# Patient Record
Sex: Male | Born: 1950
Health system: Southern US, Community
[De-identification: ages and names within clinical notes are randomized; demographics above are authoritative.]

## PROBLEM LIST (undated history)

## (undated) DIAGNOSIS — Z860100 Personal history of colon polyps, unspecified: Secondary | ICD-10-CM

## (undated) DIAGNOSIS — D649 Anemia, unspecified: Secondary | ICD-10-CM

## (undated) DIAGNOSIS — C61 Malignant neoplasm of prostate: Secondary | ICD-10-CM

## (undated) DIAGNOSIS — M87180 Osteonecrosis due to drugs, jaw: Secondary | ICD-10-CM

## (undated) DIAGNOSIS — F32A Depression, unspecified: Secondary | ICD-10-CM

## (undated) DIAGNOSIS — I4891 Unspecified atrial fibrillation: Secondary | ICD-10-CM

## (undated) DIAGNOSIS — K7011 Alcoholic hepatitis with ascites: Principal | ICD-10-CM

## (undated) DIAGNOSIS — F1020 Alcohol dependence, uncomplicated: Secondary | ICD-10-CM

## (undated) DIAGNOSIS — K7010 Alcoholic hepatitis without ascites: Secondary | ICD-10-CM

## (undated) DIAGNOSIS — J189 Pneumonia, unspecified organism: Secondary | ICD-10-CM

## (undated) DIAGNOSIS — F329 Major depressive disorder, single episode, unspecified: Secondary | ICD-10-CM

## (undated) DIAGNOSIS — B9620 Unspecified Escherichia coli [E. coli] as the cause of diseases classified elsewhere: Secondary | ICD-10-CM

## (undated) DIAGNOSIS — I499 Cardiac arrhythmia, unspecified: Secondary | ICD-10-CM

## (undated) DIAGNOSIS — K7030 Alcoholic cirrhosis of liver without ascites: Secondary | ICD-10-CM

## (undated) DIAGNOSIS — G250 Essential tremor: Secondary | ICD-10-CM

## (undated) DIAGNOSIS — F411 Generalized anxiety disorder: Secondary | ICD-10-CM

## (undated) DIAGNOSIS — I2510 Atherosclerotic heart disease of native coronary artery without angina pectoris: Secondary | ICD-10-CM

## (undated) DIAGNOSIS — G252 Other specified forms of tremor: Secondary | ICD-10-CM

## (undated) DIAGNOSIS — I10 Essential (primary) hypertension: Secondary | ICD-10-CM

## (undated) DIAGNOSIS — K767 Hepatorenal syndrome: Secondary | ICD-10-CM

## (undated) DIAGNOSIS — E785 Hyperlipidemia, unspecified: Secondary | ICD-10-CM

## (undated) DIAGNOSIS — R7881 Bacteremia: Secondary | ICD-10-CM

## (undated) DIAGNOSIS — R972 Elevated prostate specific antigen [PSA]: Secondary | ICD-10-CM

## (undated) DIAGNOSIS — T458X5A Adverse effect of other primarily systemic and hematological agents, initial encounter: Secondary | ICD-10-CM

## (undated) DIAGNOSIS — Z800 Family history of malignant neoplasm of digestive organs: Secondary | ICD-10-CM

## (undated) HISTORY — DX: Essential (primary) hypertension: I10

## (undated) HISTORY — DX: Hypocalcemia: E83.51

## (undated) HISTORY — DX: Unspecified Escherichia coli (E. coli) as the cause of diseases classified elsewhere: B96.20

## (undated) HISTORY — DX: Alcohol dependence, uncomplicated: F10.20

## (undated) HISTORY — PX: COLONOSCOPY W/ BIOPSIES: SHX1374

## (undated) HISTORY — DX: Bacteremia: R78.81

## (undated) HISTORY — DX: Alcoholic hepatitis with ascites: K70.11

## (undated) HISTORY — PX: VASECTOMY: SHX75

## (undated) HISTORY — DX: Alcoholic hepatitis without ascites: K70.10

## (undated) HISTORY — DX: Personal history of colonic polyps: Z86.010

## (undated) HISTORY — DX: Family history of malignant neoplasm of digestive organs: Z80.0

## (undated) HISTORY — DX: Essential tremor: G25.0

## (undated) HISTORY — DX: Generalized anxiety disorder: F41.1

## (undated) HISTORY — DX: Unspecified atrial fibrillation: I48.91

## (undated) HISTORY — DX: Alcoholic cirrhosis of liver without ascites: K70.30

## (undated) HISTORY — PX: CARDIAC CATHETERIZATION: SHX172

## (undated) HISTORY — DX: Hyperlipidemia, unspecified: E78.5

## (undated) HISTORY — PX: CHOLECYSTECTOMY: SHX55

## (undated) HISTORY — DX: Elevated prostate specific antigen (PSA): R97.20

## (undated) HISTORY — DX: Adverse effect of other primarily systemic and hematological agents, initial encounter: T45.8X5A

## (undated) HISTORY — DX: Personal history of colon polyps, unspecified: Z86.0100

## (undated) HISTORY — DX: Osteonecrosis due to drugs, jaw: M87.180

## (undated) HISTORY — DX: Other specified forms of tremor: G25.2

## (undated) HISTORY — DX: Malignant neoplasm of prostate: C61

## (undated) SURGERY — Surgical Case
Anesthesia: *Unknown

---

## 2001-05-29 ENCOUNTER — Encounter (INDEPENDENT_AMBULATORY_CARE_PROVIDER_SITE_OTHER): Payer: Self-pay | Admitting: Specialist

## 2001-05-29 ENCOUNTER — Inpatient Hospital Stay (HOSPITAL_COMMUNITY): Admission: EM | Admit: 2001-05-29 | Discharge: 2001-06-03 | Payer: Self-pay | Admitting: Emergency Medicine

## 2001-05-29 ENCOUNTER — Encounter: Payer: Self-pay | Admitting: Emergency Medicine

## 2001-05-29 ENCOUNTER — Encounter: Payer: Self-pay | Admitting: *Deleted

## 2001-05-31 ENCOUNTER — Encounter: Payer: Self-pay | Admitting: Surgery

## 2007-10-16 ENCOUNTER — Ambulatory Visit: Payer: Self-pay | Admitting: Gastroenterology

## 2007-10-30 ENCOUNTER — Encounter: Payer: Self-pay | Admitting: Gastroenterology

## 2007-10-30 ENCOUNTER — Ambulatory Visit: Payer: Self-pay | Admitting: Gastroenterology

## 2007-10-30 LAB — CONVERTED CEMR LAB
ALT: 32 units/L (ref 0–53)
AST: 62 units/L — ABNORMAL HIGH (ref 0–37)
Albumin: 3.9 g/dL (ref 3.5–5.2)
Alkaline Phosphatase: 48 units/L (ref 39–117)
Amylase: 50 units/L (ref 27–131)
Basophils Absolute: 0.1 10*3/uL (ref 0.0–0.1)
Basophils Relative: 1 % (ref 0.0–1.0)
CO2: 27 meq/L (ref 19–32)
Calcium: 8.3 mg/dL — ABNORMAL LOW (ref 8.4–10.5)
Chloride: 103 meq/L (ref 96–112)
Eosinophils Absolute: 0.2 10*3/uL (ref 0.0–0.6)
GFR calc Af Amer: 111 mL/min
GFR calc non Af Amer: 92 mL/min
Glucose, Bld: 79 mg/dL (ref 70–99)
HCT: 43 % (ref 39.0–52.0)
Iron: 112 ug/dL (ref 42–165)
Lipase: 21 units/L (ref 11.0–59.0)
Lymphocytes Relative: 18.3 % (ref 12.0–46.0)
Monocytes Absolute: 0.3 10*3/uL (ref 0.2–0.7)
Monocytes Relative: 6.5 % (ref 3.0–11.0)
Neutrophils Relative %: 69.7 % (ref 43.0–77.0)
Platelets: 196 10*3/uL (ref 150–400)
Potassium: 3.7 meq/L (ref 3.5–5.1)
Saturation Ratios: 31.8 % (ref 20.0–50.0)
Sed Rate: 17 mm/hr (ref 0–20)
Sodium: 138 meq/L (ref 135–145)
Total Bilirubin: 1.3 mg/dL — ABNORMAL HIGH (ref 0.3–1.2)
Total Protein: 7 g/dL (ref 6.0–8.3)
Transferrin: 251.5 mg/dL (ref >=212.0)

## 2007-11-06 DIAGNOSIS — C61 Malignant neoplasm of prostate: Secondary | ICD-10-CM

## 2007-11-06 HISTORY — DX: Malignant neoplasm of prostate: C61

## 2007-11-07 ENCOUNTER — Ambulatory Visit: Payer: Self-pay | Admitting: Gastroenterology

## 2007-11-07 LAB — CONVERTED CEMR LAB: HCV Ab: NEGATIVE

## 2007-11-13 ENCOUNTER — Emergency Department (HOSPITAL_COMMUNITY): Admission: EM | Admit: 2007-11-13 | Discharge: 2007-11-13 | Payer: Self-pay | Admitting: Emergency Medicine

## 2007-11-30 ENCOUNTER — Emergency Department (HOSPITAL_COMMUNITY): Admission: EM | Admit: 2007-11-30 | Discharge: 2007-11-30 | Payer: Self-pay | Admitting: Emergency Medicine

## 2007-12-02 ENCOUNTER — Ambulatory Visit: Payer: Self-pay | Admitting: Gastroenterology

## 2007-12-02 ENCOUNTER — Inpatient Hospital Stay (HOSPITAL_COMMUNITY): Admission: EM | Admit: 2007-12-02 | Discharge: 2007-12-05 | Payer: Self-pay | Admitting: Emergency Medicine

## 2007-12-03 ENCOUNTER — Ambulatory Visit: Payer: Self-pay | Admitting: Psychiatry

## 2008-02-10 DIAGNOSIS — I10 Essential (primary) hypertension: Secondary | ICD-10-CM

## 2008-02-11 ENCOUNTER — Ambulatory Visit: Payer: Self-pay | Admitting: Gastroenterology

## 2008-02-11 LAB — CONVERTED CEMR LAB
ALT: 18 units/L (ref 0–53)
AST: 23 units/L (ref 0–37)
Albumin: 3.6 g/dL (ref 3.5–5.2)
Alkaline Phosphatase: 63 units/L (ref 39–117)
Ammonia: 28 umol/L (ref 11–35)
BUN: 15 mg/dL (ref 6–23)
Basophils Relative: 0.7 % (ref 0.0–1.0)
Bilirubin, Direct: 0.1 mg/dL (ref 0.0–0.3)
Creatinine, Ser: 0.9 mg/dL (ref 0.4–1.5)
Eosinophils Relative: 3 % (ref 0.0–5.0)
GFR calc non Af Amer: 92 mL/min
Glucose, Bld: 99 mg/dL (ref 70–99)
Lymphocytes Relative: 19.3 % (ref 12.0–46.0)
Monocytes Relative: 5 % (ref 3.0–12.0)
Neutro Abs: 4.7 10*3/uL (ref 1.4–7.7)
Platelets: 200 10*3/uL (ref 150–400)
Total Bilirubin: 1 mg/dL (ref 0.3–1.2)

## 2008-03-24 ENCOUNTER — Ambulatory Visit: Payer: Self-pay | Admitting: Gastroenterology

## 2008-03-24 ENCOUNTER — Telehealth: Payer: Self-pay | Admitting: Gastroenterology

## 2008-03-24 LAB — CONVERTED CEMR LAB
AST: 26 units/L (ref 0–37)
Alkaline Phosphatase: 63 units/L (ref 39–117)
Total Bilirubin: 0.9 mg/dL (ref 0.3–1.2)
Total Protein: 7.3 g/dL (ref 6.0–8.3)

## 2008-05-05 ENCOUNTER — Ambulatory Visit: Payer: Self-pay | Admitting: Gastroenterology

## 2008-05-05 LAB — CONVERTED CEMR LAB: PSA: 52.04 ng/mL — ABNORMAL HIGH (ref 0.10–4.00)

## 2008-05-06 ENCOUNTER — Encounter: Payer: Self-pay | Admitting: Gastroenterology

## 2008-05-12 ENCOUNTER — Ambulatory Visit (HOSPITAL_COMMUNITY): Admission: RE | Admit: 2008-05-12 | Discharge: 2008-05-12 | Payer: Self-pay | Admitting: Gastroenterology

## 2008-05-29 ENCOUNTER — Ambulatory Visit: Payer: Self-pay | Admitting: Gastroenterology

## 2008-05-29 ENCOUNTER — Telehealth: Payer: Self-pay | Admitting: Gastroenterology

## 2008-06-12 ENCOUNTER — Encounter: Payer: Self-pay | Admitting: Gastroenterology

## 2008-06-16 ENCOUNTER — Ambulatory Visit: Payer: Self-pay | Admitting: Gastroenterology

## 2008-06-23 ENCOUNTER — Encounter: Payer: Self-pay | Admitting: Gastroenterology

## 2008-06-26 ENCOUNTER — Encounter: Payer: Self-pay | Admitting: Gastroenterology

## 2008-06-26 ENCOUNTER — Ambulatory Visit (HOSPITAL_COMMUNITY): Admission: RE | Admit: 2008-06-26 | Discharge: 2008-06-26 | Payer: Self-pay | Admitting: Urology

## 2008-06-29 ENCOUNTER — Encounter: Payer: Self-pay | Admitting: Gastroenterology

## 2008-07-01 ENCOUNTER — Ambulatory Visit (HOSPITAL_COMMUNITY): Admission: RE | Admit: 2008-07-01 | Discharge: 2008-07-01 | Payer: Self-pay | Admitting: Urology

## 2008-07-06 ENCOUNTER — Encounter: Payer: Self-pay | Admitting: Gastroenterology

## 2008-07-07 ENCOUNTER — Encounter: Payer: Self-pay | Admitting: Gastroenterology

## 2008-07-08 ENCOUNTER — Telehealth: Payer: Self-pay | Admitting: Internal Medicine

## 2008-07-14 ENCOUNTER — Ambulatory Visit: Payer: Self-pay | Admitting: Oncology

## 2008-07-14 ENCOUNTER — Telehealth (INDEPENDENT_AMBULATORY_CARE_PROVIDER_SITE_OTHER): Payer: Self-pay | Admitting: *Deleted

## 2008-07-20 ENCOUNTER — Encounter: Payer: Self-pay | Admitting: Gastroenterology

## 2008-07-20 LAB — CBC WITH DIFFERENTIAL/PLATELET
BASO%: 0.7 % (ref 0.0–2.0)
EOS%: 7 % (ref 0.0–7.0)
HCT: 38.7 % (ref 38.7–49.9)
MCH: 34.2 pg — ABNORMAL HIGH (ref 28.0–33.4)
MCHC: 35.4 g/dL (ref 32.0–35.9)
MONO#: 0.6 10*3/uL (ref 0.1–0.9)
NEUT%: 57.6 % (ref 40.0–75.0)
RDW: 13.1 % (ref 11.2–14.6)
WBC: 7.6 10*3/uL (ref 4.0–10.0)
lymph#: 2.1 10*3/uL (ref 0.9–3.3)

## 2008-07-20 LAB — COMPREHENSIVE METABOLIC PANEL
ALT: 22 U/L (ref 0–53)
AST: 26 U/L (ref 0–37)
Alkaline Phosphatase: 66 U/L (ref 39–117)
CO2: 22 mEq/L (ref 19–32)
Creatinine, Ser: 0.91 mg/dL (ref 0.40–1.50)
Sodium: 142 mEq/L (ref 135–145)
Total Bilirubin: 0.5 mg/dL (ref 0.3–1.2)

## 2008-07-20 LAB — LACTATE DEHYDROGENASE: LDH: 179 U/L (ref 94–250)

## 2008-08-10 ENCOUNTER — Encounter: Payer: Self-pay | Admitting: Gastroenterology

## 2008-08-12 ENCOUNTER — Encounter (INDEPENDENT_AMBULATORY_CARE_PROVIDER_SITE_OTHER): Payer: Self-pay | Admitting: *Deleted

## 2008-08-14 ENCOUNTER — Ambulatory Visit: Payer: Self-pay | Admitting: Gastroenterology

## 2008-09-11 ENCOUNTER — Ambulatory Visit: Payer: Self-pay | Admitting: Oncology

## 2008-10-02 ENCOUNTER — Encounter: Payer: Self-pay | Admitting: Gastroenterology

## 2008-10-02 LAB — COMPREHENSIVE METABOLIC PANEL
Albumin: 4.2 g/dL (ref 3.5–5.2)
Alkaline Phosphatase: 57 U/L (ref 39–117)
BUN: 17 mg/dL (ref 6–23)
CO2: 24 mEq/L (ref 19–32)
Glucose, Bld: 98 mg/dL (ref 70–99)
Potassium: 3.7 mEq/L (ref 3.5–5.3)
Total Bilirubin: 0.8 mg/dL (ref 0.3–1.2)
Total Protein: 7.5 g/dL (ref 6.0–8.3)

## 2008-10-02 LAB — CBC WITH DIFFERENTIAL/PLATELET
Basophils Absolute: 0 10*3/uL (ref 0.0–0.1)
Eosinophils Absolute: 0.3 10*3/uL (ref 0.0–0.5)
HGB: 13.9 g/dL (ref 13.0–17.1)
LYMPH%: 31.7 % (ref 14.0–49.0)
MCV: 97.1 fL (ref 79.3–98.0)
MONO#: 0.5 10*3/uL (ref 0.1–0.9)
MONO%: 7.9 % (ref 0.0–14.0)
NEUT#: 3.3 10*3/uL (ref 1.5–6.5)
Platelets: 223 10*3/uL (ref 140–400)
WBC: 6 10*3/uL (ref 4.0–10.3)

## 2008-10-02 LAB — LACTATE DEHYDROGENASE: LDH: 162 U/L (ref 94–250)

## 2008-10-03 LAB — PSA: PSA: 0.62 ng/mL (ref 0.10–4.00)

## 2008-10-05 ENCOUNTER — Encounter: Payer: Self-pay | Admitting: Gastroenterology

## 2008-10-15 ENCOUNTER — Ambulatory Visit: Payer: Self-pay | Admitting: Gastroenterology

## 2008-10-16 ENCOUNTER — Telehealth (INDEPENDENT_AMBULATORY_CARE_PROVIDER_SITE_OTHER): Payer: Self-pay | Admitting: *Deleted

## 2008-10-20 ENCOUNTER — Encounter (INDEPENDENT_AMBULATORY_CARE_PROVIDER_SITE_OTHER): Payer: Self-pay | Admitting: *Deleted

## 2008-12-08 ENCOUNTER — Encounter: Payer: Self-pay | Admitting: Gastroenterology

## 2008-12-24 ENCOUNTER — Encounter: Payer: Self-pay | Admitting: Gastroenterology

## 2008-12-29 ENCOUNTER — Ambulatory Visit: Payer: Self-pay | Admitting: Cardiovascular Disease

## 2008-12-29 ENCOUNTER — Ambulatory Visit: Payer: Self-pay | Admitting: Gastroenterology

## 2008-12-29 ENCOUNTER — Emergency Department (HOSPITAL_COMMUNITY): Admission: EM | Admit: 2008-12-29 | Discharge: 2008-12-29 | Payer: Self-pay | Admitting: Emergency Medicine

## 2008-12-29 DIAGNOSIS — I4891 Unspecified atrial fibrillation: Secondary | ICD-10-CM | POA: Insufficient documentation

## 2009-01-11 ENCOUNTER — Ambulatory Visit: Payer: Self-pay | Admitting: Cardiovascular Disease

## 2009-01-11 ENCOUNTER — Ambulatory Visit: Payer: Self-pay | Admitting: Cardiology

## 2009-01-11 ENCOUNTER — Inpatient Hospital Stay (HOSPITAL_COMMUNITY): Admission: EM | Admit: 2009-01-11 | Discharge: 2009-01-15 | Payer: Self-pay | Admitting: Emergency Medicine

## 2009-01-11 ENCOUNTER — Ambulatory Visit: Payer: Self-pay

## 2009-01-12 ENCOUNTER — Encounter: Payer: Self-pay | Admitting: Cardiology

## 2009-01-13 ENCOUNTER — Encounter: Payer: Self-pay | Admitting: Cardiology

## 2009-01-18 ENCOUNTER — Ambulatory Visit: Payer: Self-pay | Admitting: Cardiology

## 2009-01-21 ENCOUNTER — Ambulatory Visit: Payer: Self-pay | Admitting: Internal Medicine

## 2009-01-21 LAB — CONVERTED CEMR LAB
POC INR: 3
Protime: 20.8

## 2009-01-22 DIAGNOSIS — E785 Hyperlipidemia, unspecified: Secondary | ICD-10-CM | POA: Insufficient documentation

## 2009-01-27 ENCOUNTER — Encounter: Payer: Self-pay | Admitting: Gastroenterology

## 2009-02-02 ENCOUNTER — Ambulatory Visit: Payer: Self-pay | Admitting: Oncology

## 2009-02-02 LAB — MORPHOLOGY

## 2009-02-03 ENCOUNTER — Ambulatory Visit: Payer: Self-pay | Admitting: Internal Medicine

## 2009-02-03 ENCOUNTER — Encounter: Payer: Self-pay | Admitting: Nurse Practitioner

## 2009-02-11 ENCOUNTER — Ambulatory Visit: Payer: Self-pay | Admitting: Internal Medicine

## 2009-02-11 LAB — CONVERTED CEMR LAB
POC INR: 7.9
Prothrombin Time: 33.8 s

## 2009-02-12 ENCOUNTER — Encounter: Payer: Self-pay | Admitting: Gastroenterology

## 2009-02-16 ENCOUNTER — Encounter (INDEPENDENT_AMBULATORY_CARE_PROVIDER_SITE_OTHER): Payer: Self-pay | Admitting: Cardiology

## 2009-02-16 ENCOUNTER — Ambulatory Visit: Payer: Self-pay | Admitting: Cardiology

## 2009-02-16 LAB — CONVERTED CEMR LAB

## 2009-03-05 ENCOUNTER — Telehealth: Payer: Self-pay | Admitting: Gastroenterology

## 2009-03-08 ENCOUNTER — Ambulatory Visit: Payer: Self-pay | Admitting: Internal Medicine

## 2009-03-08 LAB — CONVERTED CEMR LAB: Prothrombin Time: 33.9 s

## 2009-03-15 ENCOUNTER — Ambulatory Visit: Payer: Self-pay | Admitting: Cardiology

## 2009-03-15 ENCOUNTER — Encounter (INDEPENDENT_AMBULATORY_CARE_PROVIDER_SITE_OTHER): Payer: Self-pay | Admitting: Cardiology

## 2009-03-23 ENCOUNTER — Encounter: Payer: Self-pay | Admitting: Gastroenterology

## 2009-03-23 ENCOUNTER — Encounter: Payer: Self-pay | Admitting: Cardiology

## 2009-03-24 ENCOUNTER — Ambulatory Visit: Payer: Self-pay | Admitting: Cardiology

## 2009-03-25 ENCOUNTER — Ambulatory Visit: Payer: Self-pay | Admitting: Gastroenterology

## 2009-03-26 LAB — CONVERTED CEMR LAB
Ferritin: 504.8 ng/mL — ABNORMAL HIGH (ref 22.0–322.0)
Folate: >20 ng/mL
Iron: 79 ug/dL (ref 42–165)
Saturation Ratios: 22.3 % (ref 20.0–50.0)

## 2009-04-05 ENCOUNTER — Ambulatory Visit: Payer: Self-pay | Admitting: Gastroenterology

## 2009-04-05 LAB — CONVERTED CEMR LAB: Fecal Occult Bld: NEGATIVE

## 2009-06-03 ENCOUNTER — Ambulatory Visit (HOSPITAL_COMMUNITY): Admission: RE | Admit: 2009-06-03 | Discharge: 2009-06-03 | Payer: Self-pay | Admitting: Urology

## 2009-06-08 ENCOUNTER — Encounter: Payer: Self-pay | Admitting: Cardiology

## 2009-06-08 ENCOUNTER — Encounter: Payer: Self-pay | Admitting: Gastroenterology

## 2009-06-09 ENCOUNTER — Ambulatory Visit: Payer: Self-pay | Admitting: Cardiology

## 2009-06-23 ENCOUNTER — Ambulatory Visit: Payer: Self-pay | Admitting: Oncology

## 2009-06-25 ENCOUNTER — Encounter: Payer: Self-pay | Admitting: Gastroenterology

## 2009-07-23 ENCOUNTER — Telehealth (INDEPENDENT_AMBULATORY_CARE_PROVIDER_SITE_OTHER): Payer: Self-pay | Admitting: *Deleted

## 2009-08-07 HISTORY — PX: CORONARY STENT PLACEMENT: SHX1402

## 2009-09-15 ENCOUNTER — Encounter: Payer: Self-pay | Admitting: Cardiology

## 2009-09-15 ENCOUNTER — Encounter: Payer: Self-pay | Admitting: Gastroenterology

## 2009-09-21 ENCOUNTER — Telehealth: Payer: Self-pay | Admitting: Gastroenterology

## 2009-09-24 ENCOUNTER — Ambulatory Visit: Payer: Self-pay | Admitting: Gastroenterology

## 2009-12-03 ENCOUNTER — Encounter (INDEPENDENT_AMBULATORY_CARE_PROVIDER_SITE_OTHER): Payer: Self-pay | Admitting: *Deleted

## 2009-12-23 ENCOUNTER — Ambulatory Visit: Payer: Self-pay | Admitting: Oncology

## 2010-01-17 ENCOUNTER — Encounter: Payer: Self-pay | Admitting: Gastroenterology

## 2010-01-17 LAB — CBC WITH DIFFERENTIAL/PLATELET
BASO%: 0.4 % (ref 0.0–2.0)
EOS%: 5.9 % (ref 0.0–7.0)
HGB: 13 g/dL (ref 13.0–17.1)
MCH: 35.2 pg — ABNORMAL HIGH (ref 27.2–33.4)
MCHC: 35.5 g/dL (ref 32.0–36.0)
RBC: 3.7 10*6/uL — ABNORMAL LOW (ref 4.20–5.82)
RDW: 13.4 % (ref 11.0–14.6)
lymph#: 2.1 10*3/uL (ref 0.9–3.3)

## 2010-01-17 LAB — COMPREHENSIVE METABOLIC PANEL
AST: 53 U/L — ABNORMAL HIGH (ref 0–37)
Albumin: 4.3 g/dL (ref 3.5–5.2)
Alkaline Phosphatase: 56 U/L (ref 39–117)
Potassium: 3.8 mEq/L (ref 3.5–5.3)
Sodium: 138 mEq/L (ref 135–145)
Total Bilirubin: 0.6 mg/dL (ref 0.3–1.2)
Total Protein: 7.4 g/dL (ref 6.0–8.3)

## 2010-01-21 ENCOUNTER — Encounter: Payer: Self-pay | Admitting: Gastroenterology

## 2010-01-31 ENCOUNTER — Telehealth: Payer: Self-pay | Admitting: Gastroenterology

## 2010-02-22 ENCOUNTER — Ambulatory Visit: Payer: Self-pay | Admitting: Gastroenterology

## 2010-03-02 ENCOUNTER — Emergency Department (HOSPITAL_COMMUNITY): Admission: EM | Admit: 2010-03-02 | Discharge: 2010-03-02 | Payer: Self-pay | Admitting: Emergency Medicine

## 2010-03-18 ENCOUNTER — Ambulatory Visit: Payer: Self-pay | Admitting: Cardiology

## 2010-03-18 ENCOUNTER — Inpatient Hospital Stay (HOSPITAL_COMMUNITY): Admission: AD | Admit: 2010-03-18 | Discharge: 2010-03-22 | Payer: Self-pay | Admitting: Cardiology

## 2010-03-18 ENCOUNTER — Ambulatory Visit: Admission: RE | Admit: 2010-03-18 | Discharge: 2010-04-28 | Payer: Self-pay | Admitting: Radiation Oncology

## 2010-03-18 ENCOUNTER — Ambulatory Visit: Payer: Self-pay | Admitting: Cardiovascular Disease

## 2010-03-21 ENCOUNTER — Encounter: Payer: Self-pay | Admitting: Cardiology

## 2010-03-30 ENCOUNTER — Encounter: Payer: Self-pay | Admitting: Cardiology

## 2010-03-31 ENCOUNTER — Telehealth (INDEPENDENT_AMBULATORY_CARE_PROVIDER_SITE_OTHER): Payer: Self-pay | Admitting: *Deleted

## 2010-04-12 ENCOUNTER — Encounter: Payer: Self-pay | Admitting: Cardiology

## 2010-04-14 ENCOUNTER — Encounter (HOSPITAL_COMMUNITY)
Admission: RE | Admit: 2010-04-14 | Discharge: 2010-05-06 | Payer: Self-pay | Source: Home / Self Care | Admitting: Cardiology

## 2010-04-14 ENCOUNTER — Telehealth: Payer: Self-pay | Admitting: Cardiology

## 2010-04-15 ENCOUNTER — Telehealth: Payer: Self-pay | Admitting: Cardiology

## 2010-04-15 ENCOUNTER — Encounter: Payer: Self-pay | Admitting: Cardiology

## 2010-04-18 ENCOUNTER — Encounter: Payer: Self-pay | Admitting: Cardiology

## 2010-04-19 ENCOUNTER — Telehealth: Payer: Self-pay | Admitting: Gastroenterology

## 2010-04-25 ENCOUNTER — Encounter: Payer: Self-pay | Admitting: Gastroenterology

## 2010-04-26 ENCOUNTER — Ambulatory Visit: Payer: Self-pay | Admitting: Cardiology

## 2010-04-26 DIAGNOSIS — I2510 Atherosclerotic heart disease of native coronary artery without angina pectoris: Secondary | ICD-10-CM

## 2010-04-27 ENCOUNTER — Encounter: Payer: Self-pay | Admitting: Cardiology

## 2010-04-29 ENCOUNTER — Ambulatory Visit: Payer: Self-pay | Admitting: Gastroenterology

## 2010-05-07 ENCOUNTER — Encounter (HOSPITAL_COMMUNITY)
Admission: RE | Admit: 2010-05-07 | Discharge: 2010-08-05 | Payer: Self-pay | Source: Home / Self Care | Attending: Cardiology | Admitting: Cardiology

## 2010-05-11 ENCOUNTER — Ambulatory Visit: Payer: Self-pay | Admitting: Cardiology

## 2010-05-12 ENCOUNTER — Encounter: Payer: Self-pay | Admitting: Cardiology

## 2010-05-16 LAB — CONVERTED CEMR LAB
ALT: 57 units/L — ABNORMAL HIGH (ref 0–53)
Albumin: 4.2 g/dL (ref 3.5–5.2)
Cholesterol: 200 mg/dL (ref 0–200)
Direct LDL: 95.6 mg/dL
HDL: 61.3 mg/dL (ref >39.00)
Total Bilirubin: 0.8 mg/dL (ref 0.3–1.2)
Total Protein: 7.4 g/dL (ref 6.0–8.3)
Triglycerides: 327 mg/dL — ABNORMAL HIGH (ref 0.0–149.0)

## 2010-06-16 ENCOUNTER — Ambulatory Visit: Payer: Self-pay | Admitting: Cardiology

## 2010-06-16 ENCOUNTER — Encounter: Payer: Self-pay | Admitting: Cardiology

## 2010-07-18 ENCOUNTER — Encounter: Payer: Self-pay | Admitting: Cardiology

## 2010-07-28 ENCOUNTER — Ambulatory Visit: Payer: Self-pay | Admitting: Oncology

## 2010-08-02 ENCOUNTER — Ambulatory Visit (HOSPITAL_COMMUNITY)
Admission: RE | Admit: 2010-08-02 | Discharge: 2010-08-02 | Payer: Self-pay | Source: Home / Self Care | Attending: Oncology | Admitting: Oncology

## 2010-08-02 ENCOUNTER — Encounter: Payer: Self-pay | Admitting: Gastroenterology

## 2010-08-02 ENCOUNTER — Ambulatory Visit: Payer: Self-pay | Admitting: Cardiology

## 2010-08-07 ENCOUNTER — Encounter (HOSPITAL_COMMUNITY)
Admission: RE | Admit: 2010-08-07 | Discharge: 2010-08-21 | Payer: Self-pay | Source: Home / Self Care | Attending: Cardiology | Admitting: Cardiology

## 2010-08-12 ENCOUNTER — Encounter: Payer: Self-pay | Admitting: Gastroenterology

## 2010-08-22 ENCOUNTER — Encounter (HOSPITAL_COMMUNITY)
Admission: RE | Admit: 2010-08-22 | Discharge: 2010-09-06 | Payer: Self-pay | Source: Home / Self Care | Attending: Cardiology | Admitting: Cardiology

## 2010-08-28 ENCOUNTER — Encounter: Payer: Self-pay | Admitting: Urology

## 2010-09-06 NOTE — Letter (Signed)
Summary: Colonoscopy Date Change Letter  [facility]  [address] [place], [place] [address]   Phone: [phone]  Fax: [phone]      December 03, 2009 MRN: [id number]   DAVID Eickholt [address] [place], [place]  [address]   Dear Mr. HUSAK,   Previously you were recommended to have a repeat colonoscopy around this time. Your chart was recently reviewed by Dr. Judie Petit T. Russella Dar of [facility]. Follow up colonoscopy is now recommended in May 2014. This revised recommendation is based on current, nationally recognized guidelines for colorectal cancer screening and polyp surveillance. These guidelines are endorsed by the American Cancer Society, [employer] on Colorectal Cancer as well as numerous other major medical organizations.  Please understand that our recommendation assumes that you do not have any new symptoms such as bleeding, a change in bowel habits, anemia, or significant abdominal discomfort. If you do have any concerning GI symptoms or want to discuss the guideline recommendations, please call to arrange an office visit at your earliest convenience. Otherwise we will keep you in our reminder system and contact you 1-2 months prior to the date listed above to schedule your next colonoscopy.  Thank you,  Judie Petit T. Russella Dar, M.D.  [facility] [phone]

## 2010-09-07 ENCOUNTER — Ambulatory Visit (HOSPITAL_COMMUNITY): Payer: 59 | Attending: Cardiology

## 2010-09-08 NOTE — Progress Notes (Signed)
   Walk in Patient Form Recieved " Pt Left Attending Physicians statement papers to be completed" sent to [facility]. Cala Bradford Mesiemore  March 31, 2010 3:08 PM    Appended Document:  Pt wife came in today picked up Disability packet to be completed she took back home with her, for Husband to complete, she also paid 25.00$ in cash for the forms to be completed I have forwarded the Payment to [employer].

## 2010-09-08 NOTE — Miscellaneous (Signed)
Summary: [facility]   [facility]   Imported By: Roderic Ovens 05/25/2010 15:53:38  _____________________________________________________________________  External Attachment:    Type:   Image     Comment:   External Document

## 2010-09-08 NOTE — Progress Notes (Signed)
Summary: same day cx   Phone Note Call from Patient   Caller: Patient Call For: Dr. Jarold Motto Reason for Call: Talk to Nurse Summary of Call: 1. pt cx'ed his appt for today b/c he was instructed to go to his dentist, Dr. Sharma Covert, asap... pt's jaw is hurting and doctors fear his cancer treatment may be "eating through his jawbone"... pt resch'ed for the 22nd... pt wanted Dr. Jarold Motto to know why he canceled and resch'ed  2. I need to officially know if Dr. Jarold Motto wants to charge this pt a same-day cx fee, $50 Initial call taken by: Vallarie Mare,  September 21, 2009 8:35 AM  Follow-up for Phone Call        no charge Follow-up by: Mardella Layman MD FACG,  September 21, 2009 8:48 AM  Additional Follow-up for Phone Call Additional follow up Details #1::        Patient NOT BILLED. Additional Follow-up by: Leanor Kail [facility],  October 01, 2009 3:44 PM

## 2010-09-08 NOTE — Miscellaneous (Signed)
Summary: [facility] Physician Order/Treatment Plan   [facility] Physician Order/Treatment Plan   Imported By: Roderic Ovens 04/26/2010 15:32:35  _____________________________________________________________________  External Attachment:    Type:   Image     Comment:   External Document

## 2010-09-08 NOTE — Assessment & Plan Note (Signed)
Summary: RX REFILL/AD    History of Present Illness Primary GI MD: Sheryn Bison MD FACP FAGA Primary Chezney Huether: Verner Mould Requesting Neyla Gauntt: n/a Chief Complaint: f/u medications and discuss recent stents placed. Denies any GI sx. History of Present Illness:   Allen Schroeder is completely asymptomatic in terms of any gastrointestinal issues. He has recently been hospitalized with recurrent atrial fibrillation, coronary artery disease requiring cardiac stent placement with Plavix and initiation, and he has stable metastatic bone disease recently evaluated by radiation oncology. I have prescribed Nucynta 100 mg every 6-8 hours for pain, and this seems to be working well. Calls mild iron deficiency also is on ferrous sulfate once a day, folic acid 1 mg a day, and multivitamins. He continues to use alcohol daily but denies problems with intoxication or bad social consequences. He and his wife seem to be doing fairly well.   GI Review of Systems      Denies abdominal pain, acid reflux, belching, bloating, chest pain, dysphagia with liquids, dysphagia with solids, heartburn, loss of appetite, nausea, vomiting, vomiting blood, weight loss, and  weight gain.        Denies anal fissure, black tarry stools, change in bowel habit, constipation, diarrhea, diverticulosis, fecal incontinence, heme positive stool, hemorrhoids, irritable bowel syndrome, jaundice, light color stool, liver problems, rectal bleeding, and  rectal pain.    Current Medications (verified): 1)  Centrum  Tabs (Multiple Vitamins-Minerals) .... Take One By Mouth Once Daily 2)  Folic Acid 1 Mg  Tabs (Folic Acid) .... One By Mouth Once Daily 3)  Metoprolol Succinate 25 Mg Xr24h-Tab (Metoprolol Succinate) .[name].. 1 Tab Once Daily 4)  Vitamin D3 2000 Unit Caps (Cholecalciferol) .[name].. 1 Cap Once Daily 5)  Calcium 500/d 500-200 Mg-Unit Tabs (Calcium Carbonate-Vitamin D) .[name].. 1 By Mouth Once Daily 6)  Simvastatin 40 Mg Tabs  (Simvastatin) .... Take One Tablet By Mouth Daily At Bedtime 7)  Zometa 4 Mg/97ml Conc (Zoledronic Acid) .... Monthly 8)  Aspirin 325 Mg Tabs (Aspirin) .... Take 1 Tablet Daily 9)  Nucynta 100 Mg Tabs (Tapentadol Hcl) .[name].. 1 By Mouth Q 8 Hrs As Needed 10)  Ambien Cr 12.5 Mg Cr-Tabs (Zolpidem Tartrate) .[name].. 1 Tab At Bedtime As Needed 11)  Benadryl 25 Mg Tabs (Diphenhydramine Hcl) .[name].. 1 Tab As Needed 12)  Ferrous Sulfate 325 (65 Fe) Mg Tbec (Ferrous Sulfate) .[name].. 1 Tab Once Daily 13)  Sotalol Hcl 120 Mg Tabs (Sotalol Hcl) .[name].. 1 Tab Two Times A Day 14)  Plavix 75 Mg Tabs (Clopidogrel Bisulfate) .... One Tablet By Mouth Once Daily  Allergies (verified): No Known Drug Allergies  Past History:  Past medical, surgical, family and social histories (including risk factors) reviewed for relevance to current acute and chronic problems.  Past Medical History: Reviewed history from 02/03/2009 and no changes required. Current Problems:  ATRIAL FIBRILLATION (ICD-427.31)      a.  s/p TEE/DCCV 01/13/2009      b.  Flecainide initiated 01/13/2009 HYPERLIPIDEMIA (ICD-272.4) ESSENTIAL HYPERTENSION (ICD-401.9) METASTATIC PROSTATE CANCER (ICD-185) PROSTATE SPECIFIC ANTIGEN, ELEVATED (ICD-790.93) COLONIC POLYPS, HX OF (ICD-V12.72) DIVERTICULOSIS, COLON (ICD-562.10) HYPOCALCEMIA (ICD-275.41) ALCOHOLIC HEPATITIS (ICD-571.1) FAMILIAL TREMOR (ICD-333.1) ANXIETY (ICD-300.00) ALCOHOL ABUSE (ICD-305.00)    Past Surgical History: Cholecystectomy Dr. Gerrit Friends 2002 vasectomy Drug eluting stent placed  Family History: Reviewed history from 01/22/2009 and no changes required.  Mother died at age 45 of complications of AAA.  Father   is alive at age 28, has a history of prostate cancer and has a history  of coronary disease treated with angioplasty.  The patient has 2   siblings, a twin brother and an older brother, neither whom is known to   have heart disease or prostate problems.   Social  History: Reviewed history from 09/24/2009 and no changes required. 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 Patient currently smokes.  1 cigg every 3 days Alcohol Use - Has consumed as much as 1/2 gallon vodka/day in past, currently at least 3 drinks/day Illicit Drug Use - no  Review of Systems  The patient denies allergy/sinus, anemia, anxiety-new, arthritis/joint pain, back pain, blood in urine, breast changes/lumps, change in vision, confusion, cough, coughing up blood, depression-new, fainting, fatigue, fever, headaches-new, hearing problems, heart murmur, heart rhythm changes, itching, menstrual pain, muscle pains/cramps, night sweats, nosebleeds, pregnancy symptoms, shortness of breath, skin rash, sleeping problems, sore throat, swelling of feet/legs, swollen lymph glands, thirst - excessive , urination - excessive , urination changes/pain, urine leakage, vision changes, and voice change.    Vital Signs:  Patient profile:   60 year old male Height:      74 inches Weight:      217.25 pounds BMI:     27.99 Pulse rate:   78 / minute Pulse rhythm:   regular BP sitting:   106 / 64  (left arm) Cuff size:   regular  Vitals Entered By: Christie Nottingham CMA Duncan Dull) (April 29, 2010 11:12 AM)  Physical Exam  General:  Well developed, well nourished, no acute distress.healthy appearing.  No stigma of chronic liver disease noted Head:  Normocephalic and atraumatic. Eyes:  PERRLA, no icterus.exam deferred to patient's ophthalmologist.   Lungs:  Clear throughout to auscultation. Heart:  Regular rate and rhythm; no murmurs, rubs,  or bruits.Regular rhythm at this time. Abdomen:  I cannot appreciate hepatosplenomegaly, other abdominal masses, tenderness, or evidence of ascites. Bowel sounds are normal Msk:  Symmetrical with no gross deformities. Normal posture. Extremities:  No clubbing, cyanosis, edema or  deformities noted. Neurologic:  Alert and  oriented x4;  grossly normal neurologically. Psych:  Alert and cooperative. Normal mood and affect.   Impression & Recommendations:  Problem # 1:  ALCOHOLIC HEPATITIS (ICD-571.1) Assessment Improved Continue all medications as listed and reviewed in his record. I see no need for repeat liver function tests at this time. Patient continues to use alcohol regularly but has not had to my knowledge interaction of his alcohol with his other medications. He actually has done extremely well in face of multiple complicated and incurable medical problems. I've encouraged him to be as active as possible with continued social or action and alcohol moderation. He will continue to be followed by cardiology, oncology, and urology.  Problem # 2:  DIVERTICULOSIS, COLON (ICD-562.10) Assessment: Unchanged he is up-to-date on his endoscopy and colonoscopy exams.  Patient Instructions: 1)  Copy sent to : Sigmond Tannanbaum,MD, Dr. Valera Castle, Dr. Cephas Darby 2)  Please continue current medications.  3)  The medication list was reviewed and reconciled.  All changed / newly prescribed medications were explained.  A complete medication list was provided to the patient / caregiver. 4)  Please schedule a follow-up appointment in 6 months.

## 2010-09-08 NOTE — Letter (Signed)
Summary: Cephas Darby MD/[facility]   Cephas Darby MD/[facility]   Imported By: Lester [place] 08/25/2010 08:55:27  _____________________________________________________________________  External Attachment:    Type:   Image     Comment:   External Document

## 2010-09-08 NOTE — Letter (Signed)
Summary: [facility]  [facility]   Imported By: Lester Adams 05/03/2010 13:22:51  _____________________________________________________________________  External Attachment:    Type:   Image     Comment:   External Document

## 2010-09-08 NOTE — Miscellaneous (Signed)
Summary: [facility] Progress Note   [facility]   Imported By: Roderic Ovens 05/12/2010 15:55:05  _____________________________________________________________________  External Attachment:    Type:   Image     Comment:   External Document

## 2010-09-08 NOTE — Assessment & Plan Note (Signed)
Summary: DISCUSS CDT LEVEL/YF    History of Present Illness Visit Type: Follow-up Visit Primary GI MD: Sheryn Bison MD FACP FAGA Primary Provider: Verner Mould Requesting Provider: n/a Chief Complaint: Discuss CDT level History of Present Illness:   Allen Schroeder has metastatic prostate cancer to his spine and continues with his alcoholism problems. He currently has acute pain in his low back going down his right leg unresponsive to 2 Aleve a day. He denies gastrointestinal issues or other general medical problems except for severe insomnia. He has had addiction problems in the past with benzos but not with narcotics that I am aware of. He is followed closely by Dr. Patsi Sears in urology and sees Dr. Cyndie Chime yearly and oncology. He is on hormonal therapy monthly. He also has mild hypertensive cardiovascular disease with recurrent atrial fibrillation managed with daily metaprolol andflecinide Per Dr. Valera Castle and cardiology. He denies any cardiovascular or pulmonary complaints at this time.  When asked specifically about his alcoholism, the patient did this he continues to drink rather heavily but has had no social consequences recently. He does not take PPI therapy and is on folic acid and multivitamins. He denies any neurovascular problems, melena, hematochezia, nausea vomiting, or hematemesis. Recent labs were reviewed from Dr. Patsi Sears and showed mild alcoholic hepatitis. Patient has not had jaundice, coagulopathy, encephalopathy, or ascites. He continues to work 8 hours a day and maintains a good relationship with his wife. He currently does not smoke cigarettes.   GI Review of Systems      Denies abdominal pain, acid reflux, belching, bloating, chest pain, dysphagia with liquids, dysphagia with solids, heartburn, loss of appetite, nausea, vomiting, vomiting blood, weight loss, and  weight gain.        Denies anal fissure, black tarry stools, change in bowel habit,  constipation, diarrhea, diverticulosis, fecal incontinence, heme positive stool, hemorrhoids, irritable bowel syndrome, jaundice, light color stool, liver problems, rectal bleeding, and  rectal pain.    Current Medications (verified): 1)  Centrum  Tabs (Multiple Vitamins-Minerals) .... Take One By Mouth Once Daily 2)  Folic Acid 1 Mg  Tabs (Folic Acid) .... One By Mouth Once Daily 3)  Metoprolol Succinate 50 Mg Xr24h-Tab (Metoprolol Succinate) .... Take One Tablet By Mouth Daily 4)  Vitamin D 1000 Unit Tabs (Cholecalciferol) .... Take One By Mouth Once Daily 5)  Calcium 500/d 500-200 Mg-Unit Tabs (Calcium Carbonate-Vitamin D) .[name].. 1 By Mouth Once Daily 6)  Flecainide Acetate 100 Mg Tabs (Flecainide Acetate) .... Take One Tablet By Mouth Every 12 Hours 7)  Simvastatin 40 Mg Tabs (Simvastatin) .... Take One Tablet By Mouth Daily At Bedtime 8)  Zometa 4 Mg/60ml Conc (Zoledronic Acid) .... Monthly 9)  Diovan 80 Mg Tabs (Valsartan) .... Take One By Mouth Once Daily 10)  Aspirin 325 Mg Tabs (Aspirin) .... Take 1 Tablet Daily 11)  Etodolac 400 Mg Tabs (Etodolac) .... Take 1 Tablet By Mouth Three Times A Day For Pain 12)  Hydrocodone-Acetaminophen 5-500 Mg Tabs (Hydrocodone-Acetaminophen) .... Take 1-2 Tablets Every 4-6 Hours As Needed For Pain 13)  Aciphex 20 Mg  Tbec (Rabeprazole Sodium) .... Take 1 Each Day 30 Minutes Before Meals  Allergies (verified): No Known Drug Allergies  Past History:  Past medical, surgical, family and social histories (including risk factors) reviewed for relevance to current acute and chronic problems.  Past Medical History: Reviewed history from 02/03/2009 and no changes required. Current Problems:  ATRIAL FIBRILLATION (ICD-427.31)      a.  s/p TEE/DCCV 01/13/2009  b.  Flecainide initiated 01/13/2009 HYPERLIPIDEMIA (ICD-272.4) ESSENTIAL HYPERTENSION (ICD-401.9) METASTATIC PROSTATE CANCER (ICD-185) PROSTATE SPECIFIC ANTIGEN, ELEVATED (ICD-790.93) COLONIC POLYPS,  HX OF (ICD-V12.72) DIVERTICULOSIS, COLON (ICD-562.10) HYPOCALCEMIA (ICD-275.41) ALCOHOLIC HEPATITIS (ICD-571.1) FAMILIAL TREMOR (ICD-333.1) ANXIETY (ICD-300.00) ALCOHOL ABUSE (ICD-305.00)    Past Surgical History: Reviewed history from 01/22/2009 and no changes required. Cholecystectomy Dr. Gerrit Friends 2002 vasectomy  Family History: Reviewed history from 01/22/2009 and no changes required.  Mother died at age 13 of complications of AAA.  Father   is alive at age 21, has a history of prostate cancer and has a history   of coronary disease treated with angioplasty.  The patient has 2   siblings, a twin brother and an older brother, neither whom is known to   have heart disease or prostate problems.   Social History: Reviewed history from 09/24/2009 and no changes required. 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 Patient currently smokes.  1 cigg every 3 days Alcohol Use - Has consumed as much as 1/2 gallon vodka/day in past, currently at least 3 drinks/day Illicit Drug Use - no  Review of Systems  The patient denies allergy/sinus, anemia, anxiety-new, arthritis/joint pain, back pain, blood in urine, breast changes/lumps, change in vision, confusion, cough, coughing up blood, depression-new, fainting, fatigue, fever, headaches-new, hearing problems, heart murmur, heart rhythm changes, itching, menstrual pain, muscle pains/cramps, night sweats, nosebleeds, pregnancy symptoms, shortness of breath, skin rash, sleeping problems, sore throat, swelling of feet/legs, swollen lymph glands, thirst - excessive , urination - excessive , urination changes/pain, urine leakage, vision changes, and voice change.   General:  Complains of sleep disorder; denies fever, chills, sweats, anorexia, fatigue, weakness, malaise, and weight loss; chronic refractory insomnia.. CV:  Complains of peripheral edema; denies chest pains,  angina, palpitations, syncope, dyspnea on exertion, orthopnea, PND, and claudication. Resp:  Complains of dyspnea with exercise; denies dyspnea at rest, cough, sputum, wheezing, coughing up blood, and pleurisy. GI:  Denies difficulty swallowing, pain on swallowing, nausea, indigestion/heartburn, vomiting, vomiting blood, abdominal pain, jaundice, gas/bloating, diarrhea, constipation, change in bowel habits, bloody BM's, black BMs, and fecal incontinence. GU:  Denies urinary burning, blood in urine, urinary frequency, urinary hesitancy, nocturnal urination, urinary incontinence, penile discharge, genital sores, decreased libido, and erectile dysfunction. MS:  Complains of low back pain; severe back pain with right leg radiculopathy in the sciatic distribution.. Derm:  Denies rash, itching, dry skin, hives, moles, warts, and unhealing ulcers. Neuro:  Complains of radiculopathy other:; denies weakness, paralysis, abnormal sensation, seizures, syncope, tremors, vertigo, transient blindness, frequent falls, frequent headaches, difficulty walking, headache, sciatica, restless legs, memory loss, and confusion. Psych:  Denies depression, anxiety, memory loss, suicidal ideation, hallucinations, paranoia, phobia, and confusion. Endo:  Denies cold intolerance, heat intolerance, polydipsia, polyphagia, polyuria, unusual weight change, and hirsutism. Heme:  Denies bruising, bleeding, enlarged lymph nodes, and pagophagia.  Vital Signs:  Patient profile:   60 year old male Height:      74 inches Weight:      223.25 pounds BMI:     28.77 Pulse rate:   80 / minute Pulse rhythm:   regular BP sitting:   144 / 84  (right arm) Cuff size:   regular  Vitals Entered By: Christie Nottingham CMA Duncan Dull) (February 22, 2010 3:18 PM)  Physical Exam  General:  Well developed, well nourished, no acute distress.healthy appearing.  I cannot appreciate stigmata of chronic liver disease. Head:  Normocephalic and  atraumatic. Eyes:   PERRLA, no icterus.exam deferred to patient's ophthalmologist.   Neck:  Supple; no masses or thyromegaly. Lungs:  Clear throughout to auscultation. Heart:  Regular rate and rhythm; no murmurs, rubs,  or bruits. Abdomen:  Soft, nontender and nondistended. No masses, hepatosplenomegaly or hernias noted. Normal bowel sounds. Msk:  Symmetrical with no gross deformities. Normal posture. Pulses:  Normal pulses noted. Extremities:  trace pedal edema.  bruises bilaterally over the anterior tibial areas noted without evident phlebitis or cellulitis. Neurologic:  Alert and  oriented x4;  grossly normal neurologically. Psych:  Alert and cooperative. Normal mood and affect.   Impression & Recommendations:  Problem # 1:  ATRIAL FIBRILLATION (ICD-427.31) Assessment Improved Continue Cardiac Medications per Dr. Daleen Squibb. He is a well-controlled regular rhythm at this time.  Problem # 2:  ESSENTIAL HYPERTENSION (ICD-401.9) Assessment: Improved blood pressure normal today 144/84.  Problem # 3:  METASTATIC PROSTATE CANCER (ICD-185) Assessment: Unchanged Acute low back pain and what sounds like sciatic nerve irritation. Patient has known bony metastasis. I prescribed Nuycenta 10 mg every 8 hours as needed for pain as tolerated. I am concerned about his heavy NSAID use that PPI therapy with his history of cirrhosis and probable portal hypertension. He is to call next week for progress report on his back pain situation.  Problem # 4:  ALCOHOLIC HEPATITIS (ICD-571.1) Assessment: Unchanged No evidence of hepatomegaly or hepatic deterioration at this time with continued admitted moderate to heavy alcohol use. Patient has associated severe insomnia and I have given him some Ambien CR 12.5 mg to try at bedtime and have discussed with him his addictive potentials and the need to be aware of the additive nature of narcotics, sleeping pills, and alcohol.  Problem # 5:  ALCOHOL ABUSE (ICD-305.00) Assessment:  Unchanged  Patient Instructions: 1)  Begin Nyucenta as needed for pain. 2)  Begin Ambien at bedtime. 3)  The medication list was reviewed and reconciled.  All changed / newly prescribed medications were explained.  A complete medication list was provided to the patient / caregiver. 4)  Please call our GI Office back in 2 weeks with a follow-up of symptoms. 5)  Copy sent to : Dr. Valera Castle Dr. Patsi Sears in urology And Dr. Cyndie Chime in oncology 6)  Please continue current medications. Stop Aleve. Prescriptions: AMBIEN CR 12.5 MG CR-TABS (ZOLPIDEM TARTRATE) 1 q hs  #30 x 2   Entered by:   Ashok Cordia RN   Authorized by:   Mardella Layman MD [facility]   Signed by:   Ashok Cordia RN on 02/22/2010   Method used:   Print then Give to Patient   RxID:   [id number] NUCYNTA 100 MG TABS (TAPENTADOL HCL) 1 by mouth q 8 hrs as needed  #60 x 0   Entered by:   Ashok Cordia RN   Authorized by:   Mardella Layman MD [facility]   Signed by:   Ashok Cordia RN on 02/22/2010   Method used:   Print then Give to Patient   RxID:   [id number]

## 2010-09-08 NOTE — Assessment & Plan Note (Signed)
Summary: follow up/lk   History of Present Illness Visit Type: Follow-up Visit Primary GI MD: Sheryn Bison MD FACP FAGA Primary Provider: Verner Mould Requesting Provider: n/a Chief Complaint: pt denies any problems today, on antibiotics and pain med for root canal on 2/16 History of Present Illness:   Allen Schroeder denies any gastrointestinal or hepatic problems. His activated good and his weight is stable. He recently had a root canal performed by oral surgery and has been on aspirin and NSAIDs and has some mild nausea and dyspepsia. He denies melena, hematemesis, or any hepatobiliary complaints. He continues to drink rather heavily has had no episodes of known alcoholic hepatitis or complications from his cirrhosis. He has metastatic prostate cancer is on the care of urology and oncology. We treat his mild central hypertension with daily Diovan and also multivitamins and folic acid.   GI Review of Systems      Denies abdominal pain, acid reflux, belching, bloating, chest pain, dysphagia with liquids, dysphagia with solids, heartburn, loss of appetite, nausea, vomiting, vomiting blood, weight loss, and  weight gain.        Denies anal fissure, black tarry stools, change in bowel habit, constipation, diarrhea, diverticulosis, fecal incontinence, heme positive stool, hemorrhoids, irritable bowel syndrome, jaundice, light color stool, liver problems, rectal bleeding, and  rectal pain.    Current Medications (verified): 1)  Centrum  Tabs (Multiple Vitamins-Minerals) .... Take One By Mouth Once Daily 2)  Folic Acid 1 Mg  Tabs (Folic Acid) .... One By Mouth Once Daily 3)  Metoprolol Succinate 50 Mg Xr24h-Tab (Metoprolol Succinate) .... Take One Tablet By Mouth Daily 4)  Vitamin D 1000 Unit Tabs (Cholecalciferol) .... Take One By Mouth Once Daily 5)  Calcium 500/d 500-200 Mg-Unit Tabs (Calcium Carbonate-Vitamin D) .[name].. 1 By Mouth Once Daily 6)  Flecainide Acetate 100 Mg Tabs (Flecainide  Acetate) .... Take One Tablet By Mouth Every 12 Hours 7)  Simvastatin 40 Mg Tabs (Simvastatin) .... Take One Tablet By Mouth Daily At Bedtime 8)  Zometa 4 Mg/24ml Conc (Zoledronic Acid) .... Monthly 9)  Diovan 80 Mg Tabs (Valsartan) .... Take One By Mouth Once Daily 10)  Aspirin 325 Mg Tabs (Aspirin) .... Take 1 Tablet Daily 11)  Etodolac 400 Mg Tabs (Etodolac) .... Take 1 Tablet By Mouth Three Times A Day For Pain 12)  Hydrocodone-Acetaminophen 5-500 Mg Tabs (Hydrocodone-Acetaminophen) .... Take 1-2 Tablets Every 4-6 Hours As Needed For Pain 13)  Amoxicillin 500 Mg Caps (Amoxicillin) .... Take 1 Capsule By Mouth Three Times A Day  Allergies (verified): No Known Drug Allergies  Past History:  Past medical, surgical, family and social histories (including risk factors) reviewed for relevance to current acute and chronic problems.  Past Medical History: Reviewed history from 02/03/2009 and no changes required. Current Problems:  ATRIAL FIBRILLATION (ICD-427.31)      a.  s/p TEE/DCCV 01/13/2009      b.  Flecainide initiated 01/13/2009 HYPERLIPIDEMIA (ICD-272.4) ESSENTIAL HYPERTENSION (ICD-401.9) METASTATIC PROSTATE CANCER (ICD-185) PROSTATE SPECIFIC ANTIGEN, ELEVATED (ICD-790.93) COLONIC POLYPS, HX OF (ICD-V12.72) DIVERTICULOSIS, COLON (ICD-562.10) HYPOCALCEMIA (ICD-275.41) ALCOHOLIC HEPATITIS (ICD-571.1) FAMILIAL TREMOR (ICD-333.1) ANXIETY (ICD-300.00) ALCOHOL ABUSE (ICD-305.00)    Past Surgical History: Reviewed history from 01/22/2009 and no changes required. Cholecystectomy Dr. Gerrit Friends 2002 vasectomy  Family History: Reviewed history from 01/22/2009 and no changes required.  Mother died at age 2 of complications of AAA.  Father   is alive at age 18, has a history of prostate cancer and has a history   of coronary  disease treated with angioplasty.  The patient has 2   siblings, a twin brother and an older brother, neither whom is known to   have heart disease or prostate  problems.   Social History: Reviewed history from 01/22/2009 and no changes required. 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 Patient currently smokes.  1 cigg every 3 days Alcohol Use - Has consumed as much as 1/2 gallon vodka/day in past, currently at least 3 drinks/day Illicit Drug Use - no  Review of Systems       The patient complains of back pain.  The patient denies allergy/sinus, anemia, anxiety-new, arthritis/joint pain, blood in urine, breast changes/lumps, confusion, cough, coughing up blood, depression-new, fainting, fatigue, fever, headaches-new, hearing problems, heart murmur, heart rhythm changes, itching, muscle pains/cramps, night sweats, nosebleeds, shortness of breath, skin rash, sleeping problems, sore throat, swelling of feet/legs, swollen lymph glands, thirst - excessive, urination - excessive, urination changes/pain, urine leakage, vision changes, and voice change.         Pain in his left jaw area from recent dental surgery.  Vital Signs:  Patient profile:   60 year old male Height:      74 inches Weight:      219 pounds BMI:     28.22 Pulse rate:   76 / minute Pulse rhythm:   regular BP sitting:   130 / 90  (left arm) Cuff size:   regular  Vitals Entered By: Francee Piccolo CMA Duncan Dull) (September 24, 2009 11:31 AM)  Physical Exam  General:  Well developed, well nourished, no acute distress.healthy appearing.   Head:  Normocephalic and atraumatic. Eyes:  PERRLA, no icterus.exam deferred to patient's ophthalmologist.   Lungs:  Clear throughout to auscultation. Heart:  Regular rate and rhythm; no murmurs, rubs,  or bruits. Abdomen:  Soft, nontender and nondistended. No masses, hepatosplenomegaly or hernias noted. Normal bowel sounds.no organomegaly noted. Rectal:  deferred to urology Extremities:  No clubbing, cyanosis, edema or deformities noted. Neurologic:  Alert and   oriented x4;  grossly normal neurologically. Psych:  Alert and cooperative. Normal mood and affect.   Impression & Recommendations:  Problem # 1:  ATRIAL FIBRILLATION (ICD-427.31) Assessment Improved He appears to be in a regular rhythm at this time and he is to continue metaprolol and Diovan as previously prescribed.  Problem # 2:  ESSENTIAL HYPERTENSION (ICD-401.9) Assessment: Improved blood pressure today is 130/90.  Problem # 3:  METASTATIC PROSTATE CANCER (ICD-185) Assessment: Improved Continue on monotherapy as per Dr. Marcello Fennel and Dr. Cyndie Chime  Problem # 4:  ALCOHOLIC HEPATITIS (ICD-571.1) Assessment: Improved He continues to drink heavily but is doing well from a GI and hepatic standpoint. I again counseled him about his alcoholic liver disease and the need for avoidance of alcohol as much is possible. He has been through alcoholic rehabilitation on several occasions. There is no evidence of acute hepatitis or dancing cirrhosis at this time. I have placed him on daily AcipHex 20 mg while he is on aspirin and NSAIDs for his root canal recovery period  Patient Instructions: 1)  Copy sent to : Dr. Cyndie Chime and Dr.Tannebaum in urology 2)  Please continue current medications.  3)  Please schedule a follow-up appointment in 6 months. 4)  AcipHex 20 mg a day for the next 3 weeks. 5)  Refill Diovan. 6)  The medication list was reviewed and reconciled.  All changed / newly prescribed medications  were explained.  A complete medication list was provided to the patient / caregiver. Prescriptions: ACIPHEX 20 MG  TBEC (RABEPRAZOLE SODIUM) Take 1 each day 30 minutes before meals  #30 x 11   Entered by:   Ashok Cordia RN   Authorized by:   Mardella Layman MD [facility]   Signed by:   Ashok Cordia RN on 09/24/2009   Method used:   Electronically to        [facility] [address] [phone]* (retail)       [address]       [place], [place]  [address]       Ph:  [phone] or [phone]       Fax: [phone]   RxID:   [id number] DIOVAN 80 MG TABS (VALSARTAN) take one by mouth once daily  #30 x 11   Entered by:   Ashok Cordia RN   Authorized by:   Mardella Layman MD [facility]   Signed by:   Ashok Cordia RN on 09/24/2009   Method used:   [facility]        [facility] [address] [phone]* (retail)       [address]       [place], [place]  [address]       Ph: [phone] or [phone]       Fax: [phone]   RxID:   [id number]

## 2010-09-08 NOTE — Miscellaneous (Signed)
Summary: [facility]   [facility]   Imported By: Roderic Ovens 08/09/2010 12:09:29  _____________________________________________________________________  External Attachment:    Type:   Image     Comment:   External Document

## 2010-09-08 NOTE — Progress Notes (Signed)
Summary: talk to nurse   Phone Note From Other Clinic   Caller: elizabeth Summary of Call: per [facility] has a question concerning pt medications. [phone] Initial call taken by: Edman Circle,  April 14, 2010 9:12 AM  Follow-up for Phone Call        busy x1 Scherrie Bateman, LPN  April 14, 2010 9:17 AM  Additional Follow-up for Phone Call Additional follow up Details #1::        BP 92/58 at cardiac rehab.  Pharmacist Earvin Hansen with rehab concerned that patient is on Toprol x 25 mg daily and sotalol 120mg  two times a day, valsartan 80mg  daily.  He was feeling more tired yesterday.  He was according to [facility] nurse in more discomfort from his metastatic pros. cancer.   Pharmacist wanted to know if Dr. Daleen Squibb wanted to make any medication changes.  I will send to Dr. Daleen Squibb. Mylo Red RN    Additional Follow-up for Phone Call Additional follow up Details #2::    Stop valsarten and follow BP. Follow-up by: Gaylord Shih, MD, [facility],  April 15, 2010 9:09 AM   Appended Document: talk to nurse Pt wife aware and will stop Diovan.  He will start cardiac rehab MWF beginning 04/18/10.  He will monitor his blood pressure. Mylo Red RN

## 2010-09-08 NOTE — Assessment & Plan Note (Signed)
Summary: f42m/jss  Medications Added VITAMIN D3 2000 UNIT CAPS (CHOLECALCIFEROL) 1 cap once daily FLECAINIDE ACETATE 100 MG TABS (FLECAINIDE ACETATE) Take one tablet by mouth every 12 hours BENADRYL 25 MG TABS (DIPHENHYDRAMINE HCL) 1 tab as needed FERROUS SULFATE 325 (65 FE) MG TBEC (FERROUS SULFATE) 1 tab once daily HYDROCODONE-ACETAMINOPHEN 5-500 MG TABS (HYDROCODONE-ACETAMINOPHEN) as needed      Allergies Added: NKDA  Visit Type:  6 mo f/u Referring Provider:  n/a Primary Provider:  Sigmond Tannanbaum,MD  CC:  pt states he has had some congestion and also states he has some intestinal issues that started yesterday....no cardiac complaints today.  History of Present Illness: Allen Schroeder comes in today for followup of his atrial fibrillation. He states he is not feel well and was up most of the night with nausea and some vomiting. In fact, he awoke around 3:00 with the symptoms. Has been intermittent since.  He is in sinus tachycardia today with a right bundle branch block. comparing EKGs, it looks like he has new Q waves inferiorly with the ST elevation.  Denies any chest pain, angina, presyncope or syncope. He has not had any symptoms of atrial fibrillation. Other than the nausea and vomiting he's had no ischemic equivalent symptoms.  He's had a nagging cough for several months. He is on Diovan.  He denies orthopnea, PND or edema.  Current Medications (verified): 1)  Centrum  Tabs (Multiple Vitamins-Minerals) .... Take One By Mouth Once Daily 2)  Folic Acid 1 Mg  Tabs (Folic Acid) .... One By Mouth Once Daily 3)  Metoprolol Succinate 50 Mg Xr24h-Tab (Metoprolol Succinate) .... Take One Tablet By Mouth Daily 4)  Vitamin D3 2000 Unit Caps (Cholecalciferol) .[name].. 1 Cap Once Daily 5)  Calcium 500/d 500-200 Mg-Unit Tabs (Calcium Carbonate-Vitamin D) .[name].. 1 By Mouth Once Daily 6)  Flecainide Acetate 100 Mg Tabs (Flecainide Acetate) .... Take One Tablet By Mouth Every 12 Hours 7)   Simvastatin 40 Mg Tabs (Simvastatin) .... Take One Tablet By Mouth Daily At Bedtime 8)  Zometa 4 Mg/36ml Conc (Zoledronic Acid) .... Monthly 9)  Diovan 80 Mg Tabs (Valsartan) .... Take One By Mouth Once Daily 10)  Aspirin 325 Mg Tabs (Aspirin) .... Take 1 Tablet Daily 11)  Nucynta 100 Mg Tabs (Tapentadol Hcl) .[name].. 1 By Mouth Q 8 Hrs As Needed 12)  Ambien Cr 12.5 Mg Cr-Tabs (Zolpidem Tartrate) .[name].. 1 Q Hs 13)  Benadryl 25 Mg Tabs (Diphenhydramine Hcl) .[name].. 1 Tab As Needed 14)  Ferrous Sulfate 325 (65 Fe) Mg Tbec (Ferrous Sulfate) .[name].. 1 Tab Once Daily 15)  Hydrocodone-Acetaminophen 5-500 Mg Tabs (Hydrocodone-Acetaminophen) .... As Needed  Allergies (verified): No Known Drug Allergies  Past History:  Past Medical History: Last updated: 02/03/2009 Current Problems:  ATRIAL FIBRILLATION (ICD-427.31)      a.  s/p TEE/DCCV 01/13/2009      b.  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 Gerrit Friends 2002 vasectomy  Family History: Last updated: 02/19/09  Mother died at age 7 of complications of AAA.  Father   is alive at age 50, has a history of prostate cancer and has a history   of coronary disease treated with angioplasty.  The patient has 2   siblings, a twin brother and an  older brother, neither whom is known to   have heart disease or prostate problems.   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 Patient currently smokes.  1 cigg every 3 days Alcohol  Use - Has consumed as much as 1/2 gallon vodka/day in past, currently at least 3 drinks/day Illicit Drug Use - no  Risk Factors: Alcohol Use: 4+ (02/10/2008)  Risk Factors: Smoking Status: current (02/10/2008)  Review of Systems       negative other than history of present illness  Vital Signs:  Patient profile:   60 year old male Height:      74 inches Weight:      218 pounds BMI:     28.09 Pulse rate:   103 / minute Pulse rhythm:   irregular BP sitting:   146 / 90  (left arm) Cuff size:   large  Vitals Entered By: Danielle Rankin, CMA (March 18, 2010 2:20 PM)  Physical Exam  General:  anxious, alert and oriented x3, clammy Head:  normocephalic and atraumatic Eyes:  PERRLA/EOM intact; conjunctiva and lids normal. Neck:  Neck supple, no JVD. No masses, thyromegaly or abnormal cervical nodes. Lungs:  Clear bilaterally to auscultation and percussion. Heart:  PMI nondisplaced, rapid rate and rhythm, no gallop no rub. Carotids equal without bruits Abdomen:  Bowel sounds positive; abdomen soft and non-tender without masses, organomegaly, or hernias noted. No hepatosplenomegaly. Msk:  Back normal, normal gait. Muscle strength and tone normal. Pulses:  pulses normal in all 4 extremities Extremities:  No clubbing or cyanosis. Neurologic:  Alert and oriented x 3. Skin:  clammy Psych:  anxious and easily distracted.     Problems:  Medical Problems Added: 1)  Dx of Myocardial Infarction, Inferior Wall, Initial Episode  (ICD-410.41)  EKG  Procedure date:  03/18/2010  Findings:      sinus tachycardia, U. cues in 3 and aVF, 1 mm ST elevation in 3 and aVF, right bundle branch block left axis deviation  Impression & Recommendations:  Problem # 1:  MYOCARDIAL INFARCTION, INFERIOR WALL, INITIAL EPISODE (ICD-410.41) His symptoms of nausea and vomiting started about 3:00 this morning. He is a little bit over 12 hours out at this point. His updated medication list for this problem  includes:    Metoprolol Succinate 50 Mg Xr24h-tab (Metoprolol succinate) .[name]... Take one tablet by mouth daily    Aspirin 325 Mg Tabs (Aspirin) .[name]... Take 1 tablet daily  Problem # 2:  ATRIAL FIBRILLATION (ICD-427.31) Assessment: Unchanged  His updated medication list for this problem includes:    Metoprolol Succinate 50 Mg Xr24h-tab (Metoprolol succinate) .[name]... Take one tablet by mouth daily    Flecainide Acetate 100 Mg Tabs (Flecainide acetate) .[name]... Take one tablet by mouth every 12 hours    Aspirin 325 Mg Tabs (Aspirin) .[name]... Take 1 tablet daily  Orders: EKG w/ Interpretation (93000) Echocardiogram (Echo)  Problem # 3:  ESSENTIAL HYPERTENSION (ICD-401.9)  His updated medication list for this problem includes:    Metoprolol Succinate 50 Mg Xr24h-tab (Metoprolol succinate) .[name]... Take one tablet by mouth daily    Diovan 80 Mg Tabs (Valsartan) .[name]... Take one by mouth once daily    Aspirin 325 Mg Tabs (Aspirin) .[name]... Take 1 tablet daily  Problem # 4:  HYPERLIPIDEMIA (ICD-272.4)  His updated medication list for this problem includes:    Simvastatin 40 Mg Tabs (Simvastatin) .[name]... Take one  tablet by mouth daily at bedtime  Problem # 5:  ALCOHOL ABUSE (ICD-305.00) Assessment: Unchanged  Problem # 6:  METASTATIC PROSTATE CANCER (ICD-185)  Problem # 7:  ANXIETY (ICD-300.00) Assessment: Unchanged  Problem # 8:  ALCOHOLIC HEPATITIS (ICD-571.1)  Patient Instructions: 1)  Admit to [facility]. Possible acute cath Cat lab aware.

## 2010-09-08 NOTE — Miscellaneous (Signed)
Summary: [facility] Order[facility]Treatment Plan   [facility] Order[facility] Plan   Imported By: Roderic Ovens 04/12/2010 11:33:30  _____________________________________________________________________  External Attachment:    Type:   Image     Comment:   External Document

## 2010-09-08 NOTE — Progress Notes (Signed)
Summary: Appointment needed   Phone Note Outgoing Call   Call placed by: Ashok Cordia RN,  January 31, 2010 3:40 PM Summary of Call: Pt needs OV and CDT level per Dr. Jarold Motto.  LM for pt to call to sch. Initial call taken by: Ashok Cordia RN,  January 31, 2010 3:41 PM  Follow-up for Phone Call        LM for pt to call.  Lupita Leash Surface RN  February 10, 2010 12:13 PM  Unable to reach pt by phone.  Letter mailed. Follow-up by: Ashok Cordia RN,  February 14, 2010 11:36 AM

## 2010-09-08 NOTE — Assessment & Plan Note (Signed)
Summary: 6WK F/U SL  Medications Added AMBIEN CR 12.5 MG CR-TABS (ZOLPIDEM TARTRATE) 1 tab at bedtime NITROSTAT 0.4 MG SUBL (NITROGLYCERIN) 1 tablet under tongue at onset of chest pain; you may repeat every 5 minutes for up to 3 doses.      Allergies Added: NKDA  Visit Type:  6 wk f/u Referring Provider:  n/a Primary Provider:  Sigmond Tannanbaum,MD  CC:  pt states he had cancer tx done yesterday....pt is doing cardiac rehab 3x wkly....denies any cardiac complaints today.  History of Present Illness: Alphonzo returns today for evaluation and management his coronary artery disease. He's had no symptoms of angina or ischemic equivalence. He is now 3 months out from LAD stenting.  Lipids were not quite at goal on 20 mg of Crestor. However his LFTs were elevated and I decided not to increase his dose.he has cut back to 3 stiff drinks 4 times a week.  His biggest problem is he feels depressed. This is been noted at cardiac rehabilitation. He wakes up at 3 AM he can go back to sleep. In the past taking Ambien as well. He would also like to receive some counseling. He knows Dr. Dellia Cloud from the past.  Current Medications (verified): 1)  Centrum  Tabs (Multiple Vitamins-Minerals) .... Take One By Mouth Once Daily 2)  Folic Acid 1 Mg  Tabs (Folic Acid) .... One By Mouth Once Daily 3)  Metoprolol Succinate 25 Mg Xr24h-Tab (Metoprolol Succinate) .[name].. 1 Tab Once Daily 4)  Vitamin D3 2000 Unit Caps (Cholecalciferol) .[name].. 1 Cap Once Daily 5)  Calcium 500/d 500-200 Mg-Unit Tabs (Calcium Carbonate-Vitamin D) .[name].. 1 By Mouth Once Daily 6)  Crestor 20 Mg Tabs (Rosuvastatin Calcium) .... Take 1 Tablet Daily At Bedtime 7)  Zometa 4 Mg/27ml Conc (Zoledronic Acid) .... Monthly 8)  Aspirin 325 Mg Tabs (Aspirin) .... Take 1 Tablet Daily 9)  Nucynta 100 Mg Tabs (Tapentadol Hcl) .[name].. 1 By Mouth Q 8 Hrs As Needed 10)  Ambien Cr 12.5 Mg Cr-Tabs (Zolpidem Tartrate) .[name].. 1 Tab At Bedtime As Needed 11)  Benadryl  25 Mg Tabs (Diphenhydramine Hcl) .[name].. 1 Tab As Needed 12)  Ferrous Sulfate 325 (65 Fe) Mg Tbec (Ferrous Sulfate) .[name].. 1 Tab Once Daily 13)  Sotalol Hcl 120 Mg Tabs (Sotalol Hcl) .[name].. 1 Tab Two Times A Day 14)  Plavix 75 Mg Tabs (Clopidogrel Bisulfate) .... One Tablet By Mouth Once Daily 15)  Nitrostat 0.4 Mg Subl (Nitroglycerin) .[name].. 1 Tablet Under Tongue At Onset of Chest Pain; You May Repeat Every 5 Minutes For Up To 3 Doses.  Allergies (verified): No Known Drug Allergies  Past History:  Past Medical History: Last updated: 02/03/2009 Current Problems:  ATRIAL FIBRILLATION (ICD-427.31)      a.  s/p TEE/DCCV 01/13/2009      b.  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 Gerrit Friends 2002 vasectomy Drug eluting stent placed  Family History: Last updated: January 25, 2009  Mother died at age 24 of complications of AAA.  Father   is alive at age 68, has a history of prostate cancer and has a history   of coronary disease treated with angioplasty.  The patient has 2   siblings, a twin brother and an older brother, neither whom is  known to   have heart disease or prostate problems.   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 Patient currently smokes.  1 cigg every 3 days Alcohol Use - Has consumed as much as 1/2 gallon vodka/day in past, currently at least 3 drinks/day Illicit Drug Use - no  Risk Factors: Alcohol Use: 4+ (02/10/2008)  Risk Factors: Smoking  Status: current (02/10/2008)  Review of Systems       negative other than history of present illness  Vital Signs:  Patient profile:   60 year old male Height:      74 inches Weight:      216.8 pounds Pulse rate:   79 / minute Pulse rhythm:   irregular BP sitting:   120 / 82  (left arm) Cuff size:   large  Vitals Entered By: Danielle Rankin, CMA (June 16, 2010 11:34 AM)  Physical Exam  General:  Well developed, well nourished, in no acute distress. Head:  normocephalic and atraumatic Eyes:  PERRLA/EOM intact; conjunctiva and lids normal. Neck:  Neck supple, no JVD. No masses, thyromegaly or abnormal cervical nodes. Chest Wall:  no deformities or breast masses noted Lungs:  Clear bilaterally to auscultation and percussion. Heart:  PMI nondisplaced, regular rate and rhythm, normal S1-S2, no carotid bruits Msk:  Back normal, normal gait. Muscle strength and tone normal. Pulses:  pulses normal in all 4 extremities Extremities:  No clubbing or cyanosis. Neurologic:  Alert and oriented x 3. Skin:  Intact without lesions or rashes. Psych:  Normal affect.   Impression & Recommendations:  Problem # 1:  DEPRESSION (ICD-311) Assessment New I will refer to Dr. Dellia Cloud. I have called his office personally. In the meantime I've asked to take Ambien each night to see if he can sleep through the night. I am hesitant to use an antidepressant till evaluated by Dr. Dellia Cloud because the alcohol problem. Orders: Misc. Referral (Misc. Ref)  Problem # 2:  MYOCARDIAL INFARCTION, SUBENDOCARDIAL, SUBSEQUENT CARE (ICD-410.72) Assessment: Unchanged  His updated medication list for this problem includes:    Metoprolol Succinate 25 Mg Xr24h-tab (Metoprolol succinate) .[name]... 1 tab once daily    Aspirin 325 Mg Tabs (Aspirin) .[name]... Take 1 tablet daily    Sotalol Hcl 120 Mg Tabs (Sotalol hcl) .[name]... 1 tab two times a day    Plavix 75 Mg Tabs (Clopidogrel bisulfate) ..... One tablet by mouth once  daily    Nitrostat 0.4 Mg Subl (Nitroglycerin) .[name]... 1 tablet under tongue at onset of chest pain; you may repeat every 5 minutes for up to 3 doses.  Problem # 3:  CAD, NATIVE VESSEL (ICD-414.01) Assessment: Unchanged  His updated medication list for this problem includes:    Metoprolol Succinate 25 Mg Xr24h-tab (Metoprolol succinate) .[name]... 1 tab once daily    Aspirin 325 Mg Tabs (Aspirin) .[name]... Take 1 tablet daily    Sotalol Hcl 120 Mg Tabs (Sotalol hcl) .[name]... 1 tab two times a day    Plavix 75 Mg Tabs (Clopidogrel bisulfate) ..... One tablet by mouth once daily    Nitrostat 0.4 Mg Subl (Nitroglycerin) .[name]... 1 tablet under tongue at onset of chest pain; you may repeat every 5 minutes for up to 3 doses.  Problem # 4:  HYPERLIPIDEMIA (ICD-272.4) Will need LFTs in about 4 weeks. His updated medication list for this problem includes:    Crestor 20 Mg Tabs (Rosuvastatin  calcium) .[name]... Take 1 tablet daily at bedtime  Problem # 5:  ATRIAL FIBRILLATION (ICD-427.31) doing well on sotolol. EKG stable His updated medication list for this problem includes:    Metoprolol Succinate 25 Mg Xr24h-tab (Metoprolol succinate) .[name]... 1 tab once daily    Aspirin 325 Mg Tabs (Aspirin) .[name]... Take 1 tablet daily    Sotalol Hcl 120 Mg Tabs (Sotalol hcl) .[name]... 1 tab two times a day    Plavix 75 Mg Tabs (Clopidogrel bisulfate) ..... One tablet by mouth once daily  Other Orders: EKG w/ Interpretation (93000)  Patient Instructions: 1)  Your physician recommends that you schedule a follow-up appointment in: 3 months with Dr. Daleen Squibb 2)  Your physician recommends that you continue on your current medications as directed. Please refer to the Current Medication list given to you today. 3)  You have been referred to Dr. Caralyn Guile 4)  Your physician recommends that you return for lab work end of December for liver enzyme test 414.01, Prescriptions: AMBIEN CR 12.5 MG CR-TABS (ZOLPIDEM TARTRATE) 1 tab at bedtime   #30 x 1   Entered by:   Lisabeth Devoid RN   Authorized by:   Gaylord Shih, MD, [facility]   Signed by:   Lisabeth Devoid RN on 06/16/2010   Method used:   Historical   RxID:   [id number]

## 2010-09-08 NOTE — Progress Notes (Signed)
Summary: Rx Refill   Phone Note Call from Patient Call back at Work Phone [phone]   Caller: Patient Call For: Dr Jarold Motto Reason for Call: Refill Medication, Talk to Nurse Details for Reason: RX refill Summary of Call: Pt has appt next Friday 04/29/10, but only has 4 pills left. Would like refill prior to appt for Nucynta. Initial call taken by: Dwan Bolt,  April 19, 2010 10:34 AM  Follow-up for Phone Call        rx filled and Left a message on patients machine to notify pt Follow-up by: Harlow Mares CMA Duncan Dull),  April 19, 2010 10:44 AM    Prescriptions: NUCYNTA 100 MG TABS (TAPENTADOL HCL) 1 by mouth q 8 hrs as needed  #60 x 0   Entered by:   Harlow Mares CMA (AAMA)   Authorized by:   Mardella Layman MD [facility]   Signed by:   Harlow Mares CMA (AAMA) on 04/19/2010   Method used:   Printed then faxed to ...       [facility]  [phone]* (retail)       [address]       [place], [place]  [address]       Ph: [phone] or [phone]       Fax: [phone]   RxID:   [id number]   Appended Document: Rx Refill    Clinical Lists Changes  Medications: Rx of NUCYNTA 100 MG TABS (TAPENTADOL HCL) 1 by mouth q 8 hrs as needed;  #60 x 0;  Signed;  Entered by: Harlow Mares CMA (AAMA);  Authorized by: Mardella Layman MD FACG;  Method used: Reprint    Prescriptions: NUCYNTA 100 MG TABS (TAPENTADOL HCL) 1 by mouth q 8 hrs as needed  #60 x 0   Entered by:   Harlow Mares CMA (AAMA)   Authorized by:   Mardella Layman MD [facility]   Signed by:   Harlow Mares CMA (AAMA) on 04/19/2010   Method used:   Reprint   RxID:   [id number]

## 2010-09-08 NOTE — Letter (Signed)
[facility]   Imported By: Sherian Rein 02/11/2010 14:05:09  _____________________________________________________________________  External Attachment:    Type:   Image     Comment:   External Document

## 2010-09-08 NOTE — Progress Notes (Signed)
Summary: needs orders for cardiac rehab   Phone Note From Other Clinic   Caller: maria w/cardiac rehab Summary of Call: needs orders to start his cardiac rehab for monday-pls call [phone] Initial call taken by: Glynda Jaeger,  April 15, 2010 9:13 AM  Follow-up for Phone Call        I spoke with Carlette about Mr. Khatoon and to monitor b/p.  I will call Mr. Bursch about stopping valsartan. Cardiac rehab will resend his order to be timed. I will refax out today that order. Mylo Red RN     Appended Document: needs orders for cardiac rehab  Reviewed Juanito Doom, MD

## 2010-09-08 NOTE — Letter (Signed)
Summary: [facility]   Imported By: Marylou Mccoy 05/27/2010 14:45:04  _____________________________________________________________________  External Attachment:    Type:   Image     Comment:   External Document

## 2010-09-08 NOTE — Letter (Signed)
Summary: [facility]  [facility]   Imported By: Lester Cosmos 05/03/2010 13:24:05  _____________________________________________________________________  External Attachment:    Type:   Image     Comment:   External Document

## 2010-09-08 NOTE — Letter (Signed)
Summary: [facility]  [facility]   Imported By: Lester Seminole 10/04/2009 09:03:59  _____________________________________________________________________  External Attachment:    Type:   Image     Comment:   External Document

## 2010-09-08 NOTE — Miscellaneous (Signed)
Summary: [facility]   [facility]   Imported By: Roderic Ovens 05/05/2010 15:10:51  _____________________________________________________________________  External Attachment:    Type:   Image     Comment:   External Document

## 2010-09-08 NOTE — Assessment & Plan Note (Signed)
Summary: eph/mt  Medications Added METOPROLOL SUCCINATE 25 MG XR24H-TAB (METOPROLOL SUCCINATE) 1 tab once daily AMBIEN CR 12.5 MG CR-TABS (ZOLPIDEM TARTRATE) 1 tab at bedtime as needed SOTALOL HCL 120 MG TABS (SOTALOL HCL) 1 tab two times a day      Allergies Added: NKDA  Visit Type:  EPH Referring Provider:  n/a Primary Provider:  Sigmond Tannanbaum,MD  CC:  no cardiac complaints today.  History of Present Illness: Allen Schroeder comes in today for followup. He was admitted to the office with an acute infarct on last visit. He had urgent catheterization it showed a ulcerated flap is proximal LAD.  He underwent a drug-eluting stent to the LAD. He's done remarkably well since his EKG is normalized.  He's having no angina or ischemic equivalence. The day of his instability he had nausea vomiting and cold sweats as well as a tightness in his chest.  He is in cardiac rehabilitation and his pressures below. We have asked him to stop his Diovan today.  We changed his flutter not the sotalol 120 mg b.i.d. when found that he had coronary disease. He has done well on this medication is in sinus rhythm today.  Clinical Reports Reviewed:  Cardiac Cath:  03/22/2010: Cardiac Cath Findings:   RESULTS:  Noticed in proximal LAD was about 50% with a ruptured plaque.   Following stenting, the plaque was sealed and there was 0% stenosis..      CONCLUSION:  Successful PCI of the complex lesion in the proximal LAD   felt to represent a ruptured plaque with improvement in center narrowing   from 50% to 0% using a bare-metal stent.      DISPOSITION:  The patient will return to the post angio room for further   observation.               Bruce Elvera Lennox Juanda Chance, MD, [facility]         03/18/2010: Cardiac Cath Findings:   Left ventriculogram done in the RAO position showed an EF of 60%.  There   were no regional wall motion abnormalities.  No significant mitral   regurgitation.      ASSESSMENT:   1.  Moderate LAD stenosis with appearance of healed ulcerated plaque.   2. Mild RCA stenosis.   3. No significant left circumflex stenosis.   4. Normal LV function.      PLAN/DISCUSSION:  Although he does have what appears to be an ulcerated   plaque in the LAD, this does not appear to be acute and I do not think   explains his acute presentation.  It appears that the most reasonable   approach would be to have him undergo workup of his current symptoms.   Once he is more stable from this standpoint, he can be brought back for   an elective angioplasty of his LAD disease.  Given his ongoing alcohol   abuse, I would favor the use of a bare-metal stent.  Of note, given his   coronary artery disease, we will stop his flecainide and can use sotalol   as needed for his atrial fibrillation.  The case was discussed with Dr.   Gala Romney and Dr. Daleen Squibb.               Bevelyn Buckles. Bensimhon, MD          Current Medications (verified): 1)  Centrum  Tabs (Multiple Vitamins-Minerals) .... Take One By Mouth Once Daily 2)  Folic Acid 1 Mg  Tabs (Folic Acid) .... One By Mouth Once Daily 3)  Metoprolol Succinate 25 Mg Xr24h-Tab (Metoprolol Succinate) .[name].. 1 Tab Once Daily 4)  Vitamin D3 2000 Unit Caps (Cholecalciferol) .[name].. 1 Cap Once Daily 5)  Calcium 500/d 500-200 Mg-Unit Tabs (Calcium Carbonate-Vitamin D) .[name].. 1 By Mouth Once Daily 6)  Simvastatin 40 Mg Tabs (Simvastatin) .... Take One Tablet By Mouth Daily At Bedtime 7)  Zometa 4 Mg/36ml Conc (Zoledronic Acid) .... Monthly 8)  Diovan 80 Mg Tabs (Valsartan) .... Take One By Mouth Once Daily 9)  Aspirin 325 Mg Tabs (Aspirin) .... Take 1 Tablet Daily 10)  Nucynta 100 Mg Tabs (Tapentadol Hcl) .[name].. 1 By Mouth Q 8 Hrs As Needed 11)  Ambien Cr 12.5 Mg Cr-Tabs (Zolpidem Tartrate) .[name].. 1 Tab At Bedtime As Needed 12)  Benadryl 25 Mg Tabs (Diphenhydramine Hcl) .[name].. 1 Tab As Needed 13)  Ferrous Sulfate 325 (65 Fe) Mg Tbec (Ferrous Sulfate) .[name].. 1 Tab Once  Daily 14)  Sotalol Hcl 120 Mg Tabs (Sotalol Hcl) .[name].. 1 Tab Two Times A Day  Allergies (verified): No Known Drug Allergies  Past History:  Past Medical History: Last updated: 02/03/2009 Current Problems:  ATRIAL FIBRILLATION (ICD-427.31)      a.  s/p TEE/DCCV 01/13/2009      b.  Flecainide initiated 01/13/2009 HYPERLIPIDEMIA (ICD-272.4) ESSENTIAL HYPERTENSION (ICD-401.9) METASTATIC PROSTATE CANCER (ICD-185) PROSTATE SPECIFIC ANTIGEN, ELEVATED (ICD-790.93) COLONIC POLYPS, HX OF (ICD-V12.72) DIVERTICULOSIS, COLON (ICD-562.10) HYPOCALCEMIA (ICD-275.41) ALCOHOLIC HEPATITIS (ICD-571.1) FAMILIAL TREMOR (ICD-333.1) ANXIETY (ICD-300.00) ALCOHOL ABUSE (ICD-305.00)    Past Surgical History: Last updated: 2009-02-12 Cholecystectomy Dr. Gerrit Friends 2002 vasectomy  Family History: Last updated: 2009/02/12  Mother died at age 29 of complications of AAA.  Father   is alive at age 53, has a history of prostate cancer and has a history   of coronary disease treated with angioplasty.  The patient has 2   siblings, a twin brother and an older brother, neither whom is known to   have heart disease or prostate problems.   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 Patient currently smokes.  1 cigg every 3 days Alcohol Use - Has consumed as much as 1/2 gallon vodka/day in past, currently at least 3 drinks/day Illicit Drug Use - no  Risk Factors: Alcohol Use: 4+ (02/10/2008)  Risk Factors: Smoking Status: current (02/10/2008)  Review of Systems       negative other than history of present illness  Vital Signs:  Patient profile:   60 year old male Height:      74 inches Weight:      219 pounds BMI:     28.22 Pulse rate:   80 / minute Pulse rhythm:   regular BP sitting:   110 / 76  (left arm) Cuff size:   large  Vitals Entered By: Danielle Rankin, CMA (April 26, 2010 3:31 PM)  Physical Exam  General:  Well developed, well nourished, in no acute distress. Head:  normocephalic and atraumatic Eyes:  PERRLA/EOM intact; conjunctiva and lids normal. Neck:  Neck supple, no JVD. No masses, thyromegaly or abnormal cervical nodes. Chest Wall:  no deformities or breast masses noted Lungs:  Clear bilaterally to auscultation and percussion. Heart:  PMI nondisplaced, regular rate and rhythm, normal S1-S2, no murmur or gallop. Carotids full without bruits Msk:  Back normal, normal gait. Muscle strength and tone normal. Pulses:  pulses normal  in all 4 extremities Extremities:  No clubbing or cyanosis. Neurologic:  Alert and oriented x 3. Skin:  Intact without lesions or rashes. Psych:  Normal affect.   Problems:  Medical Problems Added: 1)  Dx of Myocardial Infarction, Subendocardial, Subsequent Care  (ICD-410.72) 2)  Dx of Cad, Native Vessel  (ICD-414.01)  EKG  Procedure date:  04/26/2010  Findings:      normal sinus rhythm, normal EKG  Impression & Recommendations:  Problem # 1:  CAD, NATIVE VESSEL (ICD-414.01) Assessment Improved  His updated medication list for this problem includes:    Metoprolol Succinate 25 Mg Xr24h-tab (Metoprolol succinate) .[name]... 1 tab once daily    Aspirin 325 Mg Tabs (Aspirin) .[name]... Take 1 tablet daily    Sotalol Hcl 120 Mg Tabs (Sotalol hcl) .[name]... 1 tab two times a day  Problem # 2:  MYOCARDIAL INFARCTION, SUBENDOCARDIAL, SUBSEQUENT CARE (ICD-410.72)  His updated medication list for this problem includes:    Metoprolol Succinate 25 Mg Xr24h-tab (Metoprolol succinate) .[name]... 1 tab once daily    Aspirin 325 Mg Tabs (Aspirin) .[name]... Take 1 tablet daily    Sotalol Hcl 120 Mg Tabs (Sotalol hcl) .[name]... 1 tab two times a day  Problem # 3:  ATRIAL FIBRILLATION (ICD-427.31) He is doing well on sotalol and low-dose metoprolol. No changes. The following medications were removed from the medication list:    Flecainide  Acetate 100 Mg Tabs (Flecainide acetate) .[name]... Take one tablet by mouth every 12 hours His updated medication list for this problem includes:    Metoprolol Succinate 25 Mg Xr24h-tab (Metoprolol succinate) .[name]... 1 tab once daily    Aspirin 325 Mg Tabs (Aspirin) .[name]... Take 1 tablet daily    Sotalol Hcl 120 Mg Tabs (Sotalol hcl) .[name]... 1 tab two times a day  Orders: EKG w/ Interpretation (93000)  Problem # 4:  HYPERLIPIDEMIA (ICD-272.4) Will check fasting lipids in 2 weeks and LFTs His updated medication list for this problem includes:    Simvastatin 40 Mg Tabs (Simvastatin) .[name]... Take one tablet by mouth daily at bedtime  Problem # 5:  ESSENTIAL HYPERTENSION (ICD-401.9) Since his pressures have been running low, I stopped his Diovan. His updated medication list for this problem includes:    Metoprolol Succinate 25 Mg Xr24h-tab (Metoprolol succinate) .[name]... 1 tab once daily    Diovan 80 Mg Tabs (Valsartan) .[name]... Take one by mouth once daily    Aspirin 325 Mg Tabs (Aspirin) .[name]... Take 1 tablet daily    Sotalol Hcl 120 Mg Tabs (Sotalol hcl) .[name]... 1 tab two times a day  Patient Instructions: 1)  Your physician recommends that you schedule a follow-up appointment in: 6-8 WEEKS WITH DR WALL 2)  Your physician recommends that you return for lab work ZO:XWRUE LIVER FASTING  2 WEEKS 3)  Your physician has recommended you make the following change in your medication: STOP DIOVAN

## 2010-09-08 NOTE — Miscellaneous (Signed)
Summary: [facility]   [facility]   Imported By: Roderic Ovens 05/12/2010 15:55:38  _____________________________________________________________________  External Attachment:    Type:   Image     Comment:   External Document

## 2010-09-08 NOTE — Letter (Signed)
Summary[facility]  [facility]   Imported By: Marylou Mccoy 05/19/2010 14:38:23  _____________________________________________________________________  External Attachment:    Type:   Image     Comment:   External Document

## 2010-09-08 NOTE — Letter (Signed)
Summary: [facility] Office Note  [facility] Office Note   Imported By: Roderic Ovens 10/08/2009 14:59:42  _____________________________________________________________________  External Attachment:    Type:   Image     Comment:   External Document

## 2010-09-09 ENCOUNTER — Encounter (HOSPITAL_COMMUNITY): Payer: 59 | Attending: Cardiology

## 2010-09-09 DIAGNOSIS — I200 Unstable angina: Secondary | ICD-10-CM | POA: Insufficient documentation

## 2010-09-09 DIAGNOSIS — I10 Essential (primary) hypertension: Secondary | ICD-10-CM | POA: Insufficient documentation

## 2010-09-09 DIAGNOSIS — Z7982 Long term (current) use of aspirin: Secondary | ICD-10-CM | POA: Insufficient documentation

## 2010-09-09 DIAGNOSIS — I4891 Unspecified atrial fibrillation: Secondary | ICD-10-CM | POA: Insufficient documentation

## 2010-09-09 DIAGNOSIS — C7982 Secondary malignant neoplasm of genital organs: Secondary | ICD-10-CM | POA: Insufficient documentation

## 2010-09-09 DIAGNOSIS — Z5189 Encounter for other specified aftercare: Secondary | ICD-10-CM | POA: Insufficient documentation

## 2010-09-09 DIAGNOSIS — Z7902 Long term (current) use of antithrombotics/antiplatelets: Secondary | ICD-10-CM | POA: Insufficient documentation

## 2010-09-09 DIAGNOSIS — F1010 Alcohol abuse, uncomplicated: Secondary | ICD-10-CM | POA: Insufficient documentation

## 2010-09-09 DIAGNOSIS — Z9861 Coronary angioplasty status: Secondary | ICD-10-CM | POA: Insufficient documentation

## 2010-09-09 DIAGNOSIS — C801 Malignant (primary) neoplasm, unspecified: Secondary | ICD-10-CM | POA: Insufficient documentation

## 2010-09-12 ENCOUNTER — Encounter (HOSPITAL_COMMUNITY): Payer: 59

## 2010-09-13 ENCOUNTER — Other Ambulatory Visit: Payer: Self-pay | Admitting: Cardiology

## 2010-09-13 ENCOUNTER — Encounter: Payer: Self-pay | Admitting: Cardiology

## 2010-09-13 ENCOUNTER — Ambulatory Visit (INDEPENDENT_AMBULATORY_CARE_PROVIDER_SITE_OTHER): Payer: 59 | Admitting: Cardiology

## 2010-09-13 DIAGNOSIS — I4891 Unspecified atrial fibrillation: Secondary | ICD-10-CM

## 2010-09-13 DIAGNOSIS — K7010 Alcoholic hepatitis without ascites: Secondary | ICD-10-CM

## 2010-09-13 DIAGNOSIS — E785 Hyperlipidemia, unspecified: Secondary | ICD-10-CM

## 2010-09-13 DIAGNOSIS — I2510 Atherosclerotic heart disease of native coronary artery without angina pectoris: Secondary | ICD-10-CM

## 2010-09-13 DIAGNOSIS — I214 Non-ST elevation (NSTEMI) myocardial infarction: Secondary | ICD-10-CM

## 2010-09-13 LAB — HEPATIC FUNCTION PANEL
ALT: 35 U/L (ref 0–53)
AST: 60 U/L — ABNORMAL HIGH (ref 0–37)
Albumin: 4 g/dL (ref 3.5–5.2)
Alkaline Phosphatase: 61 U/L (ref 39–117)
Total Protein: 7.2 g/dL (ref 6.0–8.3)

## 2010-09-14 ENCOUNTER — Encounter (HOSPITAL_COMMUNITY): Payer: 59

## 2010-09-15 ENCOUNTER — Telehealth: Payer: Self-pay | Admitting: Cardiology

## 2010-09-16 ENCOUNTER — Encounter (HOSPITAL_COMMUNITY): Payer: Self-pay

## 2010-09-19 ENCOUNTER — Encounter (HOSPITAL_COMMUNITY): Payer: Self-pay

## 2010-09-21 ENCOUNTER — Encounter (HOSPITAL_COMMUNITY): Payer: Self-pay

## 2010-09-22 NOTE — Assessment & Plan Note (Signed)
Summary: f76m/dfg/ep  Medications Added BENADRYL 25 MG TABS (DIPHENHYDRAMINE HCL) 1-2  tablets as needed at bedtime      Allergies Added: NKDA  Visit Type:  62mo follow up Referring Provider:  n/a Primary Provider:  Verner Mould  CC:  pt has no complaints today.  History of Present Illness: Allen Schroeder  comes in today for followup of his coronary artery disease, stenting of the LAD, mixed hyperlipidemia, and atrial fibrillation.  He is had no recurrent atrial fib he is aware of. He is tolerating sotalol and metoprolol well. He's had no angina or ischemic equivalence. He is finishing up cardiac rehabilitation.  His lipids recent showed a total cholesterol 200, HDL 61, cholesterol to HDL ratio of 3, triglycerides 327, VLDL of 65.4, LDL 95.6. Some Crestor. Some of this is probably related to drinking. His LFTs were also up with 8 AST 101 ALT of 57. We will repeat these today  He is not sleeping well. I told him he can use Benadryl.  His EKG today is normal.  Clinical Reports Reviewed:  Cardiac Cath:  03/22/2010: Cardiac Cath Findings:   RESULTS:  Noticed in proximal LAD was about 50% with a ruptured plaque.   Following stenting, the plaque was sealed and there was 0% stenosis..      CONCLUSION:  Successful PCI of the complex lesion in the proximal LAD   felt to represent a ruptured plaque with improvement in center narrowing   from 50% to 0% using a bare-metal stent.      DISPOSITION:  The patient will return to the post angio room for further   observation.               Bruce Elvera Lennox Juanda Chance, MD, [facility]         03/18/2010: Cardiac Cath Findings:   Left ventriculogram done in the RAO position showed an EF of 60%.  There   were no regional Praneel Haisley Mabry motion abnormalities.  No significant mitral   regurgitation.      ASSESSMENT:   1. Moderate LAD stenosis with appearance of healed ulcerated plaque.   2. Mild RCA stenosis.   3. No significant left circumflex stenosis.   4.  Normal LV function.      PLAN/DISCUSSION:  Although he does have what appears to be an ulcerated   plaque in the LAD, this does not appear to be acute and I do not think   explains his acute presentation.  It appears that the most reasonable   approach would be to have him undergo workup of his current symptoms.   Once he is more stable from this standpoint, he can be brought back for   an elective angioplasty of his LAD disease.  Given his ongoing alcohol   abuse, I would favor the use of a bare-metal stent.  Of note, given his   coronary artery disease, we will stop his flecainide and can use sotalol   as needed for his atrial fibrillation.  The case was discussed with Dr.   Gala Romney and Dr. Daleen Squibb.               Bevelyn Buckles. Bensimhon, MD          Current Medications (verified): 1)  Centrum  Tabs (Multiple Vitamins-Minerals) .... Take One By Mouth Once Daily 2)  Folic Acid 1 Mg  Tabs (Folic Acid) .... One By Mouth Once Daily 3)  Metoprolol Succinate 25 Mg Xr24h-Tab (Metoprolol Succinate) .[name].. 1 Tab Once Daily 4)  Vitamin D3 2000 Unit Caps (Cholecalciferol) .[name].. 1 Cap Once Daily 5)  Calcium 500/d 500-200 Mg-Unit Tabs (Calcium Carbonate-Vitamin D) .[name].. 1 By Mouth Once Daily 6)  Crestor 20 Mg Tabs (Rosuvastatin Calcium) .... Take 1 Tablet Daily At Bedtime 7)  Zometa 4 Mg/82ml Conc (Zoledronic Acid) .... Monthly 8)  Aspirin 325 Mg Tabs (Aspirin) .... Take 1 Tablet Daily 9)  Nucynta 100 Mg Tabs (Tapentadol Hcl) .[name].. 1 By Mouth Q 8 Hrs As Needed 10)  Benadryl 25 Mg Tabs (Diphenhydramine Hcl) .[name].. 1 Tab As Needed 11)  Ferrous Sulfate 325 (65 Fe) Mg Tbec (Ferrous Sulfate) .[name].. 1 Tab Once Daily 12)  Sotalol Hcl 120 Mg Tabs (Sotalol Hcl) .[name].. 1 Tab Two Times A Day 13)  Plavix 75 Mg Tabs (Clopidogrel Bisulfate) .... One Tablet By Mouth Once Daily 14)  Nitrostat 0.4 Mg Subl (Nitroglycerin) .[name].. 1 Tablet Under Tongue At Onset of Chest Pain; You May Repeat Every 5 Minutes For Up To 3  Doses.  Allergies (verified): No Known Drug Allergies  Past History:  Past Medical History: Last updated: 02/03/2009 Current Problems:  ATRIAL FIBRILLATION (ICD-427.31)      a.  s/p TEE/DCCV 01/13/2009      b.  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 Gerrit Friends 2002 vasectomy Drug eluting stent placed  Family History: Last updated: Feb 19, 2009  Mother died at age 29 of complications of AAA.  Father   is alive at age 24, has a history of prostate cancer and has a history   of coronary disease treated with angioplasty.  The patient has 2   siblings, a twin brother and an older brother, neither whom is known to   have heart disease or prostate problems.   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 Patient currently smokes.  1 cigg every 3 days Alcohol Use - Has consumed as much as 1/2 gallon vodka/day in past, currently at least 3 drinks/day Illicit Drug Use - no  Risk Factors: Alcohol Use: 4+ (02/10/2008)  Risk Factors: Smoking Status: current (02/10/2008)  Review of Systems       negative other than history of present illness  Vital Signs:  Patient profile:   60 year old male Height:      74 inches Weight:      219.75 pounds BMI:     28.32 Pulse rate:   67 / minute Resp:     18 per minute BP sitting:   108 / 72  (left arm)  Vitals Entered By: Celestia Khat, CMA (September 13, 2010 2:30 PM)  Physical Exam  General:  Well developed,  well nourished, in no acute distress. Head:  normocephalic and atraumatic Eyes:  PERRLA/EOM intact; conjunctiva and lids normal. Neck:  Neck supple, no JVD. No masses, thyromegaly or abnormal cervical nodes. Chest Cap Massi:  no deformities or breast masses noted Lungs:  Clear bilaterally to auscultation and percussion. Heart:  PMI nondisplaced, regular rate and rhythm, normal S1-S2, no carotid bruits Abdomen:  slightly distended, good bowel sounds, no tenderness, no obvious liver edge Msk:  Back normal, normal gait. Muscle strength and tone normal. Pulses:  pulses normal in all  4 extremities Extremities:  No clubbing or cyanosis. Neurologic:  Alert and oriented x 3. Skin:  Intact without lesions or rashes. Psych:  anxious.     Impression & Recommendations:  Problem # 1:  CAD, NATIVE VESSEL (ICD-414.01) Assessment Unchanged  His updated medication list for this problem includes:    Metoprolol Succinate 25 Mg Xr24h-tab (Metoprolol succinate) .[name]... 1 tab once daily    Aspirin 325 Mg Tabs (Aspirin) .[name]... Take 1 tablet daily    Sotalol Hcl 120 Mg Tabs (Sotalol hcl) .[name]... 1 tab two times a day    Plavix 75 Mg Tabs (Clopidogrel bisulfate) ..... One tablet by mouth once daily    Nitrostat 0.4 Mg Subl (Nitroglycerin) .[name]... 1 tablet under tongue at onset of chest pain; you may repeat every 5 minutes for up to 3 doses.  Orders: TLB-Hepatic/Liver Function Pnl (80076-HEPATIC)  Problem # 2:  MYOCARDIAL INFARCTION, SUBENDOCARDIAL, SUBSEQUENT CARE (ICD-410.72) Assessment: Unchanged  His updated medication list for this problem includes:    Metoprolol Succinate 25 Mg Xr24h-tab (Metoprolol succinate) .[name]... 1 tab once daily    Aspirin 325 Mg Tabs (Aspirin) .[name]... Take 1 tablet daily    Sotalol Hcl 120 Mg Tabs (Sotalol hcl) .[name]... 1 tab two times a day    Plavix 75 Mg Tabs (Clopidogrel bisulfate) ..... One tablet by mouth once daily    Nitrostat 0.4 Mg Subl (Nitroglycerin) .[name]... 1 tablet under tongue at  onset of chest pain; you may repeat every 5 minutes for up to 3 doses.  Orders: TLB-Hepatic/Liver Function Pnl (80076-HEPATIC)  Problem # 3:  ATRIAL FIBRILLATION (ICD-427.31) Assessment: Improved  His updated medication list for this problem includes:    Metoprolol Succinate 25 Mg Xr24h-tab (Metoprolol succinate) .[name]... 1 tab once daily    Aspirin 325 Mg Tabs (Aspirin) .[name]... Take 1 tablet daily    Sotalol Hcl 120 Mg Tabs (Sotalol hcl) .[name]... 1 tab two times a day    Plavix 75 Mg Tabs (Clopidogrel bisulfate) ..... One tablet by mouth once daily  Problem # 4:  HYPERLIPIDEMIA (ICD-272.4) check LFTs today His updated medication list for this problem includes:    Crestor 20 Mg Tabs (Rosuvastatin calcium) .[name]... Take 1 tablet daily at bedtime  Problem # 5:  ESSENTIAL HYPERTENSION (ICD-401.9) Assessment: Improved  His updated medication list for this problem includes:    Metoprolol Succinate 25 Mg Xr24h-tab (Metoprolol succinate) .[name]... 1 tab once daily    Aspirin 325 Mg Tabs (Aspirin) .[name]... Take 1 tablet daily    Sotalol Hcl 120 Mg Tabs (Sotalol hcl) .[name]... 1 tab two times a day  Patient Instructions: 1)  Your physician recommends that you schedule a follow-up appointment in: 6 months with Dr. Daleen Squibb 2)  Your physician recommends that you continue on your current medications as directed. Please refer to the Current Medication list given to you today. 3)  Your physician recommends that you have for lab work today

## 2010-09-22 NOTE — Progress Notes (Signed)
Summary: PT RTN YOUR CALL  Phone Note Call from Patient Call back at [phone]   Caller: Patient Reason for Call: Talk to Nurse, Talk to Doctor, Lab or Test Results Summary of Call: PT RTN YOUR CALL TO GET LAB RESULTS PLEASE CALL ON NUMBER LISTED ABOVE BECAUSE HE IS NOT AT HOME/LG Initial call taken by: Omer Jack,  September 15, 2010 10:20 AM  Follow-up for Phone Call        [facility]  Mylo Red RN

## 2010-09-23 ENCOUNTER — Encounter (HOSPITAL_COMMUNITY): Payer: 59

## 2010-09-26 ENCOUNTER — Encounter (HOSPITAL_COMMUNITY): Payer: 59

## 2010-09-28 ENCOUNTER — Encounter (HOSPITAL_COMMUNITY): Payer: 59

## 2010-09-30 ENCOUNTER — Encounter (HOSPITAL_COMMUNITY): Payer: Self-pay

## 2010-09-30 ENCOUNTER — Ambulatory Visit: Payer: 59

## 2010-10-03 ENCOUNTER — Encounter (HOSPITAL_COMMUNITY): Payer: Self-pay

## 2010-10-05 ENCOUNTER — Encounter (HOSPITAL_COMMUNITY): Payer: Self-pay

## 2010-10-07 ENCOUNTER — Encounter (HOSPITAL_COMMUNITY): Payer: Self-pay | Attending: Cardiology

## 2010-10-07 DIAGNOSIS — Z7902 Long term (current) use of antithrombotics/antiplatelets: Secondary | ICD-10-CM | POA: Insufficient documentation

## 2010-10-07 DIAGNOSIS — Z9861 Coronary angioplasty status: Secondary | ICD-10-CM | POA: Insufficient documentation

## 2010-10-07 DIAGNOSIS — F1010 Alcohol abuse, uncomplicated: Secondary | ICD-10-CM | POA: Insufficient documentation

## 2010-10-07 DIAGNOSIS — C7982 Secondary malignant neoplasm of genital organs: Secondary | ICD-10-CM | POA: Insufficient documentation

## 2010-10-07 DIAGNOSIS — I4891 Unspecified atrial fibrillation: Secondary | ICD-10-CM | POA: Insufficient documentation

## 2010-10-07 DIAGNOSIS — I10 Essential (primary) hypertension: Secondary | ICD-10-CM | POA: Insufficient documentation

## 2010-10-07 DIAGNOSIS — C801 Malignant (primary) neoplasm, unspecified: Secondary | ICD-10-CM | POA: Insufficient documentation

## 2010-10-07 DIAGNOSIS — Z5189 Encounter for other specified aftercare: Secondary | ICD-10-CM | POA: Insufficient documentation

## 2010-10-07 DIAGNOSIS — Z7982 Long term (current) use of aspirin: Secondary | ICD-10-CM | POA: Insufficient documentation

## 2010-10-07 DIAGNOSIS — I200 Unstable angina: Secondary | ICD-10-CM | POA: Insufficient documentation

## 2010-10-08 ENCOUNTER — Emergency Department (HOSPITAL_COMMUNITY): Payer: 59

## 2010-10-08 ENCOUNTER — Emergency Department (HOSPITAL_COMMUNITY)
Admission: EM | Admit: 2010-10-08 | Discharge: 2010-10-08 | Disposition: A | Discharge status: Home / Self Care | Payer: 59 | Attending: Emergency Medicine | Admitting: Emergency Medicine

## 2010-10-08 DIAGNOSIS — I2510 Atherosclerotic heart disease of native coronary artery without angina pectoris: Secondary | ICD-10-CM | POA: Insufficient documentation

## 2010-10-08 DIAGNOSIS — C61 Malignant neoplasm of prostate: Secondary | ICD-10-CM | POA: Insufficient documentation

## 2010-10-08 DIAGNOSIS — R748 Abnormal levels of other serum enzymes: Secondary | ICD-10-CM | POA: Insufficient documentation

## 2010-10-08 DIAGNOSIS — R109 Unspecified abdominal pain: Secondary | ICD-10-CM | POA: Insufficient documentation

## 2010-10-08 DIAGNOSIS — R9431 Abnormal electrocardiogram [ECG] [EKG]: Secondary | ICD-10-CM | POA: Insufficient documentation

## 2010-10-08 DIAGNOSIS — C7951 Secondary malignant neoplasm of bone: Secondary | ICD-10-CM | POA: Insufficient documentation

## 2010-10-08 DIAGNOSIS — K7689 Other specified diseases of liver: Secondary | ICD-10-CM | POA: Insufficient documentation

## 2010-10-08 DIAGNOSIS — F1010 Alcohol abuse, uncomplicated: Secondary | ICD-10-CM | POA: Insufficient documentation

## 2010-10-08 LAB — COMPREHENSIVE METABOLIC PANEL
Alkaline Phosphatase: 58 U/L (ref 39–117)
BUN: 15 mg/dL (ref 6–23)
Glucose, Bld: 125 mg/dL — ABNORMAL HIGH (ref 70–99)
Potassium: 3.4 mEq/L — ABNORMAL LOW (ref 3.5–5.1)
Total Protein: 7.2 g/dL (ref 6.0–8.3)

## 2010-10-08 LAB — DIFFERENTIAL
Basophils Relative: 0 % (ref 0–1)
Eosinophils Absolute: 0.1 10*3/uL (ref 0.0–0.7)
Lymphs Abs: 1.1 10*3/uL (ref 0.7–4.0)
Monocytes Absolute: 0.3 10*3/uL (ref 0.1–1.0)
Monocytes Relative: 6 % (ref 3–12)
Neutro Abs: 3.6 10*3/uL (ref 1.7–7.7)

## 2010-10-08 LAB — URINALYSIS, ROUTINE W REFLEX MICROSCOPIC
Bilirubin Urine: NEGATIVE
Ketones, ur: NEGATIVE mg/dL
Nitrite: NEGATIVE
Specific Gravity, Urine: 1.034 — ABNORMAL HIGH (ref 1.005–1.030)
Urobilinogen, UA: 0.2 mg/dL (ref 0.0–1.0)

## 2010-10-08 LAB — TROPONIN I: Troponin I: 0.01 ng/mL (ref 0.00–0.06)

## 2010-10-08 LAB — CK TOTAL AND CKMB (NOT AT ARMC)
CK, MB: 3.4 ng/mL (ref 0.3–4.0)
Total CK: 233 U/L — ABNORMAL HIGH (ref 7–232)

## 2010-10-08 LAB — CBC
MCV: 99.7 fL (ref 78.0–100.0)
Platelets: 159 10*3/uL (ref 150–400)
RDW: 13.1 % (ref 11.5–15.5)
WBC: 5.1 10*3/uL (ref 4.0–10.5)

## 2010-10-08 LAB — LIPASE, BLOOD: Lipase: 43 U/L (ref 11–59)

## 2010-10-08 LAB — URINE MICROSCOPIC-ADD ON

## 2010-10-08 LAB — ETHANOL: Alcohol, Ethyl (B): 313 mg/dL — ABNORMAL HIGH (ref 0–10)

## 2010-10-08 MED ORDER — IOHEXOL 300 MG/ML  SOLN
125.0000 mL | Freq: Once | INTRAMUSCULAR | Status: AC | PRN
  Administered 2010-10-08: 125 mL via INTRAVENOUS

## 2010-10-10 ENCOUNTER — Encounter (HOSPITAL_COMMUNITY): Payer: Self-pay

## 2010-10-12 ENCOUNTER — Encounter (HOSPITAL_COMMUNITY): Payer: Self-pay

## 2010-10-14 ENCOUNTER — Encounter (HOSPITAL_COMMUNITY): Payer: Self-pay

## 2010-10-17 ENCOUNTER — Encounter (HOSPITAL_COMMUNITY): Payer: Self-pay

## 2010-10-19 ENCOUNTER — Encounter (HOSPITAL_COMMUNITY): Payer: Self-pay

## 2010-10-21 ENCOUNTER — Encounter (HOSPITAL_COMMUNITY): Payer: Self-pay

## 2010-10-21 LAB — CBC
HCT: 35.4 % — ABNORMAL LOW (ref 39.0–52.0)
HCT: 38.1 % — ABNORMAL LOW (ref 39.0–52.0)
HCT: 36.1 % — ABNORMAL LOW (ref 39.0–52.0)
Hemoglobin: 13 g/dL (ref 13.0–17.0)
MCH: 34.1 pg — ABNORMAL HIGH (ref 26.0–34.0)
MCHC: 34.2 g/dL (ref 30.0–36.0)
MCHC: 33.2 g/dL (ref 30.0–36.0)
MCV: 101.4 fL — ABNORMAL HIGH (ref 78.0–100.0)
MCV: 102.1 fL — ABNORMAL HIGH (ref 78.0–100.0)
MCV: 102.1 fL — ABNORMAL HIGH (ref 78.0–100.0)
MCV: 102.6 fL — ABNORMAL HIGH (ref 78.0–100.0)
MCV: 99.7 fL (ref 78.0–100.0)
Platelets: 105 10*3/uL — ABNORMAL LOW (ref 150–400)
Platelets: 114 10*3/uL — ABNORMAL LOW (ref 150–400)
Platelets: 129 10*3/uL — ABNORMAL LOW (ref 150–400)
RBC: 3.73 MIL/uL — ABNORMAL LOW (ref 4.22–5.81)
RBC: 3.08 MIL/uL — ABNORMAL LOW (ref 4.22–5.81)
RBC: 3.26 MIL/uL — ABNORMAL LOW (ref 4.22–5.81)
RBC: 3.62 MIL/uL — ABNORMAL LOW (ref 4.22–5.81)
RDW: 13.6 % (ref 11.5–15.5)
RDW: 14.2 % (ref 11.5–15.5)
WBC: 6.7 10*3/uL (ref 4.0–10.5)
WBC: 4.3 10*3/uL (ref 4.0–10.5)
WBC: 6.9 10*3/uL (ref 4.0–10.5)

## 2010-10-21 LAB — BASIC METABOLIC PANEL
BUN: 24 mg/dL — ABNORMAL HIGH (ref 6–23)
BUN: 28 mg/dL — ABNORMAL HIGH (ref 6–23)
CO2: 28 mEq/L (ref 19–32)
Calcium: 8.3 mg/dL — ABNORMAL LOW (ref 8.4–10.5)
Chloride: 102 mEq/L (ref 96–112)
Chloride: 103 mEq/L (ref 96–112)
Creatinine, Ser: 1.17 mg/dL (ref 0.4–1.5)
Creatinine, Ser: 1.31 mg/dL (ref 0.4–1.5)
GFR calc Af Amer: >60 mL/min (ref >60)
GFR calc Af Amer: >60 mL/min (ref >60)
GFR calc non Af Amer: 56 mL/min — ABNORMAL LOW (ref >60)
Glucose, Bld: 100 mg/dL — ABNORMAL HIGH (ref 70–99)
Potassium: 4.9 mEq/L (ref 3.5–5.1)
Potassium: 4.8 mEq/L (ref 3.5–5.1)

## 2010-10-21 LAB — COMPREHENSIVE METABOLIC PANEL
ALT: 42 U/L (ref 0–53)
Albumin: 4.2 g/dL (ref 3.5–5.2)
Alkaline Phosphatase: 62 U/L (ref 39–117)
BUN: 25 mg/dL — ABNORMAL HIGH (ref 6–23)
Chloride: 100 mEq/L (ref 96–112)
Potassium: 4.2 mEq/L (ref 3.5–5.1)
Sodium: 135 mEq/L (ref 135–145)
Total Bilirubin: 0.8 mg/dL (ref 0.3–1.2)

## 2010-10-21 LAB — CARDIAC PANEL(CRET KIN+CKTOT+MB+TROPI)
CK, MB: 2.4 ng/mL (ref 0.3–4.0)
Relative Index: 2.3 (ref 0.0–2.5)
Relative Index: 2.4 (ref 0.0–2.5)
Relative Index: INVALID (ref 0.0–2.5)
Total CK: 102 U/L (ref 7–232)
Troponin I: 0.02 ng/mL (ref 0.00–0.06)
Troponin I: 0.02 ng/mL (ref 0.00–0.06)

## 2010-10-21 LAB — AMYLASE: Amylase: 58 U/L (ref 0–105)

## 2010-10-21 LAB — LIPASE, BLOOD: Lipase: 32 U/L (ref 11–59)

## 2010-10-21 LAB — VITAMIN B12: Vitamin B-12: 602 pg/mL (ref 211–911)

## 2010-10-21 LAB — PROTIME-INR: INR: 1.02 (ref 0.00–1.49)

## 2010-10-24 ENCOUNTER — Encounter (HOSPITAL_COMMUNITY): Payer: Self-pay

## 2010-10-26 ENCOUNTER — Encounter (HOSPITAL_COMMUNITY): Payer: Self-pay

## 2010-10-28 ENCOUNTER — Encounter (HOSPITAL_COMMUNITY): Payer: Self-pay

## 2010-10-31 ENCOUNTER — Encounter (HOSPITAL_COMMUNITY): Payer: Self-pay

## 2010-11-02 ENCOUNTER — Encounter (HOSPITAL_COMMUNITY): Payer: Self-pay

## 2010-11-04 ENCOUNTER — Encounter (HOSPITAL_COMMUNITY): Payer: Self-pay

## 2010-11-07 ENCOUNTER — Encounter (HOSPITAL_COMMUNITY): Payer: Self-pay | Attending: Cardiology

## 2010-11-07 DIAGNOSIS — Z5189 Encounter for other specified aftercare: Secondary | ICD-10-CM | POA: Insufficient documentation

## 2010-11-07 DIAGNOSIS — I4891 Unspecified atrial fibrillation: Secondary | ICD-10-CM | POA: Insufficient documentation

## 2010-11-07 DIAGNOSIS — Z9861 Coronary angioplasty status: Secondary | ICD-10-CM | POA: Insufficient documentation

## 2010-11-07 DIAGNOSIS — C7982 Secondary malignant neoplasm of genital organs: Secondary | ICD-10-CM | POA: Insufficient documentation

## 2010-11-07 DIAGNOSIS — C801 Malignant (primary) neoplasm, unspecified: Secondary | ICD-10-CM | POA: Insufficient documentation

## 2010-11-07 DIAGNOSIS — Z7982 Long term (current) use of aspirin: Secondary | ICD-10-CM | POA: Insufficient documentation

## 2010-11-07 DIAGNOSIS — F1010 Alcohol abuse, uncomplicated: Secondary | ICD-10-CM | POA: Insufficient documentation

## 2010-11-07 DIAGNOSIS — I200 Unstable angina: Secondary | ICD-10-CM | POA: Insufficient documentation

## 2010-11-07 DIAGNOSIS — I10 Essential (primary) hypertension: Secondary | ICD-10-CM | POA: Insufficient documentation

## 2010-11-07 DIAGNOSIS — Z7902 Long term (current) use of antithrombotics/antiplatelets: Secondary | ICD-10-CM | POA: Insufficient documentation

## 2010-11-09 ENCOUNTER — Encounter (HOSPITAL_COMMUNITY): Payer: Self-pay

## 2010-11-11 ENCOUNTER — Encounter (HOSPITAL_COMMUNITY): Payer: Self-pay

## 2010-11-14 ENCOUNTER — Encounter (HOSPITAL_COMMUNITY): Payer: Self-pay

## 2010-11-14 LAB — CBC
HCT: 34.2 % — ABNORMAL LOW (ref 39.0–52.0)
HCT: 35.3 % — ABNORMAL LOW (ref 39.0–52.0)
HCT: 36.5 % — ABNORMAL LOW (ref 39.0–52.0)
HCT: 41.3 % (ref 39.0–52.0)
Hemoglobin: 12.3 g/dL — ABNORMAL LOW (ref 13.0–17.0)
Hemoglobin: 12.5 g/dL — ABNORMAL LOW (ref 13.0–17.0)
Hemoglobin: 12.9 g/dL — ABNORMAL LOW (ref 13.0–17.0)
Hemoglobin: 14.1 g/dL (ref 13.0–17.0)
Hemoglobin: 14.7 g/dL (ref 13.0–17.0)
MCHC: 35.3 g/dL (ref 30.0–36.0)
MCHC: 35.4 g/dL (ref 30.0–36.0)
MCHC: 35.9 g/dL (ref 30.0–36.0)
MCV: 100.1 fL — ABNORMAL HIGH (ref 78.0–100.0)
MCV: 101.1 fL — ABNORMAL HIGH (ref 78.0–100.0)
MCV: 99.7 fL (ref 78.0–100.0)
Platelets: 159 10*3/uL (ref 150–400)
Platelets: 169 10*3/uL (ref 150–400)
Platelets: 175 10*3/uL (ref 150–400)
Platelets: 197 10*3/uL (ref 150–400)
RBC: 3.98 MIL/uL — ABNORMAL LOW (ref 4.22–5.81)
RDW: 13.1 % (ref 11.5–15.5)
RDW: 13.2 % (ref 11.5–15.5)
RDW: 13.3 % (ref 11.5–15.5)
RDW: 13.3 % (ref 11.5–15.5)
WBC: 6.7 10*3/uL (ref 4.0–10.5)

## 2010-11-14 LAB — BASIC METABOLIC PANEL
BUN: 22 mg/dL (ref 6–23)
Chloride: 104 mEq/L (ref 96–112)
Glucose, Bld: 97 mg/dL (ref 70–99)
Potassium: 4.9 mEq/L (ref 3.5–5.1)
Sodium: 137 mEq/L (ref 135–145)

## 2010-11-14 LAB — COMPREHENSIVE METABOLIC PANEL
ALT: 35 U/L (ref 0–53)
AST: 49 U/L — ABNORMAL HIGH (ref 0–37)
Albumin: 4.3 g/dL (ref 3.5–5.2)
Alkaline Phosphatase: 57 U/L (ref 39–117)
GFR calc Af Amer: >60 mL/min (ref >60)
Potassium: 4.6 mEq/L (ref 3.5–5.1)
Sodium: 140 mEq/L (ref 135–145)
Total Protein: 7.9 g/dL (ref 6.0–8.3)

## 2010-11-14 LAB — LIPID PANEL
Total CHOL/HDL Ratio: 5.6 RATIO
Triglycerides: 313 mg/dL — ABNORMAL HIGH (ref <150)
VLDL: 63 mg/dL — ABNORMAL HIGH (ref 0–40)

## 2010-11-14 LAB — TSH: TSH: 2.431 u[IU]/mL (ref 0.350–4.500)

## 2010-11-14 LAB — CARDIAC PANEL(CRET KIN+CKTOT+MB+TROPI)
CK, MB: 1.8 ng/mL (ref 0.3–4.0)
Troponin I: 0.02 ng/mL (ref 0.00–0.06)
Troponin I: 0.03 ng/mL (ref 0.00–0.06)

## 2010-11-14 LAB — TROPONIN I: Troponin I: 0.02 ng/mL (ref 0.00–0.06)

## 2010-11-14 LAB — DIFFERENTIAL
Basophils Relative: 1 % (ref 0–1)
Eosinophils Absolute: 0.2 10*3/uL (ref 0.0–0.7)
Monocytes Absolute: 0.5 10*3/uL (ref 0.1–1.0)
Monocytes Relative: 6 % (ref 3–12)

## 2010-11-14 LAB — HEPARIN LEVEL (UNFRACTIONATED): Heparin Unfractionated: 0.31 IU/mL (ref 0.30–0.70)

## 2010-11-14 LAB — PROTIME-INR: INR: 1 (ref 0.00–1.49)

## 2010-11-14 LAB — CK TOTAL AND CKMB (NOT AT ARMC)
CK, MB: 2.4 ng/mL (ref 0.3–4.0)
Relative Index: 1.8 (ref 0.0–2.5)

## 2010-11-14 LAB — APTT: aPTT: 27 seconds (ref 24–37)

## 2010-11-15 LAB — POCT CARDIAC MARKERS
CKMB, poc: 2 ng/mL (ref 1.0–8.0)
Myoglobin, poc: 89.2 ng/mL (ref 12–200)
Troponin i, poc: <0.05 ng/mL (ref 0.00–0.09)
Troponin i, poc: <0.05 ng/mL (ref 0.00–0.09)

## 2010-11-15 LAB — DIFFERENTIAL
Basophils Relative: 1 % (ref 0–1)
Eosinophils Absolute: 0.4 10*3/uL (ref 0.0–0.7)
Eosinophils Relative: 6 % — ABNORMAL HIGH (ref 0–5)
Lymphs Abs: 1.5 10*3/uL (ref 0.7–4.0)
Monocytes Relative: 8 % (ref 3–12)
Neutrophils Relative %: 61 % (ref 43–77)

## 2010-11-15 LAB — CBC
HCT: 34.8 % — ABNORMAL LOW (ref 39.0–52.0)
MCHC: 35.3 g/dL (ref 30.0–36.0)
MCV: 99.3 fL (ref 78.0–100.0)
Platelets: 171 10*3/uL (ref 150–400)
RBC: 3.51 MIL/uL — ABNORMAL LOW (ref 4.22–5.81)

## 2010-11-15 LAB — POCT I-STAT, CHEM 8
Creatinine, Ser: 1.1 mg/dL (ref 0.4–1.5)
Glucose, Bld: 93 mg/dL (ref 70–99)
HCT: 34 % — ABNORMAL LOW (ref 39.0–52.0)
Hemoglobin: 11.6 g/dL — ABNORMAL LOW (ref 13.0–17.0)
Potassium: 4.3 mEq/L (ref 3.5–5.1)
TCO2: 25 mmol/L (ref 0–100)

## 2010-11-15 LAB — URINALYSIS, ROUTINE W REFLEX MICROSCOPIC
Bilirubin Urine: NEGATIVE
Hgb urine dipstick: NEGATIVE
Ketones, ur: NEGATIVE mg/dL
Nitrite: NEGATIVE
Protein, ur: NEGATIVE mg/dL
Specific Gravity, Urine: 1.017 (ref 1.005–1.030)
Urobilinogen, UA: 0.2 mg/dL (ref 0.0–1.0)

## 2010-11-15 LAB — URINE CULTURE

## 2010-11-16 ENCOUNTER — Encounter (HOSPITAL_COMMUNITY): Payer: Self-pay

## 2010-11-18 ENCOUNTER — Encounter (HOSPITAL_COMMUNITY): Payer: Self-pay

## 2010-11-21 ENCOUNTER — Encounter (HOSPITAL_COMMUNITY): Payer: Self-pay

## 2010-11-23 ENCOUNTER — Encounter (HOSPITAL_COMMUNITY): Payer: Self-pay

## 2010-11-25 ENCOUNTER — Encounter (HOSPITAL_COMMUNITY): Payer: Self-pay

## 2010-11-26 ENCOUNTER — Other Ambulatory Visit: Payer: Self-pay | Admitting: Gastroenterology

## 2010-11-28 ENCOUNTER — Encounter (HOSPITAL_COMMUNITY): Payer: Self-pay

## 2010-11-30 ENCOUNTER — Encounter (HOSPITAL_COMMUNITY): Payer: Self-pay

## 2010-12-02 ENCOUNTER — Encounter (HOSPITAL_COMMUNITY): Payer: Self-pay

## 2010-12-05 ENCOUNTER — Encounter (HOSPITAL_COMMUNITY): Payer: Self-pay

## 2010-12-07 ENCOUNTER — Encounter (HOSPITAL_COMMUNITY): Payer: Self-pay | Attending: Cardiology

## 2010-12-07 DIAGNOSIS — Z9861 Coronary angioplasty status: Secondary | ICD-10-CM | POA: Insufficient documentation

## 2010-12-07 DIAGNOSIS — I200 Unstable angina: Secondary | ICD-10-CM | POA: Insufficient documentation

## 2010-12-07 DIAGNOSIS — F1010 Alcohol abuse, uncomplicated: Secondary | ICD-10-CM | POA: Insufficient documentation

## 2010-12-07 DIAGNOSIS — Z7982 Long term (current) use of aspirin: Secondary | ICD-10-CM | POA: Insufficient documentation

## 2010-12-07 DIAGNOSIS — Z5189 Encounter for other specified aftercare: Secondary | ICD-10-CM | POA: Insufficient documentation

## 2010-12-07 DIAGNOSIS — Z7902 Long term (current) use of antithrombotics/antiplatelets: Secondary | ICD-10-CM | POA: Insufficient documentation

## 2010-12-07 DIAGNOSIS — C801 Malignant (primary) neoplasm, unspecified: Secondary | ICD-10-CM | POA: Insufficient documentation

## 2010-12-07 DIAGNOSIS — I10 Essential (primary) hypertension: Secondary | ICD-10-CM | POA: Insufficient documentation

## 2010-12-07 DIAGNOSIS — I4891 Unspecified atrial fibrillation: Secondary | ICD-10-CM | POA: Insufficient documentation

## 2010-12-07 DIAGNOSIS — C7982 Secondary malignant neoplasm of genital organs: Secondary | ICD-10-CM | POA: Insufficient documentation

## 2010-12-09 ENCOUNTER — Encounter (HOSPITAL_COMMUNITY): Payer: Self-pay

## 2010-12-12 ENCOUNTER — Encounter (HOSPITAL_COMMUNITY): Payer: Self-pay

## 2010-12-14 ENCOUNTER — Encounter (HOSPITAL_COMMUNITY): Payer: Self-pay

## 2010-12-16 ENCOUNTER — Encounter (HOSPITAL_COMMUNITY): Payer: Self-pay

## 2010-12-19 ENCOUNTER — Encounter (HOSPITAL_COMMUNITY): Payer: Self-pay

## 2010-12-20 ENCOUNTER — Ambulatory Visit (INDEPENDENT_AMBULATORY_CARE_PROVIDER_SITE_OTHER): Payer: 59 | Admitting: Gastroenterology

## 2010-12-20 ENCOUNTER — Encounter: Payer: Self-pay | Admitting: Gastroenterology

## 2010-12-20 VITALS — BP 110/82 | HR 76 | Ht 74.0 in | Wt 217.4 lb

## 2010-12-20 DIAGNOSIS — G8929 Other chronic pain: Secondary | ICD-10-CM

## 2010-12-20 DIAGNOSIS — F1020 Alcohol dependence, uncomplicated: Secondary | ICD-10-CM

## 2010-12-20 DIAGNOSIS — M549 Dorsalgia, unspecified: Secondary | ICD-10-CM

## 2010-12-20 DIAGNOSIS — C801 Malignant (primary) neoplasm, unspecified: Secondary | ICD-10-CM

## 2010-12-20 DIAGNOSIS — I2510 Atherosclerotic heart disease of native coronary artery without angina pectoris: Secondary | ICD-10-CM

## 2010-12-20 DIAGNOSIS — C61 Malignant neoplasm of prostate: Secondary | ICD-10-CM

## 2010-12-20 DIAGNOSIS — K7010 Alcoholic hepatitis without ascites: Secondary | ICD-10-CM

## 2010-12-20 DIAGNOSIS — I519 Heart disease, unspecified: Secondary | ICD-10-CM

## 2010-12-20 DIAGNOSIS — Z9861 Coronary angioplasty status: Secondary | ICD-10-CM

## 2010-12-20 MED ORDER — CELECOXIB 200 MG PO CAPS: 200.0000 mg | ORAL_CAPSULE | Freq: Two times a day (BID) | ORAL | Status: AC

## 2010-12-20 MED ORDER — OMEPRAZOLE 40 MG PO CPDR: 40.0000 mg | DELAYED_RELEASE_CAPSULE | Freq: Every day | ORAL | Status: DC

## 2010-12-20 NOTE — Discharge Summary (Signed)
NAMEKAYDE, WAREHIME                ACCOUNT NO.:  [id number]   MEDICAL RECORD NO.:  [id number]          PATIENT TYPE:  INP   LOCATION:  3739                         FACILITY:  [facility]   PHYSICIAN:  Bevelyn Buckles. Bensimhon, MDDATE OF BIRTH:  12/14/1950   DATE OF ADMISSION:  01/11/2009  DATE OF DISCHARGE:  01/15/2009                               DISCHARGE SUMMARY   PRIMARY CARDIOLOGIST:  Maisie Fus C. Daleen Squibb, MD, [facility]   PRIMARY CARE PHYSICIAN:  Vania Rea. Jarold Motto, MD, [facility], FACP, FAGA of GI.   DISCHARGE DIAGNOSES:  1. Paroxysmal atrial fibrillation, status post transesophageal      echocardiography/cardioversion (maintaining sinus rhythm on      flecainide, Coumadin with Lovenox bridge).  2. Hyperlipidemia.   SECONDARY DIAGNOSES:  1. Essential hypertension.  2. EtOH abuse.  3. Metastatic prostate cancer.   ALLERGIES:  NKDA.   PROCEDURES PERFORMED DURING THIS HOSPITALIZATION:  They are as follows:  1. EKG showing a rhythm of atrial fibrillation and ventricular      response rate of 151, however, no acute ST - T wave changes, no      significant Q waves, normal axis, no evidence of hypertrophy.  QTc      456.  2. Chest x-ray on January 11, 2009, showing no acute cardiopulmonary      process.  3. A 2-D echocardiogram completed on January 12, 2009, showing left      ventricular wall thickness increased in pattern of mild left      ventricular hypertrophy, ejection fraction estimated in range of 55-      60%, wall motion normal.  Calcified mitral annulus.  Left atrium      mildly dilated.  Trivial tricuspid regurgitation.  4. Transesophageal echocardiogram completed on January 13, 2009, showing      mild left atrial dilatation without evidence of thrombus in the      atrial cavity or appendage.  Right atrium also without evidence of      thrombus.  5. Cardioversion completed on January 13, 2009, involving single 150 -      joule biphasic synchronized shock with prompt conversion to sinus      rhythm.  No  complications.  6. EKG performed on January 13, 2009, showing sinus rhythm at a rate of      76, no acute ST - T wave changes, no significant Q waves, slight      left axis deviation, and no evidence of hypertrophy, PR 166, QRS      84, and QTc 438.  7. EKG performed on January 14, 2009,, showing normal sinus rhythm at a      rate of 77, no significant changes from prior tracing other than      normal axis.  8. EKG performed on January 15, 2009, showing normal sinus rhythm, no      significant changes from prior tracing.   HISTORY OF PRESENT ILLNESS:  Mr. Forcier is a 60 year old Caucasian male,  who was recently seen by Dr. Jarold Motto at the [facility] and was found  to be in atrial fibrillation.  He  was sent to the ER, but by the time,  he arrived to his back in sinus rhythm, and was sent home and advised to  continue his Toprol and aspirin daily.  Unfortunately, he continues to  consume 2-3 alcoholic beverages per day and did not start taking his  aspirin.  Although he denies any symptoms, when he was seen at [facility], he was found to be in atrial fibrillation with rapid  ventricular response into the 170s.  The plan at that time was to admit  the patient to [facility] after starting an IV and initiating  IV Lopressor therapy.   HOSPITAL COURSE:  The patient admitted as above, underwent procedures as  described above without significant complications.  The patient  continued in atrial fibrillation, without evidence of atrial thrombus,  and therefore underwent successful cardioversion as described above.  The patient maintained sinus rhythm for the duration of his hospital  course.  Coumadin therapy was initiated after a long  discussion/counseling as to the importance of total EtOH cessation.  The  patient is agreeable and therefore Coumadin therapy will proceed as  planned.  As INR is subtherapeutic, the patient will be discharged on IV  Lovenox.  Dosing of Coumadin and  Lovenox was discussed with pharmacy.   The patient will have followup appointment at [facility] on January 18, 2009.  The patient also has  scheduled followup appointment with Nicolasa Ducking, ANP at [facility] on January 27, 2009.   At the time of discharge, the patient will be given his new medication  list, new prescriptions, and his followup instructions.  All questions  or concerns should be addressed at that time.   DISCHARGE LABORATORIES:  WBC 6.5, HGB 12.5, HCT 35.3, and PLT count 169.  PT 16.1 and INR 1.3.  Sodium 137, potassium 4.9, chloride 104, CO2 of  27, BUN 22, creatinine 1.15, glucose 97, and calcium 9.7.  BNP was 218.  The patient had 3 sets of negative cardiac enzymes.  TSH was 2.431,  total cholesterol was 318, triglyceride was 313, HDL was 57, LDL was  198, and VLDL was 63.   FOLLOWUP PLANS AND APPOINTMENTS:  See hospital course.   DISCHARGE MEDICATIONS:  1. Multivitamin p.o. daily.  2. Folic acid 1 mg p.o. daily.  3. Metoprolol succinate 50 mg p.o. daily.  4. Vitamin D plus D 400 p.o. daily.  5. Calcium 500/D 500 - 200 mg p.o. daily.  6. Lovenox 150 mg subcu injection daily.  7. Coumadin 6 mg p.o. daily.  8. Flecainide 100 mg p.o. b.i.d.  9. Simvastatin 40 mg p.o. daily.   DURATION OF DISCHARGE/ENCOUNTER INCLUDING PHYSICIAN TIME:  45 minutes.      Jarrett Ables, [facility]      Daniel R. Bensimhon, MD  Electronically Signed    MS/MEDQ  D:  01/15/2009  T:  01/16/2009  Job:  [id number]   cc:   Nicolasa Ducking, ANP  Vania Rea. Jarold Motto, MD, Caleen Essex, FAGA  Bevelyn Buckles. Bensimhon, MD
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
## 2010-12-20 NOTE — Patient Instructions (Signed)
Your prescription(s) have been sent to you pharmacy.   
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
## 2010-12-20 NOTE — Progress Notes (Signed)
This is a 60 year old Caucasian male with chronic alcoholic liver disease and alcoholic hepatitis but no evidence of cirrhosis. He continues to drink 5-6 times a day but denies problems with social control or family issues. To me, he denies significant depression. He has metastatic prostate cancer and receives monthly hormonal injection therapy per Dr. Patsi Sears  Urology. He additionally has coronary artery disease with a drug eluting stent And Is Followed by Dr. Valera Castle, and is on Plavix and aspirin. He denies any current cardiovascular or pulmonary complaints and does not use nitroglycerin.  His main complaint is back pain with arthralgias in his hands and neck. He developed urticaria with Nucynta narcotic control of his back pain. He currently is using when necessary Aleve. He denies dyspepsia or reflux symptoms, melena or hematochezia. Dr. Cyndie Chime as his oncology physician. View of recent lab shows continued alcoholic hepatitis, but the patient denies right upper quadrant pain, nausea vomiting, anorexia or weight loss. He is going to cardiac rehabilitation a regular basis and is able to control his weight without difficulties. He has refused psychological evaluation.  Current Medications, Allergies, Past Medical History, Past Surgical History, Family History and Social History were reviewed in [employer] record.  Pertinent Review of Systems Negative,,, the current cardiopulmonary or urologic symptoms.   Physical Exam: Awake alert no acute distress appearing her stated age. I cannot appreciate stigmata of chronic liver disease. Chest is clear  To be in a regular rhythm without murmurs gallops or rubs. There is no hepatosplenomegaly, abdominal masses or tenderness. Mental status is clear and there are no gross neurologic deficits.    Assessment and Plan: Allen Schroeder is doing well with alcohol moderation. He has no desire for complete abstinence and has been to inpatient  therapy on several occasions. There is no evidence of symptomatic liver disease or portal hypertension or mental status changes. His cardiovascular symptoms seem well controlled, but I'm concerned with his use of NSAIDs with daily aspirin and Plavix. I placed him on omeprazole 40 mg a day will let him try Celebrex 200 mg twice a day for his musculo- skeletal pain related to his prostate cancer and metastasis. I have urged him to continue followup with Dr. Daleen Squibb, Dr. Patsi Sears, and Dr. Cyndie Chime. Alcohol moderation again advised. The patient does not feel that he would benefit from psychological or psychiatric counseling. Overall, think this patient is doing extremely well with his multiple serious comorbid medical problems. I will see him every 6 months or when necessary as needed. I did not check additional laboratory data today.  Please copy her primary care physician, referring physician, and pertinent subspecialists.

## 2010-12-20 NOTE — Op Note (Signed)
NAMENOCHUM, FENTER                ACCOUNT NO.:  [id number]   MEDICAL RECORD NO.:  [id number]          PATIENT TYPE:  INP   LOCATION:  2918                         FACILITY:  [facility]   PHYSICIAN:  Bevelyn Buckles. Bensimhon, MDDATE OF BIRTH:  21-May-1951   DATE OF PROCEDURE:  DATE OF DISCHARGE:                               OPERATIVE REPORT   PROCEDURE:  Transesophageal echocardiogram-guided cardioversion.   INDICATION:  Recurrent symptomatic atrial fibrillation.   The patient underwent TEE, which showed EF of 55% with mild mitral  regurgitation.  There was no left atrial appendage thrombus.   Anesthesia then sedated him further and he underwent cardioversion with  a single 150-joule biphasic synchronized shock with prompt conversion to  sinus rhythm.  There were no apparent complications.  He will maintain  on heparin and loading Coumadin.  He will start flecainide to hopefully  prevent recurrent atrial fibrillation.      Bevelyn Buckles. Bensimhon, MD  Electronically Signed     DRB/MEDQ  D:  01/13/2009  T:  01/13/2009  Job:  [id number]

## 2010-12-20 NOTE — Assessment & Plan Note (Signed)
HEALTHCARE                         GASTROENTEROLOGY OFFICE NOTE   VAHAN, WADSWORTH                         MRN:          [id number]  DATE:11/07/2007                            DOB:          Aug 21, 1950    CONTINUATION   [name] readily admits continuing to drink alcohol daily and heavily  and to swallow mouthwash, because of its high alcohol content.  He has  severe anxiety but denies seizures or delirium tremors.  He does have a  familial tremor, which has been worse over the last year.  Apparently,  his father has been bothered also with a tremor.  He, in the past, has  had some sexual contact but has never had known hepatitis or viral  infections, but has not been screened with serologies.  His appetite is  good, and his weight is stable.  He denies specific food intolerances.  He has not had previous endoscopy, gives no history of acid reflux,  bowel irregularity, melena or hematochezia.  He has no systemic  complaints of fever, chills, skin rashes, joint pains, oral stomatitis,  etc.  Overall feels well but is very anxious and nervous and apparently  at work.  He is carrying the load of his company and supported several  people.  As part of his job, he does have to socialize and entertain.  This is another trigger for drinking alcohol.   PAST MEDICAL HISTORY:  The patient had cholecystectomy performed by Dr.  Gerrit Friends, October 2002.  He otherwise has no history of medical problems,  except for some mild essential hypertension.  During his recent  colonoscopy, his blood pressure was markedly and persistently elevated.   MEDICATIONS:  None.   ALLERGIES:  NONE.   SOCIAL HISTORY:  Married, lives with his wife and has 3 children, one  grandchild.  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  He does smoke cigars but not  cigarettes.  He has a heavy alcohol intake, as mentioned and outlined  above.   REVIEW OF SYSTEMS:  Otherwise, noncontributory.  He specifically denies  any suicidal thoughts, hallucinations or delusions.  He denies pruritus,  mental status changes, nausea and vomiting, abdominal pain.  Review of  systems otherwise noncontributory.   LABORATORY DATA:  Recent lab data showed a normal celiac panel, CDT  level of 1.4, normal less than 1.6, normal CBC and sed rate, normal pro-  thrombin time, normal electrolyte panel but a low calcium of 8.3 mg  percent, with serum albumin of 3.9 grams percent.  Total bilirubin was  elevated at 1.3, and SGOT was 62, SGPT 32.  Amylase, lipase, and TSH  levels were normal.  Sigmoid colon biopsies showed no evidence of  colitis, and he did have a tubulo-adenoma removed from his colon.   EXAMINATION:  GENERAL:  He is a healthy-appearing white male, somewhat  tremulous but awake and alert and oriented with no asterixis or focal  neurologic deficits.  VITAL SIGNS:  He is 6 feet 2 and weighs 217 pounds.  His blood pressure  is 130/94, and pulse was 84 and regular.   General physical exam was not repeated, but he had physical exam at the  time of his colonoscopy , which was unremarkable without organomegaly or  other abnormalities.   ASSESSMENT:  1. Alcoholism with continued alcohol use, despite previous inpatient      30-day treatment program and outpatient treatment several years      ago.  2. Severe chronic anxiety syndrome with questionable history of      previous alcohol discontinuation with Klonopin therapy.  3. Acute alcoholic hepatitis.  4. Essential hypertension.  5. Mild hypocalcemia, probably related to decreased protein levels.  6. Familial tremor, probably exacerbated by alcoholism.  7. Diverticulosis and colon adenomas.   RECOMMENDATIONS:  1. Check hepatitis C antibody, hepatitis B surface antigen and HIV      titer.  Also, would check serum ammonia level.  2. Trial of Klonopin 1 mg 3 times a day with strict  alcohol      discontinuation.  3. Coenzyme Q multivitamins a day, along with folic acid 1 mg a day.  4. Diovan 80 mg a day for blood pressure control.  5. Bene-fiber one tablespoon with cereal each day and high-fiber diet.  6. GI followup in three weeks' time with liver profile before his      visit.  7. I have talked approximately 30 minutes with the patient's wife      today on the phone, concerning his case, and our plans for followup      and her cooperation and communication.  I spent approximately an      hour today talking with the patient and counseling him.     Vania Rea. Jarold Motto, MD, Caleen Essex, FAGA  Electronically Signed    DRP/MedQ  DD: 11/07/2007  DT: 11/07/2007  Job #: [id number]

## 2010-12-20 NOTE — Assessment & Plan Note (Signed)
[facility] HEALTHCARE                         GASTROENTEROLOGY OFFICE NOTE   THADDEAUS, MONICA                         MRN:          [id number]  DATE:11/07/2007                            DOB:          07/23/1951    [name] is a 60 year old white male who I originally saw recently  because of a screening colonoscopy.  I have since had conversation with  the patient's wife and it is obvious that he has had problems with  alcoholism and he returned today to discuss condition and to follow up  on abnormal liver function tests and blood work drawn at the time of his  initial colonoscopy, which was performed on October 30, 2007.   HISTORY OF PRESENT ILLNESS:  Mr. [name] has been in good health all of  his life and has had alcoholism problems for the last 5 years and has  been to a 30-day inpatient treatment facility at [facility]  in [place].  He has had previous psychiatric evaluations by Dr.  Betti Cruz and by Dr. Eula Fried at [facility].  The patient is  attending Alcoholics Anonymous meetings and has outpatient therapy for  alcoholism at [facility], but continues to drink rather regularly.  His marriage has suffered because of his drinking and there is a lot of  trnsion between he and his wife.   Mr. Graham relates that when he was on Klonopin, he was completely  abstinent from alcohol and he has a severe underlying chronic anxiety  syndrome with free floating anxiety but no elements of depression.  He  relates that he has been tried on Campral and Inderal and Zoloft without  any success in treating his anxiety syndrome or his alcoholism.  He  drinks every day and in addition to Vodka drinks a large amount of  mouthwash to try to hide his drinking from his wife.   Mr. [name] readily admits continuing to drink alcohol daily and heavily  and to swallow mouthwash, because of its high alcohol content.  He has  severe anxiety but denies  seizures or delirium tremors.  He does have a  familial tremor, which has been worse over the last year.  Apparently,  his father has been bothered also with a tremor.  He, in the past, has  had some sexual contact but has never had known hepatitis or viral  infections, but has not been screened with serologies.  His appetite is  good, and his weight is stable.  He denies specific food intolerances.  He has not had previous endoscopy, gives no history of acid reflux,  bowel irregularity, melena or hematochezia.  He has no systemic  complaints of fever, chills, skin rashes, joint pains, oral stomatitis,  etc.  Overall feels well but is very anxious and nervous and apparently  at work.  He is carrying the load of his company and supported several  people.  As part of his job, he does have to socialize and entertain.  This is another trigger for drinking alcohol.   PAST MEDICAL HISTORY:  The patient had cholecystectomy performed by  Dr.  Gerrit Friends, October 2002.  He otherwise has no history of medical problems,  except for some mild essential hypertension.  During his recent  colonoscopy, his blood pressure was markedly and persistently elevated.   MEDICATIONS:  None.   ALLERGIES:  NONE.   SOCIAL HISTORY:  Married, lives with his wife and has 3 children, one  grandchild.  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  He does smoke cigars but not  cigarettes.  He has a heavy alcohol intake, as mentioned and outlined  above.   REVIEW OF SYSTEMS:  Otherwise, noncontributory.  He specifically denies  any suicidal thoughts, hallucinations or delusions.  He denies pruritus,  mental status changes, nausea and vomiting, abdominal pain.  Review of  systems otherwise noncontributory.   LABORATORY DATA:  Recent lab data showed a normal celiac panel, CDT  level of 1.4, normal less than 1.6, normal CBC and sed rate, normal pro-  thrombin time, normal  electrolyte panel but a low calcium of 8.3 mg  percent, with serum albumin of 3.9 grams percent.  Total bilirubin was  elevated at 1.3, and SGOT was 62, SGPT 32.  Amylase, lipase, and TSH  levels were normal.  Sigmoid colon biopsies showed no evidence of  colitis, and he did have a tubulo-adenoma removed from his colon.   EXAMINATION:  GENERAL:  He is a healthy-appearing white male, somewhat  tremulous but awake and alert and oriented with no asterixis or focal  neurologic deficits.  VITAL SIGNS:  He is 6 feet 2 and weighs 217 pounds.  His blood pressure  is 130/94, and pulse was 84 and regular.   General physical exam was not repeated, but he had physical exam at the  time of his colonoscopy , which was unremarkable without organomegaly or  other abnormalities.   ASSESSMENT:  1. Alcoholism with continued alcohol use, despite previous inpatient      30-day treatment program and outpatient treatment several years      ago.  2. Severe chronic anxiety syndrome with questionable history of      previous alcohol discontinuation with Klonopin therapy.  3. Acute alcoholic hepatitis.  4. Essential hypertension.  5. Mild hypocalcemia, probably related to decreased protein levels.  6. Familial tremor, probably exacerbated by alcoholism.  7. Diverticulosis and colon adenomas.   RECOMMENDATIONS:  1. Check hepatitis C antibody, hepatitis B surface antigen and HIV      titer.  Also, would check serum ammonia level.  2. Trial of Klonopin 1 mg 3 times a day with strict alcohol      discontinuation.  3. Coenzyme Q multivitamins a day, along with folic acid 1 mg a day.  4. Diovan 80 mg a day for blood pressure control.  5. Bene-fiber one tablespoon with cereal each day and high-fiber diet.  6. GI followup in three weeks' time with liver profile before his      visit.  7. I have talked approximately 30 minutes with the patient's wife      today on the phone,      concerning his case, and our  plans for followup and her cooperation      and communication.  I spent approximately an hour today talking      with the patient and counseling him.     Vania Rea. Jarold Motto, MD, Caleen Essex, FAGA  Electronically Signed    DRP/MedQ  DD: 11/07/2007  DT: 11/07/2007  Job #: [id number]

## 2010-12-21 ENCOUNTER — Encounter (HOSPITAL_COMMUNITY): Payer: Self-pay

## 2010-12-23 ENCOUNTER — Encounter (HOSPITAL_COMMUNITY): Payer: Self-pay

## 2010-12-23 NOTE — Op Note (Signed)
[facility]  Patient:    Allen Schroeder, Allen Schroeder Visit Number: [id number] MRN: [id number]          Service Type: MED Location: 3W [phone] Attending Physician:  Bonnetta Barry Proc. Date: 05/31/01 Admit Date:  05/29/2001 Discharge Date: 06/03/2001   CC:         Meade Maw, M.D.   Operative Report  PREOPERATIVE DIAGNOSES:  Acute cholecystitis, cholelithiasis.  POSTOPERATIVE DIAGNOSES:  Acute cholecystitis, cholelithiasis.  PROCEDURE:  Laparoscopic cholecystectomy with intraoperative cholangiography.  SURGEON:  [name], M.D.  ASSISTANTDonnella Bi D. Pendse, M.D.  ANESTHESIA:  General per Lucille Passy, M.D.  ESTIMATED BLOOD LOSS:  Less than 100 cc.  PREPARATION:  Betadine.  COMPLICATIONS:  None.  INDICATIONS:  The patient is a 60 year old white male, admitted 05/29/01, through the emergency department to the cardiology service with epigastric abdominal pain and chest pain.  After work-up was completed, it became evident that the patient had acute cholecystitis and cholelithiasis and that his cardiac evaluation was unrevealing.  The patient was treated with intravenous antibiotics and prepared for the operating room.  He now comes to surgery for cholecystectomy for acute cholecystitis and cholelithiasis.  DESCRIPTION OF PROCEDURE:  The procedure was [facility] in OR #[facility].  The patient was brought to the operating room and placed in a supine position on the operating room table.  Following administration of general anesthesia, the patient was prepped and draped in the usual strict aseptic fashion.  After ascertaining that an adequate level of anesthesia had been obtained, an infraumbilical incision was made with a #15 blade. Dissection was carried through the subcutaneous tissues.  The fascia was incised in the midline.  The peritoneal cavity was entered cautiously.  An 0 Vicryl pursestring suture  was placed in the fascia.  An Hasson cannula is introduced under direct vision and secured with the pursestring suture.  The abdomen was insufflated with carbon dioxide.  Laparoscope was introduced under direct vision and the abdomen explored.  Omentum was tightly adherent to the liver consistent with a large inflammatory process.  Operative ports are placed along the right costal margin in the midline, mid clavicular line, and anterior axillary line.  Using blunt dissection, the omentum was mobilized off of the fundus of the gallbladder.  The gallbladder was quite thick-walled, rigid, and partially intrahepatic.  Using an aspirating trocar, the contents of the gallbladder are evacuated.  They are relatively clear, greenish fluid consistent with milk bile and a thick, greenish mucoid material.  The gallbladder was then grasped and retracted cephalad.  Omentum was carefully bluntly dissected off of the undersurface of the liver and the gallbladder. Dissection was carried down to the neck of the gallbladder.  After a relatively difficult dissection, the neck of the gallbladder was exposed. Structures were carefully dissected out right along the gallbladder wall and doubly clipped with Ligaclips and divided.  The cystic duct was finally positively identified and dissected out for an adequate length.  A clip was placed at the neck of the gallbladder.  The cystic duct was incised, and a Cook cholangiography catheter was brought through the right upper quadrant abdominal wall and inserted into the cystic duct and secured with a Ligaclip. Using C-arm fluoroscopy, real-time cholangiography was performed. Fortunately, there was a relatively long cystic duct.  It connected to a normal caliber common bile duct.  There was free fill of contrast into the large ductal systems.  There was free  flow distally through the common bile duct to the duodenum without filling defect or obstruction.   Cholangiography catheter was withdrawn after removing the securing clip.  The cystic duct was then triply clipped and divided.  Gallbladder was then excised from the gallbladder bed with some difficulty using hook and spatula electrocautery for hemostasis. The gallbladder was completely excised and placed into an EndoCatch bag.  Right upper quadrant was copiously irrigated with warm saline which was evacuated.  Good hemostasis was obtained.  A #19 Blake drain was brought into the abdomen through the lateral stab wound and placed in the gallbladder bed.  It was secured to the skin with 3-0 nylon suture.  Ports were removed under direct vision.  The 0 Vicryl pursestring suture at the umbilicus was tied securely.  Pneumoperitoneum was released.  All port sites were anesthetized with local anesthetic.  All sites were closed with interrupted 4-0 Vicryl subcuticular sutures.  Wounds were washed and dried, and Benzoin and Steri-Strips were applied.  Sterile gauze dressings were applied.  The patient was awakened from anesthesia and brought to the recovery room in stable condition.  The patient tolerated the procedure well. Attending Physician:  Bonnetta Barry DD:  05/31/01 TD:  06/03/01 Job: [id number]

## 2010-12-23 NOTE — H&P (Signed)
[facility]  Patient:    Allen Schroeder, Allen Schroeder Visit Number: [id number] MRN: [id number]          Service Type: MED Location: 3W [phone] Attending Physician:  Mora Appl Dictated by:   Meade Maw, M.D. Admit Date:  05/29/2001                           History and Physical  REASON FOR REFERRAL:  Epigastric/chest pain.  HISTORY OF PRESENT ILLNESS:  Allen Schroeder is a 60 year old pleasant male who presents to the emergency room with complaints of epigastric pain since 2 a.m. this morning.  The pain is described as a constant dull pain which has been intermittent.  There have been no aggravating factors, no alleviating factors. Associated symptoms include diaphoresis and nausea.  He was noted to have emesis at approximately 10 a.m.  Mr. Maffei attributes the pain to [place] food that he had eaten the night previously.  He has taken Mylanta with minimal relief.  He was treated in the emergency room with aspirin, sublingual nitroglycerin, and GI cocktail with minimal relief.  Coronary risk factors include male sex and family history.  Father had a myocardial infarction and was initially diagnosed in his 35s.  His total cholesterol is 240.  He is a nonsmoker.  PAST MEDICAL HISTORY:  None.  PAST SURGICAL HISTORY:  Vasectomy.  CURRENT MEDICATIONS:  Aspirin daily.  ALLERGIES:  None.  SOCIAL HISTORY:  He has been married for 27 years.  He has three children.  He works as a Engineer, building services.  He states he exercises frequently, 45 minutes every other day, including biking, running, and weight lifting.  There is no exacerbation of chest pain with exercise.  He notes 2-3 alcoholic beverages per day.  FAMILY HISTORY:  Mother passed at age 60 from aortic aneurysm.  Father is in good health, status post angioplasty.  He has two brothers who are in good health.  PHYSICAL EXAMINATION:  GENERAL:  Reveals a middle-aged male in moderate distress.  He is  diaphoretic and somewhat restless on the bed.  VITAL SIGNS:  Blood pressure is 135/90, heart rate is 80, O2 saturation is 96%.  HEENT:  Unremarkable.  NECK:  Good carotid upstrokes, no carotid bruit.  Thyroid is not palpable.  LUNGS:  Pulmonary exam reveals breath sounds which are equal and clear to auscultation.  No use of accessory muscles.  CARDIOVASCULAR:  Reveals a normal S1, normal S2, regular rate and rhythm.  No rubs, murmurs, or gallops as noted.  ABDOMEN:  Soft.  He has tenderness over the right upper quadrant as well as the midepigastric region.  EXTREMITIES:  Reveal no peripheral edema.  Distal pulses are equal and palpable.  LABORATORY DATA:  His white count is 10, hematocrit is 43, platelet count is 274.  Sodium is 138, potassium is 4.0, creatinine is 0.9.  He is noted to have normal liver enzymes.  Chest x-ray reveals no acute disease.  ECG reveals a sinus rhythm with normal R-wave progression, normal ECG.  Total cholesterol is 122 with a CK of 2.2.  IMPRESSION:  Ongoing epigastric discomfort.  Cardiac etiology is unlikely in that his CK is normal following more than eight hours of this discomfort.  He has a normal EKG.  In view of his alcoholic intake, we will check amylase, lipase, and a CT of the abdomen.  He will be admitted for observation.  If his serial cardiac  enzymes remain negative, we will anticipate a stress Cardiolite in the a.m. if his pain has resolved. Dictated by:   Meade Maw, M.D. Attending Physician:  Meade Maw A DD:  05/29/01 TD:  05/30/01 Job: [id number]

## 2010-12-23 NOTE — Discharge Summary (Signed)
NAMEKHALEEF, RUBY                ACCOUNT NO.:  [id number]   MEDICAL RECORD NO.:  [id number]          PATIENT TYPE:  INP   LOCATION:  3023                         FACILITY:  [facility]   PHYSICIAN:  Hettie Holstein, D.O.    DATE OF BIRTH:  [birth year]   DATE OF ADMISSION:  12/02/2007  DATE OF DISCHARGE:  12/05/2007                               DISCHARGE SUMMARY   PRIMARY CARE PHYSICIAN:  Unassigned.   PRIMARY GASTROENTEROLOGIST:  Vania Rea. Jarold Motto, MD, Caleen Essex, FAGA   FINAL DIAGNOSIS:  Alcohol intoxication.   SECONDARY DIAGNOSES:  1. Alcohol withdrawal.  2. Hypertension.  3. Mild elevation in transaminases.  4. Anxiety.  5. Alcohol dependency.   MEDICATIONS ON DISCHARGE:  He was discharged and instructed to continue  his  1. Diovan 80 mg daily.  2. Sertraline 100 mg one-half tablet daily as before.  3. Folic acid daily as before.  4. Ativan 1 mg twice daily only if needed, dispensed #6.  5. Toprol XL 25 mg daily.   DISPOSITION:  Mr. Heal was discharged in the care of his family who  intended to take him to an alcohol rehabilitation treatment program.   HISTORY OF PRESENT ILLNESS:  For full details, please refer to the H and  P as dictated by Dr. Flonnie Overman.  However, briefly, Mr. Mcgarvey is a 60-year-  old male who drinks approximately half a gallon of vodka every day.  He  was brought by his wife and brother from [place] because he was  getting progressively worse, more dysfunctional, and unable to ambulate.  He has had problems with speech, performing his activities of daily  living.  He has been seeing a gastroenterologist Dr. Jarold Motto with  elevated LFTs that was presumably  related to alcohol.  In any event, he  was acutely intoxicated at the time of admission.   HOSPITAL COURSE:  Mr. Fluegel was placed on alcohol withdrawal protocol,  and his thiamine and folate was replaced.  He underwent psychiatric  evaluation.  He had some elevations in his blood pressure  presumably  related to underlying essential hypertension as well as alcohol  withdrawal symptoms.  In any event, he was seen in  consultation by Dr. Electa Sniff of Psychiatry as well as his  gastroenterologist Dr. Jarold Motto.  He has had refractory relapses in the  past.  In any event, arrangements were made and discussed with family  for an alcohol treatment program, and he was discharged with his wife,  his brother, and nephew.      Hettie Holstein, D.O.  Electronically Signed     ESS/MEDQ  D:  12/24/2007  T:  12/25/2007  Job:  [id number]

## 2010-12-23 NOTE — Discharge Summary (Signed)
[facility]  Patient:    Allen Schroeder, Allen Schroeder Visit Number: [id number] MRN: [id number]          Service Type: MED Location: [phone] Attending Physician:  Bonnetta Barry Dictated by:   [name], M.D. Admit Date:  05/29/2001 Discharge Date: 06/03/2001   CC:         Meade Maw, M.D.                           Discharge Summary  REASON FOR ADMISSION:  Epigastric abdominal pain, chest pain.  HISTORY OF PRESENT ILLNESS:  The patient is a pleasant 60 year old male who presented to the emergency department with epigastric abdominal pain of approximately 12 hours duration.  This was associated with nausea and vomiting.  The patient was initially seen by cardiology and ruled out for myocardial ischemia.  General surgery was consulted.  The patient was seen late on the evening of May 29, 2001.  Abdominal CT scan had demonstrated gallbladder wall thickening with pericholecystic fluid.  Ultrasound of the abdomen demonstrated multiple gallstones, pericholecystic fluid, and wall thickening.  White blood cell count was 10.9 with 90% segmented neutrophils. Diagnosis of acute cholecystitis and cholelithiasis was made.  The patient was admitted to the hospital and started on intravenous antibiotics.  HOSPITAL COURSE:  The patient was admitted on May 29, 2001, and started on intravenous antibiotics.  He was given PCA morphine for pain control. Cardiology cleared the patient from a cardiac standpoint for surgery.  The patient was prepared and taken to the operating room on May 31, 2001.  He underwent laparoscopic cholecystectomy with intraoperative cholangiography. Findings at surgery included an acutely inflamed gallbladder containing multiple gallstones.  Postoperatively, the patient did well.  He started on a clear liquid diet.  He was continued on intravenous antibiotics for 72 hours. He was advanced to a regular diet.  The patient was  prepared for discharge home on the third postoperative day.  CONDITION ON DISCHARGE:  The patient is discharged home today on June 03, 2001, in good condition, tolerating a regular diet, and ambulating independently.  Jackson-Pratt drain has been removed from the right upper quadrant of the abdomen.  DISCHARGE MEDICATIONS: 1. Augmentin 875 mg b.i.d. x 5 days. 2. Vicodin p.r.n. pain.  FOLLOWUP:  The patient will be seen back in my office at [facility] in two weeks.  FINAL DIAGNOSES: 1. Acute cholecystitis. 2. Cholelithiasis.  CONDITION ON DISCHARGE:  Improved.Dictated by:   [name], M.D.  Attending Physician:  Bonnetta Barry DD:  06/03/01 TD:  06/04/01 Job: [id number]

## 2010-12-26 ENCOUNTER — Encounter (HOSPITAL_COMMUNITY): Payer: Self-pay

## 2010-12-28 ENCOUNTER — Other Ambulatory Visit: Payer: Self-pay | Admitting: Gastroenterology

## 2010-12-28 ENCOUNTER — Encounter (HOSPITAL_COMMUNITY): Payer: Self-pay

## 2010-12-30 ENCOUNTER — Encounter (HOSPITAL_COMMUNITY): Payer: Self-pay

## 2011-01-02 ENCOUNTER — Encounter (HOSPITAL_COMMUNITY): Payer: Self-pay

## 2011-01-03 ENCOUNTER — Encounter: Payer: Self-pay | Admitting: Cardiology

## 2011-01-04 ENCOUNTER — Encounter (HOSPITAL_COMMUNITY): Payer: Self-pay

## 2011-01-06 ENCOUNTER — Encounter (HOSPITAL_COMMUNITY): Payer: Self-pay | Attending: Cardiology

## 2011-01-06 DIAGNOSIS — C801 Malignant (primary) neoplasm, unspecified: Secondary | ICD-10-CM | POA: Insufficient documentation

## 2011-01-06 DIAGNOSIS — C7982 Secondary malignant neoplasm of genital organs: Secondary | ICD-10-CM | POA: Insufficient documentation

## 2011-01-06 DIAGNOSIS — I200 Unstable angina: Secondary | ICD-10-CM | POA: Insufficient documentation

## 2011-01-06 DIAGNOSIS — F1010 Alcohol abuse, uncomplicated: Secondary | ICD-10-CM | POA: Insufficient documentation

## 2011-01-06 DIAGNOSIS — I4891 Unspecified atrial fibrillation: Secondary | ICD-10-CM | POA: Insufficient documentation

## 2011-01-06 DIAGNOSIS — Z7982 Long term (current) use of aspirin: Secondary | ICD-10-CM | POA: Insufficient documentation

## 2011-01-06 DIAGNOSIS — Z9861 Coronary angioplasty status: Secondary | ICD-10-CM | POA: Insufficient documentation

## 2011-01-06 DIAGNOSIS — Z5189 Encounter for other specified aftercare: Secondary | ICD-10-CM | POA: Insufficient documentation

## 2011-01-06 DIAGNOSIS — Z7902 Long term (current) use of antithrombotics/antiplatelets: Secondary | ICD-10-CM | POA: Insufficient documentation

## 2011-01-06 DIAGNOSIS — I10 Essential (primary) hypertension: Secondary | ICD-10-CM | POA: Insufficient documentation

## 2011-01-09 ENCOUNTER — Encounter (HOSPITAL_COMMUNITY): Payer: Self-pay

## 2011-01-11 ENCOUNTER — Encounter (HOSPITAL_COMMUNITY): Payer: Self-pay

## 2011-01-13 ENCOUNTER — Encounter (HOSPITAL_COMMUNITY): Payer: Self-pay

## 2011-01-13 ENCOUNTER — Encounter: Payer: Self-pay | Admitting: Cardiology

## 2011-01-13 ENCOUNTER — Ambulatory Visit (INDEPENDENT_AMBULATORY_CARE_PROVIDER_SITE_OTHER): Payer: 59 | Admitting: Cardiology

## 2011-01-13 VITALS — BP 108/76 | HR 70 | Ht 74.0 in | Wt 222.0 lb

## 2011-01-13 DIAGNOSIS — I10 Essential (primary) hypertension: Secondary | ICD-10-CM

## 2011-01-13 DIAGNOSIS — E785 Hyperlipidemia, unspecified: Secondary | ICD-10-CM

## 2011-01-13 DIAGNOSIS — I4891 Unspecified atrial fibrillation: Secondary | ICD-10-CM

## 2011-01-13 DIAGNOSIS — I2510 Atherosclerotic heart disease of native coronary artery without angina pectoris: Secondary | ICD-10-CM

## 2011-01-13 MED ORDER — ASPIRIN EC 81 MG PO TBEC: 81.0000 mg | DELAYED_RELEASE_TABLET | Freq: Every day | ORAL | Status: AC

## 2011-01-13 NOTE — Progress Notes (Signed)
HPI Allen Schroeder returns today for followup and management of his coronary artery disease, paroxysmal atrial fibrillation, hypertension, hyperlipidemia.  He is having no angina or ischemic symptoms. He has never been symptomatic with his  Atrial fibrillation. He denies any bleeding.  He remains on 325 mg of aspirin. We'll decrease that today to 81 mg. Continue with Plavix.  His time for blood work which we will arrange.  Electrocardiogram shows normal sinus rhythm, normal EKG. Past Medical History  Diagnosis Date  . Atrial fibrillation   . Other and unspecified hyperlipidemia   . Unspecified essential hypertension   . Malignant neoplasm of prostate 11/2007    Mets to Bone  . Elevated prostate specific antigen (PSA)   . Personal history of colonic polyps   . Hypocalcemia   . Acute alcoholic hepatitis   . Essential and other specified forms of tremor   . Anxiety state, unspecified   . Alcohol abuse, unspecified   . Family history of malignant neoplasm of gastrointestinal tract   . Alcoholic hepatitis     Past Surgical History  Procedure Date  . Vasectomy   . Cholecystectomy   . Coronary stent placement 2011    Family History  Problem Relation Age of Onset  . Prostate cancer Father   . Coronary artery disease Father   . Colon cancer Father     History   Social History  . Marital Status: Married    Spouse Name: N/A    Number of Children: 3  . 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 Smoking status: Former Smoker    Types: Cigarettes  . Smokeless tobacco: Never Used   Comment: stopped in college  . Alcohol Use: Yes     has consumed as much as a 1/2 gallon of vodka in a day, now drinks at least 3-5 drinks per day.   . Drug Use: No  . Sexually Active: Not on file   Other Topics Concern  . Not on file   Social History Narrative  . No narrative on file    Allergies  Allergen  Reactions  . Nucynta (Tapentadol Hydrochloride) Hives    Current Outpatient Prescriptions  Medication Sig Dispense Refill  . CALCIUM CARB-CHOLECALCIFEROL PO Take 1 capsule by mouth daily.        . celecoxib (CELEBREX) 200 MG capsule Take 1 capsule (200 mg total) by mouth 2 (two) times daily.  60 capsule  3  . clopidogrel (PLAVIX) 75 MG tablet Take 75 mg by mouth daily.        . diphenhydrAMINE (BENADRYL) 25 MG tablet Take 25 mg by mouth every 6 (six) hours as needed.        . folic acid (FOLVITE) 1 MG tablet TAKE ONE TABLET EACH DAY  30 tablet  6  . metoprolol succinate (TOPROL-XL) 25 MG 24 hr tablet Take 25 mg by mouth daily.        . Multiple Vitamin (MULTIVITAMIN) tablet Take 1 tablet by mouth daily.        . nitroGLYCERIN (NITROSTAT) 0.4 MG SL tablet Place 0.4 mg under the tongue every 5 (five) minutes as needed.        [name] omeprazole (PRILOSEC) 40 MG capsule Take 1 capsule (40 mg total) by mouth daily.  30 capsule  11  . rosuvastatin (CRESTOR) 20 MG tablet Take 20 mg by mouth daily.        [name]  sotalol (BETAPACE) 120 MG tablet Take 120 mg by mouth 2 (two) times daily.        [name] zolendronic acid (ZOMETA) 4 MG/5ML injection Inject 4 mg into the vein every 30 (thirty) days.        [name] DISCONTD: aspirin 325 MG tablet Take 325 mg by mouth daily.        [name] aspirin EC 81 MG tablet Take 1 tablet (81 mg total) by mouth daily.  150 tablet  2    ROS Negative other than HPI.   PE General Appearance: well developed, well nourished in no acute distress, overweight, pale HEENT: symmetrical face, PERRLA, good dentition  Neck: no JVD, thyromegaly, or adenopathy, trachea midline Chest: symmetric without deformity Cardiac: PMI non-displaced, RRR, normal S1, S2, no gallop or murmur Lung: clear to ausculation and percussion Vascular: all pulses full without bruits  Abdominal: Distended nontender, good bowel sounds, no HSM, no bruits Extremities: no cyanosis, clubbing or edema, no sign of DVT, no  varicosities  Skin: normal color, no rashes Neuro: alert and oriented x 3, non-focal Pysch: normal affect Filed Vitals:   01/13/11 1619  BP: 108/76  Pulse: 70  Height: 6\' 2"  (1.88 m)  Weight: 222 lb (100.699 kg)    EKG  Labs and Studies Reviewed.   Lab Results  Component Value Date   WBC 5.1 10/08/2010   HGB 12.8* 10/08/2010   HCT 37.8* 10/08/2010   MCV 99.7 10/08/2010   PLT 159 10/08/2010      Chemistry      Component Value Date/Time   NA 138 10/08/2010 0059   K 3.4* 10/08/2010 0059   CL 107 10/08/2010 0059   CO2 20 10/08/2010 0059   BUN 15 10/08/2010 0059   CREATININE 0.73 10/08/2010 0059      Component Value Date/Time   CALCIUM 8.7 10/08/2010 0059   ALKPHOS 58 10/08/2010 0059   AST 79* 10/08/2010 0059   ALT 37 10/08/2010 0059   BILITOT 0.7 10/08/2010 0059       Lab Results  Component Value Date   CHOL 200 05/11/2010   CHOL 153 01/19/2010   CHOL  Value: 318        ATP III CLASSIFICATION:  <200     mg/dL   Desirable  161-096  mg/dL   Borderline High  >=045    mg/dL   High       * 4/0/9811   Lab Results  Component Value Date   HDL 61.30 05/11/2010   HDL 91.47 01/19/2010   HDL 57 01/12/2009   Lab Results  Component Value Date   LDLCALC 83 01/19/2010   LDLCALC  Value: 198        Total Cholesterol/HDL:CHD Risk Coronary Heart Disease Risk Table                     Men   Women  1/2 Average Risk   3.4   3.3  Average Risk       5.0   4.4  2 X Average Risk   9.6   7.1  3 X Average Risk  23.4   11.0        Use the calculated Patient Ratio above and the CHD Risk Table to determine the patient's CHD Risk.        ATP III CLASSIFICATION (LDL):  <100     mg/dL   Optimal  829-562  mg/dL   Near or Above  Optimal  130-159  mg/dL   Borderline  161-096  mg/dL   High  >045     mg/dL   Very High* 4/0/9811   LDLCALC 70 07/10/2008   Lab Results  Component Value Date   TRIG 327.0* 05/11/2010   TRIG 108.0 01/19/2010   TRIG 313* 01/12/2009   Lab Results  Component Value Date   CHOLHDL 3 05/11/2010     CHOLHDL 3 01/19/2010   CHOLHDL 5.6 01/12/2009   No results found for this basename: HGBA1C   Lab Results  Component Value Date   ALT 37 10/08/2010   AST 79* 10/08/2010   ALKPHOS 58 10/08/2010   BILITOT 0.7 10/08/2010   Lab Results  Component Value Date   TSH 1.802 03/18/2010

## 2011-01-13 NOTE — Assessment & Plan Note (Signed)
Stable. We'll decrease his aspirin to 81 mg a day to decrease his risk of bleeding.

## 2011-01-13 NOTE — Assessment & Plan Note (Signed)
Good control

## 2011-01-13 NOTE — Assessment & Plan Note (Signed)
Arrange fasting blood work.

## 2011-01-13 NOTE — Assessment & Plan Note (Signed)
Stable. No change in treatment. 

## 2011-01-13 NOTE — Patient Instructions (Signed)
Your physician recommends that you schedule a follow-up appointment in:  6 months with Dr. Daleen Squibb Your physician has recommended you make the following change in your medication:  Decrease aspirin to 81 mg daily

## 2011-01-16 ENCOUNTER — Encounter (HOSPITAL_COMMUNITY): Payer: Self-pay

## 2011-01-18 ENCOUNTER — Encounter (HOSPITAL_COMMUNITY): Payer: Self-pay

## 2011-01-20 ENCOUNTER — Encounter (HOSPITAL_COMMUNITY): Payer: Self-pay

## 2011-01-23 ENCOUNTER — Encounter (HOSPITAL_COMMUNITY): Payer: Self-pay

## 2011-01-25 ENCOUNTER — Encounter (HOSPITAL_COMMUNITY): Payer: Self-pay

## 2011-01-27 ENCOUNTER — Encounter (HOSPITAL_COMMUNITY): Payer: Self-pay

## 2011-01-30 ENCOUNTER — Encounter (HOSPITAL_COMMUNITY): Payer: Self-pay

## 2011-01-31 ENCOUNTER — Encounter (HOSPITAL_BASED_OUTPATIENT_CLINIC_OR_DEPARTMENT_OTHER): Payer: 59 | Admitting: Oncology

## 2011-01-31 ENCOUNTER — Other Ambulatory Visit: Payer: Self-pay | Admitting: Oncology

## 2011-01-31 DIAGNOSIS — C7951 Secondary malignant neoplasm of bone: Secondary | ICD-10-CM

## 2011-01-31 DIAGNOSIS — C7952 Secondary malignant neoplasm of bone marrow: Secondary | ICD-10-CM

## 2011-01-31 DIAGNOSIS — C61 Malignant neoplasm of prostate: Secondary | ICD-10-CM

## 2011-01-31 LAB — GAMMA GT: GGT: 170 U/L — ABNORMAL HIGH (ref 7–51)

## 2011-01-31 LAB — CBC WITH DIFFERENTIAL/PLATELET
Basophils Absolute: 0 10*3/uL (ref 0.0–0.1)
Eosinophils Absolute: 0.4 10*3/uL (ref 0.0–0.5)
HCT: 35.7 % — ABNORMAL LOW (ref 38.4–49.9)
HGB: 12.4 g/dL — ABNORMAL LOW (ref 13.0–17.1)
LYMPH%: 32.6 % (ref 14.0–49.0)
MCV: 101.4 fL — ABNORMAL HIGH (ref 79.3–98.0)
MONO%: 9.3 % (ref 0.0–14.0)
NEUT#: 3.4 10*3/uL (ref 1.5–6.5)
Platelets: 159 10*3/uL (ref 140–400)

## 2011-01-31 LAB — COMPREHENSIVE METABOLIC PANEL
Albumin: 4.4 g/dL (ref 3.5–5.2)
Alkaline Phosphatase: 55 U/L (ref 39–117)
BUN: 14 mg/dL (ref 6–23)
Glucose, Bld: 94 mg/dL (ref 70–99)
Potassium: 4.2 mEq/L (ref 3.5–5.3)

## 2011-01-31 LAB — PSA: PSA: 0.24 ng/mL (ref <=4.00)

## 2011-01-31 LAB — LACTATE DEHYDROGENASE: LDH: 191 U/L (ref 94–250)

## 2011-02-01 ENCOUNTER — Encounter (HOSPITAL_COMMUNITY): Payer: Self-pay

## 2011-02-03 ENCOUNTER — Encounter (HOSPITAL_COMMUNITY): Payer: Self-pay

## 2011-02-06 ENCOUNTER — Encounter (HOSPITAL_COMMUNITY): Payer: Self-pay | Attending: Cardiology

## 2011-02-06 DIAGNOSIS — I4891 Unspecified atrial fibrillation: Secondary | ICD-10-CM | POA: Insufficient documentation

## 2011-02-06 DIAGNOSIS — Z5189 Encounter for other specified aftercare: Secondary | ICD-10-CM | POA: Insufficient documentation

## 2011-02-06 DIAGNOSIS — F1010 Alcohol abuse, uncomplicated: Secondary | ICD-10-CM | POA: Insufficient documentation

## 2011-02-06 DIAGNOSIS — Z9861 Coronary angioplasty status: Secondary | ICD-10-CM | POA: Insufficient documentation

## 2011-02-06 DIAGNOSIS — Z7982 Long term (current) use of aspirin: Secondary | ICD-10-CM | POA: Insufficient documentation

## 2011-02-06 DIAGNOSIS — C7982 Secondary malignant neoplasm of genital organs: Secondary | ICD-10-CM | POA: Insufficient documentation

## 2011-02-06 DIAGNOSIS — I200 Unstable angina: Secondary | ICD-10-CM | POA: Insufficient documentation

## 2011-02-06 DIAGNOSIS — I10 Essential (primary) hypertension: Secondary | ICD-10-CM | POA: Insufficient documentation

## 2011-02-06 DIAGNOSIS — Z7902 Long term (current) use of antithrombotics/antiplatelets: Secondary | ICD-10-CM | POA: Insufficient documentation

## 2011-02-06 DIAGNOSIS — C801 Malignant (primary) neoplasm, unspecified: Secondary | ICD-10-CM | POA: Insufficient documentation

## 2011-02-08 ENCOUNTER — Encounter (HOSPITAL_COMMUNITY): Payer: Self-pay

## 2011-02-10 ENCOUNTER — Encounter (HOSPITAL_COMMUNITY): Payer: Self-pay

## 2011-02-13 ENCOUNTER — Encounter (HOSPITAL_COMMUNITY): Payer: Self-pay

## 2011-02-14 ENCOUNTER — Encounter: Payer: 59 | Admitting: Oncology

## 2011-02-15 ENCOUNTER — Encounter (HOSPITAL_COMMUNITY): Payer: Self-pay

## 2011-02-17 ENCOUNTER — Encounter (HOSPITAL_COMMUNITY): Payer: Self-pay

## 2011-02-20 ENCOUNTER — Encounter (HOSPITAL_COMMUNITY): Payer: Self-pay

## 2011-02-22 ENCOUNTER — Encounter (HOSPITAL_COMMUNITY): Payer: Self-pay

## 2011-02-24 ENCOUNTER — Encounter (HOSPITAL_COMMUNITY): Payer: Self-pay

## 2011-02-27 ENCOUNTER — Encounter (HOSPITAL_COMMUNITY): Payer: Self-pay

## 2011-03-01 ENCOUNTER — Encounter (HOSPITAL_COMMUNITY): Payer: Self-pay

## 2011-03-03 ENCOUNTER — Encounter (HOSPITAL_COMMUNITY): Payer: Self-pay

## 2011-03-04 ENCOUNTER — Other Ambulatory Visit: Payer: Self-pay | Admitting: Cardiology

## 2011-03-06 ENCOUNTER — Encounter (HOSPITAL_COMMUNITY): Payer: Self-pay

## 2011-03-08 ENCOUNTER — Encounter (HOSPITAL_COMMUNITY): Payer: Self-pay | Attending: Cardiology

## 2011-03-08 DIAGNOSIS — I200 Unstable angina: Secondary | ICD-10-CM | POA: Insufficient documentation

## 2011-03-08 DIAGNOSIS — Z7982 Long term (current) use of aspirin: Secondary | ICD-10-CM | POA: Insufficient documentation

## 2011-03-08 DIAGNOSIS — F1010 Alcohol abuse, uncomplicated: Secondary | ICD-10-CM | POA: Insufficient documentation

## 2011-03-08 DIAGNOSIS — C7982 Secondary malignant neoplasm of genital organs: Secondary | ICD-10-CM | POA: Insufficient documentation

## 2011-03-08 DIAGNOSIS — I4891 Unspecified atrial fibrillation: Secondary | ICD-10-CM | POA: Insufficient documentation

## 2011-03-08 DIAGNOSIS — Z5189 Encounter for other specified aftercare: Secondary | ICD-10-CM | POA: Insufficient documentation

## 2011-03-08 DIAGNOSIS — C801 Malignant (primary) neoplasm, unspecified: Secondary | ICD-10-CM | POA: Insufficient documentation

## 2011-03-08 DIAGNOSIS — Z7902 Long term (current) use of antithrombotics/antiplatelets: Secondary | ICD-10-CM | POA: Insufficient documentation

## 2011-03-08 DIAGNOSIS — I10 Essential (primary) hypertension: Secondary | ICD-10-CM | POA: Insufficient documentation

## 2011-03-08 DIAGNOSIS — Z9861 Coronary angioplasty status: Secondary | ICD-10-CM | POA: Insufficient documentation

## 2011-03-10 ENCOUNTER — Encounter (HOSPITAL_COMMUNITY): Payer: Self-pay

## 2011-03-13 ENCOUNTER — Encounter (HOSPITAL_COMMUNITY): Payer: Self-pay

## 2011-03-15 ENCOUNTER — Encounter (HOSPITAL_COMMUNITY): Payer: Self-pay

## 2011-03-17 ENCOUNTER — Encounter (HOSPITAL_COMMUNITY): Payer: Self-pay

## 2011-03-20 ENCOUNTER — Encounter (HOSPITAL_COMMUNITY): Payer: Self-pay

## 2011-03-22 ENCOUNTER — Encounter (HOSPITAL_COMMUNITY): Payer: Self-pay

## 2011-03-23 ENCOUNTER — Telehealth: Payer: Self-pay | Admitting: *Deleted

## 2011-03-23 NOTE — Telephone Encounter (Signed)
Left message with Sabino Snipes, RN who is Dr Lucien Mons nurse. Dr Jarold Motto would like advise on pain management; he has tried him on Nucynta and Celebrex, but pt c/o itching with both. ( Celebrex remains on his med list). Pt has Prostate Ca with mets and is having increased back and leg pain. Pt is still using alcohol and has Alcoholic Hepatitis. Dr Jarold Motto will order the med Dr Cyndie Chime recommends. Dr Cyndie Chime called, but will call back tomorrow when Dr Jarold Motto is here.

## 2011-03-24 ENCOUNTER — Encounter (HOSPITAL_COMMUNITY): Payer: Self-pay

## 2011-03-27 ENCOUNTER — Encounter (HOSPITAL_COMMUNITY): Payer: Self-pay

## 2011-03-29 ENCOUNTER — Encounter (HOSPITAL_COMMUNITY): Payer: Self-pay

## 2011-03-31 ENCOUNTER — Encounter (HOSPITAL_COMMUNITY): Payer: Self-pay

## 2011-04-03 ENCOUNTER — Encounter (HOSPITAL_COMMUNITY): Payer: Self-pay

## 2011-04-05 ENCOUNTER — Encounter (HOSPITAL_COMMUNITY): Payer: Self-pay

## 2011-04-07 ENCOUNTER — Encounter (HOSPITAL_COMMUNITY): Payer: Self-pay

## 2011-04-10 ENCOUNTER — Encounter (HOSPITAL_COMMUNITY): Payer: Self-pay | Attending: Cardiology

## 2011-04-10 DIAGNOSIS — Z7982 Long term (current) use of aspirin: Secondary | ICD-10-CM | POA: Insufficient documentation

## 2011-04-10 DIAGNOSIS — C7982 Secondary malignant neoplasm of genital organs: Secondary | ICD-10-CM | POA: Insufficient documentation

## 2011-04-10 DIAGNOSIS — I10 Essential (primary) hypertension: Secondary | ICD-10-CM | POA: Insufficient documentation

## 2011-04-10 DIAGNOSIS — Z7902 Long term (current) use of antithrombotics/antiplatelets: Secondary | ICD-10-CM | POA: Insufficient documentation

## 2011-04-10 DIAGNOSIS — F1010 Alcohol abuse, uncomplicated: Secondary | ICD-10-CM | POA: Insufficient documentation

## 2011-04-10 DIAGNOSIS — Z9861 Coronary angioplasty status: Secondary | ICD-10-CM | POA: Insufficient documentation

## 2011-04-10 DIAGNOSIS — C801 Malignant (primary) neoplasm, unspecified: Secondary | ICD-10-CM | POA: Insufficient documentation

## 2011-04-10 DIAGNOSIS — I4891 Unspecified atrial fibrillation: Secondary | ICD-10-CM | POA: Insufficient documentation

## 2011-04-10 DIAGNOSIS — I200 Unstable angina: Secondary | ICD-10-CM | POA: Insufficient documentation

## 2011-04-10 DIAGNOSIS — Z5189 Encounter for other specified aftercare: Secondary | ICD-10-CM | POA: Insufficient documentation

## 2011-04-12 ENCOUNTER — Encounter (HOSPITAL_COMMUNITY): Payer: Self-pay

## 2011-04-13 ENCOUNTER — Other Ambulatory Visit: Payer: Self-pay | Admitting: *Deleted

## 2011-04-13 MED ORDER — CLOPIDOGREL BISULFATE 75 MG PO TABS: 75.0000 mg | ORAL_TABLET | Freq: Every day | ORAL | Status: DC

## 2011-04-14 ENCOUNTER — Encounter (HOSPITAL_COMMUNITY): Payer: Self-pay

## 2011-04-17 ENCOUNTER — Encounter (HOSPITAL_COMMUNITY): Payer: Self-pay

## 2011-04-19 ENCOUNTER — Encounter (HOSPITAL_COMMUNITY): Payer: Self-pay

## 2011-04-19 ENCOUNTER — Other Ambulatory Visit: Payer: Self-pay | Admitting: *Deleted

## 2011-04-19 MED ORDER — METOPROLOL SUCCINATE ER 25 MG PO TB24: 25.0000 mg | ORAL_TABLET | Freq: Every day | ORAL | Status: DC

## 2011-04-21 ENCOUNTER — Encounter (HOSPITAL_COMMUNITY): Payer: Self-pay

## 2011-04-24 ENCOUNTER — Encounter (HOSPITAL_COMMUNITY): Payer: Self-pay

## 2011-04-26 ENCOUNTER — Encounter (HOSPITAL_COMMUNITY): Payer: Self-pay

## 2011-04-28 ENCOUNTER — Encounter (HOSPITAL_COMMUNITY): Payer: Self-pay

## 2011-05-01 ENCOUNTER — Encounter (HOSPITAL_COMMUNITY): Payer: Self-pay

## 2011-05-02 LAB — RAPID URINE DRUG SCREEN, HOSP PERFORMED
Amphetamines: NOT DETECTED
Barbiturates: NOT DETECTED
Benzodiazepines: NOT DETECTED
Benzodiazepines: NOT DETECTED
Opiates: NOT DETECTED
Tetrahydrocannabinol: NOT DETECTED
Tetrahydrocannabinol: NOT DETECTED

## 2011-05-02 LAB — POCT CARDIAC MARKERS
CKMB, poc: 3
Myoglobin, poc: 99.5
Operator id: 151321
Troponin i, poc: <0.05

## 2011-05-02 LAB — POCT I-STAT, CHEM 8
BUN: 12
Chloride: 111
Potassium: 3.5
Sodium: 144

## 2011-05-02 LAB — COMPREHENSIVE METABOLIC PANEL
ALT: 32
ALT: 33
AST: 48 — ABNORMAL HIGH
AST: 56 — ABNORMAL HIGH
AST: 60 — ABNORMAL HIGH
Albumin: 3.3 — ABNORMAL LOW
Albumin: 3.4 — ABNORMAL LOW
Alkaline Phosphatase: 74
CO2: 22
CO2: 23
Calcium: 8.6
Chloride: 109
Chloride: 110
Chloride: 113 — ABNORMAL HIGH
Creatinine, Ser: 0.89
GFR calc Af Amer: >60
GFR calc Af Amer: >60
GFR calc Af Amer: >60
GFR calc non Af Amer: >60
GFR calc non Af Amer: >60
Glucose, Bld: 83
Potassium: 3.8
Sodium: 141
Sodium: 142
Total Bilirubin: 0.4
Total Bilirubin: 0.4

## 2011-05-02 LAB — AMMONIA: Ammonia: 43 — ABNORMAL HIGH

## 2011-05-02 LAB — HEPATITIS PANEL, ACUTE: Hep B C IgM: NEGATIVE

## 2011-05-02 LAB — URINALYSIS, ROUTINE W REFLEX MICROSCOPIC
Bilirubin Urine: NEGATIVE
Ketones, ur: NEGATIVE
Leukocytes, UA: NEGATIVE
Nitrite: NEGATIVE
Protein, ur: NEGATIVE

## 2011-05-02 LAB — CBC
Hemoglobin: 14.2
MCV: 97.5
RBC: 4.16 — ABNORMAL LOW
WBC: 7.6

## 2011-05-02 LAB — DIFFERENTIAL
Lymphocytes Relative: 34
Lymphs Abs: 2.6
Monocytes Absolute: 0.6
Monocytes Relative: 8
Neutro Abs: 4.1

## 2011-05-03 ENCOUNTER — Encounter (HOSPITAL_COMMUNITY): Payer: Self-pay

## 2011-05-05 ENCOUNTER — Encounter (HOSPITAL_COMMUNITY): Payer: Self-pay

## 2011-05-05 ENCOUNTER — Other Ambulatory Visit: Payer: Self-pay | Admitting: Cardiology

## 2011-05-08 ENCOUNTER — Encounter (HOSPITAL_COMMUNITY): Payer: Self-pay | Attending: Cardiology

## 2011-05-08 DIAGNOSIS — Z5189 Encounter for other specified aftercare: Secondary | ICD-10-CM | POA: Insufficient documentation

## 2011-05-08 DIAGNOSIS — I4891 Unspecified atrial fibrillation: Secondary | ICD-10-CM | POA: Insufficient documentation

## 2011-05-08 DIAGNOSIS — C801 Malignant (primary) neoplasm, unspecified: Secondary | ICD-10-CM | POA: Insufficient documentation

## 2011-05-08 DIAGNOSIS — F1010 Alcohol abuse, uncomplicated: Secondary | ICD-10-CM | POA: Insufficient documentation

## 2011-05-08 DIAGNOSIS — I10 Essential (primary) hypertension: Secondary | ICD-10-CM | POA: Insufficient documentation

## 2011-05-08 DIAGNOSIS — Z9861 Coronary angioplasty status: Secondary | ICD-10-CM | POA: Insufficient documentation

## 2011-05-08 DIAGNOSIS — Z7902 Long term (current) use of antithrombotics/antiplatelets: Secondary | ICD-10-CM | POA: Insufficient documentation

## 2011-05-08 DIAGNOSIS — I200 Unstable angina: Secondary | ICD-10-CM | POA: Insufficient documentation

## 2011-05-08 DIAGNOSIS — Z7982 Long term (current) use of aspirin: Secondary | ICD-10-CM | POA: Insufficient documentation

## 2011-05-08 DIAGNOSIS — C7982 Secondary malignant neoplasm of genital organs: Secondary | ICD-10-CM | POA: Insufficient documentation

## 2011-05-10 ENCOUNTER — Encounter (HOSPITAL_COMMUNITY): Payer: Self-pay

## 2011-05-12 ENCOUNTER — Encounter (HOSPITAL_COMMUNITY): Payer: Self-pay

## 2011-05-15 ENCOUNTER — Encounter (HOSPITAL_COMMUNITY): Payer: Self-pay

## 2011-05-17 ENCOUNTER — Encounter (HOSPITAL_COMMUNITY): Payer: Self-pay

## 2011-05-19 ENCOUNTER — Encounter (HOSPITAL_COMMUNITY): Payer: Self-pay

## 2011-05-22 ENCOUNTER — Encounter (HOSPITAL_COMMUNITY): Payer: Self-pay

## 2011-05-24 ENCOUNTER — Encounter (HOSPITAL_COMMUNITY): Payer: Self-pay

## 2011-05-26 ENCOUNTER — Encounter (HOSPITAL_COMMUNITY): Payer: Self-pay

## 2011-05-29 ENCOUNTER — Encounter (HOSPITAL_COMMUNITY): Payer: Self-pay

## 2011-05-31 ENCOUNTER — Encounter (HOSPITAL_COMMUNITY): Payer: Self-pay

## 2011-06-02 ENCOUNTER — Encounter (HOSPITAL_COMMUNITY): Payer: Self-pay

## 2011-06-05 ENCOUNTER — Encounter (HOSPITAL_COMMUNITY): Payer: Self-pay

## 2011-06-07 ENCOUNTER — Encounter (HOSPITAL_COMMUNITY): Payer: Self-pay

## 2011-06-09 ENCOUNTER — Encounter (HOSPITAL_COMMUNITY): Payer: Self-pay

## 2011-06-09 DIAGNOSIS — I10 Essential (primary) hypertension: Secondary | ICD-10-CM | POA: Insufficient documentation

## 2011-06-09 DIAGNOSIS — C801 Malignant (primary) neoplasm, unspecified: Secondary | ICD-10-CM | POA: Insufficient documentation

## 2011-06-09 DIAGNOSIS — I200 Unstable angina: Secondary | ICD-10-CM | POA: Insufficient documentation

## 2011-06-09 DIAGNOSIS — Z9861 Coronary angioplasty status: Secondary | ICD-10-CM | POA: Insufficient documentation

## 2011-06-09 DIAGNOSIS — Z7982 Long term (current) use of aspirin: Secondary | ICD-10-CM | POA: Insufficient documentation

## 2011-06-09 DIAGNOSIS — Z7902 Long term (current) use of antithrombotics/antiplatelets: Secondary | ICD-10-CM | POA: Insufficient documentation

## 2011-06-09 DIAGNOSIS — Z5189 Encounter for other specified aftercare: Secondary | ICD-10-CM | POA: Insufficient documentation

## 2011-06-09 DIAGNOSIS — I4891 Unspecified atrial fibrillation: Secondary | ICD-10-CM | POA: Insufficient documentation

## 2011-06-09 DIAGNOSIS — F1010 Alcohol abuse, uncomplicated: Secondary | ICD-10-CM | POA: Insufficient documentation

## 2011-06-09 DIAGNOSIS — C7982 Secondary malignant neoplasm of genital organs: Secondary | ICD-10-CM | POA: Insufficient documentation

## 2011-06-12 ENCOUNTER — Encounter (HOSPITAL_COMMUNITY): Payer: Self-pay

## 2011-06-14 ENCOUNTER — Encounter (HOSPITAL_COMMUNITY): Payer: Self-pay

## 2011-06-16 ENCOUNTER — Encounter (HOSPITAL_COMMUNITY): Payer: Self-pay

## 2011-06-19 ENCOUNTER — Encounter (HOSPITAL_COMMUNITY): Payer: Self-pay

## 2011-06-21 ENCOUNTER — Encounter (HOSPITAL_COMMUNITY): Payer: Self-pay

## 2011-06-23 ENCOUNTER — Encounter (HOSPITAL_COMMUNITY): Payer: Self-pay

## 2011-06-26 ENCOUNTER — Encounter (HOSPITAL_COMMUNITY)
Admission: RE | Admit: 2011-06-26 | Discharge: 2011-06-26 | Disposition: A | Discharge status: Home / Self Care | Payer: Self-pay | Source: Ambulatory Visit | Attending: Cardiology | Admitting: Cardiology

## 2011-06-28 ENCOUNTER — Encounter (HOSPITAL_COMMUNITY): Payer: Self-pay

## 2011-06-30 ENCOUNTER — Encounter (HOSPITAL_COMMUNITY): Payer: Self-pay

## 2011-07-03 ENCOUNTER — Encounter (HOSPITAL_COMMUNITY): Payer: Self-pay

## 2011-07-05 ENCOUNTER — Encounter (HOSPITAL_COMMUNITY)
Admission: RE | Admit: 2011-07-05 | Discharge: 2011-07-05 | Disposition: A | Discharge status: Home / Self Care | Payer: Self-pay | Source: Ambulatory Visit | Attending: Cardiology | Admitting: Cardiology

## 2011-07-07 ENCOUNTER — Encounter (HOSPITAL_COMMUNITY): Payer: Self-pay

## 2011-07-10 ENCOUNTER — Encounter (HOSPITAL_COMMUNITY)
Admission: RE | Admit: 2011-07-10 | Discharge: 2011-07-10 | Disposition: A | Discharge status: Home / Self Care | Payer: Self-pay | Source: Ambulatory Visit | Attending: Cardiology | Admitting: Cardiology

## 2011-07-10 DIAGNOSIS — Z7982 Long term (current) use of aspirin: Secondary | ICD-10-CM | POA: Insufficient documentation

## 2011-07-10 DIAGNOSIS — C7982 Secondary malignant neoplasm of genital organs: Secondary | ICD-10-CM | POA: Insufficient documentation

## 2011-07-10 DIAGNOSIS — Z9861 Coronary angioplasty status: Secondary | ICD-10-CM | POA: Insufficient documentation

## 2011-07-10 DIAGNOSIS — F1010 Alcohol abuse, uncomplicated: Secondary | ICD-10-CM | POA: Insufficient documentation

## 2011-07-10 DIAGNOSIS — I200 Unstable angina: Secondary | ICD-10-CM | POA: Insufficient documentation

## 2011-07-10 DIAGNOSIS — I10 Essential (primary) hypertension: Secondary | ICD-10-CM | POA: Insufficient documentation

## 2011-07-10 DIAGNOSIS — Z7902 Long term (current) use of antithrombotics/antiplatelets: Secondary | ICD-10-CM | POA: Insufficient documentation

## 2011-07-10 DIAGNOSIS — C801 Malignant (primary) neoplasm, unspecified: Secondary | ICD-10-CM | POA: Insufficient documentation

## 2011-07-10 DIAGNOSIS — Z5189 Encounter for other specified aftercare: Secondary | ICD-10-CM | POA: Insufficient documentation

## 2011-07-10 DIAGNOSIS — I4891 Unspecified atrial fibrillation: Secondary | ICD-10-CM | POA: Insufficient documentation

## 2011-07-12 ENCOUNTER — Encounter (HOSPITAL_COMMUNITY)
Admission: RE | Admit: 2011-07-12 | Discharge: 2011-07-12 | Disposition: A | Discharge status: Home / Self Care | Payer: Self-pay | Source: Ambulatory Visit | Attending: Cardiology | Admitting: Cardiology

## 2011-07-14 ENCOUNTER — Encounter (HOSPITAL_COMMUNITY): Payer: Self-pay

## 2011-07-17 ENCOUNTER — Encounter (HOSPITAL_COMMUNITY)
Admission: RE | Admit: 2011-07-17 | Discharge: 2011-07-17 | Disposition: A | Discharge status: Home / Self Care | Payer: Self-pay | Source: Ambulatory Visit | Attending: Cardiology | Admitting: Cardiology

## 2011-07-19 ENCOUNTER — Encounter (HOSPITAL_COMMUNITY): Payer: Self-pay

## 2011-07-21 ENCOUNTER — Encounter (HOSPITAL_COMMUNITY): Payer: Self-pay

## 2011-07-24 ENCOUNTER — Encounter (HOSPITAL_COMMUNITY): Payer: Self-pay

## 2011-07-26 ENCOUNTER — Encounter (HOSPITAL_COMMUNITY): Payer: Self-pay

## 2011-07-28 ENCOUNTER — Encounter (HOSPITAL_COMMUNITY): Payer: Self-pay

## 2011-07-31 ENCOUNTER — Encounter (HOSPITAL_COMMUNITY): Payer: Self-pay

## 2011-08-02 ENCOUNTER — Encounter (HOSPITAL_COMMUNITY): Payer: Self-pay

## 2011-08-04 ENCOUNTER — Encounter (HOSPITAL_COMMUNITY): Payer: Self-pay

## 2011-08-07 ENCOUNTER — Encounter (HOSPITAL_COMMUNITY): Payer: Self-pay

## 2011-08-09 ENCOUNTER — Encounter (HOSPITAL_COMMUNITY): Payer: Self-pay

## 2011-08-11 ENCOUNTER — Encounter (HOSPITAL_COMMUNITY)
Admission: RE | Admit: 2011-08-11 | Discharge: 2011-08-11 | Disposition: A | Discharge status: Home / Self Care | Payer: Self-pay | Source: Ambulatory Visit | Attending: Cardiology | Admitting: Cardiology

## 2011-08-11 DIAGNOSIS — C801 Malignant (primary) neoplasm, unspecified: Secondary | ICD-10-CM | POA: Insufficient documentation

## 2011-08-11 DIAGNOSIS — I200 Unstable angina: Secondary | ICD-10-CM | POA: Insufficient documentation

## 2011-08-11 DIAGNOSIS — I4891 Unspecified atrial fibrillation: Secondary | ICD-10-CM | POA: Insufficient documentation

## 2011-08-11 DIAGNOSIS — Z5189 Encounter for other specified aftercare: Secondary | ICD-10-CM | POA: Insufficient documentation

## 2011-08-11 DIAGNOSIS — Z7982 Long term (current) use of aspirin: Secondary | ICD-10-CM | POA: Insufficient documentation

## 2011-08-11 DIAGNOSIS — F1010 Alcohol abuse, uncomplicated: Secondary | ICD-10-CM | POA: Insufficient documentation

## 2011-08-11 DIAGNOSIS — Z7902 Long term (current) use of antithrombotics/antiplatelets: Secondary | ICD-10-CM | POA: Insufficient documentation

## 2011-08-11 DIAGNOSIS — Z9861 Coronary angioplasty status: Secondary | ICD-10-CM | POA: Insufficient documentation

## 2011-08-11 DIAGNOSIS — C7982 Secondary malignant neoplasm of genital organs: Secondary | ICD-10-CM | POA: Insufficient documentation

## 2011-08-11 DIAGNOSIS — I10 Essential (primary) hypertension: Secondary | ICD-10-CM | POA: Insufficient documentation

## 2011-08-14 ENCOUNTER — Encounter (HOSPITAL_COMMUNITY)
Admission: RE | Admit: 2011-08-14 | Discharge: 2011-08-14 | Disposition: A | Discharge status: Home / Self Care | Payer: Self-pay | Source: Ambulatory Visit | Attending: Cardiology | Admitting: Cardiology

## 2011-08-16 ENCOUNTER — Encounter (HOSPITAL_COMMUNITY)
Admission: RE | Admit: 2011-08-16 | Discharge: 2011-08-16 | Disposition: A | Discharge status: Home / Self Care | Payer: Self-pay | Source: Ambulatory Visit | Attending: Cardiology | Admitting: Cardiology

## 2011-08-18 ENCOUNTER — Encounter (HOSPITAL_COMMUNITY): Payer: Self-pay

## 2011-08-21 ENCOUNTER — Encounter (HOSPITAL_COMMUNITY)
Admission: RE | Admit: 2011-08-21 | Discharge: 2011-08-21 | Disposition: A | Discharge status: Home / Self Care | Payer: Self-pay | Source: Ambulatory Visit | Attending: Cardiology | Admitting: Cardiology

## 2011-08-23 ENCOUNTER — Encounter (HOSPITAL_COMMUNITY): Payer: Self-pay

## 2011-08-25 ENCOUNTER — Encounter (HOSPITAL_COMMUNITY)
Admission: RE | Admit: 2011-08-25 | Discharge: 2011-08-25 | Disposition: A | Discharge status: Home / Self Care | Payer: Self-pay | Source: Ambulatory Visit | Attending: Cardiology | Admitting: Cardiology

## 2011-08-28 ENCOUNTER — Encounter (HOSPITAL_COMMUNITY)
Admission: RE | Admit: 2011-08-28 | Discharge: 2011-08-28 | Disposition: A | Discharge status: Home / Self Care | Payer: Self-pay | Source: Ambulatory Visit | Attending: Cardiology | Admitting: Cardiology

## 2011-08-30 ENCOUNTER — Encounter (HOSPITAL_COMMUNITY): Payer: Self-pay

## 2011-09-01 ENCOUNTER — Encounter (HOSPITAL_COMMUNITY): Payer: Self-pay

## 2011-09-04 ENCOUNTER — Encounter (HOSPITAL_COMMUNITY)
Admission: RE | Admit: 2011-09-04 | Discharge: 2011-09-04 | Disposition: A | Discharge status: Home / Self Care | Payer: Self-pay | Source: Ambulatory Visit | Attending: Cardiology | Admitting: Cardiology

## 2011-09-06 ENCOUNTER — Encounter (HOSPITAL_COMMUNITY)
Admission: RE | Admit: 2011-09-06 | Discharge: 2011-09-06 | Disposition: A | Discharge status: Home / Self Care | Payer: Self-pay | Source: Ambulatory Visit | Attending: Cardiology | Admitting: Cardiology

## 2011-09-08 ENCOUNTER — Encounter (HOSPITAL_COMMUNITY)
Admission: RE | Admit: 2011-09-08 | Discharge: 2011-09-08 | Disposition: A | Discharge status: Home / Self Care | Payer: Self-pay | Source: Ambulatory Visit | Attending: Cardiology | Admitting: Cardiology

## 2011-09-08 DIAGNOSIS — I200 Unstable angina: Secondary | ICD-10-CM | POA: Insufficient documentation

## 2011-09-08 DIAGNOSIS — Z9861 Coronary angioplasty status: Secondary | ICD-10-CM | POA: Insufficient documentation

## 2011-09-08 DIAGNOSIS — Z7982 Long term (current) use of aspirin: Secondary | ICD-10-CM | POA: Insufficient documentation

## 2011-09-08 DIAGNOSIS — Z7902 Long term (current) use of antithrombotics/antiplatelets: Secondary | ICD-10-CM | POA: Insufficient documentation

## 2011-09-08 DIAGNOSIS — Z5189 Encounter for other specified aftercare: Secondary | ICD-10-CM | POA: Insufficient documentation

## 2011-09-08 DIAGNOSIS — C801 Malignant (primary) neoplasm, unspecified: Secondary | ICD-10-CM | POA: Insufficient documentation

## 2011-09-08 DIAGNOSIS — I10 Essential (primary) hypertension: Secondary | ICD-10-CM | POA: Insufficient documentation

## 2011-09-08 DIAGNOSIS — I4891 Unspecified atrial fibrillation: Secondary | ICD-10-CM | POA: Insufficient documentation

## 2011-09-08 DIAGNOSIS — F1010 Alcohol abuse, uncomplicated: Secondary | ICD-10-CM | POA: Insufficient documentation

## 2011-09-08 DIAGNOSIS — C7982 Secondary malignant neoplasm of genital organs: Secondary | ICD-10-CM | POA: Insufficient documentation

## 2011-09-11 ENCOUNTER — Telehealth: Payer: Self-pay | Admitting: *Deleted

## 2011-09-11 ENCOUNTER — Encounter (HOSPITAL_COMMUNITY): Payer: Self-pay

## 2011-09-11 NOTE — Telephone Encounter (Signed)
left message to inform the patient of the new date and time of the 02-2012 appointment 

## 2011-09-13 ENCOUNTER — Encounter (HOSPITAL_COMMUNITY)
Admission: RE | Admit: 2011-09-13 | Discharge: 2011-09-13 | Disposition: A | Discharge status: Home / Self Care | Payer: Self-pay | Source: Ambulatory Visit | Attending: Cardiology | Admitting: Cardiology

## 2011-09-15 ENCOUNTER — Encounter (HOSPITAL_COMMUNITY): Payer: Self-pay

## 2011-09-18 ENCOUNTER — Encounter (HOSPITAL_COMMUNITY)
Admission: RE | Admit: 2011-09-18 | Discharge: 2011-09-18 | Disposition: A | Discharge status: Home / Self Care | Payer: Self-pay | Source: Ambulatory Visit | Attending: Cardiology | Admitting: Cardiology

## 2011-09-20 ENCOUNTER — Encounter (HOSPITAL_COMMUNITY): Payer: Self-pay

## 2011-09-22 ENCOUNTER — Encounter (HOSPITAL_COMMUNITY)
Admission: RE | Admit: 2011-09-22 | Discharge: 2011-09-22 | Disposition: A | Discharge status: Home / Self Care | Payer: Self-pay | Source: Ambulatory Visit | Attending: Cardiology | Admitting: Cardiology

## 2011-09-25 ENCOUNTER — Encounter (HOSPITAL_COMMUNITY)
Admission: RE | Admit: 2011-09-25 | Discharge: 2011-09-25 | Disposition: A | Discharge status: Home / Self Care | Payer: Self-pay | Source: Ambulatory Visit | Attending: Cardiology | Admitting: Cardiology

## 2011-09-27 ENCOUNTER — Encounter (HOSPITAL_COMMUNITY)
Admission: RE | Admit: 2011-09-27 | Discharge: 2011-09-27 | Disposition: A | Discharge status: Home / Self Care | Payer: Self-pay | Source: Ambulatory Visit | Attending: Cardiology | Admitting: Cardiology

## 2011-09-29 ENCOUNTER — Encounter (HOSPITAL_COMMUNITY)
Admission: RE | Admit: 2011-09-29 | Discharge: 2011-09-29 | Disposition: A | Discharge status: Home / Self Care | Payer: Self-pay | Source: Ambulatory Visit | Attending: Cardiology | Admitting: Cardiology

## 2011-10-02 ENCOUNTER — Encounter (HOSPITAL_COMMUNITY)
Admission: RE | Admit: 2011-10-02 | Discharge: 2011-10-02 | Disposition: A | Discharge status: Home / Self Care | Payer: Self-pay | Source: Ambulatory Visit | Attending: Cardiology | Admitting: Cardiology

## 2011-10-04 ENCOUNTER — Encounter (HOSPITAL_COMMUNITY): Payer: Self-pay

## 2011-10-06 ENCOUNTER — Encounter (HOSPITAL_COMMUNITY)
Admission: RE | Admit: 2011-10-06 | Discharge: 2011-10-06 | Disposition: A | Discharge status: Home / Self Care | Payer: Self-pay | Source: Ambulatory Visit | Attending: Cardiology | Admitting: Cardiology

## 2011-10-06 DIAGNOSIS — C7982 Secondary malignant neoplasm of genital organs: Secondary | ICD-10-CM | POA: Insufficient documentation

## 2011-10-06 DIAGNOSIS — F1010 Alcohol abuse, uncomplicated: Secondary | ICD-10-CM | POA: Insufficient documentation

## 2011-10-06 DIAGNOSIS — Z7902 Long term (current) use of antithrombotics/antiplatelets: Secondary | ICD-10-CM | POA: Insufficient documentation

## 2011-10-06 DIAGNOSIS — I4891 Unspecified atrial fibrillation: Secondary | ICD-10-CM | POA: Insufficient documentation

## 2011-10-06 DIAGNOSIS — I200 Unstable angina: Secondary | ICD-10-CM | POA: Insufficient documentation

## 2011-10-06 DIAGNOSIS — Z7982 Long term (current) use of aspirin: Secondary | ICD-10-CM | POA: Insufficient documentation

## 2011-10-06 DIAGNOSIS — Z5189 Encounter for other specified aftercare: Secondary | ICD-10-CM | POA: Insufficient documentation

## 2011-10-06 DIAGNOSIS — C801 Malignant (primary) neoplasm, unspecified: Secondary | ICD-10-CM | POA: Insufficient documentation

## 2011-10-06 DIAGNOSIS — Z9861 Coronary angioplasty status: Secondary | ICD-10-CM | POA: Insufficient documentation

## 2011-10-06 DIAGNOSIS — I10 Essential (primary) hypertension: Secondary | ICD-10-CM | POA: Insufficient documentation

## 2011-10-09 ENCOUNTER — Encounter (HOSPITAL_COMMUNITY)
Admission: RE | Admit: 2011-10-09 | Discharge: 2011-10-09 | Disposition: A | Discharge status: Home / Self Care | Payer: Self-pay | Source: Ambulatory Visit | Attending: Cardiology | Admitting: Cardiology

## 2011-10-11 ENCOUNTER — Encounter (HOSPITAL_COMMUNITY)
Admission: RE | Admit: 2011-10-11 | Discharge: 2011-10-11 | Disposition: A | Discharge status: Home / Self Care | Payer: Self-pay | Source: Ambulatory Visit | Attending: Cardiology | Admitting: Cardiology

## 2011-10-13 ENCOUNTER — Encounter (HOSPITAL_COMMUNITY)
Admission: RE | Admit: 2011-10-13 | Discharge: 2011-10-13 | Disposition: A | Discharge status: Home / Self Care | Payer: Self-pay | Source: Ambulatory Visit | Attending: Cardiology | Admitting: Cardiology

## 2011-10-16 ENCOUNTER — Encounter (HOSPITAL_COMMUNITY): Payer: Self-pay

## 2011-10-18 ENCOUNTER — Encounter (HOSPITAL_COMMUNITY)
Admission: RE | Admit: 2011-10-18 | Discharge: 2011-10-18 | Disposition: A | Discharge status: Home / Self Care | Payer: Self-pay | Source: Ambulatory Visit | Attending: Cardiology | Admitting: Cardiology

## 2011-10-20 ENCOUNTER — Encounter (HOSPITAL_COMMUNITY)
Admission: RE | Admit: 2011-10-20 | Discharge: 2011-10-20 | Disposition: A | Discharge status: Home / Self Care | Payer: Self-pay | Source: Ambulatory Visit | Attending: Cardiology | Admitting: Cardiology

## 2011-10-23 ENCOUNTER — Encounter (HOSPITAL_COMMUNITY): Payer: Self-pay

## 2011-10-25 ENCOUNTER — Encounter (HOSPITAL_COMMUNITY): Payer: Self-pay

## 2011-10-27 ENCOUNTER — Encounter (HOSPITAL_COMMUNITY)
Admission: RE | Admit: 2011-10-27 | Discharge: 2011-10-27 | Disposition: A | Discharge status: Home / Self Care | Payer: Self-pay | Source: Ambulatory Visit | Attending: Cardiology | Admitting: Cardiology

## 2011-10-30 ENCOUNTER — Encounter (HOSPITAL_COMMUNITY)
Admission: RE | Admit: 2011-10-30 | Discharge: 2011-10-30 | Disposition: A | Discharge status: Home / Self Care | Payer: Self-pay | Source: Ambulatory Visit | Attending: Cardiology | Admitting: Cardiology

## 2011-11-01 ENCOUNTER — Encounter (HOSPITAL_COMMUNITY)
Admission: RE | Admit: 2011-11-01 | Discharge: 2011-11-01 | Disposition: A | Discharge status: Home / Self Care | Payer: Self-pay | Source: Ambulatory Visit | Attending: Cardiology | Admitting: Cardiology

## 2011-11-03 ENCOUNTER — Encounter (HOSPITAL_COMMUNITY)
Admission: RE | Admit: 2011-11-03 | Discharge: 2011-11-03 | Disposition: A | Discharge status: Home / Self Care | Payer: Self-pay | Source: Ambulatory Visit | Attending: Cardiology | Admitting: Cardiology

## 2011-11-06 ENCOUNTER — Encounter (HOSPITAL_COMMUNITY)
Admission: RE | Admit: 2011-11-06 | Discharge: 2011-11-06 | Disposition: A | Discharge status: Home / Self Care | Payer: Self-pay | Source: Ambulatory Visit | Attending: Cardiology | Admitting: Cardiology

## 2011-11-06 DIAGNOSIS — Z7902 Long term (current) use of antithrombotics/antiplatelets: Secondary | ICD-10-CM | POA: Insufficient documentation

## 2011-11-06 DIAGNOSIS — C801 Malignant (primary) neoplasm, unspecified: Secondary | ICD-10-CM | POA: Insufficient documentation

## 2011-11-06 DIAGNOSIS — Z7982 Long term (current) use of aspirin: Secondary | ICD-10-CM | POA: Insufficient documentation

## 2011-11-06 DIAGNOSIS — I200 Unstable angina: Secondary | ICD-10-CM | POA: Insufficient documentation

## 2011-11-06 DIAGNOSIS — C7982 Secondary malignant neoplasm of genital organs: Secondary | ICD-10-CM | POA: Insufficient documentation

## 2011-11-06 DIAGNOSIS — I10 Essential (primary) hypertension: Secondary | ICD-10-CM | POA: Insufficient documentation

## 2011-11-06 DIAGNOSIS — Z5189 Encounter for other specified aftercare: Secondary | ICD-10-CM | POA: Insufficient documentation

## 2011-11-06 DIAGNOSIS — I4891 Unspecified atrial fibrillation: Secondary | ICD-10-CM | POA: Insufficient documentation

## 2011-11-06 DIAGNOSIS — Z9861 Coronary angioplasty status: Secondary | ICD-10-CM | POA: Insufficient documentation

## 2011-11-06 DIAGNOSIS — F1010 Alcohol abuse, uncomplicated: Secondary | ICD-10-CM | POA: Insufficient documentation

## 2011-11-08 ENCOUNTER — Encounter (HOSPITAL_COMMUNITY): Payer: Self-pay

## 2011-11-10 ENCOUNTER — Encounter (HOSPITAL_COMMUNITY)
Admission: RE | Admit: 2011-11-10 | Discharge: 2011-11-10 | Disposition: A | Discharge status: Home / Self Care | Payer: Self-pay | Source: Ambulatory Visit | Attending: Cardiology | Admitting: Cardiology

## 2011-11-13 ENCOUNTER — Encounter (HOSPITAL_COMMUNITY)
Admission: RE | Admit: 2011-11-13 | Discharge: 2011-11-13 | Disposition: A | Discharge status: Home / Self Care | Payer: Self-pay | Source: Ambulatory Visit | Attending: Cardiology | Admitting: Cardiology

## 2011-11-15 ENCOUNTER — Encounter (HOSPITAL_COMMUNITY): Payer: Self-pay

## 2011-11-17 ENCOUNTER — Encounter (HOSPITAL_COMMUNITY)
Admission: RE | Admit: 2011-11-17 | Discharge: 2011-11-17 | Disposition: A | Discharge status: Home / Self Care | Payer: Self-pay | Source: Ambulatory Visit | Attending: Cardiology | Admitting: Cardiology

## 2011-11-20 ENCOUNTER — Encounter (HOSPITAL_COMMUNITY)
Admission: RE | Admit: 2011-11-20 | Discharge: 2011-11-20 | Disposition: A | Discharge status: Home / Self Care | Payer: Self-pay | Source: Ambulatory Visit | Attending: Cardiology | Admitting: Cardiology

## 2011-11-22 ENCOUNTER — Encounter (HOSPITAL_COMMUNITY)
Admission: RE | Admit: 2011-11-22 | Discharge: 2011-11-22 | Disposition: A | Discharge status: Home / Self Care | Payer: Self-pay | Source: Ambulatory Visit | Attending: Cardiology | Admitting: Cardiology

## 2011-11-24 ENCOUNTER — Encounter (HOSPITAL_COMMUNITY)
Admission: RE | Admit: 2011-11-24 | Discharge: 2011-11-24 | Disposition: A | Discharge status: Home / Self Care | Payer: Self-pay | Source: Ambulatory Visit | Attending: Cardiology | Admitting: Cardiology

## 2011-11-27 ENCOUNTER — Encounter (HOSPITAL_COMMUNITY)
Admission: RE | Admit: 2011-11-27 | Discharge: 2011-11-27 | Disposition: A | Discharge status: Home / Self Care | Payer: Self-pay | Source: Ambulatory Visit | Attending: Cardiology | Admitting: Cardiology

## 2011-11-29 ENCOUNTER — Encounter (HOSPITAL_COMMUNITY)
Admission: RE | Admit: 2011-11-29 | Discharge: 2011-11-29 | Disposition: A | Discharge status: Home / Self Care | Payer: Self-pay | Source: Ambulatory Visit | Attending: Cardiology | Admitting: Cardiology

## 2011-12-01 ENCOUNTER — Encounter (HOSPITAL_COMMUNITY)
Admission: RE | Admit: 2011-12-01 | Discharge: 2011-12-01 | Disposition: A | Discharge status: Home / Self Care | Payer: Self-pay | Source: Ambulatory Visit | Attending: Cardiology | Admitting: Cardiology

## 2011-12-04 ENCOUNTER — Encounter (HOSPITAL_COMMUNITY)
Admission: RE | Admit: 2011-12-04 | Discharge: 2011-12-04 | Disposition: A | Discharge status: Home / Self Care | Payer: Self-pay | Source: Ambulatory Visit | Attending: Cardiology | Admitting: Cardiology

## 2011-12-06 ENCOUNTER — Encounter (HOSPITAL_COMMUNITY): Payer: Self-pay

## 2011-12-06 DIAGNOSIS — Z9861 Coronary angioplasty status: Secondary | ICD-10-CM | POA: Insufficient documentation

## 2011-12-06 DIAGNOSIS — Z7902 Long term (current) use of antithrombotics/antiplatelets: Secondary | ICD-10-CM | POA: Insufficient documentation

## 2011-12-06 DIAGNOSIS — Z7982 Long term (current) use of aspirin: Secondary | ICD-10-CM | POA: Insufficient documentation

## 2011-12-06 DIAGNOSIS — Z5189 Encounter for other specified aftercare: Secondary | ICD-10-CM | POA: Insufficient documentation

## 2011-12-06 DIAGNOSIS — C801 Malignant (primary) neoplasm, unspecified: Secondary | ICD-10-CM | POA: Insufficient documentation

## 2011-12-06 DIAGNOSIS — F1010 Alcohol abuse, uncomplicated: Secondary | ICD-10-CM | POA: Insufficient documentation

## 2011-12-06 DIAGNOSIS — I10 Essential (primary) hypertension: Secondary | ICD-10-CM | POA: Insufficient documentation

## 2011-12-06 DIAGNOSIS — I200 Unstable angina: Secondary | ICD-10-CM | POA: Insufficient documentation

## 2011-12-06 DIAGNOSIS — C7982 Secondary malignant neoplasm of genital organs: Secondary | ICD-10-CM | POA: Insufficient documentation

## 2011-12-06 DIAGNOSIS — I4891 Unspecified atrial fibrillation: Secondary | ICD-10-CM | POA: Insufficient documentation

## 2011-12-08 ENCOUNTER — Encounter (HOSPITAL_COMMUNITY): Payer: Self-pay

## 2011-12-11 ENCOUNTER — Encounter (HOSPITAL_COMMUNITY)
Admission: RE | Admit: 2011-12-11 | Discharge: 2011-12-11 | Disposition: A | Discharge status: Home / Self Care | Payer: Self-pay | Source: Ambulatory Visit | Attending: Cardiology | Admitting: Cardiology

## 2011-12-13 ENCOUNTER — Encounter (HOSPITAL_COMMUNITY)
Admission: RE | Admit: 2011-12-13 | Discharge: 2011-12-13 | Disposition: A | Discharge status: Home / Self Care | Payer: Self-pay | Source: Ambulatory Visit | Attending: Cardiology | Admitting: Cardiology

## 2011-12-15 ENCOUNTER — Encounter (HOSPITAL_COMMUNITY)
Admission: RE | Admit: 2011-12-15 | Discharge: 2011-12-15 | Disposition: A | Discharge status: Home / Self Care | Payer: Self-pay | Source: Ambulatory Visit | Attending: Cardiology | Admitting: Cardiology

## 2011-12-18 ENCOUNTER — Encounter (HOSPITAL_COMMUNITY)
Admission: RE | Admit: 2011-12-18 | Discharge: 2011-12-18 | Disposition: A | Discharge status: Home / Self Care | Payer: Self-pay | Source: Ambulatory Visit | Attending: Cardiology | Admitting: Cardiology

## 2011-12-20 ENCOUNTER — Encounter (HOSPITAL_COMMUNITY)
Admission: RE | Admit: 2011-12-20 | Discharge: 2011-12-20 | Disposition: A | Discharge status: Home / Self Care | Payer: Self-pay | Source: Ambulatory Visit | Attending: Cardiology | Admitting: Cardiology

## 2011-12-22 ENCOUNTER — Encounter (HOSPITAL_COMMUNITY): Payer: Self-pay

## 2011-12-25 ENCOUNTER — Encounter (HOSPITAL_COMMUNITY)
Admission: RE | Admit: 2011-12-25 | Discharge: 2011-12-25 | Disposition: A | Discharge status: Home / Self Care | Payer: Self-pay | Source: Ambulatory Visit | Attending: Cardiology | Admitting: Cardiology

## 2011-12-27 ENCOUNTER — Other Ambulatory Visit: Payer: Self-pay | Admitting: Gastroenterology

## 2011-12-27 ENCOUNTER — Encounter (HOSPITAL_COMMUNITY): Payer: Self-pay

## 2011-12-29 ENCOUNTER — Encounter (HOSPITAL_COMMUNITY)
Admission: RE | Admit: 2011-12-29 | Discharge: 2011-12-29 | Disposition: A | Discharge status: Home / Self Care | Payer: Self-pay | Source: Ambulatory Visit | Attending: Cardiology | Admitting: Cardiology

## 2012-01-01 ENCOUNTER — Encounter (HOSPITAL_COMMUNITY): Payer: Self-pay

## 2012-01-03 ENCOUNTER — Encounter (HOSPITAL_COMMUNITY)
Admission: RE | Admit: 2012-01-03 | Discharge: 2012-01-03 | Disposition: A | Discharge status: Home / Self Care | Payer: Self-pay | Source: Ambulatory Visit | Attending: Cardiology | Admitting: Cardiology

## 2012-01-05 ENCOUNTER — Encounter (HOSPITAL_COMMUNITY)
Admission: RE | Admit: 2012-01-05 | Discharge: 2012-01-05 | Disposition: A | Discharge status: Home / Self Care | Payer: Self-pay | Source: Ambulatory Visit | Attending: Cardiology | Admitting: Cardiology

## 2012-01-08 ENCOUNTER — Encounter (HOSPITAL_COMMUNITY)
Admission: RE | Admit: 2012-01-08 | Discharge: 2012-01-08 | Disposition: A | Discharge status: Home / Self Care | Payer: Self-pay | Source: Ambulatory Visit | Attending: Cardiology | Admitting: Cardiology

## 2012-01-08 DIAGNOSIS — C7982 Secondary malignant neoplasm of genital organs: Secondary | ICD-10-CM | POA: Insufficient documentation

## 2012-01-08 DIAGNOSIS — Z7902 Long term (current) use of antithrombotics/antiplatelets: Secondary | ICD-10-CM | POA: Insufficient documentation

## 2012-01-08 DIAGNOSIS — I200 Unstable angina: Secondary | ICD-10-CM | POA: Insufficient documentation

## 2012-01-08 DIAGNOSIS — C801 Malignant (primary) neoplasm, unspecified: Secondary | ICD-10-CM | POA: Insufficient documentation

## 2012-01-08 DIAGNOSIS — F1010 Alcohol abuse, uncomplicated: Secondary | ICD-10-CM | POA: Insufficient documentation

## 2012-01-08 DIAGNOSIS — I10 Essential (primary) hypertension: Secondary | ICD-10-CM | POA: Insufficient documentation

## 2012-01-08 DIAGNOSIS — Z9861 Coronary angioplasty status: Secondary | ICD-10-CM | POA: Insufficient documentation

## 2012-01-08 DIAGNOSIS — I4891 Unspecified atrial fibrillation: Secondary | ICD-10-CM | POA: Insufficient documentation

## 2012-01-08 DIAGNOSIS — Z5189 Encounter for other specified aftercare: Secondary | ICD-10-CM | POA: Insufficient documentation

## 2012-01-08 DIAGNOSIS — Z7982 Long term (current) use of aspirin: Secondary | ICD-10-CM | POA: Insufficient documentation

## 2012-01-10 ENCOUNTER — Encounter (HOSPITAL_COMMUNITY)
Admission: RE | Admit: 2012-01-10 | Discharge: 2012-01-10 | Disposition: A | Discharge status: Home / Self Care | Payer: Self-pay | Source: Ambulatory Visit | Attending: Cardiology | Admitting: Cardiology

## 2012-01-12 ENCOUNTER — Encounter (HOSPITAL_COMMUNITY): Payer: Self-pay

## 2012-01-15 ENCOUNTER — Encounter (HOSPITAL_COMMUNITY): Payer: Self-pay

## 2012-01-17 ENCOUNTER — Encounter (HOSPITAL_COMMUNITY)
Admission: RE | Admit: 2012-01-17 | Discharge: 2012-01-17 | Disposition: A | Discharge status: Home / Self Care | Payer: Self-pay | Source: Ambulatory Visit | Attending: Cardiology | Admitting: Cardiology

## 2012-01-19 ENCOUNTER — Encounter (HOSPITAL_COMMUNITY)
Admission: RE | Admit: 2012-01-19 | Discharge: 2012-01-19 | Disposition: A | Discharge status: Home / Self Care | Payer: Self-pay | Source: Ambulatory Visit | Attending: Cardiology | Admitting: Cardiology

## 2012-01-22 ENCOUNTER — Encounter (HOSPITAL_COMMUNITY)
Admission: RE | Admit: 2012-01-22 | Discharge: 2012-01-22 | Disposition: A | Discharge status: Home / Self Care | Payer: Self-pay | Source: Ambulatory Visit | Attending: Cardiology | Admitting: Cardiology

## 2012-01-24 ENCOUNTER — Encounter (HOSPITAL_COMMUNITY): Payer: Self-pay

## 2012-01-26 ENCOUNTER — Encounter (HOSPITAL_COMMUNITY): Payer: Self-pay

## 2012-01-29 ENCOUNTER — Encounter (HOSPITAL_COMMUNITY): Payer: Self-pay

## 2012-01-31 ENCOUNTER — Encounter (HOSPITAL_COMMUNITY)
Admission: RE | Admit: 2012-01-31 | Discharge: 2012-01-31 | Disposition: A | Discharge status: Home / Self Care | Payer: Self-pay | Source: Ambulatory Visit | Attending: Cardiology | Admitting: Cardiology

## 2012-02-02 ENCOUNTER — Encounter (HOSPITAL_COMMUNITY)
Admission: RE | Admit: 2012-02-02 | Discharge: 2012-02-02 | Disposition: A | Discharge status: Home / Self Care | Payer: Self-pay | Source: Ambulatory Visit | Attending: Cardiology | Admitting: Cardiology

## 2012-02-05 ENCOUNTER — Encounter (HOSPITAL_COMMUNITY)
Admission: RE | Admit: 2012-02-05 | Discharge: 2012-02-05 | Disposition: A | Discharge status: Home / Self Care | Payer: Self-pay | Source: Ambulatory Visit | Attending: Cardiology | Admitting: Cardiology

## 2012-02-05 DIAGNOSIS — F1010 Alcohol abuse, uncomplicated: Secondary | ICD-10-CM | POA: Insufficient documentation

## 2012-02-05 DIAGNOSIS — Z9861 Coronary angioplasty status: Secondary | ICD-10-CM | POA: Insufficient documentation

## 2012-02-05 DIAGNOSIS — Z5189 Encounter for other specified aftercare: Secondary | ICD-10-CM | POA: Insufficient documentation

## 2012-02-05 DIAGNOSIS — C7982 Secondary malignant neoplasm of genital organs: Secondary | ICD-10-CM | POA: Insufficient documentation

## 2012-02-05 DIAGNOSIS — I200 Unstable angina: Secondary | ICD-10-CM | POA: Insufficient documentation

## 2012-02-05 DIAGNOSIS — I4891 Unspecified atrial fibrillation: Secondary | ICD-10-CM | POA: Insufficient documentation

## 2012-02-05 DIAGNOSIS — Z7902 Long term (current) use of antithrombotics/antiplatelets: Secondary | ICD-10-CM | POA: Insufficient documentation

## 2012-02-05 DIAGNOSIS — I10 Essential (primary) hypertension: Secondary | ICD-10-CM | POA: Insufficient documentation

## 2012-02-05 DIAGNOSIS — Z7982 Long term (current) use of aspirin: Secondary | ICD-10-CM | POA: Insufficient documentation

## 2012-02-05 DIAGNOSIS — C801 Malignant (primary) neoplasm, unspecified: Secondary | ICD-10-CM | POA: Insufficient documentation

## 2012-02-06 ENCOUNTER — Other Ambulatory Visit (HOSPITAL_BASED_OUTPATIENT_CLINIC_OR_DEPARTMENT_OTHER): Payer: 59 | Admitting: Lab

## 2012-02-06 DIAGNOSIS — C61 Malignant neoplasm of prostate: Secondary | ICD-10-CM

## 2012-02-06 DIAGNOSIS — C801 Malignant (primary) neoplasm, unspecified: Secondary | ICD-10-CM

## 2012-02-06 DIAGNOSIS — C7951 Secondary malignant neoplasm of bone: Secondary | ICD-10-CM

## 2012-02-06 LAB — COMPREHENSIVE METABOLIC PANEL
Albumin: 4.3 g/dL (ref 3.5–5.2)
Alkaline Phosphatase: 65 U/L (ref 39–117)
BUN: 12 mg/dL (ref 6–23)
CO2: 23 mEq/L (ref 19–32)
Glucose, Bld: 99 mg/dL (ref 70–99)
Potassium: 4 mEq/L (ref 3.5–5.3)
Sodium: 139 mEq/L (ref 135–145)
Total Bilirubin: 0.6 mg/dL (ref 0.3–1.2)
Total Protein: 7.4 g/dL (ref 6.0–8.3)

## 2012-02-06 LAB — CBC WITH DIFFERENTIAL/PLATELET
Basophils Absolute: 0.1 10*3/uL (ref 0.0–0.1)
Eosinophils Absolute: 0.5 10*3/uL (ref 0.0–0.5)
HGB: 13 g/dL (ref 13.0–17.1)
LYMPH%: 27.4 % (ref 14.0–49.0)
MCV: 103.7 fL — ABNORMAL HIGH (ref 79.3–98.0)
MONO#: 0.6 10*3/uL (ref 0.1–0.9)
MONO%: 8.7 % (ref 0.0–14.0)
NEUT#: 4.1 10*3/uL (ref 1.5–6.5)
Platelets: 172 10*3/uL (ref 140–400)
RBC: 3.62 10*6/uL — ABNORMAL LOW (ref 4.20–5.82)
WBC: 7.3 10*3/uL (ref 4.0–10.3)

## 2012-02-06 LAB — LACTATE DEHYDROGENASE: LDH: 184 U/L (ref 94–250)

## 2012-02-06 LAB — PSA: PSA: 4.09 ng/mL — ABNORMAL HIGH (ref <=4.00)

## 2012-02-07 ENCOUNTER — Encounter (HOSPITAL_COMMUNITY): Payer: Self-pay

## 2012-02-09 ENCOUNTER — Encounter (HOSPITAL_COMMUNITY): Payer: Self-pay

## 2012-02-12 ENCOUNTER — Encounter (HOSPITAL_COMMUNITY): Payer: Self-pay

## 2012-02-13 ENCOUNTER — Encounter: Payer: Self-pay | Admitting: Oncology

## 2012-02-13 ENCOUNTER — Ambulatory Visit (HOSPITAL_BASED_OUTPATIENT_CLINIC_OR_DEPARTMENT_OTHER): Payer: 59 | Admitting: Oncology

## 2012-02-13 ENCOUNTER — Telehealth: Payer: Self-pay | Admitting: Oncology

## 2012-02-13 VITALS — BP 130/84 | HR 73 | Temp 98.1°F | Ht 74.0 in | Wt 228.1 lb

## 2012-02-13 DIAGNOSIS — Z955 Presence of coronary angioplasty implant and graft: Secondary | ICD-10-CM

## 2012-02-13 DIAGNOSIS — C7952 Secondary malignant neoplasm of bone marrow: Secondary | ICD-10-CM

## 2012-02-13 DIAGNOSIS — I2510 Atherosclerotic heart disease of native coronary artery without angina pectoris: Secondary | ICD-10-CM

## 2012-02-13 DIAGNOSIS — F1010 Alcohol abuse, uncomplicated: Secondary | ICD-10-CM

## 2012-02-13 DIAGNOSIS — C61 Malignant neoplasm of prostate: Secondary | ICD-10-CM | POA: Insufficient documentation

## 2012-02-13 DIAGNOSIS — F1020 Alcohol dependence, uncomplicated: Secondary | ICD-10-CM

## 2012-02-13 HISTORY — DX: Alcohol dependence, uncomplicated: F10.20

## 2012-02-13 NOTE — Telephone Encounter (Signed)
Gave pt appt for Ct, bone and Chest for 7/15 @ [facility] and then pt will draw lab before MD in September 2013

## 2012-02-13 NOTE — Progress Notes (Signed)
Hematology and Oncology Follow Up Visit  Allen Schroeder [id number] August 28, 1950 61 y.o. 02/13/2012 7:31 PM   Principle Diagnosis: Encounter Diagnoses  Name Primary?  . Prostate cancer, primary, with metastasis from prostate to other site Yes  . Alcohol abuse with physiological dependence   . CAD in native artery   . S/P coronary artery stent placement   . History of placement of stent in LAD coronary artery      Interim History:  Followup visit for this 61 year old man with hormone sensitive prostate cancer widely metastatic to bone at diagnosis in November 2009. He was started on monthly Fergon injections along with monthly Zometa infusions. He had a rapid normalization of his PSA. Of note, he had no bone pain despite extensive bone disease. He is primarily followed by his urologist. I have not seen him since last July. He has had no interim medical problems. He has had some intermittent low back pain which flared up a few months ago after a golf game but now has subsided back to baseline. He has an intermittent chronic neck pain. No new areas of pain. No change in his usual nocturia x4-5. No hematuria. He continues to consume alcohol on a daily basis and makes no apologies for it. He states his pain regimen of choice is alcohol plus ibuprofen. Lab done in anticipation of today's visit now shows a rise in his PSA from 0.24 recorded on June 2012 to 4.09 on 02/06/2012. Alkaline phosphatase and calcium remain normal.  Medications: reviewed  Allergies:  Allergies  Allergen Reactions  . Nucynta (Tapentadol Hydrochloride) Hives    Review of Systems: Constitutional:   No constitutional symptoms Respiratory: No cough or dyspnea Cardiovascular:  No chest pain or palpitations Gastrointestinal: No abdominal pain no hematochezia or melena Genito-Urinary: See above Musculoskeletal: See above Neurologic: No focal weakness Skin: No rash Remaining ROS negative.  Physical Exam: Blood pressure  130/84, pulse 73, temperature 98.1 F (36.7 C), temperature source Oral, height 6\' 2"  (1.88 m), weight 228 lb 1.6 oz (103.465 kg). Wt Readings from Last 3 Encounters:  02/13/12 228 lb 1.6 oz (103.465 kg)  01/13/11 222 lb (100.699 kg)  12/20/10 217 lb 6.4 oz (98.612 kg)     General appearance: Well-nourished Caucasian man HENNT: Pharynx no erythema or exudate Lymph nodes: No adenopathy Breasts: Lungs: Clear to auscultation resonant to percussion Heart: Regular rhythm no murmur Abdomen: Soft nontender no mass no organomegaly Extremities: No edema no calf tenderness Vascular: No cyanosis Neurologic: Motor strength 5 over 5. Reflexes 1+ symmetric Skin: No rash or ecchymosis  Lab Results: Lab Results  Component Value Date   WBC 7.3 02/06/2012   HGB 13.0 02/06/2012   HCT 37.6* 02/06/2012   MCV 103.7* 02/06/2012   PLT 172 02/06/2012     Chemistry      Component Value Date/Time   NA 139 02/06/2012 1346   K 4.0 02/06/2012 1346   CL 105 02/06/2012 1346   CO2 23 02/06/2012 1346   BUN 12 02/06/2012 1346   CREATININE 0.90 02/06/2012 1346      Component Value Date/Time   CALCIUM 8.8 02/06/2012 1346   ALKPHOS 65 02/06/2012 1346   AST 57* 02/06/2012 1346   ALT 29 02/06/2012 1346   BILITOT 0.6 02/06/2012 1346       Radiological Studies: No results found.  Impression and Plan: #1. Prostate cancer metastatic to bone. New rise in PSA now out almost 4 years from diagnosis in initiation of Fergon in November 2009.  Plan: I will obtain a chest x-ray, bone scan, and CT scan of the abdomen and pelvis. Fortunately we now have available two new novel androgen receptor blockers. I'm going to start him on enzalutamide 40 mg capsules 4 capsules taken together in the morning daily. I told him the current therapy, which I don't believe, is that [facility] Rh agonists should be continued to control whatever hormone sensitive clones of cancer remain. To make his life simpler in view of progression on Fergon, I would personally  favor changing him to a quarterly Lupron preparation. I will discuss this with his urologist. I would continue his Zometa but not that he has been on the drug for over 3 years I think we should decrease to every 2-3 month dosing to minimize chance he develops osteonecrosis of the jaw. I'll see him again in 2 months to see how he tolerates the enzalutamide and to check lab.  #2. Ongoing alcohol use  #3. Coronary artery disease status post bare-metal stent to the LAD August 2011  #4. History of acute atrial fibrillation presenting June 2010 controlled on medication.   CC:. Dr. Sheryn Bison; Dr. Jethro Bolus   Levert Feinstein, MD 7/9/20137:31 PM

## 2012-02-14 ENCOUNTER — Encounter (HOSPITAL_COMMUNITY)
Admission: RE | Admit: 2012-02-14 | Discharge: 2012-02-14 | Disposition: A | Discharge status: Home / Self Care | Payer: Self-pay | Source: Ambulatory Visit | Attending: Cardiology | Admitting: Cardiology

## 2012-02-14 ENCOUNTER — Other Ambulatory Visit: Payer: Self-pay | Admitting: *Deleted

## 2012-02-14 DIAGNOSIS — C61 Malignant neoplasm of prostate: Secondary | ICD-10-CM

## 2012-02-14 MED ORDER — ENZALUTAMIDE 40 MG PO CAPS: 160.0000 mg | ORAL_CAPSULE | Freq: Every day | ORAL | Status: DC

## 2012-02-16 ENCOUNTER — Encounter (HOSPITAL_COMMUNITY): Payer: Self-pay

## 2012-02-19 ENCOUNTER — Encounter (HOSPITAL_COMMUNITY): Payer: Self-pay

## 2012-02-19 ENCOUNTER — Ambulatory Visit (HOSPITAL_COMMUNITY)
Admission: RE | Admit: 2012-02-19 | Discharge: 2012-02-19 | Disposition: A | Discharge status: Home / Self Care | Payer: 59 | Source: Ambulatory Visit | Attending: Oncology | Admitting: Oncology

## 2012-02-19 ENCOUNTER — Encounter (HOSPITAL_COMMUNITY)
Admission: RE | Admit: 2012-02-19 | Discharge: 2012-02-19 | Disposition: A | Discharge status: Home / Self Care | Payer: 59 | Source: Ambulatory Visit | Attending: Oncology | Admitting: Oncology

## 2012-02-19 ENCOUNTER — Encounter: Payer: Self-pay | Admitting: *Deleted

## 2012-02-19 ENCOUNTER — Other Ambulatory Visit: Payer: Self-pay | Admitting: Oncology

## 2012-02-19 DIAGNOSIS — K7689 Other specified diseases of liver: Secondary | ICD-10-CM | POA: Insufficient documentation

## 2012-02-19 DIAGNOSIS — C7951 Secondary malignant neoplasm of bone: Secondary | ICD-10-CM | POA: Insufficient documentation

## 2012-02-19 DIAGNOSIS — I700 Atherosclerosis of aorta: Secondary | ICD-10-CM | POA: Insufficient documentation

## 2012-02-19 DIAGNOSIS — C61 Malignant neoplasm of prostate: Secondary | ICD-10-CM

## 2012-02-19 DIAGNOSIS — Z9089 Acquired absence of other organs: Secondary | ICD-10-CM | POA: Insufficient documentation

## 2012-02-19 DIAGNOSIS — I723 Aneurysm of iliac artery: Secondary | ICD-10-CM | POA: Insufficient documentation

## 2012-02-19 DIAGNOSIS — M5126 Other intervertebral disc displacement, lumbar region: Secondary | ICD-10-CM | POA: Insufficient documentation

## 2012-02-19 DIAGNOSIS — Q619 Cystic kidney disease, unspecified: Secondary | ICD-10-CM | POA: Insufficient documentation

## 2012-02-19 MED ORDER — TECHNETIUM TC 99M MEDRONATE IV KIT
25.0000 | PACK | Freq: Once | INTRAVENOUS | Status: AC | PRN
  Administered 2012-02-19: 25 via INTRAVENOUS

## 2012-02-19 MED ORDER — IOHEXOL 300 MG/ML  SOLN
100.0000 mL | Freq: Once | INTRAMUSCULAR | Status: AC | PRN
  Administered 2012-02-19: 100 mL via INTRAVENOUS

## 2012-02-20 ENCOUNTER — Telehealth: Payer: Self-pay | Admitting: *Deleted

## 2012-02-20 ENCOUNTER — Ambulatory Visit (INDEPENDENT_AMBULATORY_CARE_PROVIDER_SITE_OTHER): Payer: 59 | Admitting: Gastroenterology

## 2012-02-20 ENCOUNTER — Encounter: Payer: Self-pay | Admitting: Gastroenterology

## 2012-02-20 VITALS — BP 110/72 | HR 80 | Ht 74.0 in | Wt 225.4 lb

## 2012-02-20 DIAGNOSIS — S301XXA Contusion of abdominal wall, initial encounter: Secondary | ICD-10-CM

## 2012-02-20 DIAGNOSIS — Z8546 Personal history of malignant neoplasm of prostate: Secondary | ICD-10-CM

## 2012-02-20 DIAGNOSIS — K709 Alcoholic liver disease, unspecified: Secondary | ICD-10-CM

## 2012-02-20 MED ORDER — FOLIC ACID 1 MG PO TABS: ORAL_TABLET | ORAL | Status: DC

## 2012-02-20 NOTE — Telephone Encounter (Signed)
Pt notified of recent x-ray results & copy routed to Dr Sheryn Bison & Dr. Patsi Sears.

## 2012-02-20 NOTE — Progress Notes (Signed)
This is a 61 year old Caucasian male with metastatic prostate carcinoma followed by urology and oncology. He has had a recent elevation in his PSA level, and is to begin  Xtandi 160 mg a day. He currently is on Firmagon injections every 30 days and has had some anterior abdominal wall hematoma difficulties. He continues use ethanol rather heavily, but denies problems with intoxication or social problems. His father recently died, and the patient handled the stress without difficulty. He is not on pain medication or sedatives at this time. He has been advised in the past to stay on PPI medications per previous NSAID upper GI bleeding, but he does not take these medications, and denies dyspepsia or acid reflux symptoms. He also denies any hepatobiliary or lower gastrointestinal issues such as rectal bleeding or melena. Recent bone scan was reviewed and is stable without evidence of new metastasis. He is followed by Dr. Juanito Doom in cardiology, and he is on Plavix and Betapace for his atrial fibrillation.     Current Medications, Allergies, Past Medical History, Past Surgical History, Family History and Social History were reviewed in [employer] record.  Pertinent Review of Systems Negative... chronic musculoskeletal right lower back pain which is unchanged from previous visits. He denies cardiovascular or pulmonary or other genitourinary symptoms at this time.   Physical Exam: Healthy-appearing patient in no acute distress. Blood pressure today 110/72, pulse 80 and regular, and weight 225 pounds the BMI of 28.94. I cannot appreciate stigmata of chronic liver disease. His chest is clear to percussion of dictation. He appears to be in a regular rhythm without murmurs gallops or rubs. He has an acute 4-5 cm right upper quadrant hematoma that is firm, tender, and discolored related to recent injections with another left upper quadrant smaller hematoma without discoloration. I cannot  appreciate splenomegaly, hepatomegaly, ascites, and bowel sounds are normal. There is no peripheral edema or phlebitis. Mental status is normal and there is no asterixis. His memory is entirely intact.    Assessment and plan: His SGPT is slightly elevated at 57 with otherwise normal liver enzymes. Previous evaluation showed no evidence of cirrhosis. I am encouraged that he is doing as well as he has done, and I again have counseled him about alcohol moderation. He is to see Dr. Patsi Sears tomorrow for urologic review and consideration of new therapies for his prostate cancer metastasis. His anterior abdominal wall hematomas are probably related to his chronic Plavix use. He is asymptomatic in terms of any cardiovascular or gastrointestinal issues at this time. I've advised him to avoid NSAIDs, and to use omeprazole if he takes these medications. He has a chronic macrocytic anemia related to alcohol use, and is on daily folic acid 1 mg. I will continue to see him on a when necessary basis or every 6 months as needed.  Copy Dr.Tannebaum,Dr,Granfortuna,and Dr. Juanito Doom,,,, No diagnosis found.

## 2012-02-20 NOTE — Patient Instructions (Addendum)
We will renew your Folic Acid today

## 2012-02-20 NOTE — Telephone Encounter (Signed)
Message copied by Sabino Snipes on Tue Feb 20, 2012  5:04 PM ------      Message from: Levert Feinstein      Created: Mon Feb 19, 2012  5:31 PM       Call pt - no new bone lesions;  Slipped disc L4-5; CXR normal      Route results to Dr Sheryn Bison & Sig Patsi Sears please

## 2012-02-21 ENCOUNTER — Encounter (HOSPITAL_COMMUNITY): Payer: Self-pay

## 2012-02-23 ENCOUNTER — Encounter (HOSPITAL_COMMUNITY)
Admission: RE | Admit: 2012-02-23 | Discharge: 2012-02-23 | Disposition: A | Discharge status: Home / Self Care | Payer: Self-pay | Source: Ambulatory Visit | Attending: Cardiology | Admitting: Cardiology

## 2012-02-26 ENCOUNTER — Encounter (HOSPITAL_COMMUNITY)
Admission: RE | Admit: 2012-02-26 | Discharge: 2012-02-26 | Disposition: A | Discharge status: Home / Self Care | Payer: Self-pay | Source: Ambulatory Visit | Attending: Cardiology | Admitting: Cardiology

## 2012-02-28 ENCOUNTER — Encounter (HOSPITAL_COMMUNITY)
Admission: RE | Admit: 2012-02-28 | Discharge: 2012-02-28 | Disposition: A | Discharge status: Home / Self Care | Payer: Self-pay | Source: Ambulatory Visit | Attending: Cardiology | Admitting: Cardiology

## 2012-02-28 ENCOUNTER — Encounter: Payer: Self-pay | Admitting: Oncology

## 2012-02-28 NOTE — Progress Notes (Signed)
Patient call and had a question about a medication that he is getting ready to start Enzalutamide.I did give the message to Tanna Savoy nurse to follow through and call the patient.

## 2012-02-29 ENCOUNTER — Telehealth: Payer: Self-pay | Admitting: *Deleted

## 2012-02-29 NOTE — Telephone Encounter (Signed)
Pt notified that Dr.Granfortuna OK for him to take the lupron inj. along with the xtandi.  He will call us tomorrow with ph #'s for the [facility].

## 2012-02-29 NOTE — Telephone Encounter (Signed)
Received call from pt stating that Dr Cyndie Chime gave him a script for xtandi & he states that he has [facility] insurance & script needs to go to [facility] they need a call from [place].  He will call back with phone #.  He states that it will cost $8000/mo.  He also states that his urologist is prescribing lupron inj. & wants to know if he should be on both.  He is more concerned about side effects than the cost.  Message left with Elizabeth/Managed Care to f/u on &  note to Dr Cyndie Chime.

## 2012-03-01 ENCOUNTER — Encounter (HOSPITAL_COMMUNITY)
Admission: RE | Admit: 2012-03-01 | Discharge: 2012-03-01 | Disposition: A | Discharge status: Home / Self Care | Payer: Self-pay | Source: Ambulatory Visit | Attending: Cardiology | Admitting: Cardiology

## 2012-03-04 ENCOUNTER — Encounter (HOSPITAL_COMMUNITY)
Admission: RE | Admit: 2012-03-04 | Discharge: 2012-03-04 | Disposition: A | Discharge status: Home / Self Care | Payer: Self-pay | Source: Ambulatory Visit | Attending: Cardiology | Admitting: Cardiology

## 2012-03-06 ENCOUNTER — Telehealth: Payer: Self-pay | Admitting: *Deleted

## 2012-03-06 ENCOUNTER — Encounter (HOSPITAL_COMMUNITY)
Admission: RE | Admit: 2012-03-06 | Discharge: 2012-03-06 | Disposition: A | Discharge status: Home / Self Care | Payer: Self-pay | Source: Ambulatory Visit | Attending: Cardiology | Admitting: Cardiology

## 2012-03-06 ENCOUNTER — Encounter: Payer: Self-pay | Admitting: *Deleted

## 2012-03-06 NOTE — Telephone Encounter (Signed)
Faxed script for xtandi 40 mg # 120 with instructions to take 4 tabs daily & 5 refills to [facility] per pt request along with insurance information.  Verified with [facility] that this is correct [facility].

## 2012-03-08 ENCOUNTER — Encounter (HOSPITAL_COMMUNITY)
Admission: RE | Admit: 2012-03-08 | Discharge: 2012-03-08 | Disposition: A | Discharge status: Home / Self Care | Payer: Self-pay | Source: Ambulatory Visit | Attending: Cardiology | Admitting: Cardiology

## 2012-03-08 DIAGNOSIS — Z9861 Coronary angioplasty status: Secondary | ICD-10-CM | POA: Insufficient documentation

## 2012-03-08 DIAGNOSIS — Z7982 Long term (current) use of aspirin: Secondary | ICD-10-CM | POA: Insufficient documentation

## 2012-03-08 DIAGNOSIS — I200 Unstable angina: Secondary | ICD-10-CM | POA: Insufficient documentation

## 2012-03-08 DIAGNOSIS — I10 Essential (primary) hypertension: Secondary | ICD-10-CM | POA: Insufficient documentation

## 2012-03-08 DIAGNOSIS — C7982 Secondary malignant neoplasm of genital organs: Secondary | ICD-10-CM | POA: Insufficient documentation

## 2012-03-08 DIAGNOSIS — Z5189 Encounter for other specified aftercare: Secondary | ICD-10-CM | POA: Insufficient documentation

## 2012-03-08 DIAGNOSIS — I4891 Unspecified atrial fibrillation: Secondary | ICD-10-CM | POA: Insufficient documentation

## 2012-03-08 DIAGNOSIS — C801 Malignant (primary) neoplasm, unspecified: Secondary | ICD-10-CM | POA: Insufficient documentation

## 2012-03-08 DIAGNOSIS — Z7902 Long term (current) use of antithrombotics/antiplatelets: Secondary | ICD-10-CM | POA: Insufficient documentation

## 2012-03-08 DIAGNOSIS — F1010 Alcohol abuse, uncomplicated: Secondary | ICD-10-CM | POA: Insufficient documentation

## 2012-03-11 ENCOUNTER — Encounter (HOSPITAL_COMMUNITY): Payer: Self-pay

## 2012-03-13 ENCOUNTER — Encounter (HOSPITAL_COMMUNITY): Payer: Self-pay

## 2012-03-15 ENCOUNTER — Encounter (HOSPITAL_COMMUNITY): Payer: Self-pay

## 2012-03-18 ENCOUNTER — Encounter (HOSPITAL_COMMUNITY): Payer: Self-pay

## 2012-03-20 ENCOUNTER — Encounter (HOSPITAL_COMMUNITY): Payer: Self-pay

## 2012-03-22 ENCOUNTER — Encounter (HOSPITAL_COMMUNITY): Payer: Self-pay

## 2012-03-22 ENCOUNTER — Telehealth: Payer: Self-pay | Admitting: *Deleted

## 2012-03-22 NOTE — Telephone Encounter (Signed)
Called pt to check & see if he received his xtandi.  He reports that he received tues & started on Wed 03/20/12.  He reports mild nausea & some apprehension but otherwise doing well with drug so far.  He also reports that Dr. Patsi Sears suggested holding off on the lupron & discussed an annual hormonal implant.  Note to Dr Cyndie Chime.

## 2012-03-25 ENCOUNTER — Encounter (HOSPITAL_COMMUNITY)
Admission: RE | Admit: 2012-03-25 | Discharge: 2012-03-25 | Disposition: A | Discharge status: Home / Self Care | Payer: Self-pay | Source: Ambulatory Visit | Attending: Cardiology | Admitting: Cardiology

## 2012-03-27 ENCOUNTER — Encounter (HOSPITAL_COMMUNITY)
Admission: RE | Admit: 2012-03-27 | Discharge: 2012-03-27 | Disposition: A | Discharge status: Home / Self Care | Payer: Self-pay | Source: Ambulatory Visit | Attending: Cardiology | Admitting: Cardiology

## 2012-03-29 ENCOUNTER — Encounter (HOSPITAL_COMMUNITY)
Admission: RE | Admit: 2012-03-29 | Discharge: 2012-03-29 | Disposition: A | Discharge status: Home / Self Care | Payer: Self-pay | Source: Ambulatory Visit | Attending: Cardiology | Admitting: Cardiology

## 2012-04-01 ENCOUNTER — Encounter (HOSPITAL_COMMUNITY)
Admission: RE | Admit: 2012-04-01 | Discharge: 2012-04-01 | Disposition: A | Discharge status: Home / Self Care | Payer: Self-pay | Source: Ambulatory Visit | Attending: Cardiology | Admitting: Cardiology

## 2012-04-03 ENCOUNTER — Encounter (HOSPITAL_COMMUNITY)
Admission: RE | Admit: 2012-04-03 | Discharge: 2012-04-03 | Disposition: A | Discharge status: Home / Self Care | Payer: Self-pay | Source: Ambulatory Visit | Attending: Cardiology | Admitting: Cardiology

## 2012-04-05 ENCOUNTER — Encounter (HOSPITAL_COMMUNITY): Payer: Self-pay

## 2012-04-08 ENCOUNTER — Encounter (HOSPITAL_COMMUNITY): Payer: Self-pay

## 2012-04-10 ENCOUNTER — Encounter (HOSPITAL_COMMUNITY)
Admission: RE | Admit: 2012-04-10 | Discharge: 2012-04-10 | Disposition: A | Discharge status: Home / Self Care | Payer: Self-pay | Source: Ambulatory Visit | Attending: Cardiology | Admitting: Cardiology

## 2012-04-10 DIAGNOSIS — Z7902 Long term (current) use of antithrombotics/antiplatelets: Secondary | ICD-10-CM | POA: Insufficient documentation

## 2012-04-10 DIAGNOSIS — C801 Malignant (primary) neoplasm, unspecified: Secondary | ICD-10-CM | POA: Insufficient documentation

## 2012-04-10 DIAGNOSIS — F1010 Alcohol abuse, uncomplicated: Secondary | ICD-10-CM | POA: Insufficient documentation

## 2012-04-10 DIAGNOSIS — I4891 Unspecified atrial fibrillation: Secondary | ICD-10-CM | POA: Insufficient documentation

## 2012-04-10 DIAGNOSIS — Z5189 Encounter for other specified aftercare: Secondary | ICD-10-CM | POA: Insufficient documentation

## 2012-04-10 DIAGNOSIS — C7982 Secondary malignant neoplasm of genital organs: Secondary | ICD-10-CM | POA: Insufficient documentation

## 2012-04-10 DIAGNOSIS — Z7982 Long term (current) use of aspirin: Secondary | ICD-10-CM | POA: Insufficient documentation

## 2012-04-10 DIAGNOSIS — I200 Unstable angina: Secondary | ICD-10-CM | POA: Insufficient documentation

## 2012-04-10 DIAGNOSIS — Z9861 Coronary angioplasty status: Secondary | ICD-10-CM | POA: Insufficient documentation

## 2012-04-10 DIAGNOSIS — I10 Essential (primary) hypertension: Secondary | ICD-10-CM | POA: Insufficient documentation

## 2012-04-12 ENCOUNTER — Encounter (HOSPITAL_COMMUNITY)
Admission: RE | Admit: 2012-04-12 | Discharge: 2012-04-12 | Disposition: A | Discharge status: Home / Self Care | Payer: Self-pay | Source: Ambulatory Visit | Attending: Cardiology | Admitting: Cardiology

## 2012-04-15 ENCOUNTER — Encounter (HOSPITAL_COMMUNITY)
Admission: RE | Admit: 2012-04-15 | Discharge: 2012-04-15 | Disposition: A | Discharge status: Home / Self Care | Payer: Self-pay | Source: Ambulatory Visit | Attending: Cardiology | Admitting: Cardiology

## 2012-04-17 ENCOUNTER — Encounter (HOSPITAL_COMMUNITY)
Admission: RE | Admit: 2012-04-17 | Discharge: 2012-04-17 | Disposition: A | Discharge status: Home / Self Care | Payer: Self-pay | Source: Ambulatory Visit | Attending: Cardiology | Admitting: Cardiology

## 2012-04-18 ENCOUNTER — Other Ambulatory Visit (HOSPITAL_BASED_OUTPATIENT_CLINIC_OR_DEPARTMENT_OTHER): Payer: 59 | Admitting: Lab

## 2012-04-18 DIAGNOSIS — C61 Malignant neoplasm of prostate: Secondary | ICD-10-CM

## 2012-04-18 DIAGNOSIS — C801 Malignant (primary) neoplasm, unspecified: Secondary | ICD-10-CM

## 2012-04-18 LAB — CBC WITH DIFFERENTIAL/PLATELET
Basophils Absolute: 0.1 10*3/uL (ref 0.0–0.1)
Eosinophils Absolute: 0.4 10*3/uL (ref 0.0–0.5)
HCT: 37.7 % — ABNORMAL LOW (ref 38.4–49.9)
HGB: 12.9 g/dL — ABNORMAL LOW (ref 13.0–17.1)
MONO#: 0.7 10*3/uL (ref 0.1–0.9)
NEUT#: 4.8 10*3/uL (ref 1.5–6.5)
NEUT%: 59.4 % (ref 39.0–75.0)
WBC: 8 10*3/uL (ref 4.0–10.3)
lymph#: 2 10*3/uL (ref 0.9–3.3)

## 2012-04-18 LAB — COMPREHENSIVE METABOLIC PANEL (CC13)
Albumin: 3.7 g/dL (ref 3.5–5.0)
BUN: 10 mg/dL (ref 7.0–26.0)
Calcium: 8.6 mg/dL (ref 8.4–10.4)
Chloride: 103 mEq/L (ref 98–107)
Glucose: 95 mg/dl (ref 70–99)
Potassium: 3.7 mEq/L (ref 3.5–5.1)
Sodium: 136 mEq/L (ref 136–145)
Total Protein: 7.5 g/dL (ref 6.4–8.3)

## 2012-04-19 ENCOUNTER — Encounter (HOSPITAL_COMMUNITY)
Admission: RE | Admit: 2012-04-19 | Discharge: 2012-04-19 | Disposition: A | Discharge status: Home / Self Care | Payer: Self-pay | Source: Ambulatory Visit | Attending: Cardiology | Admitting: Cardiology

## 2012-04-22 ENCOUNTER — Telehealth: Payer: Self-pay | Admitting: Oncology

## 2012-04-22 ENCOUNTER — Other Ambulatory Visit: Payer: 59 | Admitting: Lab

## 2012-04-22 ENCOUNTER — Ambulatory Visit (HOSPITAL_BASED_OUTPATIENT_CLINIC_OR_DEPARTMENT_OTHER): Payer: 59 | Admitting: Oncology

## 2012-04-22 ENCOUNTER — Encounter (HOSPITAL_COMMUNITY)
Admission: RE | Admit: 2012-04-22 | Discharge: 2012-04-22 | Disposition: A | Discharge status: Home / Self Care | Payer: Self-pay | Source: Ambulatory Visit | Attending: Cardiology | Admitting: Cardiology

## 2012-04-22 VITALS — BP 128/80 | HR 73 | Temp 98.8°F | Resp 20 | Ht 74.0 in | Wt 227.1 lb

## 2012-04-22 DIAGNOSIS — C7951 Secondary malignant neoplasm of bone: Secondary | ICD-10-CM

## 2012-04-22 DIAGNOSIS — I4891 Unspecified atrial fibrillation: Secondary | ICD-10-CM

## 2012-04-22 DIAGNOSIS — C61 Malignant neoplasm of prostate: Secondary | ICD-10-CM

## 2012-04-22 DIAGNOSIS — C7952 Secondary malignant neoplasm of bone marrow: Secondary | ICD-10-CM

## 2012-04-22 NOTE — Telephone Encounter (Signed)
Made and printed patient schedule. sed

## 2012-04-23 NOTE — Progress Notes (Signed)
Hematology and Oncology Follow Up Visit  Allen Schroeder [id number] 1950/10/03 61 y.o. 04/23/2012 8:20 PM   Principle Diagnosis: Encounter Diagnosis  Name Primary?  . Prostate cancer, primary, with metastasis from prostate to other site Yes     Interim History:   Short interim followup visit for this 61 year old man diagnosed with prostate cancer metastatic to bone at diagnosis in November 2009. Initial dramatic response to hormonal therapy with monthly Fergon injections along with Zometa infusions. PSA began to rise from 0.24 in June 2012 to 4.09 on 02/06/2012. Subsequent value down through his urologist's office in August about 7. I got a followup bone scan on 02/19/2012. This did not show any new areas of disease compared to a 08/02/2010 study. Chest x-ray showed a sclerotic focus on a upper thoracic vertebral body with no correlation on the bone scan.  I started him on a trial of Enzalutamide, one of the new androgen receptor blockers. He is only been on the drug for about one month. I am pleased to report a very rapid normalization of his PSA which is down to 0.87 as of September 12.  His urologist placed a subcutaneous long acting LHRH agonist, Vantas, on September 7.  Other than chronic intermittent low back pain he has no new symptoms. He remains active and is still working full-time.  Medications: reviewed  Allergies: No Known Allergies  Review of Systems: Constitutional:   No constitutional symptoms Respiratory: No cough or dyspnea Cardiovascular:  No chest pain or palpitations Gastrointestinal: No change in bowel habit Genito-Urinary: Nocturia no hematuria Musculoskeletal: See above Neurologic: No headache or change in vision Skin: No rash or ecchymosis Remaining ROS negative.  Physical Exam: Blood pressure 128/80, pulse 73, temperature 98.8 F (37.1 C), temperature source Oral, resp. rate 20, height 6\' 2"  (1.88 m), weight 227 lb 1.6 oz (103.012 kg). Wt Readings from  Last 3 Encounters:  04/22/12 227 lb 1.6 oz (103.012 kg)  02/20/12 225 lb 6 oz (102.229 kg)  02/13/12 228 lb 1.6 oz (103.465 kg)     General appearance: Tall, well-nourished, Caucasian man HENNT: Pharynx no erythema or exudate Lymph nodes: No adenopathy Breasts: Lungs: Clear to auscultation resonant to percussion Heart: Regular rhythm no murmur Abdomen: Soft nontender no mass no organomegaly Extremities: No edema no calf tenderness Vascular: No cyanosis Neurologic: Motor strength is 5 over 5, reflexes 1+ symmetric Skin: No rash or ecchymosis  Lab Results: Lab Results  Component Value Date   WBC 8.0 04/18/2012   HGB 12.9* 04/18/2012   HCT 37.7* 04/18/2012   MCV 105.1* 04/18/2012   PLT 172 04/18/2012     Chemistry      Component Value Date/Time   NA 136 04/18/2012 1504   NA 139 02/06/2012 1346   K 3.7 04/18/2012 1504   K 4.0 02/06/2012 1346   CL 103 04/18/2012 1504   CL 105 02/06/2012 1346   CO2 23 04/18/2012 1504   CO2 23 02/06/2012 1346   BUN 10.0 04/18/2012 1504   BUN 12 02/06/2012 1346   CREATININE 0.9 04/18/2012 1504   CREATININE 0.90 02/06/2012 1346      Component Value Date/Time   CALCIUM 8.6 04/18/2012 1504   CALCIUM 8.8 02/06/2012 1346   ALKPHOS 89 04/18/2012 1504   ALKPHOS 65 02/06/2012 1346   AST 62* 04/18/2012 1504   AST 57* 02/06/2012 1346   ALT 33 04/18/2012 1504   ALT 29 02/06/2012 1346   BILITOT 0.70 04/18/2012 1504   BILITOT 0.6 02/06/2012 1346  Radiological Studies: See discussion above    Impression and Plan: #1. PSA progression of known metastatic prostate cancer to bone. Rapid response to Enzalutamide. Plan continue the same. I will check a PSA in 2 months and again in 4 months with a clinical visit. He'll call for any interim clinical problems.  #2. History of acute atrial fibrillation June 2010. Currently in sinus rhythm. He remains on a beta blocker.  #3. Coronary artery disease status post bare-metal stent to the LAD August 2011 currently asymptomatic.  #4.  Ongoing alcohol abuse with no intention to stop.  #5. Hyperlipidemia   CC:. Dr. Sheryn Bison; Dr. Jethro Bolus  Levert Feinstein, MD 9/17/20138:20 PM

## 2012-04-24 ENCOUNTER — Encounter (HOSPITAL_COMMUNITY)
Admission: RE | Admit: 2012-04-24 | Discharge: 2012-04-24 | Disposition: A | Discharge status: Home / Self Care | Payer: Self-pay | Source: Ambulatory Visit | Attending: Cardiology | Admitting: Cardiology

## 2012-04-26 ENCOUNTER — Encounter (HOSPITAL_COMMUNITY): Payer: Self-pay

## 2012-04-29 ENCOUNTER — Encounter (HOSPITAL_COMMUNITY)
Admission: RE | Admit: 2012-04-29 | Discharge: 2012-04-29 | Disposition: A | Discharge status: Home / Self Care | Payer: Self-pay | Source: Ambulatory Visit | Attending: Cardiology | Admitting: Cardiology

## 2012-05-01 ENCOUNTER — Encounter (HOSPITAL_COMMUNITY)
Admission: RE | Admit: 2012-05-01 | Discharge: 2012-05-01 | Disposition: A | Discharge status: Home / Self Care | Payer: Self-pay | Source: Ambulatory Visit | Attending: Cardiology | Admitting: Cardiology

## 2012-05-03 ENCOUNTER — Encounter (HOSPITAL_COMMUNITY)
Admission: RE | Admit: 2012-05-03 | Discharge: 2012-05-03 | Disposition: A | Discharge status: Home / Self Care | Payer: Self-pay | Source: Ambulatory Visit | Attending: Cardiology | Admitting: Cardiology

## 2012-05-06 ENCOUNTER — Encounter (HOSPITAL_COMMUNITY)
Admission: RE | Admit: 2012-05-06 | Discharge: 2012-05-06 | Disposition: A | Discharge status: Home / Self Care | Payer: Self-pay | Source: Ambulatory Visit | Attending: Cardiology | Admitting: Cardiology

## 2012-05-08 ENCOUNTER — Ambulatory Visit (INDEPENDENT_AMBULATORY_CARE_PROVIDER_SITE_OTHER): Payer: 59 | Admitting: Cardiology

## 2012-05-08 ENCOUNTER — Encounter (HOSPITAL_COMMUNITY): Payer: Self-pay

## 2012-05-08 ENCOUNTER — Encounter: Payer: Self-pay | Admitting: Cardiology

## 2012-05-08 VITALS — BP 130/82 | HR 80 | Ht 74.0 in | Wt 230.0 lb

## 2012-05-08 DIAGNOSIS — I2510 Atherosclerotic heart disease of native coronary artery without angina pectoris: Secondary | ICD-10-CM

## 2012-05-08 DIAGNOSIS — Z5189 Encounter for other specified aftercare: Secondary | ICD-10-CM | POA: Insufficient documentation

## 2012-05-08 DIAGNOSIS — E785 Hyperlipidemia, unspecified: Secondary | ICD-10-CM

## 2012-05-08 DIAGNOSIS — I10 Essential (primary) hypertension: Secondary | ICD-10-CM | POA: Insufficient documentation

## 2012-05-08 DIAGNOSIS — C801 Malignant (primary) neoplasm, unspecified: Secondary | ICD-10-CM | POA: Insufficient documentation

## 2012-05-08 DIAGNOSIS — Z7982 Long term (current) use of aspirin: Secondary | ICD-10-CM | POA: Insufficient documentation

## 2012-05-08 DIAGNOSIS — Z955 Presence of coronary angioplasty implant and graft: Secondary | ICD-10-CM

## 2012-05-08 DIAGNOSIS — C7982 Secondary malignant neoplasm of genital organs: Secondary | ICD-10-CM | POA: Insufficient documentation

## 2012-05-08 DIAGNOSIS — Z7902 Long term (current) use of antithrombotics/antiplatelets: Secondary | ICD-10-CM | POA: Insufficient documentation

## 2012-05-08 DIAGNOSIS — I4891 Unspecified atrial fibrillation: Secondary | ICD-10-CM

## 2012-05-08 DIAGNOSIS — Z9861 Coronary angioplasty status: Secondary | ICD-10-CM

## 2012-05-08 DIAGNOSIS — I200 Unstable angina: Secondary | ICD-10-CM | POA: Insufficient documentation

## 2012-05-08 DIAGNOSIS — F1010 Alcohol abuse, uncomplicated: Secondary | ICD-10-CM | POA: Insufficient documentation

## 2012-05-08 MED ORDER — METOPROLOL SUCCINATE ER 25 MG PO TB24: 25.0000 mg | ORAL_TABLET | Freq: Every day | ORAL | Status: DC

## 2012-05-08 MED ORDER — CLOPIDOGREL BISULFATE 75 MG PO TABS: 75.0000 mg | ORAL_TABLET | Freq: Every day | ORAL | Status: DC

## 2012-05-08 MED ORDER — ROSUVASTATIN CALCIUM 20 MG PO TABS: 20.0000 mg | ORAL_TABLET | Freq: Every day | ORAL | Status: DC

## 2012-05-08 MED ORDER — SOTALOL HCL 120 MG PO TABS: 120.0000 mg | ORAL_TABLET | Freq: Two times a day (BID) | ORAL | Status: DC

## 2012-05-08 MED ORDER — NITROGLYCERIN 0.4 MG SL SUBL: 0.4000 mg | SUBLINGUAL_TABLET | SUBLINGUAL | Status: DC | PRN

## 2012-05-08 NOTE — Patient Instructions (Addendum)
Your physician recommends that you continue on your current medications as directed. Please refer to the Current Medication list given to you today.  Your physician wants you to follow-up in 6 months with Dr. Daleen Squibb. You will receive a reminder letter in the mail two months in advance. If you don't receive a letter, please call our office to schedule the follow-up appointment.  Your physician recommends that you return for lab work the week before Thanksgiving for fasting cholesterol

## 2012-05-08 NOTE — Assessment & Plan Note (Signed)
Continue Betapace. Return the office in 6 months.

## 2012-05-08 NOTE — Progress Notes (Signed)
HPI Allen Schroeder comes in today for evaluation and management of his coronary artery disease, history of LAD stent, and paroxysmal A. fib. He offers no complaints of angina, dyspnea on exertion or palpitations.  He ran out of his Plavix as well as his Crestor in September. He took his last Betapace this morning.  Past Medical History  Diagnosis Date  . Atrial fibrillation   . Other and unspecified hyperlipidemia   . Unspecified essential hypertension   . Malignant neoplasm of prostate 11/2007    Mets to Bone  . Elevated prostate specific antigen (PSA)   . Personal history of colonic polyps   . Hypocalcemia   . Acute alcoholic hepatitis   . Essential and other specified forms of tremor   . Anxiety state, unspecified   . Family history of malignant neoplasm of gastrointestinal tract   . Alcohol abuse with physiological dependence 02/13/2012    Current Outpatient Prescriptions  Medication Sig Dispense Refill  . CALCIUM CARB-CHOLECALCIFEROL PO Take 1 capsule by mouth daily.       . clopidogrel (PLAVIX) 75 MG tablet Take 1 tablet (75 mg total) by mouth daily.  30 tablet  11  . diphenhydrAMINE (BENADRYL) 25 MG tablet Take 25 mg by mouth every 6 (six) hours as needed.        . enzalutamide (XTANDI) 40 MG capsule Take 4 capsules (160 mg total) by mouth daily.  120 capsule  5  . folic acid (FOLVITE) 1 MG tablet Take one tablet by mouth once a day....  30 tablet  11  . metoprolol succinate (TOPROL-XL) 25 MG 24 hr tablet Take 1 tablet (25 mg total) by mouth daily.  30 tablet  11  . Multiple Vitamin (MULTIVITAMIN) tablet Take 1 tablet by mouth daily.        . nitroGLYCERIN (NITROSTAT) 0.4 MG SL tablet Place 1 tablet (0.4 mg total) under the tongue every 5 (five) minutes as needed.  25 tablet  11  . rosuvastatin (CRESTOR) 20 MG tablet Take 1 tablet (20 mg total) by mouth daily.  30 tablet  11  . zolendronic acid (ZOMETA) 4 MG/5ML injection Inject 4 mg into the vein every 3 (three) months.       .  DISCONTD: CRESTOR 20 MG tablet TAKE ONE TABLET AT BEDTIME  30 tablet  11  . DISCONTD: metoprolol succinate (TOPROL-XL) 25 MG 24 hr tablet Take 1 tablet (25 mg total) by mouth daily.  30 tablet  6  . DISCONTD: nitroGLYCERIN (NITROSTAT) 0.4 MG SL tablet Place 0.4 mg under the tongue every 5 (five) minutes as needed.        . sotalol (BETAPACE) 120 MG tablet Take 1 tablet (120 mg total) by mouth 2 (two) times daily.  60 tablet  12  . DISCONTD: sotalol (BETAPACE) 120 MG tablet TAKE ONE TABLET TWICE DAILY  60 tablet  12    No Known Allergies  Family History  Problem Relation Age of Onset  . Prostate cancer Father   . Coronary artery disease Father   . Colon cancer Father   . Aneurysm Mother     History   Social History  . Marital Status: Married    Spouse Name: N/A    Number of Children: 3  . 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 Smoking status: Former Smoker    Types: Cigarettes  . Smokeless tobacco:  Never Used   Comment: stopped in college  . Alcohol Use: Yes     has consumed as much as a 1/2 gallon of vodka in a day, now drinks at least 3-5 drinks per day.   . Drug Use: No  . Sexually Active: Not on file   Other Topics Concern  . Not on file   Social History Narrative  . No narrative on file    ROS ALL NEGATIVE EXCEPT THOSE NOTED IN HPI  PE  General Appearance: well developed, well nourished in no acute distress HEENT: symmetrical face, PERRLA, good dentition  Neck: no JVD, thyromegaly, or adenopathy, trachea midline Chest: symmetric without deformity Cardiac: PMI non-displaced, RRR, normal S1, S2, no gallop or murmur Lung: clear to ausculation and percussion Vascular: all pulses full without bruits  Abdominal: nondistended, nontender, good bowel sounds, no HSM, no bruits Extremities: no cyanosis, clubbing or edema, no sign of DVT, no varicosities  Skin: normal color, no  rashes Neuro: alert and oriented x 3, non-focal Pysch: normal affect  EKG Normal sinus rhythm, QTC borderline prolonged, no other changes. BMET    Component Value Date/Time   NA 136 04/18/2012 1504   NA 139 02/06/2012 1346   K 3.7 04/18/2012 1504   K 4.0 02/06/2012 1346   CL 103 04/18/2012 1504   CL 105 02/06/2012 1346   CO2 23 04/18/2012 1504   CO2 23 02/06/2012 1346   GLUCOSE 95 04/18/2012 1504   GLUCOSE 99 02/06/2012 1346   BUN 10.0 04/18/2012 1504   BUN 12 02/06/2012 1346   CREATININE 0.9 04/18/2012 1504   CREATININE 0.90 02/06/2012 1346   CALCIUM 8.6 04/18/2012 1504   CALCIUM 8.8 02/06/2012 1346   GFRNONAA >60 10/08/2010 0059   GFRAA  Value: >60        The eGFR has been calculated using the MDRD equation. This calculation has not been validated in all clinical situations. eGFR's persistently <60 mL/min signify possible Chronic Kidney Disease. 10/08/2010 0059    Lipid Panel     Component Value Date/Time   CHOL 200 05/11/2010 1021   TRIG 327.0* 05/11/2010 1021   HDL 61.30 05/11/2010 1021   CHOLHDL 3 05/11/2010 1021   VLDL 65.4* 05/11/2010 1021   LDLCALC  Value: 198        Total Cholesterol/HDL:CHD Risk Coronary Heart Disease Risk Table                     Men   Women  1/2 Average Risk   3.4   3.3  Average Risk       5.0   4.4  2 X Average Risk   9.6   7.1  3 X Average Risk  23.4   11.0        Use the calculated Patient Ratio above and the CHD Risk Table to determine the patient's CHD Risk.        ATP III CLASSIFICATION (LDL):  <100     mg/dL   Optimal  161-096  mg/dL   Near or Above                    Optimal  130-159  mg/dL   Borderline  045-409  mg/dL   High  >811     mg/dL   Very High* 04/07/4781 9562    CBC    Component Value Date/Time   WBC 8.0 04/18/2012 1504   WBC 5.1 10/08/2010 0059   RBC 3.59* 04/18/2012  1504   RBC 3.79* 10/08/2010 0059   HGB 12.9* 04/18/2012 1504   HGB 12.8* 10/08/2010 0059   HCT 37.7* 04/18/2012 1504   HCT 37.8* 10/08/2010 0059   PLT 172 04/18/2012 1504   PLT 159 10/08/2010 0059    MCV 105.1* 04/18/2012 1504   MCV 99.7 10/08/2010 0059   MCH 36.0* 04/18/2012 1504   MCH 33.8 10/08/2010 0059   MCHC 34.2 04/18/2012 1504   MCHC 33.9 10/08/2010 0059   RDW 15.1* 04/18/2012 1504   RDW 13.1 10/08/2010 0059   LYMPHSABS 2.0 04/18/2012 1504   LYMPHSABS 1.1 10/08/2010 0059   MONOABS 0.7 04/18/2012 1504   MONOABS 0.3 10/08/2010 0059   EOSABS 0.4 04/18/2012 1504   EOSABS 0.1 10/08/2010 0059   BASOSABS 0.1 04/18/2012 1504   BASOSABS 0.0 10/08/2010 0059

## 2012-05-08 NOTE — Assessment & Plan Note (Signed)
Stable. Continue secondary preventative therapy. Return the office in 6 months for A. fib and antiarrhythmic therapy.

## 2012-05-08 NOTE — Assessment & Plan Note (Signed)
Under good control. No change in medical therapy. 

## 2012-05-08 NOTE — Assessment & Plan Note (Signed)
Noncompliant is noted. Crestor renewed. Patient advised to never to run out of his meds again. We'll check blood work in 6 months.

## 2012-05-10 ENCOUNTER — Encounter (HOSPITAL_COMMUNITY)
Admission: RE | Admit: 2012-05-10 | Discharge: 2012-05-10 | Disposition: A | Discharge status: Home / Self Care | Payer: Self-pay | Source: Ambulatory Visit | Attending: Cardiology | Admitting: Cardiology

## 2012-05-13 ENCOUNTER — Encounter (HOSPITAL_COMMUNITY)
Admission: RE | Admit: 2012-05-13 | Discharge: 2012-05-13 | Disposition: A | Discharge status: Home / Self Care | Payer: Self-pay | Source: Ambulatory Visit | Attending: Cardiology | Admitting: Cardiology

## 2012-05-15 ENCOUNTER — Encounter (HOSPITAL_COMMUNITY)
Admission: RE | Admit: 2012-05-15 | Discharge: 2012-05-15 | Disposition: A | Discharge status: Home / Self Care | Payer: Self-pay | Source: Ambulatory Visit | Attending: Cardiology | Admitting: Cardiology

## 2012-05-17 ENCOUNTER — Encounter (HOSPITAL_COMMUNITY): Payer: Self-pay

## 2012-05-20 ENCOUNTER — Encounter (HOSPITAL_COMMUNITY): Payer: Self-pay

## 2012-05-22 ENCOUNTER — Encounter (HOSPITAL_COMMUNITY): Payer: Self-pay

## 2012-05-24 ENCOUNTER — Encounter (HOSPITAL_COMMUNITY): Payer: Self-pay

## 2012-05-27 ENCOUNTER — Encounter (HOSPITAL_COMMUNITY): Payer: Self-pay

## 2012-05-29 ENCOUNTER — Encounter (HOSPITAL_COMMUNITY): Payer: Self-pay

## 2012-05-31 ENCOUNTER — Encounter (HOSPITAL_COMMUNITY)
Admission: RE | Admit: 2012-05-31 | Discharge: 2012-05-31 | Disposition: A | Discharge status: Home / Self Care | Payer: Self-pay | Source: Ambulatory Visit | Attending: Cardiology | Admitting: Cardiology

## 2012-06-03 ENCOUNTER — Encounter (HOSPITAL_COMMUNITY)
Admission: RE | Admit: 2012-06-03 | Discharge: 2012-06-03 | Disposition: A | Discharge status: Home / Self Care | Payer: Self-pay | Source: Ambulatory Visit | Attending: Cardiology | Admitting: Cardiology

## 2012-06-05 ENCOUNTER — Encounter (HOSPITAL_COMMUNITY)
Admission: RE | Admit: 2012-06-05 | Discharge: 2012-06-05 | Disposition: A | Discharge status: Home / Self Care | Payer: Self-pay | Source: Ambulatory Visit | Attending: Cardiology | Admitting: Cardiology

## 2012-06-07 ENCOUNTER — Encounter (HOSPITAL_COMMUNITY)
Admission: RE | Admit: 2012-06-07 | Discharge: 2012-06-07 | Disposition: A | Discharge status: Home / Self Care | Payer: Self-pay | Source: Ambulatory Visit | Attending: Cardiology | Admitting: Cardiology

## 2012-06-07 DIAGNOSIS — C801 Malignant (primary) neoplasm, unspecified: Secondary | ICD-10-CM | POA: Insufficient documentation

## 2012-06-07 DIAGNOSIS — Z7982 Long term (current) use of aspirin: Secondary | ICD-10-CM | POA: Insufficient documentation

## 2012-06-07 DIAGNOSIS — I10 Essential (primary) hypertension: Secondary | ICD-10-CM | POA: Insufficient documentation

## 2012-06-07 DIAGNOSIS — I200 Unstable angina: Secondary | ICD-10-CM | POA: Insufficient documentation

## 2012-06-07 DIAGNOSIS — F1010 Alcohol abuse, uncomplicated: Secondary | ICD-10-CM | POA: Insufficient documentation

## 2012-06-07 DIAGNOSIS — Z7902 Long term (current) use of antithrombotics/antiplatelets: Secondary | ICD-10-CM | POA: Insufficient documentation

## 2012-06-07 DIAGNOSIS — C7982 Secondary malignant neoplasm of genital organs: Secondary | ICD-10-CM | POA: Insufficient documentation

## 2012-06-07 DIAGNOSIS — Z9861 Coronary angioplasty status: Secondary | ICD-10-CM | POA: Insufficient documentation

## 2012-06-07 DIAGNOSIS — I4891 Unspecified atrial fibrillation: Secondary | ICD-10-CM | POA: Insufficient documentation

## 2012-06-07 DIAGNOSIS — Z5189 Encounter for other specified aftercare: Secondary | ICD-10-CM | POA: Insufficient documentation

## 2012-06-10 ENCOUNTER — Encounter (HOSPITAL_COMMUNITY)
Admission: RE | Admit: 2012-06-10 | Discharge: 2012-06-10 | Disposition: A | Discharge status: Home / Self Care | Payer: Self-pay | Source: Ambulatory Visit | Attending: Cardiology | Admitting: Cardiology

## 2012-06-12 ENCOUNTER — Encounter (HOSPITAL_COMMUNITY)
Admission: RE | Admit: 2012-06-12 | Discharge: 2012-06-12 | Disposition: A | Discharge status: Home / Self Care | Payer: Self-pay | Source: Ambulatory Visit | Attending: Cardiology | Admitting: Cardiology

## 2012-06-14 ENCOUNTER — Encounter (HOSPITAL_COMMUNITY)
Admission: RE | Admit: 2012-06-14 | Discharge: 2012-06-14 | Disposition: A | Discharge status: Home / Self Care | Payer: Self-pay | Source: Ambulatory Visit | Attending: Cardiology | Admitting: Cardiology

## 2012-06-17 ENCOUNTER — Encounter (HOSPITAL_COMMUNITY)
Admission: RE | Admit: 2012-06-17 | Discharge: 2012-06-17 | Disposition: A | Discharge status: Home / Self Care | Payer: Self-pay | Source: Ambulatory Visit | Attending: Cardiology | Admitting: Cardiology

## 2012-06-18 ENCOUNTER — Other Ambulatory Visit: Payer: Self-pay | Admitting: *Deleted

## 2012-06-18 ENCOUNTER — Other Ambulatory Visit (HOSPITAL_BASED_OUTPATIENT_CLINIC_OR_DEPARTMENT_OTHER): Payer: 59

## 2012-06-18 DIAGNOSIS — C61 Malignant neoplasm of prostate: Secondary | ICD-10-CM

## 2012-06-18 DIAGNOSIS — C801 Malignant (primary) neoplasm, unspecified: Secondary | ICD-10-CM

## 2012-06-18 LAB — CBC WITH DIFFERENTIAL/PLATELET
BASO%: 0.8 % (ref 0.0–2.0)
Eosinophils Absolute: 0.5 10*3/uL (ref 0.0–0.5)
LYMPH%: 25.5 % (ref 14.0–49.0)
MCHC: 35.8 g/dL (ref 32.0–36.0)
MCV: 105.7 fL — ABNORMAL HIGH (ref 79.3–98.0)
MONO%: 8.6 % (ref 0.0–14.0)
NEUT#: 4.3 10*3/uL (ref 1.5–6.5)
RBC: 3.29 10*6/uL — ABNORMAL LOW (ref 4.20–5.82)
RDW: 14.5 % (ref 11.0–14.6)
WBC: 7.3 10*3/uL (ref 4.0–10.3)

## 2012-06-18 LAB — COMPREHENSIVE METABOLIC PANEL (CC13)
ALT: 25 U/L (ref 0–55)
AST: 48 U/L — ABNORMAL HIGH (ref 5–34)
Albumin: 3.7 g/dL (ref 3.5–5.0)
Alkaline Phosphatase: 74 U/L (ref 40–150)
Glucose: 92 mg/dl (ref 70–99)
Potassium: 3.6 mEq/L (ref 3.5–5.1)
Sodium: 139 mEq/L (ref 136–145)
Total Bilirubin: 0.66 mg/dL (ref 0.20–1.20)
Total Protein: 7.3 g/dL (ref 6.4–8.3)

## 2012-06-19 ENCOUNTER — Encounter (HOSPITAL_COMMUNITY): Payer: Self-pay

## 2012-06-20 ENCOUNTER — Telehealth: Payer: Self-pay

## 2012-06-20 NOTE — Telephone Encounter (Signed)
After several rounds of both patient and I leaving messages..Left message on patient's cell phone that his PSA remains normal.

## 2012-06-20 NOTE — Telephone Encounter (Signed)
Message copied by Lorine Bears on Thu Jun 20, 2012 12:25 PM ------      Message from: Rayfield Citizen R      Created: Thu Jun 20, 2012 11:49 AM                   ----- Message -----         From: Levert Feinstein, MD         Sent: 06/19/2012   5:19 PM           To: Orbie Hurst, RN, Sabino Snipes, RN, #            Call pt  PSA remains normaal at 0.18  Route copy lab to Dr Alexis Frock

## 2012-06-20 NOTE — Telephone Encounter (Signed)
Routed a copy of PSA to Dr. Patsi Sears as requested below.

## 2012-06-21 ENCOUNTER — Encounter (HOSPITAL_COMMUNITY)
Admission: RE | Admit: 2012-06-21 | Discharge: 2012-06-21 | Disposition: A | Discharge status: Home / Self Care | Payer: Self-pay | Source: Ambulatory Visit | Attending: Cardiology | Admitting: Cardiology

## 2012-06-24 ENCOUNTER — Encounter (HOSPITAL_COMMUNITY): Payer: Self-pay

## 2012-06-26 ENCOUNTER — Encounter (HOSPITAL_COMMUNITY)
Admission: RE | Admit: 2012-06-26 | Discharge: 2012-06-26 | Disposition: A | Discharge status: Home / Self Care | Payer: Self-pay | Source: Ambulatory Visit | Attending: Cardiology | Admitting: Cardiology

## 2012-06-26 ENCOUNTER — Other Ambulatory Visit: Payer: 59

## 2012-06-28 ENCOUNTER — Encounter (HOSPITAL_COMMUNITY)
Admission: RE | Admit: 2012-06-28 | Discharge: 2012-06-28 | Disposition: A | Discharge status: Home / Self Care | Payer: Self-pay | Source: Ambulatory Visit | Attending: Cardiology | Admitting: Cardiology

## 2012-07-01 ENCOUNTER — Encounter (HOSPITAL_COMMUNITY): Payer: Self-pay

## 2012-07-03 ENCOUNTER — Encounter (HOSPITAL_COMMUNITY)
Admission: RE | Admit: 2012-07-03 | Discharge: 2012-07-03 | Disposition: A | Discharge status: Home / Self Care | Payer: Self-pay | Source: Ambulatory Visit | Attending: Cardiology | Admitting: Cardiology

## 2012-07-05 ENCOUNTER — Encounter (HOSPITAL_COMMUNITY): Payer: Self-pay

## 2012-07-08 ENCOUNTER — Encounter (HOSPITAL_COMMUNITY)
Admission: RE | Admit: 2012-07-08 | Discharge: 2012-07-08 | Disposition: A | Discharge status: Home / Self Care | Payer: Self-pay | Source: Ambulatory Visit | Attending: Cardiology | Admitting: Cardiology

## 2012-07-08 DIAGNOSIS — Z7902 Long term (current) use of antithrombotics/antiplatelets: Secondary | ICD-10-CM | POA: Insufficient documentation

## 2012-07-08 DIAGNOSIS — I4891 Unspecified atrial fibrillation: Secondary | ICD-10-CM | POA: Insufficient documentation

## 2012-07-08 DIAGNOSIS — C801 Malignant (primary) neoplasm, unspecified: Secondary | ICD-10-CM | POA: Insufficient documentation

## 2012-07-08 DIAGNOSIS — I10 Essential (primary) hypertension: Secondary | ICD-10-CM | POA: Insufficient documentation

## 2012-07-08 DIAGNOSIS — F1010 Alcohol abuse, uncomplicated: Secondary | ICD-10-CM | POA: Insufficient documentation

## 2012-07-08 DIAGNOSIS — Z9861 Coronary angioplasty status: Secondary | ICD-10-CM | POA: Insufficient documentation

## 2012-07-08 DIAGNOSIS — Z5189 Encounter for other specified aftercare: Secondary | ICD-10-CM | POA: Insufficient documentation

## 2012-07-08 DIAGNOSIS — C7982 Secondary malignant neoplasm of genital organs: Secondary | ICD-10-CM | POA: Insufficient documentation

## 2012-07-08 DIAGNOSIS — I200 Unstable angina: Secondary | ICD-10-CM | POA: Insufficient documentation

## 2012-07-08 DIAGNOSIS — Z7982 Long term (current) use of aspirin: Secondary | ICD-10-CM | POA: Insufficient documentation

## 2012-07-10 ENCOUNTER — Encounter (HOSPITAL_COMMUNITY)
Admission: RE | Admit: 2012-07-10 | Discharge: 2012-07-10 | Disposition: A | Discharge status: Home / Self Care | Payer: Self-pay | Source: Ambulatory Visit | Attending: Cardiology | Admitting: Cardiology

## 2012-07-12 ENCOUNTER — Encounter (HOSPITAL_COMMUNITY)
Admission: RE | Admit: 2012-07-12 | Discharge: 2012-07-12 | Disposition: A | Discharge status: Home / Self Care | Payer: Self-pay | Source: Ambulatory Visit | Attending: Cardiology | Admitting: Cardiology

## 2012-07-15 ENCOUNTER — Encounter (HOSPITAL_COMMUNITY): Payer: Self-pay

## 2012-07-17 ENCOUNTER — Encounter (HOSPITAL_COMMUNITY)
Admission: RE | Admit: 2012-07-17 | Discharge: 2012-07-17 | Disposition: A | Discharge status: Home / Self Care | Payer: Self-pay | Source: Ambulatory Visit | Attending: Cardiology | Admitting: Cardiology

## 2012-07-19 ENCOUNTER — Encounter (HOSPITAL_COMMUNITY)
Admission: RE | Admit: 2012-07-19 | Discharge: 2012-07-19 | Disposition: A | Discharge status: Home / Self Care | Payer: Self-pay | Source: Ambulatory Visit | Attending: Cardiology | Admitting: Cardiology

## 2012-07-22 ENCOUNTER — Encounter (HOSPITAL_COMMUNITY): Payer: Self-pay

## 2012-07-24 ENCOUNTER — Encounter (HOSPITAL_COMMUNITY): Payer: Self-pay

## 2012-07-26 ENCOUNTER — Encounter (HOSPITAL_COMMUNITY): Payer: Self-pay

## 2012-07-29 ENCOUNTER — Encounter (HOSPITAL_COMMUNITY): Payer: Self-pay

## 2012-07-31 ENCOUNTER — Encounter (HOSPITAL_COMMUNITY): Payer: Self-pay

## 2012-08-02 ENCOUNTER — Encounter (HOSPITAL_COMMUNITY): Payer: Self-pay

## 2012-08-05 ENCOUNTER — Encounter (HOSPITAL_COMMUNITY): Payer: Self-pay

## 2012-08-07 ENCOUNTER — Encounter (HOSPITAL_COMMUNITY): Payer: Self-pay

## 2012-08-09 ENCOUNTER — Encounter (HOSPITAL_COMMUNITY)
Admission: RE | Admit: 2012-08-09 | Discharge: 2012-08-09 | Disposition: A | Discharge status: Home / Self Care | Payer: Self-pay | Source: Ambulatory Visit | Attending: Cardiology | Admitting: Cardiology

## 2012-08-09 DIAGNOSIS — I10 Essential (primary) hypertension: Secondary | ICD-10-CM | POA: Insufficient documentation

## 2012-08-09 DIAGNOSIS — Z7982 Long term (current) use of aspirin: Secondary | ICD-10-CM | POA: Insufficient documentation

## 2012-08-09 DIAGNOSIS — Z5189 Encounter for other specified aftercare: Secondary | ICD-10-CM | POA: Insufficient documentation

## 2012-08-09 DIAGNOSIS — C7982 Secondary malignant neoplasm of genital organs: Secondary | ICD-10-CM | POA: Insufficient documentation

## 2012-08-09 DIAGNOSIS — I4891 Unspecified atrial fibrillation: Secondary | ICD-10-CM | POA: Insufficient documentation

## 2012-08-09 DIAGNOSIS — C801 Malignant (primary) neoplasm, unspecified: Secondary | ICD-10-CM | POA: Insufficient documentation

## 2012-08-09 DIAGNOSIS — I200 Unstable angina: Secondary | ICD-10-CM | POA: Insufficient documentation

## 2012-08-09 DIAGNOSIS — Z7902 Long term (current) use of antithrombotics/antiplatelets: Secondary | ICD-10-CM | POA: Insufficient documentation

## 2012-08-09 DIAGNOSIS — F1010 Alcohol abuse, uncomplicated: Secondary | ICD-10-CM | POA: Insufficient documentation

## 2012-08-09 DIAGNOSIS — Z9861 Coronary angioplasty status: Secondary | ICD-10-CM | POA: Insufficient documentation

## 2012-08-12 ENCOUNTER — Encounter (HOSPITAL_COMMUNITY)
Admission: RE | Admit: 2012-08-12 | Discharge: 2012-08-12 | Disposition: A | Discharge status: Home / Self Care | Payer: Self-pay | Source: Ambulatory Visit | Attending: Cardiology | Admitting: Cardiology

## 2012-08-13 ENCOUNTER — Other Ambulatory Visit: Payer: Self-pay | Admitting: *Deleted

## 2012-08-13 DIAGNOSIS — C61 Malignant neoplasm of prostate: Secondary | ICD-10-CM

## 2012-08-14 ENCOUNTER — Encounter (HOSPITAL_COMMUNITY)
Admission: RE | Admit: 2012-08-14 | Discharge: 2012-08-14 | Disposition: A | Discharge status: Home / Self Care | Payer: 59 | Source: Ambulatory Visit | Attending: Oncology | Admitting: Oncology

## 2012-08-14 ENCOUNTER — Other Ambulatory Visit (HOSPITAL_BASED_OUTPATIENT_CLINIC_OR_DEPARTMENT_OTHER): Payer: 59 | Admitting: Lab

## 2012-08-14 ENCOUNTER — Encounter (HOSPITAL_COMMUNITY): Payer: Self-pay

## 2012-08-14 ENCOUNTER — Other Ambulatory Visit: Payer: Self-pay | Admitting: Oncology

## 2012-08-14 DIAGNOSIS — C7951 Secondary malignant neoplasm of bone: Secondary | ICD-10-CM

## 2012-08-14 DIAGNOSIS — C61 Malignant neoplasm of prostate: Secondary | ICD-10-CM

## 2012-08-14 DIAGNOSIS — C801 Malignant (primary) neoplasm, unspecified: Secondary | ICD-10-CM | POA: Insufficient documentation

## 2012-08-14 MED ORDER — TECHNETIUM TC 99M MEDRONATE IV KIT
25.0000 | PACK | Freq: Once | INTRAVENOUS | Status: AC | PRN
  Administered 2012-08-14: 25 via INTRAVENOUS

## 2012-08-16 ENCOUNTER — Encounter (HOSPITAL_COMMUNITY)
Admission: RE | Admit: 2012-08-16 | Discharge: 2012-08-16 | Disposition: A | Discharge status: Home / Self Care | Payer: Self-pay | Source: Ambulatory Visit | Attending: Cardiology | Admitting: Cardiology

## 2012-08-19 ENCOUNTER — Telehealth: Payer: Self-pay | Admitting: *Deleted

## 2012-08-19 ENCOUNTER — Encounter (HOSPITAL_COMMUNITY)
Admission: RE | Admit: 2012-08-19 | Discharge: 2012-08-19 | Disposition: A | Discharge status: Home / Self Care | Payer: Self-pay | Source: Ambulatory Visit | Attending: Cardiology | Admitting: Cardiology

## 2012-08-19 NOTE — Telephone Encounter (Signed)
Left message at  Home # & work # to call back for report.

## 2012-08-19 NOTE — Telephone Encounter (Signed)
Message copied by Sabino Snipes on Mon Aug 19, 2012  4:53 PM ------      Message from: Levert Feinstein      Created: Wed Aug 14, 2012  6:05 PM       Call pt - bone scan very good - healed bone lesions don't even show up.  Keep appt w Dr Reece Agar anyway. Forward cc report to Dr Patsi Sears

## 2012-08-20 ENCOUNTER — Telehealth: Payer: Self-pay | Admitting: Oncology

## 2012-08-20 ENCOUNTER — Ambulatory Visit (HOSPITAL_BASED_OUTPATIENT_CLINIC_OR_DEPARTMENT_OTHER): Payer: 59 | Admitting: Oncology

## 2012-08-20 ENCOUNTER — Telehealth: Payer: Self-pay | Admitting: *Deleted

## 2012-08-20 VITALS — BP 137/89 | HR 76 | Temp 97.7°F | Resp 18 | Ht 74.0 in | Wt 232.1 lb

## 2012-08-20 DIAGNOSIS — C61 Malignant neoplasm of prostate: Secondary | ICD-10-CM

## 2012-08-20 NOTE — Progress Notes (Signed)
Hematology and Oncology Follow Up Visit  Allen Schroeder [id number] [birth year] 62 y.o. 08/20/2012 6:18 PM   Principle Diagnosis: Encounter Diagnosis  Name Primary?  . Prostate cancer, primary, with metastasis from prostate to other site Yes     Interim History:   Followup visit for this 62 year old man with hormone sensitive prostate cancer metastatic to bone at time of initial diagnosis in November 2009. He had an initial dramatic response to hormonal therapy with monthly Fergon injections and monthly Zometa infusions. PSA fell from 72.9 in October 2009 to a nadir of 0.24 by June of 2012. PSA began to rise again and was 4.09 on 02/06/2012. Bone scan at that point did not show any obvious new lesions. He was started on Enzalutamide and once again has had a very nice response. He had a rapid fall in his PSA and within one month it was down to 0.87 by September 12. Today's value is 0.09. I did repeat a bone scan for the new year. Conclusions of the report are conflicting. It is stated that there is no bone scan evidence of skeletal metastases but in fact there are a number of areas of significant uptake but these are unchanged compared with prior studies. There has been complete resolution of some lesions particularly the one in the right SI joint. He is still getting some discomfort in that area but no significant pain. He was having some pain in his neck. This has also subsided on its own. There is uptake in the spine in the right lateral cervical spine which is unchanged. He has frequent urination but no hematuria. His cardiac status has been stable on medication and is not getting any chest pain or palpitations.  Medications: reviewed  Allergies: No Known Allergies  Review of Systems: Constitutional:  No constitutional symptoms although the Enzalutamide causes significant fatigue. I told him it would be fine to take it at night and this is working much better for him.  Respiratory: No cough  or dyspnea Cardiovascular:  See above Gastrointestinal: No change in bowel habit Genito-Urinary: See above Musculoskeletal: See above Neurologic: No headache or change in vision Skin: No rash or ecchymosis Remaining ROS negative.  Physical Exam: Blood pressure 137/89, pulse 76, temperature 97.7 F (36.5 C), temperature source Oral, resp. rate 18, height 6\' 2"  (1.88 m), weight 232 lb 1.6 oz (105.28 kg). Wt Readings from Last 3 Encounters:  08/20/12 232 lb 1.6 oz (105.28 kg)  05/08/12 230 lb (104.327 kg)  04/22/12 227 lb 1.6 oz (103.012 kg)     General appearance: Well-nourished Caucasian man HENNT: Pharynx no erythema or exudate Lymph nodes: No lymphadenopathy Breasts: Lungs: Clear to auscultation resonant to percussion Heart: Regular rhythm no murmur or gallop Abdomen: Soft, nontender, no mass, no organomegaly Extremities: No edema, no calf tenderness Vascular: No cyanosis Neurologic: Motor strength 5 over 5, reflexes 1+ symmetric Skin: No rash  Lab Results: Lab Results  Component Value Date   WBC 7.3 06/18/2012   HGB 12.4* 06/18/2012   HCT 34.8* 06/18/2012   MCV 105.7* 06/18/2012   PLT 159 06/18/2012     Chemistry      Component Value Date/Time   NA 139 06/18/2012 1357   NA 139 02/06/2012 1346   K 3.6 06/18/2012 1357   K 4.0 02/06/2012 1346   CL 106 06/18/2012 1357   CL 105 02/06/2012 1346   CO2 24 06/18/2012 1357   CO2 23 02/06/2012 1346   BUN 11.0 06/18/2012 1357   BUN  12 02/06/2012 1346   CREATININE 0.9 06/18/2012 1357   CREATININE 0.90 02/06/2012 1346      Component Value Date/Time   CALCIUM 9.3 06/18/2012 1357   CALCIUM 8.8 02/06/2012 1346   ALKPHOS 74 06/18/2012 1357   ALKPHOS 65 02/06/2012 1346   AST 48* 06/18/2012 1357   AST 57* 02/06/2012 1346   ALT 25 06/18/2012 1357   ALT 29 02/06/2012 1346   BILITOT 0.66 06/18/2012 1357   BILITOT 0.6 02/06/2012 1346       Radiological Studies: Nm Bone Scan Whole Body  08/14/2012  *RADIOLOGY REPORT*  Clinical Data:  Metastatic prostate cancer.  NUCLEAR MEDICINE WHOLE BODY BONE SCINTIGRAPHY  Technique:  Whole body anterior and posterior images were obtained approximately 3 hours after intravenous injection of radiopharmaceutical.  Radiopharmaceutical: CURIE TC-MDP TECHNETIUM TC 17M MEDRONATE IV KIT  Comparison: Whole body scan 01/20/2012  Findings: There is no new focal uptake within the axillary or appendicular skeleton to suggest bone metastasis.  Uptake within the right lateral cervical spine is unchanged.  Likewise, uptake within the mid sternum is unchanged.  Uptake within the right aspect of the lower lumbar spine is in degenerative pattern and stable.  Intense uptake within the right mid foot is unchanged.  IMPRESSION:  1.  No scintigraphic evidence of skeletal metastasis. 2.   No interval change from prior. 3.   Multiple foci of uptake most suggestive of degenerative bone disease as described above.   Original Report Authenticated By: Genevive Bi, M.D.     Impression and Plan: #1. Hormone sensitive prostate cancer. Ongoing chemical and radiographic response to Enzalutamide. Plan: Continue Enzalutamide.  #2. Atrial fibrillation diagnosed June 2010. Stable on current medication  #3. Single vessel coronary artery disease status post LAD stent August 2011  #4. Hyperlipidemia  #5. Ongoing alcohol abuse   CC:.    Levert Feinstein, MD 1/14/20146:18 PM

## 2012-08-20 NOTE — Telephone Encounter (Signed)
Spoke with patient.  Let him know bone scan was very good.  Healed lesions did not even show up.  He will keep his appt. Today with Dr. Cyndie Chime.  Copy of scan cc to Dr. Karalee Height.

## 2012-08-20 NOTE — Telephone Encounter (Signed)
Gave pt appt for lab and MD for March and MAy 2014

## 2012-08-21 ENCOUNTER — Encounter (HOSPITAL_COMMUNITY)
Admission: RE | Admit: 2012-08-21 | Discharge: 2012-08-21 | Disposition: A | Discharge status: Home / Self Care | Payer: Self-pay | Source: Ambulatory Visit | Attending: Cardiology | Admitting: Cardiology

## 2012-08-23 ENCOUNTER — Encounter (HOSPITAL_COMMUNITY)
Admission: RE | Admit: 2012-08-23 | Discharge: 2012-08-23 | Disposition: A | Discharge status: Home / Self Care | Payer: Self-pay | Source: Ambulatory Visit | Attending: Cardiovascular Disease | Admitting: Cardiovascular Disease

## 2012-08-23 ENCOUNTER — Telehealth: Payer: Self-pay | Admitting: *Deleted

## 2012-08-23 NOTE — Telephone Encounter (Signed)
Message copied by Sabino Snipes on Fri Aug 23, 2012  6:06 PM ------      Message from: Levert Feinstein      Created: Wed Aug 14, 2012  6:05 PM       Call pt - bone scan very good - healed bone lesions don't even show up.  Keep appt w Dr Reece Agar anyway. Forward cc report to Dr Patsi Sears

## 2012-08-23 NOTE — Telephone Encounter (Signed)
Dr. Cyndie Chime had already given this report to pt at MD visit.  Will route Bone Scan report to Dr. Patsi Sears.

## 2012-08-26 ENCOUNTER — Encounter (HOSPITAL_COMMUNITY): Payer: Self-pay

## 2012-08-28 ENCOUNTER — Encounter (HOSPITAL_COMMUNITY): Payer: Self-pay

## 2012-08-30 ENCOUNTER — Encounter (HOSPITAL_COMMUNITY)
Admission: RE | Admit: 2012-08-30 | Discharge: 2012-08-30 | Disposition: A | Discharge status: Home / Self Care | Payer: Self-pay | Source: Ambulatory Visit | Attending: Cardiology | Admitting: Cardiology

## 2012-09-02 ENCOUNTER — Encounter (HOSPITAL_COMMUNITY): Payer: Self-pay

## 2012-09-04 ENCOUNTER — Encounter (HOSPITAL_COMMUNITY)
Admission: RE | Admit: 2012-09-04 | Discharge: 2012-09-04 | Disposition: A | Discharge status: Home / Self Care | Payer: Self-pay | Source: Ambulatory Visit | Attending: Cardiology | Admitting: Cardiology

## 2012-09-06 ENCOUNTER — Encounter (HOSPITAL_COMMUNITY)
Admission: RE | Admit: 2012-09-06 | Discharge: 2012-09-06 | Disposition: A | Discharge status: Home / Self Care | Payer: Self-pay | Source: Ambulatory Visit | Attending: Cardiology | Admitting: Cardiology

## 2012-09-09 ENCOUNTER — Encounter (HOSPITAL_COMMUNITY)
Admission: RE | Admit: 2012-09-09 | Discharge: 2012-09-09 | Disposition: A | Discharge status: Home / Self Care | Payer: Self-pay | Source: Ambulatory Visit | Attending: Cardiology | Admitting: Cardiology

## 2012-09-09 DIAGNOSIS — I2510 Atherosclerotic heart disease of native coronary artery without angina pectoris: Secondary | ICD-10-CM | POA: Insufficient documentation

## 2012-09-09 DIAGNOSIS — E785 Hyperlipidemia, unspecified: Secondary | ICD-10-CM | POA: Insufficient documentation

## 2012-09-09 DIAGNOSIS — Z5189 Encounter for other specified aftercare: Secondary | ICD-10-CM | POA: Insufficient documentation

## 2012-09-09 DIAGNOSIS — I10 Essential (primary) hypertension: Secondary | ICD-10-CM | POA: Insufficient documentation

## 2012-09-09 DIAGNOSIS — I4891 Unspecified atrial fibrillation: Secondary | ICD-10-CM | POA: Insufficient documentation

## 2012-09-11 ENCOUNTER — Encounter (HOSPITAL_COMMUNITY): Payer: Self-pay

## 2012-09-12 ENCOUNTER — Other Ambulatory Visit: Payer: Self-pay | Admitting: Oncology

## 2012-09-13 ENCOUNTER — Encounter (HOSPITAL_COMMUNITY)
Admission: RE | Admit: 2012-09-13 | Discharge: 2012-09-13 | Disposition: A | Discharge status: Home / Self Care | Payer: Self-pay | Source: Ambulatory Visit | Attending: Cardiology | Admitting: Cardiology

## 2012-09-13 ENCOUNTER — Other Ambulatory Visit: Payer: Self-pay | Admitting: *Deleted

## 2012-09-16 ENCOUNTER — Encounter (HOSPITAL_COMMUNITY): Payer: Self-pay

## 2012-09-18 ENCOUNTER — Encounter (HOSPITAL_COMMUNITY): Payer: Self-pay

## 2012-09-20 ENCOUNTER — Encounter (HOSPITAL_COMMUNITY): Payer: Self-pay

## 2012-09-23 ENCOUNTER — Encounter (HOSPITAL_COMMUNITY)
Admission: RE | Admit: 2012-09-23 | Discharge: 2012-09-23 | Disposition: A | Discharge status: Home / Self Care | Payer: Self-pay | Source: Ambulatory Visit | Attending: Cardiology | Admitting: Cardiology

## 2012-09-25 ENCOUNTER — Encounter (HOSPITAL_COMMUNITY)
Admission: RE | Admit: 2012-09-25 | Discharge: 2012-09-25 | Disposition: A | Discharge status: Home / Self Care | Payer: Self-pay | Source: Ambulatory Visit | Attending: Cardiology | Admitting: Cardiology

## 2012-09-27 ENCOUNTER — Encounter (HOSPITAL_COMMUNITY)
Admission: RE | Admit: 2012-09-27 | Discharge: 2012-09-27 | Disposition: A | Discharge status: Home / Self Care | Payer: Self-pay | Source: Ambulatory Visit | Attending: Cardiology | Admitting: Cardiology

## 2012-09-30 ENCOUNTER — Encounter (HOSPITAL_COMMUNITY)
Admission: RE | Admit: 2012-09-30 | Discharge: 2012-09-30 | Disposition: A | Discharge status: Home / Self Care | Payer: Self-pay | Source: Ambulatory Visit | Attending: Cardiology | Admitting: Cardiology

## 2012-10-02 ENCOUNTER — Encounter (HOSPITAL_COMMUNITY): Payer: Self-pay

## 2012-10-04 ENCOUNTER — Encounter (HOSPITAL_COMMUNITY)
Admission: RE | Admit: 2012-10-04 | Discharge: 2012-10-04 | Disposition: A | Discharge status: Home / Self Care | Payer: Self-pay | Source: Ambulatory Visit | Attending: Cardiology | Admitting: Cardiology

## 2012-10-07 ENCOUNTER — Encounter (HOSPITAL_COMMUNITY): Payer: Self-pay

## 2012-10-07 DIAGNOSIS — I723 Aneurysm of iliac artery: Secondary | ICD-10-CM | POA: Insufficient documentation

## 2012-10-07 DIAGNOSIS — I700 Atherosclerosis of aorta: Secondary | ICD-10-CM | POA: Insufficient documentation

## 2012-10-07 DIAGNOSIS — Z5189 Encounter for other specified aftercare: Secondary | ICD-10-CM | POA: Insufficient documentation

## 2012-10-07 DIAGNOSIS — Q619 Cystic kidney disease, unspecified: Secondary | ICD-10-CM | POA: Insufficient documentation

## 2012-10-07 DIAGNOSIS — I200 Unstable angina: Secondary | ICD-10-CM | POA: Insufficient documentation

## 2012-10-07 DIAGNOSIS — C61 Malignant neoplasm of prostate: Secondary | ICD-10-CM | POA: Insufficient documentation

## 2012-10-07 DIAGNOSIS — C7951 Secondary malignant neoplasm of bone: Secondary | ICD-10-CM | POA: Insufficient documentation

## 2012-10-07 DIAGNOSIS — Z9089 Acquired absence of other organs: Secondary | ICD-10-CM | POA: Insufficient documentation

## 2012-10-07 DIAGNOSIS — I4891 Unspecified atrial fibrillation: Secondary | ICD-10-CM | POA: Insufficient documentation

## 2012-10-07 DIAGNOSIS — K7689 Other specified diseases of liver: Secondary | ICD-10-CM | POA: Insufficient documentation

## 2012-10-09 ENCOUNTER — Encounter (HOSPITAL_COMMUNITY)
Admission: RE | Admit: 2012-10-09 | Discharge: 2012-10-09 | Disposition: A | Discharge status: Home / Self Care | Payer: Self-pay | Source: Ambulatory Visit | Attending: Cardiology | Admitting: Cardiology

## 2012-10-11 ENCOUNTER — Encounter (HOSPITAL_COMMUNITY): Payer: Self-pay

## 2012-10-14 ENCOUNTER — Encounter (HOSPITAL_COMMUNITY)
Admission: RE | Admit: 2012-10-14 | Discharge: 2012-10-14 | Disposition: A | Discharge status: Home / Self Care | Payer: Self-pay | Source: Ambulatory Visit | Attending: Cardiology | Admitting: Cardiology

## 2012-10-15 ENCOUNTER — Other Ambulatory Visit (HOSPITAL_BASED_OUTPATIENT_CLINIC_OR_DEPARTMENT_OTHER): Payer: 59

## 2012-10-15 DIAGNOSIS — C61 Malignant neoplasm of prostate: Secondary | ICD-10-CM

## 2012-10-15 DIAGNOSIS — C7952 Secondary malignant neoplasm of bone marrow: Secondary | ICD-10-CM

## 2012-10-15 DIAGNOSIS — C7951 Secondary malignant neoplasm of bone: Secondary | ICD-10-CM

## 2012-10-15 LAB — COMPREHENSIVE METABOLIC PANEL (CC13)
Alkaline Phosphatase: 100 U/L (ref 40–150)
BUN: 12.8 mg/dL (ref 7.0–26.0)
Creatinine: 0.9 mg/dL (ref 0.7–1.3)
Glucose: 103 mg/dl — ABNORMAL HIGH (ref 70–99)
Sodium: 136 mEq/L (ref 136–145)
Total Bilirubin: 0.95 mg/dL (ref 0.20–1.20)

## 2012-10-15 LAB — CBC WITH DIFFERENTIAL/PLATELET
Basophils Absolute: 0.1 10*3/uL (ref 0.0–0.1)
EOS%: 6.6 % (ref 0.0–7.0)
HGB: 12.5 g/dL — ABNORMAL LOW (ref 13.0–17.1)
LYMPH%: 24.3 % (ref 14.0–49.0)
MCH: 37 pg — ABNORMAL HIGH (ref 27.2–33.4)
MCV: 106.7 fL — ABNORMAL HIGH (ref 79.3–98.0)
MONO%: 7.5 % (ref 0.0–14.0)
NEUT%: 60.7 % (ref 39.0–75.0)
RDW: 14.2 % (ref 11.0–14.6)

## 2012-10-16 ENCOUNTER — Encounter (HOSPITAL_COMMUNITY): Payer: Self-pay

## 2012-10-17 ENCOUNTER — Telehealth: Payer: Self-pay | Admitting: *Deleted

## 2012-10-17 NOTE — Telephone Encounter (Signed)
Pt notified of good PSA result & he was very happy.

## 2012-10-17 NOTE — Telephone Encounter (Signed)
Message copied by Sabino Snipes on Thu Oct 17, 2012  2:31 PM ------      Message from: Levert Feinstein      Created: Wed Oct 16, 2012  7:26 AM       Call pt PSA remains undetectable ------

## 2012-10-18 ENCOUNTER — Encounter (HOSPITAL_COMMUNITY)
Admission: RE | Admit: 2012-10-18 | Discharge: 2012-10-18 | Disposition: A | Discharge status: Home / Self Care | Payer: Self-pay | Source: Ambulatory Visit | Attending: Cardiology | Admitting: Cardiology

## 2012-10-21 ENCOUNTER — Encounter (HOSPITAL_COMMUNITY): Payer: Self-pay

## 2012-10-23 ENCOUNTER — Encounter (HOSPITAL_COMMUNITY): Payer: Self-pay

## 2012-10-25 ENCOUNTER — Encounter (HOSPITAL_COMMUNITY)
Admission: RE | Admit: 2012-10-25 | Discharge: 2012-10-25 | Disposition: A | Discharge status: Home / Self Care | Payer: Self-pay | Source: Ambulatory Visit | Attending: Cardiology | Admitting: Cardiology

## 2012-10-28 ENCOUNTER — Encounter (HOSPITAL_COMMUNITY)
Admission: RE | Admit: 2012-10-28 | Discharge: 2012-10-28 | Disposition: A | Discharge status: Home / Self Care | Payer: Self-pay | Source: Ambulatory Visit | Attending: Cardiology | Admitting: Cardiology

## 2012-10-30 ENCOUNTER — Encounter (HOSPITAL_COMMUNITY)
Admission: RE | Admit: 2012-10-30 | Discharge: 2012-10-30 | Disposition: A | Discharge status: Home / Self Care | Payer: Self-pay | Source: Ambulatory Visit | Attending: Cardiology | Admitting: Cardiology

## 2012-11-01 ENCOUNTER — Telehealth: Payer: Self-pay | Admitting: *Deleted

## 2012-11-01 ENCOUNTER — Encounter (HOSPITAL_COMMUNITY)
Admission: RE | Admit: 2012-11-01 | Discharge: 2012-11-01 | Disposition: A | Discharge status: Home / Self Care | Payer: Self-pay | Source: Ambulatory Visit | Attending: Cardiology | Admitting: Cardiology

## 2012-11-01 NOTE — Telephone Encounter (Signed)
LMOVM pt overdue for fasting cholesterol labs. Left message for pt to come to office Monday morning for lab draw & has afternoon ov with Dr. Daleen Squibb. Mylo Red RN

## 2012-11-04 ENCOUNTER — Ambulatory Visit (INDEPENDENT_AMBULATORY_CARE_PROVIDER_SITE_OTHER): Payer: 59 | Admitting: Cardiology

## 2012-11-04 ENCOUNTER — Encounter: Payer: Self-pay | Admitting: Cardiology

## 2012-11-04 ENCOUNTER — Encounter (HOSPITAL_COMMUNITY): Payer: Self-pay

## 2012-11-04 ENCOUNTER — Ambulatory Visit (INDEPENDENT_AMBULATORY_CARE_PROVIDER_SITE_OTHER): Payer: 59 | Admitting: *Deleted

## 2012-11-04 VITALS — BP 124/76 | HR 74 | Ht 74.0 in | Wt 229.0 lb

## 2012-11-04 DIAGNOSIS — Z9861 Coronary angioplasty status: Secondary | ICD-10-CM

## 2012-11-04 DIAGNOSIS — E785 Hyperlipidemia, unspecified: Secondary | ICD-10-CM

## 2012-11-04 DIAGNOSIS — I2510 Atherosclerotic heart disease of native coronary artery without angina pectoris: Secondary | ICD-10-CM

## 2012-11-04 DIAGNOSIS — I10 Essential (primary) hypertension: Secondary | ICD-10-CM

## 2012-11-04 DIAGNOSIS — Z955 Presence of coronary angioplasty implant and graft: Secondary | ICD-10-CM

## 2012-11-04 DIAGNOSIS — I4891 Unspecified atrial fibrillation: Secondary | ICD-10-CM

## 2012-11-04 LAB — LIPID PANEL
HDL: 54.6 mg/dL (ref >39.00)
LDL Cholesterol: 61 mg/dL (ref 0–99)
Total CHOL/HDL Ratio: 3
Triglycerides: 200 mg/dL — ABNORMAL HIGH (ref 0.0–149.0)
VLDL: 40 mg/dL (ref 0.0–40.0)

## 2012-11-04 LAB — HEPATIC FUNCTION PANEL: Albumin: 3.8 g/dL (ref 3.5–5.2)

## 2012-11-04 NOTE — Progress Notes (Signed)
HPI Allen Schroeder comes in today for evaluation and management of his history of paroxysmal A. fib with a rapid ventricular rate, well-controlled on sotolol, history of coronary artery disease status post LAD stenting, hypertension, mixed hyperlipidemia.  He still works every day. He has a lot of back pain from his malignant prostate cancer. He still drinks moderately on a daily basis to cope. He denies any angina or chest pain. His lipids were drawn today the best they've been in years. I reviewed the results with him. He denies any symptoms of angina or ischemic heart disease. He has had no symptoms of A. fib. He denies any bleeding.  Past Medical History  Diagnosis Date  . Atrial fibrillation   . Other and unspecified hyperlipidemia   . Unspecified essential hypertension   . Malignant neoplasm of prostate 11/2007    Mets to Bone  . Elevated prostate specific antigen (PSA)   . Personal history of colonic polyps   . Hypocalcemia   . Acute alcoholic hepatitis   . Essential and other specified forms of tremor   . Anxiety state, unspecified   . Family history of malignant neoplasm of gastrointestinal tract   . Alcohol abuse with physiological dependence 02/13/2012    Current Outpatient Prescriptions  Medication Sig Dispense Refill  . CALCIUM CARB-CHOLECALCIFEROL PO Take 1 capsule by mouth daily.       . clopidogrel (PLAVIX) 75 MG tablet Take 1 tablet (75 mg total) by mouth daily.  30 tablet  11  . diphenhydrAMINE (BENADRYL) 25 MG tablet Take 25 mg by mouth every 6 (six) hours as needed.        . folic acid (FOLVITE) 1 MG tablet Take one tablet by mouth once a day....  30 tablet  11  . metoprolol succinate (TOPROL-XL) 25 MG 24 hr tablet Take 1 tablet (25 mg total) by mouth daily.  30 tablet  11  . Multiple Vitamin (MULTIVITAMIN) tablet Take 1 tablet by mouth daily.        . nitroGLYCERIN (NITROSTAT) 0.4 MG SL tablet Place 1 tablet (0.4 mg total) under the tongue every 5 (five) minutes as needed.  25  tablet  11  . rosuvastatin (CRESTOR) 20 MG tablet Take 1 tablet (20 mg total) by mouth daily.  30 tablet  11  . sotalol (BETAPACE) 120 MG tablet Take 1 tablet (120 mg total) by mouth 2 (two) times daily.  60 tablet  12  . UNKNOWN TO PATIENT Slow release hormone implant placed 8/13 by Dr. Karalee Height.  Good for one year      . XTANDI 40 MG capsule Take 4 capsules by mouth  every morning  120 capsule  6  . zolendronic acid (ZOMETA) 4 MG/5ML injection Inject 4 mg into the vein every 3 (three) months.        No current facility-administered medications for this visit.    No Known Allergies  Family History  Problem Relation Age of Onset  . Prostate cancer Father   . Coronary artery disease Father   . Colon cancer Father   . Aneurysm Mother     History   Social History  . Marital Status: Married    Spouse Name: N/A    Number of Children: 3  . 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 Smoking status: Former Smoker    Types: Cigarettes  . Smokeless tobacco:  Never Used     Comment: stopped in college  . Alcohol Use: Yes     Comment: has consumed as much as a 1/2 gallon of vodka in a day, now drinks at least 3-5 drinks per day.   . Drug Use: No  . Sexually Active: Not on file   Other Topics Concern  . Not on file   Social History Narrative  . No narrative on file    ROS ALL NEGATIVE EXCEPT THOSE NOTED IN HPI  PE  General Appearance: well developed, well nourished in no acute distress, looks older than stated age HEENT: symmetrical face, PERRLA, good dentition  Neck: no JVD, thyromegaly, or adenopathy, trachea midline Chest: symmetric without deformity Cardiac: PMI non-displaced, RRR, normal S1, S2, no gallop or murmur Lung: clear to ausculation and percussion Vascular: all pulses full without bruits  Abdominal: nondistended, nontender, good bowel sounds, no HSM, no bruits Extremities:  no cyanosis, clubbing or edema, no sign of DVT, no varicosities  Skin: normal color, no rashes Neuro: alert and oriented x 3, non-focal Pysch: normal affect  EKG Normal sinus rhythm, nonspecific ST segment changes, normal PR QRS and QTc interval.  BMET    Component Value Date/Time   NA 136 10/15/2012 1547   NA 139 02/06/2012 1346   K 4.0 10/15/2012 1547   K 4.0 02/06/2012 1346   CL 101 10/15/2012 1547   CL 105 02/06/2012 1346   CO2 25 10/15/2012 1547   CO2 23 02/06/2012 1346   GLUCOSE 103* 10/15/2012 1547   GLUCOSE 99 02/06/2012 1346   BUN 12.8 10/15/2012 1547   BUN 12 02/06/2012 1346   CREATININE 0.9 10/15/2012 1547   CREATININE 0.90 02/06/2012 1346   CALCIUM 9.2 10/15/2012 1547   CALCIUM 8.8 02/06/2012 1346   GFRNONAA >60 10/08/2010 0059   GFRAA  Value: >60        The eGFR has been calculated using the MDRD equation. This calculation has not been validated in all clinical situations. eGFR's persistently <60 mL/min signify possible Chronic Kidney Disease. 10/08/2010 0059    Lipid Panel     Component Value Date/Time   CHOL 156 11/04/2012 0807   TRIG 200.0* 11/04/2012 0807   HDL 54.60 11/04/2012 0807   CHOLHDL 3 11/04/2012 0807   VLDL 40.0 11/04/2012 0807   LDLCALC 61 11/04/2012 0807    CBC    Component Value Date/Time   WBC 7.9 10/15/2012 1547   WBC 5.1 10/08/2010 0059   RBC 3.38* 10/15/2012 1547   RBC 3.79* 10/08/2010 0059   HGB 12.5* 10/15/2012 1547   HGB 12.8* 10/08/2010 0059   HCT 36.0* 10/15/2012 1547   HCT 37.8* 10/08/2010 0059   PLT 157 10/15/2012 1547   PLT 159 10/08/2010 0059   MCV 106.7* 10/15/2012 1547   MCV 99.7 10/08/2010 0059   MCH 37.0* 10/15/2012 1547   MCH 33.8 10/08/2010 0059   MCHC 34.7 10/15/2012 1547   MCHC 33.9 10/08/2010 0059   RDW 14.2 10/15/2012 1547   RDW 13.1 10/08/2010 0059   LYMPHSABS 1.9 10/15/2012 1547   LYMPHSABS 1.1 10/08/2010 0059   MONOABS 0.6 10/15/2012 1547   MONOABS 0.3 10/08/2010 0059   EOSABS 0.5 10/15/2012 1547   EOSABS 0.1 10/08/2010 0059   BASOSABS 0.1 10/15/2012 1547    BASOSABS 0.0 10/08/2010 0059

## 2012-11-04 NOTE — Assessment & Plan Note (Signed)
Maintaining sinus rhythm on sotalol. He is not a Coumadin candidate. He is also on low-dose metoprolol for hypertension and his coronary disease. Is also felt to help prevent any esophageal bleeding.

## 2012-11-04 NOTE — Assessment & Plan Note (Signed)
Stable. Numbers reviewed. Continue with Crestor. Repeat labs in one year.

## 2012-11-04 NOTE — Assessment & Plan Note (Signed)
Stable. Continue secondary preventative therapy. Return to see Dr. Jens Som in 6 months.

## 2012-11-04 NOTE — Patient Instructions (Signed)
**Note De-identified Allen Schroeder Obfuscation** Your physician recommends that you continue on your current medications as directed. Please refer to the Current Medication list given to you today.  Your physician wants you to follow-up in: 6 months. You will receive a reminder letter in the mail two months in advance. If you don't receive a letter, please call our office to schedule the follow-up appointment.  

## 2012-11-04 NOTE — Assessment & Plan Note (Signed)
Stable.  No change in medication. 

## 2012-11-06 ENCOUNTER — Encounter (HOSPITAL_COMMUNITY): Payer: Self-pay

## 2012-11-06 DIAGNOSIS — Z9861 Coronary angioplasty status: Secondary | ICD-10-CM | POA: Insufficient documentation

## 2012-11-06 DIAGNOSIS — I200 Unstable angina: Secondary | ICD-10-CM | POA: Insufficient documentation

## 2012-11-06 DIAGNOSIS — Z5189 Encounter for other specified aftercare: Secondary | ICD-10-CM | POA: Insufficient documentation

## 2012-11-06 DIAGNOSIS — I4891 Unspecified atrial fibrillation: Secondary | ICD-10-CM | POA: Insufficient documentation

## 2012-11-06 DIAGNOSIS — Z7902 Long term (current) use of antithrombotics/antiplatelets: Secondary | ICD-10-CM | POA: Insufficient documentation

## 2012-11-06 DIAGNOSIS — I10 Essential (primary) hypertension: Secondary | ICD-10-CM | POA: Insufficient documentation

## 2012-11-08 ENCOUNTER — Encounter (HOSPITAL_COMMUNITY)
Admission: RE | Admit: 2012-11-08 | Discharge: 2012-11-08 | Disposition: A | Discharge status: Home / Self Care | Payer: Self-pay | Source: Ambulatory Visit | Attending: Cardiology | Admitting: Cardiology

## 2012-11-11 ENCOUNTER — Encounter (HOSPITAL_COMMUNITY)
Admission: RE | Admit: 2012-11-11 | Discharge: 2012-11-11 | Disposition: A | Discharge status: Home / Self Care | Payer: Self-pay | Source: Ambulatory Visit | Attending: Cardiology | Admitting: Cardiology

## 2012-11-13 ENCOUNTER — Encounter (HOSPITAL_COMMUNITY): Payer: Self-pay

## 2012-11-15 ENCOUNTER — Encounter (HOSPITAL_COMMUNITY)
Admission: RE | Admit: 2012-11-15 | Discharge: 2012-11-15 | Disposition: A | Discharge status: Home / Self Care | Payer: Self-pay | Source: Ambulatory Visit | Attending: Cardiology | Admitting: Cardiology

## 2012-11-18 ENCOUNTER — Encounter (HOSPITAL_COMMUNITY): Payer: Self-pay

## 2012-11-20 ENCOUNTER — Encounter (HOSPITAL_COMMUNITY): Payer: Self-pay

## 2012-11-22 ENCOUNTER — Encounter (HOSPITAL_COMMUNITY): Payer: Self-pay

## 2012-11-25 ENCOUNTER — Encounter (HOSPITAL_COMMUNITY): Payer: Self-pay

## 2012-11-27 ENCOUNTER — Encounter (HOSPITAL_COMMUNITY): Payer: Self-pay

## 2012-11-29 ENCOUNTER — Encounter (HOSPITAL_COMMUNITY): Payer: Self-pay

## 2012-12-02 ENCOUNTER — Encounter (HOSPITAL_COMMUNITY)
Admission: RE | Admit: 2012-12-02 | Discharge: 2012-12-02 | Disposition: A | Discharge status: Home / Self Care | Payer: Self-pay | Source: Ambulatory Visit | Attending: Cardiology | Admitting: Cardiology

## 2012-12-04 ENCOUNTER — Encounter (HOSPITAL_COMMUNITY)
Admission: RE | Admit: 2012-12-04 | Discharge: 2012-12-04 | Disposition: A | Discharge status: Home / Self Care | Payer: Self-pay | Source: Ambulatory Visit | Attending: Cardiology | Admitting: Cardiology

## 2012-12-06 ENCOUNTER — Encounter (HOSPITAL_COMMUNITY): Payer: Self-pay

## 2012-12-06 DIAGNOSIS — Z7902 Long term (current) use of antithrombotics/antiplatelets: Secondary | ICD-10-CM | POA: Insufficient documentation

## 2012-12-06 DIAGNOSIS — I200 Unstable angina: Secondary | ICD-10-CM | POA: Insufficient documentation

## 2012-12-06 DIAGNOSIS — I4891 Unspecified atrial fibrillation: Secondary | ICD-10-CM | POA: Insufficient documentation

## 2012-12-06 DIAGNOSIS — Z9861 Coronary angioplasty status: Secondary | ICD-10-CM | POA: Insufficient documentation

## 2012-12-06 DIAGNOSIS — I10 Essential (primary) hypertension: Secondary | ICD-10-CM | POA: Insufficient documentation

## 2012-12-06 DIAGNOSIS — Z5189 Encounter for other specified aftercare: Secondary | ICD-10-CM | POA: Insufficient documentation

## 2012-12-09 ENCOUNTER — Encounter (HOSPITAL_COMMUNITY): Payer: Self-pay

## 2012-12-09 ENCOUNTER — Other Ambulatory Visit: Payer: 59 | Admitting: Lab

## 2012-12-10 ENCOUNTER — Other Ambulatory Visit (HOSPITAL_BASED_OUTPATIENT_CLINIC_OR_DEPARTMENT_OTHER): Payer: 59 | Admitting: Lab

## 2012-12-10 DIAGNOSIS — C61 Malignant neoplasm of prostate: Secondary | ICD-10-CM

## 2012-12-10 DIAGNOSIS — C801 Malignant (primary) neoplasm, unspecified: Secondary | ICD-10-CM

## 2012-12-10 LAB — COMPREHENSIVE METABOLIC PANEL (CC13)
AST: 93 U/L — ABNORMAL HIGH (ref 5–34)
Albumin: 3.4 g/dL — ABNORMAL LOW (ref 3.5–5.0)
BUN: 7.3 mg/dL (ref 7.0–26.0)
Calcium: 8.7 mg/dL (ref 8.4–10.4)
Chloride: 100 mEq/L (ref 98–107)
Potassium: 3.7 mEq/L (ref 3.5–5.1)
Sodium: 136 mEq/L (ref 136–145)
Total Protein: 7.8 g/dL (ref 6.4–8.3)

## 2012-12-10 LAB — CBC WITH DIFFERENTIAL/PLATELET
BASO%: 0.9 % (ref 0.0–2.0)
Basophils Absolute: 0.1 10*3/uL (ref 0.0–0.1)
HCT: 35.7 % — ABNORMAL LOW (ref 38.4–49.9)
HGB: 12.4 g/dL — ABNORMAL LOW (ref 13.0–17.1)
MCHC: 34.8 g/dL (ref 32.0–36.0)
MONO#: 0.5 10*3/uL (ref 0.1–0.9)
NEUT%: 60.4 % (ref 39.0–75.0)
RDW: 13.9 % (ref 11.0–14.6)
WBC: 7.1 10*3/uL (ref 4.0–10.3)
lymph#: 1.8 10*3/uL (ref 0.9–3.3)

## 2012-12-11 ENCOUNTER — Encounter (HOSPITAL_COMMUNITY)
Admission: RE | Admit: 2012-12-11 | Discharge: 2012-12-11 | Disposition: A | Discharge status: Home / Self Care | Payer: Self-pay | Source: Ambulatory Visit | Attending: Cardiology | Admitting: Cardiology

## 2012-12-13 ENCOUNTER — Encounter (HOSPITAL_COMMUNITY)
Admission: RE | Admit: 2012-12-13 | Discharge: 2012-12-13 | Disposition: A | Discharge status: Home / Self Care | Payer: Self-pay | Source: Ambulatory Visit | Attending: Cardiology | Admitting: Cardiology

## 2012-12-16 ENCOUNTER — Encounter (HOSPITAL_COMMUNITY): Payer: Self-pay

## 2012-12-16 ENCOUNTER — Ambulatory Visit (HOSPITAL_BASED_OUTPATIENT_CLINIC_OR_DEPARTMENT_OTHER): Payer: 59 | Admitting: Oncology

## 2012-12-16 VITALS — BP 127/83 | HR 92 | Temp 97.6°F | Resp 18 | Ht 74.0 in | Wt 227.6 lb

## 2012-12-16 DIAGNOSIS — C7952 Secondary malignant neoplasm of bone marrow: Secondary | ICD-10-CM

## 2012-12-16 DIAGNOSIS — C61 Malignant neoplasm of prostate: Secondary | ICD-10-CM

## 2012-12-16 DIAGNOSIS — F1010 Alcohol abuse, uncomplicated: Secondary | ICD-10-CM

## 2012-12-16 DIAGNOSIS — I4891 Unspecified atrial fibrillation: Secondary | ICD-10-CM

## 2012-12-16 DIAGNOSIS — C7951 Secondary malignant neoplasm of bone: Secondary | ICD-10-CM

## 2012-12-16 NOTE — Progress Notes (Signed)
Hematology and Oncology Follow Up Visit  Allen Schroeder [id number] 01-08-1951 62 y.o. 12/16/2012 8:49 PM   Principle Diagnosis: Encounter Diagnosis  Name Primary?  . Prostate cancer, primary, with metastasis from prostate to other site Yes     Interim History:   Followup visit for this 62 year old man with prostate cancer metastatic to bone at diagnosis in November 2009. He had an initial dramatic response to hormonal therapy with monthly Fergon injections and monthly Zometa infusions. PSA fell from 72.9 in October 2009 to a nadir of 0.24 by June of 2012. PSA began to rise again and was 4.09 on 02/06/2012. Bone scan at that point did not show any obvious new lesions. He was started on Enzalutamide and  had a very nice response. He had a rapid fall in his PSA and within one month it was down to 0.87 by September 12 with subsequent fall to undetectable levels. PSA done in anticipation of today's visit on May 6th is 0.05. He had an implanted subcutaneous [place] LHRH agonist placed on his left forearm.  He gets occasional pain in his low back which is intermittent. He has had no new areas of pain or persistent pain.   Medications: reviewed  Allergies: No Known Allergies  Review of Systems: Constitutional:   No constitutional symptoms Respiratory: No cough or dyspnea Cardiovascular:  No chest pain or palpitations Gastrointestinal: No change in bowel habit Genito-Urinary: No change in chronic nocturia Musculoskeletal: See above Neurologic: No headache or change in vision Skin: No rash or ecchymosis Remaining ROS negative.  Physical Exam: Blood pressure 127/83, pulse 92, temperature 97.6 F (36.4 C), temperature source Oral, resp. rate 18, height 6\' 2"  (1.88 m), weight 227 lb 9.6 oz (103.239 kg), SpO2 97.00%. Wt Readings from Last 3 Encounters:  12/16/12 227 lb 9.6 oz (103.239 kg)  11/04/12 229 lb (103.874 kg)  08/20/12 232 lb 1.6 oz (105.28 kg)     General appearance:  Well-nourished Caucasian man HENNT: Pharynx no erythema or exudate Lymph nodes: No adenopathy Breasts: Lungs: Clear to auscultation resonant to percussion Heart: Regular rhythm no murmur Abdomen: Soft, nontender, no mass, question hepatomegaly. Extremities: No edema, no calf tenderness Musculoskeletal: GU: Vascular: No cyanosis Neurologic: Motor strength 5 over 5, reflexes 1+ symmetric Skin: No rash or ecchymosis  Lab Results: Lab Results  Component Value Date   WBC 7.1 12/10/2012   HGB 12.4* 12/10/2012   HCT 35.7* 12/10/2012   MCV 107.4* 12/10/2012   PLT 148 12/10/2012     Chemistry      Component Value Date/Time   NA 136 12/10/2012 1540   NA 139 02/06/2012 1346   K 3.7 12/10/2012 1540   K 4.0 02/06/2012 1346   CL 100 12/10/2012 1540   CL 105 02/06/2012 1346   CO2 27 12/10/2012 1540   CO2 23 02/06/2012 1346   BUN 7.3 12/10/2012 1540   BUN 12 02/06/2012 1346   CREATININE 0.8 12/10/2012 1540   CREATININE 0.90 02/06/2012 1346      Component Value Date/Time   CALCIUM 8.7 12/10/2012 1540   CALCIUM 8.8 02/06/2012 1346   ALKPHOS 99 12/10/2012 1540   ALKPHOS 95 11/04/2012 0807   AST 93* 12/10/2012 1540   AST 93* 11/04/2012 0807   ALT 37 12/10/2012 1540   ALT 37 11/04/2012 0807   BILITOT 0.93 12/10/2012 1540   BILITOT 0.9 11/04/2012 7829       Radiological Studies: No results found.  Impression: #1. Hormone sensitive prostate cancer.  Ongoing PSA  and radiographic response to Enzalutamide. Plan: Continue Enzalutamide.   #2. Atrial fibrillation diagnosed June 2010.  Stable on current medication   #3. Single vessel coronary artery disease status post LAD stent August 2011   #4. Hyperlipidemia   #5. Ongoing alcohol abuse      CC:.    Levert Feinstein, MD 5/12/20148:49 PM

## 2012-12-17 ENCOUNTER — Telehealth: Payer: Self-pay | Admitting: Oncology

## 2012-12-17 NOTE — Telephone Encounter (Signed)
[facility] ADVISING THE PT OF HIS July AND SEPT 2014 APPTS

## 2012-12-18 ENCOUNTER — Encounter (HOSPITAL_COMMUNITY)
Admission: RE | Admit: 2012-12-18 | Discharge: 2012-12-18 | Disposition: A | Discharge status: Home / Self Care | Payer: Self-pay | Source: Ambulatory Visit | Attending: Cardiology | Admitting: Cardiology

## 2012-12-20 ENCOUNTER — Encounter (HOSPITAL_COMMUNITY): Payer: Self-pay

## 2012-12-20 ENCOUNTER — Telehealth: Payer: Self-pay | Admitting: Oncology

## 2012-12-20 NOTE — Telephone Encounter (Signed)
Talked to pt and gave him appt for july and and September  2014 lab and MD

## 2012-12-23 ENCOUNTER — Encounter (HOSPITAL_COMMUNITY)
Admission: RE | Admit: 2012-12-23 | Discharge: 2012-12-23 | Disposition: A | Discharge status: Home / Self Care | Payer: Self-pay | Source: Ambulatory Visit | Attending: Cardiology | Admitting: Cardiology

## 2012-12-25 ENCOUNTER — Encounter (HOSPITAL_COMMUNITY)
Admission: RE | Admit: 2012-12-25 | Discharge: 2012-12-25 | Disposition: A | Discharge status: Home / Self Care | Payer: Self-pay | Source: Ambulatory Visit | Attending: Cardiology | Admitting: Cardiology

## 2012-12-27 ENCOUNTER — Encounter (HOSPITAL_COMMUNITY): Payer: Self-pay

## 2013-01-01 ENCOUNTER — Encounter (HOSPITAL_COMMUNITY): Payer: Self-pay

## 2013-01-03 ENCOUNTER — Encounter (HOSPITAL_COMMUNITY): Payer: Self-pay

## 2013-01-06 ENCOUNTER — Encounter (HOSPITAL_COMMUNITY)
Admission: RE | Admit: 2013-01-06 | Discharge: 2013-01-06 | Disposition: A | Discharge status: Home / Self Care | Payer: Self-pay | Source: Ambulatory Visit | Attending: Cardiology | Admitting: Cardiology

## 2013-01-06 DIAGNOSIS — I10 Essential (primary) hypertension: Secondary | ICD-10-CM | POA: Insufficient documentation

## 2013-01-06 DIAGNOSIS — I4891 Unspecified atrial fibrillation: Secondary | ICD-10-CM | POA: Insufficient documentation

## 2013-01-06 DIAGNOSIS — Z7902 Long term (current) use of antithrombotics/antiplatelets: Secondary | ICD-10-CM | POA: Insufficient documentation

## 2013-01-06 DIAGNOSIS — Z5189 Encounter for other specified aftercare: Secondary | ICD-10-CM | POA: Insufficient documentation

## 2013-01-06 DIAGNOSIS — I200 Unstable angina: Secondary | ICD-10-CM | POA: Insufficient documentation

## 2013-01-06 DIAGNOSIS — Z9861 Coronary angioplasty status: Secondary | ICD-10-CM | POA: Insufficient documentation

## 2013-01-08 ENCOUNTER — Encounter (HOSPITAL_COMMUNITY)
Admission: RE | Admit: 2013-01-08 | Discharge: 2013-01-08 | Disposition: A | Discharge status: Home / Self Care | Payer: Self-pay | Source: Ambulatory Visit | Attending: Cardiology | Admitting: Cardiology

## 2013-01-10 ENCOUNTER — Encounter (HOSPITAL_COMMUNITY): Payer: Self-pay

## 2013-01-13 ENCOUNTER — Encounter (HOSPITAL_COMMUNITY)
Admission: RE | Admit: 2013-01-13 | Discharge: 2013-01-13 | Disposition: A | Discharge status: Home / Self Care | Payer: Self-pay | Source: Ambulatory Visit | Attending: Cardiology | Admitting: Cardiology

## 2013-01-15 ENCOUNTER — Encounter (HOSPITAL_COMMUNITY)
Admission: RE | Admit: 2013-01-15 | Discharge: 2013-01-15 | Disposition: A | Discharge status: Home / Self Care | Payer: Self-pay | Source: Ambulatory Visit | Attending: Cardiology | Admitting: Cardiology

## 2013-01-17 ENCOUNTER — Encounter (HOSPITAL_COMMUNITY)
Admission: RE | Admit: 2013-01-17 | Discharge: 2013-01-17 | Disposition: A | Discharge status: Home / Self Care | Payer: Self-pay | Source: Ambulatory Visit | Attending: Cardiology | Admitting: Cardiology

## 2013-01-20 ENCOUNTER — Encounter (HOSPITAL_COMMUNITY): Payer: Self-pay

## 2013-01-22 ENCOUNTER — Encounter (HOSPITAL_COMMUNITY)
Admission: RE | Admit: 2013-01-22 | Discharge: 2013-01-22 | Disposition: A | Discharge status: Home / Self Care | Payer: Self-pay | Source: Ambulatory Visit | Attending: Cardiology | Admitting: Cardiology

## 2013-01-24 ENCOUNTER — Encounter (HOSPITAL_COMMUNITY): Payer: Self-pay

## 2013-01-27 ENCOUNTER — Encounter (HOSPITAL_COMMUNITY)
Admission: RE | Admit: 2013-01-27 | Discharge: 2013-01-27 | Disposition: A | Discharge status: Home / Self Care | Payer: Self-pay | Source: Ambulatory Visit | Attending: Cardiology | Admitting: Cardiology

## 2013-01-29 ENCOUNTER — Encounter (HOSPITAL_COMMUNITY): Payer: Self-pay

## 2013-01-31 ENCOUNTER — Encounter (HOSPITAL_COMMUNITY)
Admission: RE | Admit: 2013-01-31 | Discharge: 2013-01-31 | Disposition: A | Discharge status: Home / Self Care | Payer: Self-pay | Source: Ambulatory Visit | Attending: Cardiology | Admitting: Cardiology

## 2013-01-31 ENCOUNTER — Telehealth: Payer: Self-pay | Admitting: Oncology

## 2013-01-31 NOTE — Telephone Encounter (Signed)
Pt called and r/s labs to 7/21 per pt rqst

## 2013-02-03 ENCOUNTER — Encounter (HOSPITAL_COMMUNITY): Payer: Self-pay

## 2013-02-05 ENCOUNTER — Encounter (HOSPITAL_COMMUNITY): Payer: Self-pay

## 2013-02-05 DIAGNOSIS — I4891 Unspecified atrial fibrillation: Secondary | ICD-10-CM | POA: Insufficient documentation

## 2013-02-05 DIAGNOSIS — I200 Unstable angina: Secondary | ICD-10-CM | POA: Insufficient documentation

## 2013-02-05 DIAGNOSIS — Z7902 Long term (current) use of antithrombotics/antiplatelets: Secondary | ICD-10-CM | POA: Insufficient documentation

## 2013-02-05 DIAGNOSIS — I10 Essential (primary) hypertension: Secondary | ICD-10-CM | POA: Insufficient documentation

## 2013-02-05 DIAGNOSIS — Z5189 Encounter for other specified aftercare: Secondary | ICD-10-CM | POA: Insufficient documentation

## 2013-02-05 DIAGNOSIS — Z9861 Coronary angioplasty status: Secondary | ICD-10-CM | POA: Insufficient documentation

## 2013-02-10 ENCOUNTER — Encounter (HOSPITAL_COMMUNITY): Payer: Self-pay

## 2013-02-12 ENCOUNTER — Encounter (HOSPITAL_COMMUNITY): Payer: Self-pay

## 2013-02-14 ENCOUNTER — Encounter (HOSPITAL_COMMUNITY): Payer: Self-pay

## 2013-02-17 ENCOUNTER — Encounter (HOSPITAL_COMMUNITY): Payer: Self-pay

## 2013-02-17 ENCOUNTER — Other Ambulatory Visit: Payer: 59 | Admitting: Lab

## 2013-02-19 ENCOUNTER — Encounter (HOSPITAL_COMMUNITY): Payer: Self-pay

## 2013-02-21 ENCOUNTER — Encounter (HOSPITAL_COMMUNITY)
Admission: RE | Admit: 2013-02-21 | Discharge: 2013-02-21 | Disposition: A | Discharge status: Home / Self Care | Payer: Self-pay | Source: Ambulatory Visit | Attending: Cardiology | Admitting: Cardiology

## 2013-02-24 ENCOUNTER — Other Ambulatory Visit (HOSPITAL_BASED_OUTPATIENT_CLINIC_OR_DEPARTMENT_OTHER): Payer: 59 | Admitting: Lab

## 2013-02-24 ENCOUNTER — Encounter (HOSPITAL_COMMUNITY): Payer: Self-pay

## 2013-02-24 DIAGNOSIS — C61 Malignant neoplasm of prostate: Secondary | ICD-10-CM

## 2013-02-24 DIAGNOSIS — C801 Malignant (primary) neoplasm, unspecified: Secondary | ICD-10-CM

## 2013-02-26 ENCOUNTER — Encounter (HOSPITAL_COMMUNITY): Payer: Self-pay

## 2013-02-28 ENCOUNTER — Encounter (HOSPITAL_COMMUNITY)
Admission: RE | Admit: 2013-02-28 | Discharge: 2013-02-28 | Disposition: A | Discharge status: Home / Self Care | Payer: Self-pay | Source: Ambulatory Visit | Attending: Cardiology | Admitting: Cardiology

## 2013-03-03 ENCOUNTER — Encounter (HOSPITAL_COMMUNITY)
Admission: RE | Admit: 2013-03-03 | Discharge: 2013-03-03 | Disposition: A | Discharge status: Home / Self Care | Payer: Self-pay | Source: Ambulatory Visit | Attending: Cardiology | Admitting: Cardiology

## 2013-03-05 ENCOUNTER — Encounter (HOSPITAL_COMMUNITY)
Admission: RE | Admit: 2013-03-05 | Discharge: 2013-03-05 | Disposition: A | Discharge status: Home / Self Care | Payer: Self-pay | Source: Ambulatory Visit | Attending: Cardiology | Admitting: Cardiology

## 2013-03-07 ENCOUNTER — Encounter (HOSPITAL_COMMUNITY)
Admission: RE | Admit: 2013-03-07 | Discharge: 2013-03-07 | Disposition: A | Discharge status: Home / Self Care | Payer: Self-pay | Source: Ambulatory Visit | Attending: Cardiology | Admitting: Cardiology

## 2013-03-07 DIAGNOSIS — Z9861 Coronary angioplasty status: Secondary | ICD-10-CM | POA: Insufficient documentation

## 2013-03-07 DIAGNOSIS — I4891 Unspecified atrial fibrillation: Secondary | ICD-10-CM | POA: Insufficient documentation

## 2013-03-07 DIAGNOSIS — I10 Essential (primary) hypertension: Secondary | ICD-10-CM | POA: Insufficient documentation

## 2013-03-07 DIAGNOSIS — Z7902 Long term (current) use of antithrombotics/antiplatelets: Secondary | ICD-10-CM | POA: Insufficient documentation

## 2013-03-07 DIAGNOSIS — I200 Unstable angina: Secondary | ICD-10-CM | POA: Insufficient documentation

## 2013-03-07 DIAGNOSIS — Z5189 Encounter for other specified aftercare: Secondary | ICD-10-CM | POA: Insufficient documentation

## 2013-03-10 ENCOUNTER — Encounter (HOSPITAL_COMMUNITY)
Admission: RE | Admit: 2013-03-10 | Discharge: 2013-03-10 | Disposition: A | Discharge status: Home / Self Care | Payer: Self-pay | Source: Ambulatory Visit | Attending: Cardiology | Admitting: Cardiology

## 2013-03-12 ENCOUNTER — Encounter (HOSPITAL_COMMUNITY): Payer: Self-pay

## 2013-03-14 ENCOUNTER — Encounter (HOSPITAL_COMMUNITY): Payer: Self-pay

## 2013-03-17 ENCOUNTER — Encounter (HOSPITAL_COMMUNITY)
Admission: RE | Admit: 2013-03-17 | Discharge: 2013-03-17 | Disposition: A | Discharge status: Home / Self Care | Payer: Self-pay | Source: Ambulatory Visit | Attending: Cardiology | Admitting: Cardiology

## 2013-03-19 ENCOUNTER — Encounter (HOSPITAL_COMMUNITY): Payer: Self-pay

## 2013-03-21 ENCOUNTER — Encounter (HOSPITAL_COMMUNITY)
Admission: RE | Admit: 2013-03-21 | Discharge: 2013-03-21 | Disposition: A | Discharge status: Home / Self Care | Payer: Self-pay | Source: Ambulatory Visit | Attending: Cardiology | Admitting: Cardiology

## 2013-03-24 ENCOUNTER — Encounter (HOSPITAL_COMMUNITY)
Admission: RE | Admit: 2013-03-24 | Discharge: 2013-03-24 | Disposition: A | Discharge status: Home / Self Care | Payer: Self-pay | Source: Ambulatory Visit | Attending: Cardiology | Admitting: Cardiology

## 2013-03-26 ENCOUNTER — Encounter (HOSPITAL_COMMUNITY)
Admission: RE | Admit: 2013-03-26 | Discharge: 2013-03-26 | Disposition: A | Discharge status: Home / Self Care | Payer: Self-pay | Source: Ambulatory Visit | Attending: Cardiology | Admitting: Cardiology

## 2013-03-28 ENCOUNTER — Encounter (HOSPITAL_COMMUNITY): Payer: Self-pay

## 2013-03-31 ENCOUNTER — Encounter (HOSPITAL_COMMUNITY)
Admission: RE | Admit: 2013-03-31 | Discharge: 2013-03-31 | Disposition: A | Discharge status: Home / Self Care | Payer: Self-pay | Source: Ambulatory Visit | Attending: Cardiology | Admitting: Cardiology

## 2013-04-02 ENCOUNTER — Encounter (HOSPITAL_COMMUNITY): Payer: Self-pay

## 2013-04-04 ENCOUNTER — Encounter (HOSPITAL_COMMUNITY)
Admission: RE | Admit: 2013-04-04 | Discharge: 2013-04-04 | Disposition: A | Discharge status: Home / Self Care | Payer: Self-pay | Source: Ambulatory Visit | Attending: Cardiology | Admitting: Cardiology

## 2013-04-05 ENCOUNTER — Other Ambulatory Visit: Payer: Self-pay | Admitting: Oncology

## 2013-04-09 ENCOUNTER — Encounter (HOSPITAL_COMMUNITY): Payer: 59

## 2013-04-09 DIAGNOSIS — Z9861 Coronary angioplasty status: Secondary | ICD-10-CM | POA: Insufficient documentation

## 2013-04-09 DIAGNOSIS — I10 Essential (primary) hypertension: Secondary | ICD-10-CM | POA: Insufficient documentation

## 2013-04-09 DIAGNOSIS — I200 Unstable angina: Secondary | ICD-10-CM | POA: Insufficient documentation

## 2013-04-09 DIAGNOSIS — Z5189 Encounter for other specified aftercare: Secondary | ICD-10-CM | POA: Insufficient documentation

## 2013-04-09 DIAGNOSIS — Z7902 Long term (current) use of antithrombotics/antiplatelets: Secondary | ICD-10-CM | POA: Insufficient documentation

## 2013-04-09 DIAGNOSIS — I4891 Unspecified atrial fibrillation: Secondary | ICD-10-CM | POA: Insufficient documentation

## 2013-04-10 ENCOUNTER — Other Ambulatory Visit: Payer: Self-pay | Admitting: *Deleted

## 2013-04-10 MED ORDER — FOLIC ACID 1 MG PO TABS: ORAL_TABLET | ORAL | Status: DC

## 2013-04-11 ENCOUNTER — Encounter (HOSPITAL_COMMUNITY): Payer: 59

## 2013-04-14 ENCOUNTER — Encounter (HOSPITAL_COMMUNITY): Payer: 59

## 2013-04-16 ENCOUNTER — Encounter (HOSPITAL_COMMUNITY)
Admission: RE | Admit: 2013-04-16 | Discharge: 2013-04-16 | Disposition: A | Discharge status: Home / Self Care | Payer: Self-pay | Source: Ambulatory Visit | Attending: Cardiology | Admitting: Cardiology

## 2013-04-18 ENCOUNTER — Encounter (HOSPITAL_COMMUNITY): Payer: 59

## 2013-04-21 ENCOUNTER — Encounter (HOSPITAL_COMMUNITY): Payer: 59

## 2013-04-21 ENCOUNTER — Other Ambulatory Visit (HOSPITAL_BASED_OUTPATIENT_CLINIC_OR_DEPARTMENT_OTHER): Payer: 59 | Admitting: Lab

## 2013-04-21 DIAGNOSIS — C61 Malignant neoplasm of prostate: Secondary | ICD-10-CM

## 2013-04-21 DIAGNOSIS — C801 Malignant (primary) neoplasm, unspecified: Secondary | ICD-10-CM

## 2013-04-21 LAB — CBC & DIFF AND RETIC
BASO%: 0.6 % (ref 0.0–2.0)
EOS%: 5.3 % (ref 0.0–7.0)
HCT: 36.3 % — ABNORMAL LOW (ref 38.4–49.9)
LYMPH%: 21.4 % (ref 14.0–49.0)
MCH: 37.2 pg — ABNORMAL HIGH (ref 27.2–33.4)
MCHC: 34.7 g/dL (ref 32.0–36.0)
MCV: 107.1 fL — ABNORMAL HIGH (ref 79.3–98.0)
MONO#: 0.5 10*3/uL (ref 0.1–0.9)
MONO%: 6.7 % (ref 0.0–14.0)
NEUT%: 66 % (ref 39.0–75.0)
Platelets: 135 10*3/uL — ABNORMAL LOW (ref 140–400)
RBC: 3.39 10*6/uL — ABNORMAL LOW (ref 4.20–5.82)
WBC: 7 10*3/uL (ref 4.0–10.3)
nRBC: 0 % (ref 0–0)

## 2013-04-21 LAB — COMPREHENSIVE METABOLIC PANEL (CC13)
AST: 66 U/L — ABNORMAL HIGH (ref 5–34)
Albumin: 3.5 g/dL (ref 3.5–5.0)
BUN: 6.4 mg/dL — ABNORMAL LOW (ref 7.0–26.0)
CO2: 25 mEq/L (ref 22–29)
Calcium: 8.9 mg/dL (ref 8.4–10.4)
Chloride: 105 mEq/L (ref 98–109)
Creatinine: 0.8 mg/dL (ref 0.7–1.3)
Glucose: 107 mg/dl (ref 70–140)
Potassium: 3.3 mEq/L — ABNORMAL LOW (ref 3.5–5.1)

## 2013-04-23 ENCOUNTER — Encounter (HOSPITAL_COMMUNITY)
Admission: RE | Admit: 2013-04-23 | Discharge: 2013-04-23 | Disposition: A | Discharge status: Home / Self Care | Payer: Self-pay | Source: Ambulatory Visit | Attending: Cardiology | Admitting: Cardiology

## 2013-04-25 ENCOUNTER — Encounter (HOSPITAL_COMMUNITY): Payer: 59

## 2013-04-28 ENCOUNTER — Ambulatory Visit (HOSPITAL_BASED_OUTPATIENT_CLINIC_OR_DEPARTMENT_OTHER): Payer: 59 | Admitting: Oncology

## 2013-04-28 ENCOUNTER — Encounter (HOSPITAL_COMMUNITY): Payer: 59

## 2013-04-28 ENCOUNTER — Encounter: Payer: Self-pay | Admitting: Oncology

## 2013-04-28 VITALS — BP 124/78 | HR 76 | Temp 98.1°F | Resp 18 | Ht 74.0 in | Wt 227.6 lb

## 2013-04-28 DIAGNOSIS — T458X5A Adverse effect of other primarily systemic and hematological agents, initial encounter: Secondary | ICD-10-CM | POA: Insufficient documentation

## 2013-04-28 DIAGNOSIS — Z23 Encounter for immunization: Secondary | ICD-10-CM

## 2013-04-28 DIAGNOSIS — M87180 Osteonecrosis due to drugs, jaw: Secondary | ICD-10-CM

## 2013-04-28 DIAGNOSIS — C61 Malignant neoplasm of prostate: Secondary | ICD-10-CM

## 2013-04-28 DIAGNOSIS — C7951 Secondary malignant neoplasm of bone: Secondary | ICD-10-CM

## 2013-04-28 HISTORY — DX: Adverse effect of other primarily systemic and hematological agents, initial encounter: M87.180

## 2013-04-28 MED ORDER — ENZALUTAMIDE 40 MG PO CAPS: 160.0000 mg | ORAL_CAPSULE | Freq: Every day | ORAL | Status: DC

## 2013-04-28 MED ORDER — INFLUENZA VAC SPLIT QUAD 0.5 ML IM SUSP
0.5000 mL | Freq: Once | INTRAMUSCULAR | Status: AC
  Administered 2013-04-28: 0.5 mL via INTRAMUSCULAR
  Filled 2013-04-28: qty 0.5

## 2013-04-28 NOTE — Progress Notes (Signed)
Hematology and Oncology Follow Up Visit  Allen Schroeder [id number] Apr 23, 1951 62 y.o. 04/28/2013 2:32 PM   Principle Diagnosis: Encounter Diagnosis  Name Primary?  . Prostate cancer, primary, with metastasis from prostate to other site Yes     Interim History:    Followup visit for this 62 year old man with hormone sensitive prostate cancer metastatic to bone at time of initial diagnosis in November 2009. He had an initial dramatic response to hormonal therapy with monthly Fergon injections and monthly Zometa infusions. PSA fell from 72.9 in October 2009 to a nadir of 0.24 by June of 2012. PSA began to rise again and was 4.09 on 02/06/2012. Bone scan at that point did not show any obvious new lesions. He was started on Enzalutamide and once again has had a very nice response. He had a rapid fall in his PSA and within one month it was down to 0.87 by September 12,2013. And 0.09 by 08/14/2012. Subsequent values this year have all remained undetectable including most recent value of 0.03 from 04/21/2013. Last bone scan done 08/14/2012 did not show any new lesions. He gets some intermittent low back pain but no new areas of pain and now progressive back pain. There is no change in chronic nocturia x4. No hematuria.  He now has an implantable slow release LHRH agonist and just had a new implant placed 03/19/2013 by his urologist.  He was evaluated by Dr. Bradly Chris, oral surgery, recently and was found to have an area of erosion of the mucosa at the base of tooth #18 lower left. 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 He is currently getting Zometa every 3 months.     Medications: reviewed  Allergies: No Known Allergies  Review of Systems: Constitutional:  No constitutional symptoms  HEENT: No sore throat. Respiratory:he has had an intermittent cough and nasal drainage since July. Cardiovascular:  No recurrent palpitations. No ischemic chest  type pain. No leg swelling Gastrointestinal: No change in bowel habit. Genito-Urinary:  See above Musculoskeletal: See above Neurologic: No headache, no change in vision, no focal weakness Skin: No rash or ecchymosis  Remaining ROS negative.     Physical Exam: Blood pressure 124/78, pulse 76, temperature 98.1 F (36.7 C), temperature source Oral, resp. rate 18, height 6\' 2"  (1.88 m), weight 227 lb 9.6 oz (103.239 kg). Wt Readings from Last 3 Encounters:  04/28/13 227 lb 9.6 oz (103.239 kg)  12/16/12 227 lb 9.6 oz (103.239 kg)  11/04/12 229 lb (103.874 kg)     General appearance: Well-nourished Caucasian man. He is put on some weight. HENNT: Pharynx no erythema, exudate, or mass. No thyromegaly Lymph nodes: No cervical, supraclavicular, or axillary adenopathy Breasts: Lungs: Clear to auscultation, resonant to percussion Heart: Regular rhythm, no murmur, no gallop Abdomen: Soft, nontender, no mass, no organomegaly Extremities: No edema, no calf tenderness Musculoskeletal: No joint deformities GU: Vascular: No carotid bruits, no cyanosis Neurologic: Mental status intact, cranial nerves grossly normal, motor strength 5 over 5, reflexes 1+ symmetric Skin: No rash . He has had increased bruising.  Lab Results: Lab Results  Component Value Date   WBC 7.0 04/21/2013   HGB 12.6* 04/21/2013   HCT 36.3* 04/21/2013   MCV 107.1* 04/21/2013   PLT 135* 04/21/2013     Chemistry      Component Value Date/Time   NA 140 04/21/2013 1406   NA 139 02/06/2012 1346   K 3.3* 04/21/2013 1406   K 4.0  02/06/2012 1346   CL 100 12/10/2012 1540   CL 105 02/06/2012 1346   CO2 25 04/21/2013 1406   CO2 23 02/06/2012 1346   BUN 6.4* 04/21/2013 1406   BUN 12 02/06/2012 1346   CREATININE 0.8 04/21/2013 1406   CREATININE 0.90 02/06/2012 1346      Component Value Date/Time   CALCIUM 8.9 04/21/2013 1406   CALCIUM 8.8 02/06/2012 1346   ALKPHOS 84 04/21/2013 1406   ALKPHOS 95 11/04/2012 0807   AST 66* 04/21/2013 1406    AST 93* 11/04/2012 0807   ALT 23 04/21/2013 1406   ALT 37 11/04/2012 0807   BILITOT 1.00 04/21/2013 1406   BILITOT 0.9 11/04/2012 0807      Impression: #1. Hormone sensitive prostate cancer metastatic to bone. Second complete response to [place] subsequent to breakthrough from [place], now durable for one year. Plan: Continue  Xtandi. Continue LHRH agonist. Hold Zometa. See #2 below.  #2. Early osteonecrosis of the jaw I think it is reasonable to hold his Zometa to allow healing and then resume on an every 4-6 month basis.  #3. Ongoing alcohol abuse  #4. History of atrial fibrillation acute onset of June 2010. controlled on current medication He is on betapace, Toprol-XL, and Plavix.  #5. Single-vessel coronary artery disease status post LAD stent August 2011  #6. Hyperlipidemia  Influenza vaccine administered today.  CC:. Dr. Sheryn Bison; Dr. Jethro Bolus; Dr. Valera Castle   Levert Feinstein, MD 9/22/20142:32 PM

## 2013-04-29 ENCOUNTER — Telehealth: Payer: Self-pay | Admitting: Oncology

## 2013-04-29 NOTE — Telephone Encounter (Signed)
, °

## 2013-04-30 ENCOUNTER — Encounter (HOSPITAL_COMMUNITY)
Admission: RE | Admit: 2013-04-30 | Discharge: 2013-04-30 | Disposition: A | Discharge status: Home / Self Care | Payer: Self-pay | Source: Ambulatory Visit | Attending: Cardiology | Admitting: Cardiology

## 2013-05-02 ENCOUNTER — Encounter (HOSPITAL_COMMUNITY)
Admission: RE | Admit: 2013-05-02 | Discharge: 2013-05-02 | Disposition: A | Discharge status: Home / Self Care | Payer: Self-pay | Source: Ambulatory Visit | Attending: Cardiology | Admitting: Cardiology

## 2013-05-05 ENCOUNTER — Encounter (HOSPITAL_COMMUNITY)
Admission: RE | Admit: 2013-05-05 | Discharge: 2013-05-05 | Disposition: A | Discharge status: Home / Self Care | Payer: Self-pay | Source: Ambulatory Visit | Attending: Cardiology | Admitting: Cardiology

## 2013-05-07 ENCOUNTER — Encounter (HOSPITAL_COMMUNITY): Payer: 59

## 2013-05-07 DIAGNOSIS — I4891 Unspecified atrial fibrillation: Secondary | ICD-10-CM | POA: Insufficient documentation

## 2013-05-07 DIAGNOSIS — Z7902 Long term (current) use of antithrombotics/antiplatelets: Secondary | ICD-10-CM | POA: Insufficient documentation

## 2013-05-07 DIAGNOSIS — Z9861 Coronary angioplasty status: Secondary | ICD-10-CM | POA: Insufficient documentation

## 2013-05-07 DIAGNOSIS — I200 Unstable angina: Secondary | ICD-10-CM | POA: Insufficient documentation

## 2013-05-07 DIAGNOSIS — Z5189 Encounter for other specified aftercare: Secondary | ICD-10-CM | POA: Insufficient documentation

## 2013-05-07 DIAGNOSIS — I10 Essential (primary) hypertension: Secondary | ICD-10-CM | POA: Insufficient documentation

## 2013-05-09 ENCOUNTER — Encounter (HOSPITAL_COMMUNITY)
Admission: RE | Admit: 2013-05-09 | Discharge: 2013-05-09 | Disposition: A | Discharge status: Home / Self Care | Payer: Self-pay | Source: Ambulatory Visit | Attending: Cardiology | Admitting: Cardiology

## 2013-05-12 ENCOUNTER — Encounter (HOSPITAL_COMMUNITY): Payer: 59

## 2013-05-14 ENCOUNTER — Encounter (HOSPITAL_COMMUNITY)
Admission: RE | Admit: 2013-05-14 | Discharge: 2013-05-14 | Disposition: A | Discharge status: Home / Self Care | Payer: Self-pay | Source: Ambulatory Visit | Attending: Cardiology | Admitting: Cardiology

## 2013-05-16 ENCOUNTER — Encounter (HOSPITAL_COMMUNITY): Payer: 59

## 2013-05-19 ENCOUNTER — Encounter (HOSPITAL_COMMUNITY)
Admission: RE | Admit: 2013-05-19 | Discharge: 2013-05-19 | Disposition: A | Discharge status: Home / Self Care | Payer: Self-pay | Source: Ambulatory Visit | Attending: Cardiology | Admitting: Cardiology

## 2013-05-21 ENCOUNTER — Other Ambulatory Visit: Payer: Self-pay | Admitting: Cardiology

## 2013-05-21 ENCOUNTER — Encounter (HOSPITAL_COMMUNITY): Payer: 59

## 2013-05-23 ENCOUNTER — Encounter (HOSPITAL_COMMUNITY): Payer: 59

## 2013-05-26 ENCOUNTER — Encounter (HOSPITAL_COMMUNITY)
Admission: RE | Admit: 2013-05-26 | Discharge: 2013-05-26 | Disposition: A | Discharge status: Home / Self Care | Payer: Self-pay | Source: Ambulatory Visit | Attending: Cardiology | Admitting: Cardiology

## 2013-05-27 ENCOUNTER — Ambulatory Visit (INDEPENDENT_AMBULATORY_CARE_PROVIDER_SITE_OTHER): Payer: 59 | Admitting: Gastroenterology

## 2013-05-27 ENCOUNTER — Encounter: Payer: Self-pay | Admitting: Gastroenterology

## 2013-05-27 VITALS — BP 110/70 | HR 71 | Ht 74.0 in | Wt 225.6 lb

## 2013-05-27 DIAGNOSIS — R279 Unspecified lack of coordination: Secondary | ICD-10-CM

## 2013-05-27 DIAGNOSIS — Z7901 Long term (current) use of anticoagulants: Secondary | ICD-10-CM

## 2013-05-27 DIAGNOSIS — R270 Ataxia, unspecified: Secondary | ICD-10-CM

## 2013-05-27 DIAGNOSIS — K709 Alcoholic liver disease, unspecified: Secondary | ICD-10-CM

## 2013-05-27 DIAGNOSIS — F1020 Alcohol dependence, uncomplicated: Secondary | ICD-10-CM

## 2013-05-27 DIAGNOSIS — I4891 Unspecified atrial fibrillation: Secondary | ICD-10-CM

## 2013-05-27 DIAGNOSIS — Z8546 Personal history of malignant neoplasm of prostate: Secondary | ICD-10-CM

## 2013-05-27 MED ORDER — FOLIC ACID 1 MG PO TABS: ORAL_TABLET | ORAL | Status: DC

## 2013-05-27 NOTE — Progress Notes (Signed)
This is a -62year-old Caucasian male alcoholic with stable alcoholic liver disease who has had metastatic prostate cancer and is on high doses of hormonal therapy.  He is followed closely by urology and oncology.  He continues to use alcohol in moderation, and recent liver function tests were checked and were normal.  He denies any mental status changes, or other neurologic problem except for severe ataxia associated with his hormonal therapy.  He continues to use alcohol socially and has not had any DWIs or social consequences of alcohol abuse.[name]  His appetite is good ,he maintains his weight at 225 pounds, he denies a specific food intolerances, does not smoke or use NSAIDs.  He does have atrial fibrillation and is on chronic anticoagulation and Metaprol.  Other problems include hyperlipidemia and use of nitroglycerin for angina, Stadol 120 mg twice a day for pain control, every 3 months of hormonal injection therapy, and a variety of multivitamins.  Currently denies abdominal pain, nausea vomiting, fever, chills, or any other gastrointestinal problems.  He specifically denies melena, hematochezia, acholic stools, dark urine, icterus, or itching.  Current Medications, Allergies, Past Medical History, Past Surgical History, Family History and Social History were reviewed in [employer] record.  ROS: All systems were reviewed and are negative unless otherwise stated in the HPI.          Physical Exam: Blood pressure 110/70, pulse 71 and regular and weight 225 pounds with a BMI of 28.95.  Is a healthy-appearing patient in no acute distress.  I cannot appreciate stigmata of chronic liver disease.  Chest is clear and he is in a regular rhythm without murmurs gallops or rubs.  There is no hepatosplenomegaly, abdominal masses or tenderness.  Bowel sounds are normal.  There is no peripheral edema or phlebitis.  Mental status is normal.    Assessment and Plan: Allen Schroeder is doing well  with his chronic alcoholic liver disease his had no evidence of progression to cirrhosis, and has no biochemical evidence of alcoholic hepatitis.  He seems to be managing his alcohol problem very well when considering his severe problems with metastatic prostate cancer.  I've asked him to continue all medications as currently outlined, and we will continue to see him every 6 months or when necessary as needed.  I've again asked him to be temperate about his alcohol use, and to call me should he feel any need to talk at anytime about his alcohol or general medical problems.    Please copy her primary care physician, referring physician, and pertinent subspecialists.  Dr. Jethro Bolus and Dr. Cephas Darby

## 2013-05-27 NOTE — Patient Instructions (Signed)
We have sent the following medications to your pharmacy for you to pick up at your convenience: Folic Acid

## 2013-05-28 ENCOUNTER — Encounter (HOSPITAL_COMMUNITY): Payer: 59

## 2013-05-30 ENCOUNTER — Encounter (HOSPITAL_COMMUNITY)
Admission: RE | Admit: 2013-05-30 | Discharge: 2013-05-30 | Disposition: A | Discharge status: Home / Self Care | Payer: Self-pay | Source: Ambulatory Visit | Attending: Cardiology | Admitting: Cardiology

## 2013-06-02 ENCOUNTER — Encounter (HOSPITAL_COMMUNITY): Payer: 59

## 2013-06-04 ENCOUNTER — Encounter (HOSPITAL_COMMUNITY): Payer: 59

## 2013-06-06 ENCOUNTER — Encounter (HOSPITAL_COMMUNITY): Payer: 59

## 2013-06-09 ENCOUNTER — Encounter (HOSPITAL_COMMUNITY): Payer: 59

## 2013-06-09 DIAGNOSIS — I4891 Unspecified atrial fibrillation: Secondary | ICD-10-CM | POA: Insufficient documentation

## 2013-06-09 DIAGNOSIS — Z5189 Encounter for other specified aftercare: Secondary | ICD-10-CM | POA: Insufficient documentation

## 2013-06-09 DIAGNOSIS — I10 Essential (primary) hypertension: Secondary | ICD-10-CM | POA: Insufficient documentation

## 2013-06-09 DIAGNOSIS — I200 Unstable angina: Secondary | ICD-10-CM | POA: Insufficient documentation

## 2013-06-09 DIAGNOSIS — Z9861 Coronary angioplasty status: Secondary | ICD-10-CM | POA: Insufficient documentation

## 2013-06-09 DIAGNOSIS — Z7902 Long term (current) use of antithrombotics/antiplatelets: Secondary | ICD-10-CM | POA: Insufficient documentation

## 2013-06-11 ENCOUNTER — Encounter (HOSPITAL_COMMUNITY)
Admission: RE | Admit: 2013-06-11 | Discharge: 2013-06-11 | Disposition: A | Discharge status: Home / Self Care | Payer: Self-pay | Source: Ambulatory Visit | Attending: Cardiology | Admitting: Cardiology

## 2013-06-13 ENCOUNTER — Encounter (HOSPITAL_COMMUNITY): Payer: 59

## 2013-06-16 ENCOUNTER — Encounter (HOSPITAL_COMMUNITY)
Admission: RE | Admit: 2013-06-16 | Discharge: 2013-06-16 | Disposition: A | Discharge status: Home / Self Care | Payer: Self-pay | Source: Ambulatory Visit | Attending: Cardiology | Admitting: Cardiology

## 2013-06-18 ENCOUNTER — Encounter (HOSPITAL_COMMUNITY)
Admission: RE | Admit: 2013-06-18 | Discharge: 2013-06-18 | Disposition: A | Discharge status: Home / Self Care | Payer: Self-pay | Source: Ambulatory Visit | Attending: Cardiology | Admitting: Cardiology

## 2013-06-20 ENCOUNTER — Encounter (HOSPITAL_COMMUNITY): Payer: 59

## 2013-06-23 ENCOUNTER — Encounter (HOSPITAL_COMMUNITY)
Admission: RE | Admit: 2013-06-23 | Discharge: 2013-06-23 | Disposition: A | Discharge status: Home / Self Care | Payer: Self-pay | Source: Ambulatory Visit | Attending: Cardiology | Admitting: Cardiology

## 2013-06-24 ENCOUNTER — Ambulatory Visit (INDEPENDENT_AMBULATORY_CARE_PROVIDER_SITE_OTHER): Payer: 59 | Admitting: Cardiovascular Disease

## 2013-06-24 ENCOUNTER — Encounter (INDEPENDENT_AMBULATORY_CARE_PROVIDER_SITE_OTHER): Payer: Self-pay

## 2013-06-24 ENCOUNTER — Encounter: Payer: Self-pay | Admitting: Cardiovascular Disease

## 2013-06-24 VITALS — BP 110/80 | HR 71 | Ht 74.0 in | Wt 226.0 lb

## 2013-06-24 DIAGNOSIS — I4891 Unspecified atrial fibrillation: Secondary | ICD-10-CM

## 2013-06-24 DIAGNOSIS — I2510 Atherosclerotic heart disease of native coronary artery without angina pectoris: Secondary | ICD-10-CM

## 2013-06-24 DIAGNOSIS — Z9861 Coronary angioplasty status: Secondary | ICD-10-CM

## 2013-06-24 DIAGNOSIS — Z955 Presence of coronary angioplasty implant and graft: Secondary | ICD-10-CM

## 2013-06-24 NOTE — Patient Instructions (Signed)
Your physician recommends that you continue on your current medications as directed. Please refer to the Current Medication list given to you today.  Your physician wants you to follow-up in one year with Dr Excell Seltzer. You will receive a reminder letter in the mail two months in advance. If you don't receive a letter, please call our office to schedule the follow-up appointment.

## 2013-06-24 NOTE — Progress Notes (Signed)
HPI:  62 year old gentleman presenting for followup evaluation. He has previously been seen by Dr. wall. The patient is followed for atrial fibrillation and coronary artery disease. He has maintained sinus rhythm on antiarrhythmic therapy with sotalol. He is not anticoagulated because of bleeding risk. The patient has metastatic prostate cancer. He also has alcohol-related chronic liver disease.  The patient has been doing well from a cardiac perspective. He has no chest pain, chest pressure, shortness of breath, edema, or palpitations. He continues in the maintenance phase of cardiac rehabilitation. He reports no medication changes. He reports no recent bleeding problems. He has been maintained on long-term Plavix.  Outpatient Encounter Prescriptions as of 06/24/2013  Medication Sig  . CALCIUM CARB-CHOLECALCIFEROL PO Take 1 capsule by mouth daily.   . clopidogrel (PLAVIX) 75 MG tablet TAKE ONE TABLET BY MOUTH ONCE DAILY  . CRESTOR 20 MG tablet TAKE ONE TABLET BY MOUTH ONCE DAILY  . diphenhydrAMINE (BENADRYL) 25 MG tablet Take 25 mg by mouth every 6 (six) hours as needed.    . enzalutamide (XTANDI) 40 MG capsule Take 4 capsules (160 mg total) by mouth daily.  . folic acid (FOLVITE) 1 MG tablet Take one tablet by mouth once a day....  [name] metoprolol succinate (TOPROL-XL) 25 MG 24 hr tablet TAKE ONE TABLET BY MOUTH ONCE DAILY  . Multiple Vitamin (MULTIVITAMIN) tablet Take 1 tablet by mouth daily.    . nitroGLYCERIN (NITROSTAT) 0.4 MG SL tablet Place 1 tablet (0.4 mg total) under the tongue every 5 (five) minutes as needed.  [name] PRESCRIPTION MEDICATION Chlorhexidene rinse bid orally  . sotalol (BETAPACE) 120 MG tablet TAKE ONE TABLET TWICE DAILY  . UNKNOWN TO PATIENT Slow release hormone implant placed 8/13 by Dr. Karalee Height.  Good for one year  . zolendronic acid (ZOMETA) 4 MG/5ML injection Inject 4 mg into the vein every 3 (three) months.     No Known Allergies  Past Medical History    Diagnosis Date  . Atrial fibrillation   . Other and unspecified hyperlipidemia   . Unspecified essential hypertension   . Malignant neoplasm of prostate 11/2007    Mets to Bone  . Elevated prostate specific antigen (PSA)   . Personal history of colonic polyps   . Hypocalcemia   . Acute alcoholic hepatitis   . Essential and other specified forms of tremor   . Anxiety state, unspecified   . Family history of malignant neoplasm of gastrointestinal tract   . Alcohol abuse with physiological dependence 02/13/2012  . 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 Paroxysmal atrial fibrillation. The patient is maintaining sinus rhythm. He is tolerating sotalol long-term without problems. I will see him back in one year.  2. Coronary atherosclerosis, native vessel. Continue Plavix. No anginal symptoms. No changes made today.  3. Hyperlipidemia. The patient is tolerating Crestor. Lipids reviewed from 11/04/2012 with a  cholesterol of 156, triglycerides 200, HDL 55, and LDL 61.   Tonny Bollman 06/24/2013 2:39 PM

## 2013-06-25 ENCOUNTER — Encounter (HOSPITAL_COMMUNITY)
Admission: RE | Admit: 2013-06-25 | Discharge: 2013-06-25 | Disposition: A | Discharge status: Home / Self Care | Payer: Self-pay | Source: Ambulatory Visit | Attending: Cardiology | Admitting: Cardiology

## 2013-06-27 ENCOUNTER — Encounter (HOSPITAL_COMMUNITY)
Admission: RE | Admit: 2013-06-27 | Discharge: 2013-06-27 | Disposition: A | Discharge status: Home / Self Care | Payer: Self-pay | Source: Ambulatory Visit | Attending: Cardiology | Admitting: Cardiology

## 2013-06-30 ENCOUNTER — Encounter (HOSPITAL_COMMUNITY): Payer: 59

## 2013-07-02 ENCOUNTER — Encounter (HOSPITAL_COMMUNITY): Payer: 59

## 2013-07-02 ENCOUNTER — Ambulatory Visit: Payer: 59 | Admitting: Cardiovascular Disease

## 2013-07-07 ENCOUNTER — Encounter (HOSPITAL_COMMUNITY): Payer: 59

## 2013-07-07 DIAGNOSIS — I200 Unstable angina: Secondary | ICD-10-CM | POA: Insufficient documentation

## 2013-07-07 DIAGNOSIS — Z9861 Coronary angioplasty status: Secondary | ICD-10-CM | POA: Insufficient documentation

## 2013-07-07 DIAGNOSIS — Z7902 Long term (current) use of antithrombotics/antiplatelets: Secondary | ICD-10-CM | POA: Insufficient documentation

## 2013-07-07 DIAGNOSIS — Z5189 Encounter for other specified aftercare: Secondary | ICD-10-CM | POA: Insufficient documentation

## 2013-07-07 DIAGNOSIS — I4891 Unspecified atrial fibrillation: Secondary | ICD-10-CM | POA: Insufficient documentation

## 2013-07-07 DIAGNOSIS — I10 Essential (primary) hypertension: Secondary | ICD-10-CM | POA: Insufficient documentation

## 2013-07-09 ENCOUNTER — Encounter (HOSPITAL_COMMUNITY)
Admission: RE | Admit: 2013-07-09 | Discharge: 2013-07-09 | Disposition: A | Discharge status: Home / Self Care | Payer: Self-pay | Source: Ambulatory Visit | Attending: Cardiology | Admitting: Cardiology

## 2013-07-11 ENCOUNTER — Encounter (HOSPITAL_COMMUNITY)
Admission: RE | Admit: 2013-07-11 | Discharge: 2013-07-11 | Disposition: A | Discharge status: Home / Self Care | Payer: Self-pay | Source: Ambulatory Visit | Attending: Cardiology | Admitting: Cardiology

## 2013-07-14 ENCOUNTER — Encounter (HOSPITAL_COMMUNITY)
Admission: RE | Admit: 2013-07-14 | Discharge: 2013-07-14 | Disposition: A | Discharge status: Home / Self Care | Payer: Self-pay | Source: Ambulatory Visit | Attending: Cardiology | Admitting: Cardiology

## 2013-07-15 ENCOUNTER — Other Ambulatory Visit: Payer: Self-pay | Admitting: *Deleted

## 2013-07-15 MED ORDER — ROSUVASTATIN CALCIUM 20 MG PO TABS: ORAL_TABLET | ORAL | Status: DC

## 2013-07-15 MED ORDER — SOTALOL HCL 120 MG PO TABS: ORAL_TABLET | ORAL | Status: DC

## 2013-07-15 MED ORDER — CLOPIDOGREL BISULFATE 75 MG PO TABS: ORAL_TABLET | ORAL | Status: DC

## 2013-07-15 MED ORDER — METOPROLOL SUCCINATE ER 25 MG PO TB24: ORAL_TABLET | ORAL | Status: DC

## 2013-07-16 ENCOUNTER — Encounter (HOSPITAL_COMMUNITY)
Admission: RE | Admit: 2013-07-16 | Discharge: 2013-07-16 | Disposition: A | Discharge status: Home / Self Care | Payer: Self-pay | Source: Ambulatory Visit | Attending: Cardiology | Admitting: Cardiology

## 2013-07-18 ENCOUNTER — Encounter (HOSPITAL_COMMUNITY): Payer: 59

## 2013-07-21 ENCOUNTER — Encounter (HOSPITAL_COMMUNITY)
Admission: RE | Admit: 2013-07-21 | Discharge: 2013-07-21 | Disposition: A | Discharge status: Home / Self Care | Payer: Self-pay | Source: Ambulatory Visit | Attending: Cardiology | Admitting: Cardiology

## 2013-07-23 ENCOUNTER — Encounter (HOSPITAL_COMMUNITY): Payer: 59

## 2013-07-25 ENCOUNTER — Encounter (HOSPITAL_COMMUNITY): Payer: 59

## 2013-07-28 ENCOUNTER — Encounter (HOSPITAL_COMMUNITY)
Admission: RE | Admit: 2013-07-28 | Discharge: 2013-07-28 | Disposition: A | Discharge status: Home / Self Care | Payer: Self-pay | Source: Ambulatory Visit | Attending: Cardiology | Admitting: Cardiology

## 2013-07-30 ENCOUNTER — Encounter (HOSPITAL_COMMUNITY): Payer: 59

## 2013-08-04 ENCOUNTER — Encounter (HOSPITAL_COMMUNITY): Payer: 59

## 2013-08-06 ENCOUNTER — Encounter (HOSPITAL_COMMUNITY): Payer: 59

## 2013-08-08 ENCOUNTER — Encounter (HOSPITAL_COMMUNITY)
Admission: RE | Admit: 2013-08-08 | Discharge: 2013-08-08 | Disposition: A | Discharge status: Home / Self Care | Payer: Self-pay | Source: Ambulatory Visit | Attending: Cardiology | Admitting: Cardiology

## 2013-08-08 DIAGNOSIS — I4891 Unspecified atrial fibrillation: Secondary | ICD-10-CM | POA: Insufficient documentation

## 2013-08-08 DIAGNOSIS — Z5189 Encounter for other specified aftercare: Secondary | ICD-10-CM | POA: Insufficient documentation

## 2013-08-08 DIAGNOSIS — Z7902 Long term (current) use of antithrombotics/antiplatelets: Secondary | ICD-10-CM | POA: Insufficient documentation

## 2013-08-08 DIAGNOSIS — I200 Unstable angina: Secondary | ICD-10-CM | POA: Insufficient documentation

## 2013-08-08 DIAGNOSIS — Z9861 Coronary angioplasty status: Secondary | ICD-10-CM | POA: Insufficient documentation

## 2013-08-08 DIAGNOSIS — I10 Essential (primary) hypertension: Secondary | ICD-10-CM | POA: Insufficient documentation

## 2013-08-11 ENCOUNTER — Encounter (HOSPITAL_COMMUNITY)
Admission: RE | Admit: 2013-08-11 | Discharge: 2013-08-11 | Disposition: A | Discharge status: Home / Self Care | Payer: Self-pay | Source: Ambulatory Visit | Attending: Cardiology | Admitting: Cardiology

## 2013-08-12 ENCOUNTER — Telehealth: Payer: Self-pay | Admitting: *Deleted

## 2013-08-12 NOTE — Telephone Encounter (Signed)
Pt called & reports that his insurance has changed from [facility] to [place] they may call to confirm his prescription for xtandi.  Notified [facility] & informed pt to bring in a copy of his insurance card.

## 2013-08-13 ENCOUNTER — Encounter (HOSPITAL_COMMUNITY)
Admission: RE | Admit: 2013-08-13 | Discharge: 2013-08-13 | Disposition: A | Discharge status: Home / Self Care | Payer: Self-pay | Source: Ambulatory Visit | Attending: Cardiology | Admitting: Cardiology

## 2013-08-15 ENCOUNTER — Encounter (HOSPITAL_COMMUNITY): Payer: 59

## 2013-08-18 ENCOUNTER — Encounter (HOSPITAL_COMMUNITY)
Admission: RE | Admit: 2013-08-18 | Discharge: 2013-08-18 | Disposition: A | Discharge status: Home / Self Care | Payer: Self-pay | Source: Ambulatory Visit | Attending: Cardiology | Admitting: Cardiology

## 2013-08-20 ENCOUNTER — Encounter (HOSPITAL_COMMUNITY)
Admission: RE | Admit: 2013-08-20 | Discharge: 2013-08-20 | Disposition: A | Discharge status: Home / Self Care | Payer: Self-pay | Source: Ambulatory Visit | Attending: Cardiology | Admitting: Cardiology

## 2013-08-21 ENCOUNTER — Encounter: Payer: Self-pay | Admitting: Oncology

## 2013-08-21 NOTE — Progress Notes (Signed)
Faxed pa form to [place] for xtandi; bcbs id# [id number]

## 2013-08-22 ENCOUNTER — Encounter (HOSPITAL_COMMUNITY)
Admission: RE | Admit: 2013-08-22 | Discharge: 2013-08-22 | Disposition: A | Discharge status: Home / Self Care | Payer: Self-pay | Source: Ambulatory Visit | Attending: Cardiology | Admitting: Cardiology

## 2013-08-22 ENCOUNTER — Encounter: Payer: Self-pay | Admitting: Oncology

## 2013-08-22 NOTE — Progress Notes (Signed)
[employer] approved xtandi from 08/22/13-08/06/2038.

## 2013-08-25 ENCOUNTER — Encounter (HOSPITAL_COMMUNITY)
Admission: RE | Admit: 2013-08-25 | Discharge: 2013-08-25 | Disposition: A | Discharge status: Home / Self Care | Payer: Self-pay | Source: Ambulatory Visit | Attending: Cardiology | Admitting: Cardiology

## 2013-08-27 ENCOUNTER — Encounter (HOSPITAL_COMMUNITY): Payer: 59

## 2013-08-29 ENCOUNTER — Encounter (HOSPITAL_COMMUNITY): Payer: 59

## 2013-09-01 ENCOUNTER — Encounter (HOSPITAL_COMMUNITY)
Admission: RE | Admit: 2013-09-01 | Discharge: 2013-09-01 | Disposition: A | Discharge status: Home / Self Care | Payer: Self-pay | Source: Ambulatory Visit | Attending: Cardiology | Admitting: Cardiology

## 2013-09-03 ENCOUNTER — Encounter (HOSPITAL_COMMUNITY): Payer: 59

## 2013-09-05 ENCOUNTER — Encounter (HOSPITAL_COMMUNITY)
Admission: RE | Admit: 2013-09-05 | Discharge: 2013-09-05 | Disposition: A | Discharge status: Home / Self Care | Payer: Self-pay | Source: Ambulatory Visit | Attending: Cardiology | Admitting: Cardiology

## 2013-09-08 ENCOUNTER — Encounter (HOSPITAL_COMMUNITY)
Admission: RE | Admit: 2013-09-08 | Discharge: 2013-09-08 | Disposition: A | Discharge status: Home / Self Care | Payer: Self-pay | Source: Ambulatory Visit | Attending: Cardiovascular Disease | Admitting: Cardiovascular Disease

## 2013-09-08 DIAGNOSIS — I200 Unstable angina: Secondary | ICD-10-CM | POA: Insufficient documentation

## 2013-09-08 DIAGNOSIS — Z7902 Long term (current) use of antithrombotics/antiplatelets: Secondary | ICD-10-CM | POA: Insufficient documentation

## 2013-09-08 DIAGNOSIS — I4891 Unspecified atrial fibrillation: Secondary | ICD-10-CM | POA: Insufficient documentation

## 2013-09-08 DIAGNOSIS — I10 Essential (primary) hypertension: Secondary | ICD-10-CM | POA: Insufficient documentation

## 2013-09-08 DIAGNOSIS — Z5189 Encounter for other specified aftercare: Secondary | ICD-10-CM | POA: Insufficient documentation

## 2013-09-08 DIAGNOSIS — Z9861 Coronary angioplasty status: Secondary | ICD-10-CM | POA: Insufficient documentation

## 2013-09-10 ENCOUNTER — Encounter (HOSPITAL_COMMUNITY)
Admission: RE | Admit: 2013-09-10 | Discharge: 2013-09-10 | Disposition: A | Discharge status: Home / Self Care | Payer: Self-pay | Source: Ambulatory Visit | Attending: Cardiovascular Disease | Admitting: Cardiovascular Disease

## 2013-09-12 ENCOUNTER — Encounter (HOSPITAL_COMMUNITY)
Admission: RE | Admit: 2013-09-12 | Discharge: 2013-09-12 | Disposition: A | Discharge status: Home / Self Care | Payer: Self-pay | Source: Ambulatory Visit | Attending: Cardiovascular Disease | Admitting: Cardiovascular Disease

## 2013-09-15 ENCOUNTER — Encounter (HOSPITAL_COMMUNITY)
Admission: RE | Admit: 2013-09-15 | Discharge: 2013-09-15 | Disposition: A | Discharge status: Home / Self Care | Payer: Self-pay | Source: Ambulatory Visit | Attending: Cardiovascular Disease | Admitting: Cardiovascular Disease

## 2013-09-17 ENCOUNTER — Encounter (HOSPITAL_COMMUNITY): Payer: Self-pay

## 2013-09-19 ENCOUNTER — Encounter (HOSPITAL_COMMUNITY)
Admission: RE | Admit: 2013-09-19 | Discharge: 2013-09-19 | Disposition: A | Discharge status: Home / Self Care | Payer: Self-pay | Source: Ambulatory Visit | Attending: Cardiovascular Disease | Admitting: Cardiovascular Disease

## 2013-09-22 ENCOUNTER — Encounter (HOSPITAL_COMMUNITY): Payer: Self-pay

## 2013-09-24 ENCOUNTER — Encounter (HOSPITAL_COMMUNITY): Payer: Self-pay

## 2013-09-26 ENCOUNTER — Encounter (HOSPITAL_COMMUNITY): Payer: Self-pay

## 2013-09-29 ENCOUNTER — Encounter (HOSPITAL_COMMUNITY): Payer: Self-pay

## 2013-10-01 ENCOUNTER — Encounter (HOSPITAL_COMMUNITY): Payer: Self-pay

## 2013-10-03 ENCOUNTER — Encounter (HOSPITAL_COMMUNITY): Payer: Self-pay

## 2013-10-04 ENCOUNTER — Telehealth: Payer: Self-pay | Admitting: *Deleted

## 2013-10-04 NOTE — Telephone Encounter (Signed)
Former pt of DR. G...td  

## 2013-10-06 ENCOUNTER — Encounter (HOSPITAL_COMMUNITY): Payer: 59

## 2013-10-06 DIAGNOSIS — I10 Essential (primary) hypertension: Secondary | ICD-10-CM | POA: Insufficient documentation

## 2013-10-06 DIAGNOSIS — Z5189 Encounter for other specified aftercare: Secondary | ICD-10-CM | POA: Insufficient documentation

## 2013-10-06 DIAGNOSIS — Z7902 Long term (current) use of antithrombotics/antiplatelets: Secondary | ICD-10-CM | POA: Insufficient documentation

## 2013-10-06 DIAGNOSIS — Z9861 Coronary angioplasty status: Secondary | ICD-10-CM | POA: Insufficient documentation

## 2013-10-06 DIAGNOSIS — I4891 Unspecified atrial fibrillation: Secondary | ICD-10-CM | POA: Insufficient documentation

## 2013-10-06 DIAGNOSIS — I200 Unstable angina: Secondary | ICD-10-CM | POA: Insufficient documentation

## 2013-10-08 ENCOUNTER — Encounter (HOSPITAL_COMMUNITY): Payer: Self-pay

## 2013-10-10 ENCOUNTER — Telehealth: Payer: Self-pay | Admitting: Oncology

## 2013-10-10 ENCOUNTER — Encounter (HOSPITAL_COMMUNITY)
Admission: RE | Admit: 2013-10-10 | Discharge: 2013-10-10 | Disposition: A | Discharge status: Home / Self Care | Payer: Self-pay | Source: Ambulatory Visit | Attending: Cardiovascular Disease | Admitting: Cardiovascular Disease

## 2013-10-10 NOTE — Telephone Encounter (Signed)
pt called to r/s appt due to going outof town...done...pt aware of new d.t °

## 2013-10-13 ENCOUNTER — Encounter (HOSPITAL_COMMUNITY)
Admission: RE | Admit: 2013-10-13 | Discharge: 2013-10-13 | Disposition: A | Discharge status: Home / Self Care | Payer: 59 | Source: Ambulatory Visit | Attending: Cardiovascular Disease | Admitting: Cardiovascular Disease

## 2013-10-15 ENCOUNTER — Encounter (HOSPITAL_COMMUNITY)
Admission: RE | Admit: 2013-10-15 | Discharge: 2013-10-15 | Disposition: A | Discharge status: Home / Self Care | Payer: Self-pay | Source: Ambulatory Visit | Attending: Cardiovascular Disease | Admitting: Cardiovascular Disease

## 2013-10-17 ENCOUNTER — Encounter (HOSPITAL_COMMUNITY): Payer: Self-pay

## 2013-10-20 ENCOUNTER — Ambulatory Visit: Payer: 59 | Admitting: Nurse Practitioner

## 2013-10-20 ENCOUNTER — Other Ambulatory Visit: Payer: 59

## 2013-10-20 ENCOUNTER — Encounter (HOSPITAL_COMMUNITY)
Admission: RE | Admit: 2013-10-20 | Discharge: 2013-10-20 | Disposition: A | Discharge status: Home / Self Care | Payer: Self-pay | Source: Ambulatory Visit | Attending: Cardiovascular Disease | Admitting: Cardiovascular Disease

## 2013-10-22 ENCOUNTER — Encounter (HOSPITAL_COMMUNITY)
Admission: RE | Admit: 2013-10-22 | Discharge: 2013-10-22 | Disposition: A | Discharge status: Home / Self Care | Payer: Self-pay | Source: Ambulatory Visit | Attending: Cardiovascular Disease | Admitting: Cardiovascular Disease

## 2013-10-24 ENCOUNTER — Ambulatory Visit: Payer: BC Managed Care – PPO | Admitting: Family Medicine

## 2013-10-24 ENCOUNTER — Encounter (HOSPITAL_COMMUNITY): Payer: Self-pay

## 2013-10-24 VITALS — BP 124/76 | HR 85 | Temp 98.6°F | Resp 17 | Ht 73.5 in | Wt 230.0 lb

## 2013-10-24 DIAGNOSIS — J019 Acute sinusitis, unspecified: Secondary | ICD-10-CM

## 2013-10-24 LAB — POCT CBC
Granulocyte percent: 65.8 %G (ref 37–80)
HCT, POC: 40.2 % — AB (ref 43.5–53.7)
Hemoglobin: 12.7 g/dL — AB (ref 14.1–18.1)
Lymph, poc: 1.6 (ref 0.6–3.4)
MCH: 36.7 pg — AB (ref 27–31.2)
MCHC: 31.6 g/dL — AB (ref 31.8–35.4)
MCV: 116.2 fL — AB (ref 80–97)
MID (CBC): 0.5 (ref 0–0.9)
MPV: 8.6 fL (ref 0–99.8)
PLATELET COUNT, POC: 181 10*3/uL (ref 142–424)
POC Granulocyte: 4.1 (ref 2–6.9)
POC LYMPH PERCENT: 26 %L (ref 10–50)
POC MID %: 8.2 % (ref 0–12)
RBC: 3.46 M/uL — AB (ref 4.69–6.13)
RDW, POC: 15.3 %
WBC: 6.2 10*3/uL (ref 4.6–10.2)

## 2013-10-24 MED ORDER — CEFDINIR 300 MG PO CAPS: 300.0000 mg | ORAL_CAPSULE | Freq: Two times a day (BID) | ORAL | Status: DC

## 2013-10-24 NOTE — Patient Instructions (Signed)
Please use the omnicef for sinus infection.  You are a bit anemic, but otherwise your blood count looks ok.  Let me know if you are not better in the next few days- Sooner if worse.

## 2013-10-24 NOTE — Progress Notes (Signed)
[facility] [address], [place] [address] [phone]  Date:  10/24/2013   Name:  Allen Schroeder   DOB:  [birth year]   MRN:  [id number]  PCP:  Verl Blalock, MD    Chief Complaint: Laryngitis, Cough, Sinusitis and URI  History of Present Illness:  Allen Schroeder is a 63 y.o. very pleasant male patient who presents with the following:  Sinus congestion with green drainage for approximately 2-3 weeks. Swollen gland on left side of neck. Coughing up phlegm (green mucus) in the morning but not at other times of the day for about 2-3 weeks.  Hoarse voice for about a week as well. Seemed to get better about a week or so ago and then symptoms worsened and have persisted since that time.   Patient is on antiandrogen daily therapy due to metastatic prostate cancer. Also formerly took [place], a bisphosphonate, but was found to have osteonecrosis of the jaw so this was stopped per patient.     Patient Active Problem List   Diagnosis Date Noted  . 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 Prostate cancer, primary, with metastasis from prostate to other site 02/13/2012  . History of placement of stent in LAD coronary artery 02/13/2012  . Alcohol abuse with physiological dependence 02/13/2012  . CAD, NATIVE VESSEL 04/26/2010  . HYPERLIPIDEMIA 01/22/2009  . ATRIAL FIBRILLATION 12/29/2008  . ESSENTIAL HYPERTENSION 02/10/2008    Past Medical History  Diagnosis Date  . Atrial fibrillation   . Other and unspecified hyperlipidemia   . Unspecified essential hypertension   . Malignant neoplasm of prostate 11/2007    Mets to Bone  . Elevated prostate specific antigen (PSA)   . Personal history of colonic polyps   . Hypocalcemia   . Acute alcoholic hepatitis   . Essential and other specified forms of tremor   . Anxiety state, unspecified   . Family history of malignant neoplasm of gastrointestinal tract   . Alcohol abuse with physiological  dependence 02/13/2012  . Bisphosphonate-associated osteonecrosis of the jaw 04/28/2013    Localized area base of tooth #18 noted 8/14 by oral surgeon    Past Surgical History  Procedure Laterality Date  . Vasectomy    . Cholecystectomy    . Coronary stent placement  2011    History  Substance Use Topics  . Smoking status: Former Smoker    Types: Cigarettes  . Smokeless tobacco: Never Used     Comment: stopped in college  . Alcohol Use: Yes     Comment: has consumed as much as a 1/2 gallon of vodka in a day, now drinks at least 3-5 drinks per day.     Family History  Problem Relation Age of Onset  . Prostate cancer Father   . Coronary artery disease Father   . Colon cancer Father   . Aneurysm Mother     No Known Allergies  Medication list has been reviewed and updated.  Current Outpatient Prescriptions on File Prior to Visit  Medication Sig Dispense Refill  . CALCIUM CARB-CHOLECALCIFEROL PO Take 1 capsule by mouth daily.       . clopidogrel (PLAVIX) 75 MG tablet TAKE ONE TABLET BY MOUTH ONCE DAILY  30 tablet  10  . diphenhydrAMINE (BENADRYL) 25 MG tablet Take 25 mg by mouth every 6 (six) hours as needed.        . enzalutamide (XTANDI) 40 MG capsule Take 4 capsules (160 mg total) by mouth daily.  120 capsule  6  . folic acid (FOLVITE) 1 MG tablet Take one tablet by mouth once a day....  30 tablet  11  . metoprolol succinate (TOPROL-XL) 25 MG 24 hr tablet TAKE ONE TABLET BY MOUTH ONCE DAILY  30 tablet  10  . Multiple Vitamin (MULTIVITAMIN) tablet Take 1 tablet by mouth daily.        . nitroGLYCERIN (NITROSTAT) 0.4 MG SL tablet Place 1 tablet (0.4 mg total) under the tongue every 5 (five) minutes as needed.  25 tablet  11  . PRESCRIPTION MEDICATION Chlorhexidene rinse bid orally      . rosuvastatin (CRESTOR) 20 MG tablet TAKE ONE TABLET BY MOUTH ONCE DAILY  30 tablet  10  . sotalol (BETAPACE) 120 MG tablet TAKE ONE TABLET TWICE DAILY  60 tablet  10  . UNKNOWN TO PATIENT Slow  release hormone implant placed 8/13 by Dr. Willette Cluster.  Good for one year      . zolendronic acid (ZOMETA) 4 MG/5ML injection Inject 4 mg into the vein every 3 (three) months.        No current facility-administered medications on file prior to visit.    Review of Systems:  ROS- no fevers, chills, nausea, vomiting. Denies headaches, dysuria, ear aches, eye pain or blurry vision, rash  Physical Examination: Filed Vitals:   10/24/13 1418  BP: 124/76  Pulse: 85  Temp: 98.6 F (37 C)  Resp: 17   Filed Vitals:   10/24/13 1418  Height: 6' 1.5" (1.867 m)  Weight: 230 lb (104.327 kg)   Body mass index is 29.93 kg/(m^2). Ideal Body Weight: Weight in (lb) to have BMI = 25: 191.7  Gen: NAD, resting comfortably in chair HEENT: nares erythematous and swollen with green drainage, TM normal bilaterally, oropharynx normal with exception of some cobblestoning, no exudate on tonsils Neck: no lymphadenopathy CV: RRR no mrg  Lungs: clear to auscultation bilaterally, no wheezes or rales Abd: soft/nontender MSK: moves all extremities, trace edema bilaterally Skin: warm and dry Neuro: grossly normal, moves all extremities.   Results for orders placed in visit on 10/24/13 (from the past 24 hour(s))  POCT CBC     Status: Abnormal   Collection Time    10/24/13  3:27 PM      Result Value Ref Range   WBC 6.2  4.6 - 10.2 K/uL   Lymph, poc 1.6  0.6 - 3.4   POC LYMPH PERCENT 26.0  10 - 50 %L   MID (cbc) 0.5  0 - 0.9   POC MID % 8.2  0 - 12 %M   POC Granulocyte 4.1  2 - 6.9   Granulocyte percent 65.8  37 - 80 %G   RBC 3.46 (*) 4.69 - 6.13 M/uL   Hemoglobin 12.7 (*) 14.1 - 18.1 g/dL   HCT, POC 40.2 (*) 43.5 - 53.7 %   MCV 116.2 (*) 80 - 97 fL   MCH, POC 36.7 (*) 27 - 31.2 pg   MCHC 31.6 (*) 31.8 - 35.4 g/dL   RDW, POC 15.3     Platelet Count, POC 181  142 - 424 K/uL   MPV 8.6  0 - 99.8 fL    Assessment and Plan:  Bacterial Sinusitis Sinus Congestion/cough/sore throat x 2-3 weeks  which initially improved then worsened. Patient does have metastatic prostate cancer and is on antiandrogen therapy but not on immunosuppressive chemotherapy. Checked CBC which showed mild anemia which patient states has been a chronic issue for  which he is followed. Given history of atrial fibrillation, would like to avoid azithromycin. Will treat with course of Omnicef. Red flags for return given.   Dr. Lorelei Pont has seen and evaluated the patient. We have discussed the history, exam, assessment, and plan as noted above. He agrees with management.   Brayton Mars. Melanee Spry, MD, PGY3 10/24/2013 3:01 PM ______________________________________________________________________________________________________  Signed Lamar Blinks, MD  Noted to have mild, persistent macrocytic anemia.  Will pass a note to Dr. Beryle Beams- suspect this is due to his chronic illness.   Received note back from Dr. Beryle Beams- anemia due to metastatic cancer and alcohol- no need for further eval at this time.    Meds ordered this encounter  Medications  . cefdinir (OMNICEF) 300 MG capsule    Sig: Take 1 capsule (300 mg total) by mouth 2 (two) times daily.    Dispense:  20 capsule    Refill:  0

## 2013-10-27 ENCOUNTER — Encounter (HOSPITAL_COMMUNITY)
Admission: RE | Admit: 2013-10-27 | Discharge: 2013-10-27 | Disposition: A | Discharge status: Home / Self Care | Payer: Self-pay | Source: Ambulatory Visit | Attending: Cardiovascular Disease | Admitting: Cardiovascular Disease

## 2013-10-29 ENCOUNTER — Encounter (HOSPITAL_COMMUNITY): Payer: Self-pay

## 2013-10-31 ENCOUNTER — Encounter (HOSPITAL_COMMUNITY): Payer: Self-pay

## 2013-11-03 ENCOUNTER — Encounter (HOSPITAL_COMMUNITY): Payer: Self-pay

## 2013-11-05 ENCOUNTER — Encounter (HOSPITAL_COMMUNITY): Payer: Medicare Other

## 2013-11-05 ENCOUNTER — Other Ambulatory Visit: Payer: 59

## 2013-11-05 ENCOUNTER — Ambulatory Visit: Payer: 59 | Admitting: Oncology

## 2013-11-05 DIAGNOSIS — Z9861 Coronary angioplasty status: Secondary | ICD-10-CM | POA: Insufficient documentation

## 2013-11-05 DIAGNOSIS — I10 Essential (primary) hypertension: Secondary | ICD-10-CM | POA: Insufficient documentation

## 2013-11-05 DIAGNOSIS — I200 Unstable angina: Secondary | ICD-10-CM | POA: Insufficient documentation

## 2013-11-05 DIAGNOSIS — Z5189 Encounter for other specified aftercare: Secondary | ICD-10-CM | POA: Insufficient documentation

## 2013-11-05 DIAGNOSIS — Z7902 Long term (current) use of antithrombotics/antiplatelets: Secondary | ICD-10-CM | POA: Insufficient documentation

## 2013-11-05 DIAGNOSIS — I4891 Unspecified atrial fibrillation: Secondary | ICD-10-CM | POA: Insufficient documentation

## 2013-11-07 ENCOUNTER — Encounter (HOSPITAL_COMMUNITY): Payer: Self-pay

## 2013-11-10 ENCOUNTER — Encounter (HOSPITAL_COMMUNITY): Payer: Self-pay

## 2013-11-12 ENCOUNTER — Encounter (HOSPITAL_COMMUNITY): Payer: Self-pay

## 2013-11-14 ENCOUNTER — Encounter (HOSPITAL_COMMUNITY)
Admission: RE | Admit: 2013-11-14 | Discharge: 2013-11-14 | Disposition: A | Discharge status: Home / Self Care | Payer: Self-pay | Source: Ambulatory Visit | Attending: Cardiovascular Disease | Admitting: Cardiovascular Disease

## 2013-11-17 ENCOUNTER — Encounter (HOSPITAL_COMMUNITY)
Admission: RE | Admit: 2013-11-17 | Discharge: 2013-11-17 | Disposition: A | Discharge status: Home / Self Care | Payer: Self-pay | Source: Ambulatory Visit | Attending: Cardiovascular Disease | Admitting: Cardiovascular Disease

## 2013-11-19 ENCOUNTER — Encounter (HOSPITAL_COMMUNITY): Payer: Self-pay

## 2013-11-21 ENCOUNTER — Encounter (HOSPITAL_COMMUNITY)
Admission: RE | Admit: 2013-11-21 | Discharge: 2013-11-21 | Disposition: A | Discharge status: Home / Self Care | Payer: Self-pay | Source: Ambulatory Visit | Attending: Cardiovascular Disease | Admitting: Cardiovascular Disease

## 2013-11-24 ENCOUNTER — Encounter (HOSPITAL_COMMUNITY)
Admission: RE | Admit: 2013-11-24 | Discharge: 2013-11-24 | Disposition: A | Discharge status: Home / Self Care | Payer: Self-pay | Source: Ambulatory Visit | Attending: Cardiovascular Disease | Admitting: Cardiovascular Disease

## 2013-11-26 ENCOUNTER — Other Ambulatory Visit: Payer: Self-pay | Admitting: *Deleted

## 2013-11-26 ENCOUNTER — Encounter (HOSPITAL_COMMUNITY)
Admission: RE | Admit: 2013-11-26 | Discharge: 2013-11-26 | Disposition: A | Discharge status: Home / Self Care | Payer: Self-pay | Source: Ambulatory Visit | Attending: Cardiovascular Disease | Admitting: Cardiovascular Disease

## 2013-11-26 DIAGNOSIS — C61 Malignant neoplasm of prostate: Secondary | ICD-10-CM

## 2013-11-27 ENCOUNTER — Encounter: Payer: Self-pay | Admitting: Oncology

## 2013-11-27 ENCOUNTER — Other Ambulatory Visit (HOSPITAL_BASED_OUTPATIENT_CLINIC_OR_DEPARTMENT_OTHER): Payer: Medicare Other

## 2013-11-27 ENCOUNTER — Other Ambulatory Visit: Payer: Self-pay

## 2013-11-27 ENCOUNTER — Telehealth: Payer: Self-pay | Admitting: Oncology

## 2013-11-27 ENCOUNTER — Ambulatory Visit (HOSPITAL_BASED_OUTPATIENT_CLINIC_OR_DEPARTMENT_OTHER): Payer: Medicare Other | Admitting: Oncology

## 2013-11-27 VITALS — BP 134/80 | HR 74 | Temp 98.1°F | Resp 18 | Ht 73.5 in | Wt 230.8 lb

## 2013-11-27 DIAGNOSIS — M278 Other specified diseases of jaws: Secondary | ICD-10-CM

## 2013-11-27 DIAGNOSIS — C61 Malignant neoplasm of prostate: Secondary | ICD-10-CM

## 2013-11-27 DIAGNOSIS — C7952 Secondary malignant neoplasm of bone marrow: Secondary | ICD-10-CM

## 2013-11-27 DIAGNOSIS — C7951 Secondary malignant neoplasm of bone: Secondary | ICD-10-CM

## 2013-11-27 DIAGNOSIS — G589 Mononeuropathy, unspecified: Secondary | ICD-10-CM | POA: Diagnosis not present

## 2013-11-27 LAB — COMPREHENSIVE METABOLIC PANEL (CC13)
ALT: 21 U/L (ref 0–55)
AST: 72 U/L — AB (ref 5–34)
Albumin: 3.7 g/dL (ref 3.5–5.0)
Alkaline Phosphatase: 116 U/L (ref 40–150)
Anion Gap: 13 mEq/L — ABNORMAL HIGH (ref 3–11)
BUN: 10 mg/dL (ref 7.0–26.0)
CALCIUM: 9.6 mg/dL (ref 8.4–10.4)
CHLORIDE: 103 meq/L (ref 98–109)
CO2: 22 mEq/L (ref 22–29)
Creatinine: 0.9 mg/dL (ref 0.7–1.3)
Glucose: 110 mg/dl (ref 70–140)
POTASSIUM: 3.9 meq/L (ref 3.5–5.1)
SODIUM: 138 meq/L (ref 136–145)
Total Bilirubin: 0.77 mg/dL (ref 0.20–1.20)
Total Protein: 8.3 g/dL (ref 6.4–8.3)

## 2013-11-27 LAB — CBC WITH DIFFERENTIAL/PLATELET
BASO%: 0.5 % (ref 0.0–2.0)
Basophils Absolute: 0 10*3/uL (ref 0.0–0.1)
EOS%: 3.9 % (ref 0.0–7.0)
Eosinophils Absolute: 0.3 10*3/uL (ref 0.0–0.5)
HCT: 36.4 % — ABNORMAL LOW (ref 38.4–49.9)
HGB: 12.4 g/dL — ABNORMAL LOW (ref 13.0–17.1)
LYMPH#: 1.9 10*3/uL (ref 0.9–3.3)
LYMPH%: 21.8 % (ref 14.0–49.0)
MCH: 38.4 pg — AB (ref 27.2–33.4)
MCHC: 34.1 g/dL (ref 32.0–36.0)
MCV: 112.7 fL — ABNORMAL HIGH (ref 79.3–98.0)
MONO#: 0.6 10*3/uL (ref 0.1–0.9)
MONO%: 7.1 % (ref 0.0–14.0)
NEUT#: 5.7 10*3/uL (ref 1.5–6.5)
NEUT%: 66.7 % (ref 39.0–75.0)
Platelets: 142 10*3/uL (ref 140–400)
RBC: 3.23 10*6/uL — AB (ref 4.20–5.82)
RDW: 14.3 % (ref 11.0–14.6)
WBC: 8.5 10*3/uL (ref 4.0–10.3)

## 2013-11-27 MED ORDER — ENZALUTAMIDE 40 MG PO CAPS: 160.0000 mg | ORAL_CAPSULE | Freq: Every day | ORAL | Status: DC

## 2013-11-27 NOTE — Telephone Encounter (Signed)
gv adn printed appt sched and avs for pt for July  °

## 2013-11-27 NOTE — Progress Notes (Signed)
Faxed xtandi prescription to [facility] °

## 2013-11-27 NOTE — Addendum Note (Signed)
Addended by: Randolm Idol on: 11/27/2013 03:25 PM   Modules accepted: Orders

## 2013-11-27 NOTE — Progress Notes (Signed)
Hematology and Oncology Follow Up Visit  Allen Schroeder [id number] 02-Apr-1951 63 y.o. 11/27/2013 3:12 PM Allen Schroeder, MDPatterson, Allen Pacas, MD   Principle Diagnosis: 63 year old gentleman with castration resistant prostate cancer with metastatic disease to the bone. He was initially diagnosed in October of 2009 with bony metastasis.   Prior Therapy: He was treated with hormonal therapy with monthly Fermagon injections and monthly Zometa infusions. PSA fell from 72.9 in October 2009 to a nadir of 0.24 by June of 2012.  PSA began to rise again and was 4.09 on 02/06/2012. Bone scan at that point did not show any obvious new lesions. He was started on Enzalutamide and once again has had a very nice response since.   Current therapy: Xtandi 160 mg daily started September of 2013 with an excellent response PSA is close to 0 to  Interim History:  [name] presents today for a followup visit. He is a pleasant gentleman he is to be followed by Dr. Beryle Schroeder for the above diagnosis. He had been doing very well on Xtandi without any problems. Has not reported any nausea or vomiting or abdominal pain. Has not reported any neurological symptoms but does report occasional neuropathy. He has not reported any back pain shoulder pain or pathological fractures. He continues to perform activities of daily without any hindrance or decline. Has not reported any lower extremity edema or recent hospitalization. Has not reported any genitourinary complaints, petechiae or lymphadenopathy. 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  Medications: I have reviewed the patient's current medications.  Current Outpatient Prescriptions  Medication Sig Dispense Refill  . CALCIUM CARB-CHOLECALCIFEROL PO Take 1 capsule by mouth daily.       . cefdinir (OMNICEF) 300 MG capsule Take 1 capsule (300 mg total) by mouth 2 (two) times daily.  20 capsule  0  . clopidogrel (PLAVIX) 75 MG tablet TAKE ONE TABLET BY MOUTH  ONCE DAILY  30 tablet  10  . diphenhydrAMINE (BENADRYL) 25 MG tablet Take 25 mg by mouth every 6 (six) hours as needed.        . enzalutamide (XTANDI) 40 MG capsule Take 4 capsules (160 mg total) by mouth daily.  120 capsule  6  . folic acid (FOLVITE) 1 MG tablet Take one tablet by mouth once a day....  30 tablet  11  . metoprolol succinate (TOPROL-XL) 25 MG 24 hr tablet TAKE ONE TABLET BY MOUTH ONCE DAILY  30 tablet  10  . Multiple Vitamin (MULTIVITAMIN) tablet Take 1 tablet by mouth daily.        . nitroGLYCERIN (NITROSTAT) 0.4 MG SL tablet Place 1 tablet (0.4 mg total) under the tongue every 5 (five) minutes as needed.  25 tablet  11  . PRESCRIPTION MEDICATION Chlorhexidene rinse bid orally      . rosuvastatin (CRESTOR) 20 MG tablet TAKE ONE TABLET BY MOUTH ONCE DAILY  30 tablet  10  . sotalol (BETAPACE) 120 MG tablet TAKE ONE TABLET TWICE DAILY  60 tablet  10  . UNKNOWN TO PATIENT Slow release hormone implant placed 8/13 by Dr. Willette Schroeder.  Good for one year      . zolendronic acid (ZOMETA) 4 MG/5ML injection Inject 4 mg into the vein every 3 (three) months.        No current facility-administered medications for this visit.     Allergies: No Known Allergies  Past Medical History, Surgical history, Social history, and Family History were reviewed and updated.  Review of  Systems: Constitutional:  Negative for fever, chills, night sweats, anorexia, weight loss, pain. Cardiovascular: no chest pain or dyspnea on exertion Respiratory: no cough, shortness of breath, or wheezing Neurological: no TIA or stroke symptoms Dermatological: negative for pruritus, rash and skin lesion changes ENT: negative for - epistaxis or vertigo Skin: Negative. Gastrointestinal: no abdominal pain, change in bowel habits, or black or bloody stools Genito-Urinary: negative for - hematuria or pelvic pain Hematological and Lymphatic: negative for - bruising, fatigue or night sweats Breast:  negative Musculoskeletal: negative for - joint swelling or swelling in all joints.  Remaining ROS negative. Physical Exam: Blood pressure 134/80, pulse 74, temperature 98.1 F (36.7 C), temperature source Oral, resp. rate 18, height 6' 1.5" (1.867 m), weight 230 lb 12.8 oz (104.69 kg). ECOG: 1 General appearance: alert, cooperative and appears stated age Head: Normocephalic, without obvious abnormality, atraumatic Neck: no adenopathy, no carotid bruit, no JVD, supple, symmetrical, trachea midline and thyroid not enlarged, symmetric, no tenderness/mass/nodules Lymph nodes: Cervical, supraclavicular, and axillary nodes normal. Heart:regular rate and rhythm, S1, S2 normal, no murmur, click, rub or gallop Lung:chest clear, no wheezing, rales, normal symmetric air entry Abdomin: soft, non-tender, without masses or organomegaly EXT:no erythema, induration, or nodules   Lab Results: Lab Results  Component Value Date   WBC 8.5 11/27/2013   HGB 12.4* 11/27/2013   HCT 36.4* 11/27/2013   MCV 112.7* 11/27/2013   PLT 142 11/27/2013     Chemistry      Component Value Date/Time   NA 140 04/21/2013 1406   NA 139 02/06/2012 1346   K 3.3* 04/21/2013 1406   K 4.0 02/06/2012 1346   CL 100 12/10/2012 1540   CL 105 02/06/2012 1346   CO2 25 04/21/2013 1406   CO2 23 02/06/2012 1346   BUN 6.4* 04/21/2013 1406   BUN 12 02/06/2012 1346   CREATININE 0.8 04/21/2013 1406   CREATININE 0.90 02/06/2012 1346      Component Value Date/Time   CALCIUM 8.9 04/21/2013 1406   CALCIUM 8.8 02/06/2012 1346   ALKPHOS 84 04/21/2013 1406   ALKPHOS 95 11/04/2012 0807   AST 66* 04/21/2013 1406   AST 93* 11/04/2012 0807   ALT 23 04/21/2013 1406   ALT 37 11/04/2012 0807   BILITOT 1.00 04/21/2013 1406   BILITOT 0.9 11/04/2012 0807       Results for Allen Schroeder (MRN [id number]) as of 11/27/2013 14:58  Ref. Range 12/10/2012 15:40 02/24/2013 14:30 04/21/2013 14:06  PSA Latest Range: <=4.00 ng/mL 0.05 0.04 0.03    Impression and  Plan:  63 year old on with the following issues:  1. Castration resistant prostate cancer with metastatic disease to the bone. His continued on Xtandi with excellent response in his PSA. My plan is to continue his current medication with the same dosing and schedule. We'll continue to monitor his PSA closely.  2.Early osteonecrosis of the jaw. It is reasonable to hold his Zometa to allow healing and then resume on an every 4-6 month basis.   3. Androgen deprivation: He is currently has implant for androgen depravation. I recommend this continues for the time being.  4. Followup: Will be in 3 months for an assessment.  Wyatt Portela, MD 4/23/20153:12 PM

## 2013-11-28 ENCOUNTER — Encounter (HOSPITAL_COMMUNITY)
Admission: RE | Admit: 2013-11-28 | Discharge: 2013-11-28 | Disposition: A | Discharge status: Home / Self Care | Payer: Self-pay | Source: Ambulatory Visit | Attending: Cardiovascular Disease | Admitting: Cardiovascular Disease

## 2013-11-28 LAB — PSA: PSA: 0.02 ng/mL (ref <=4.00)

## 2013-12-01 ENCOUNTER — Encounter (HOSPITAL_COMMUNITY)
Admission: RE | Admit: 2013-12-01 | Discharge: 2013-12-01 | Disposition: A | Discharge status: Home / Self Care | Payer: Self-pay | Source: Ambulatory Visit | Attending: Cardiovascular Disease | Admitting: Cardiovascular Disease

## 2013-12-03 ENCOUNTER — Encounter (HOSPITAL_COMMUNITY): Payer: Self-pay

## 2013-12-05 ENCOUNTER — Encounter (HOSPITAL_COMMUNITY): Payer: Self-pay

## 2013-12-05 DIAGNOSIS — Z7902 Long term (current) use of antithrombotics/antiplatelets: Secondary | ICD-10-CM | POA: Insufficient documentation

## 2013-12-05 DIAGNOSIS — I10 Essential (primary) hypertension: Secondary | ICD-10-CM | POA: Insufficient documentation

## 2013-12-05 DIAGNOSIS — I4891 Unspecified atrial fibrillation: Secondary | ICD-10-CM | POA: Insufficient documentation

## 2013-12-05 DIAGNOSIS — Z9861 Coronary angioplasty status: Secondary | ICD-10-CM | POA: Insufficient documentation

## 2013-12-05 DIAGNOSIS — Z5189 Encounter for other specified aftercare: Secondary | ICD-10-CM | POA: Insufficient documentation

## 2013-12-05 DIAGNOSIS — I200 Unstable angina: Secondary | ICD-10-CM | POA: Insufficient documentation

## 2013-12-08 ENCOUNTER — Encounter (HOSPITAL_COMMUNITY)
Admission: RE | Admit: 2013-12-08 | Discharge: 2013-12-08 | Disposition: A | Discharge status: Home / Self Care | Payer: Self-pay | Source: Ambulatory Visit | Attending: Cardiovascular Disease | Admitting: Cardiovascular Disease

## 2013-12-10 ENCOUNTER — Encounter (HOSPITAL_COMMUNITY)
Admission: RE | Admit: 2013-12-10 | Discharge: 2013-12-10 | Disposition: A | Discharge status: Home / Self Care | Payer: Self-pay | Source: Ambulatory Visit | Attending: Cardiovascular Disease | Admitting: Cardiovascular Disease

## 2013-12-12 ENCOUNTER — Encounter (HOSPITAL_COMMUNITY): Payer: Self-pay

## 2013-12-15 ENCOUNTER — Encounter (HOSPITAL_COMMUNITY)
Admission: RE | Admit: 2013-12-15 | Discharge: 2013-12-15 | Disposition: A | Discharge status: Home / Self Care | Payer: Self-pay | Source: Ambulatory Visit | Attending: Cardiovascular Disease | Admitting: Cardiovascular Disease

## 2013-12-17 ENCOUNTER — Encounter (HOSPITAL_COMMUNITY)
Admission: RE | Admit: 2013-12-17 | Discharge: 2013-12-17 | Disposition: A | Discharge status: Home / Self Care | Payer: Self-pay | Source: Ambulatory Visit | Attending: Cardiovascular Disease | Admitting: Cardiovascular Disease

## 2013-12-19 ENCOUNTER — Encounter (HOSPITAL_COMMUNITY)
Admission: RE | Admit: 2013-12-19 | Discharge: 2013-12-19 | Disposition: A | Discharge status: Home / Self Care | Payer: Self-pay | Source: Ambulatory Visit | Attending: Cardiovascular Disease | Admitting: Cardiovascular Disease

## 2013-12-22 ENCOUNTER — Encounter (HOSPITAL_COMMUNITY): Payer: Self-pay

## 2013-12-24 ENCOUNTER — Encounter (HOSPITAL_COMMUNITY): Payer: Self-pay

## 2013-12-24 DIAGNOSIS — M279 Disease of jaws, unspecified: Secondary | ICD-10-CM | POA: Diagnosis not present

## 2013-12-24 DIAGNOSIS — T458X5A Adverse effect of other primarily systemic and hematological agents, initial encounter: Secondary | ICD-10-CM | POA: Diagnosis not present

## 2013-12-24 DIAGNOSIS — M8708 Idiopathic aseptic necrosis of bone, other site: Secondary | ICD-10-CM | POA: Diagnosis not present

## 2013-12-26 ENCOUNTER — Encounter (HOSPITAL_COMMUNITY)
Admission: RE | Admit: 2013-12-26 | Discharge: 2013-12-26 | Disposition: A | Discharge status: Home / Self Care | Payer: Self-pay | Source: Ambulatory Visit | Attending: Cardiovascular Disease | Admitting: Cardiovascular Disease

## 2013-12-26 ENCOUNTER — Telehealth: Payer: Self-pay | Admitting: *Deleted

## 2013-12-26 NOTE — Telephone Encounter (Signed)
CALLED [facility] [phone] AND SPOKE TO NISHA. HIS XTANDI WAS APPROVED FROM 12/26/13 TO 12/27/14. [id number].

## 2013-12-26 NOTE — Telephone Encounter (Signed)
Received prior authorization request from [facility] for [place] 40mg .  Gave request to care management.

## 2013-12-31 ENCOUNTER — Encounter (HOSPITAL_COMMUNITY)
Admission: RE | Admit: 2013-12-31 | Discharge: 2013-12-31 | Disposition: A | Discharge status: Home / Self Care | Payer: Self-pay | Source: Ambulatory Visit | Attending: Cardiovascular Disease | Admitting: Cardiovascular Disease

## 2014-01-02 ENCOUNTER — Encounter (HOSPITAL_COMMUNITY): Payer: Self-pay

## 2014-01-05 ENCOUNTER — Encounter (HOSPITAL_COMMUNITY)
Admission: RE | Admit: 2014-01-05 | Discharge: 2014-01-05 | Disposition: A | Discharge status: Home / Self Care | Payer: Self-pay | Source: Ambulatory Visit | Attending: Cardiovascular Disease | Admitting: Cardiovascular Disease

## 2014-01-05 DIAGNOSIS — I10 Essential (primary) hypertension: Secondary | ICD-10-CM | POA: Insufficient documentation

## 2014-01-05 DIAGNOSIS — Z7902 Long term (current) use of antithrombotics/antiplatelets: Secondary | ICD-10-CM | POA: Insufficient documentation

## 2014-01-05 DIAGNOSIS — I200 Unstable angina: Secondary | ICD-10-CM | POA: Insufficient documentation

## 2014-01-05 DIAGNOSIS — I4891 Unspecified atrial fibrillation: Secondary | ICD-10-CM | POA: Insufficient documentation

## 2014-01-05 DIAGNOSIS — Z5189 Encounter for other specified aftercare: Secondary | ICD-10-CM | POA: Insufficient documentation

## 2014-01-05 DIAGNOSIS — Z9861 Coronary angioplasty status: Secondary | ICD-10-CM | POA: Insufficient documentation

## 2014-01-07 ENCOUNTER — Encounter (HOSPITAL_COMMUNITY)
Admission: RE | Admit: 2014-01-07 | Discharge: 2014-01-07 | Disposition: A | Discharge status: Home / Self Care | Payer: Self-pay | Source: Ambulatory Visit | Attending: Cardiovascular Disease | Admitting: Cardiovascular Disease

## 2014-01-09 ENCOUNTER — Encounter (HOSPITAL_COMMUNITY)
Admission: RE | Admit: 2014-01-09 | Discharge: 2014-01-09 | Disposition: A | Discharge status: Home / Self Care | Payer: Self-pay | Source: Ambulatory Visit | Attending: Cardiovascular Disease | Admitting: Cardiovascular Disease

## 2014-01-12 ENCOUNTER — Encounter (HOSPITAL_COMMUNITY)
Admission: RE | Admit: 2014-01-12 | Discharge: 2014-01-12 | Disposition: A | Discharge status: Home / Self Care | Payer: Self-pay | Source: Ambulatory Visit | Attending: Cardiovascular Disease | Admitting: Cardiovascular Disease

## 2014-01-14 ENCOUNTER — Encounter (HOSPITAL_COMMUNITY): Payer: Self-pay

## 2014-01-16 ENCOUNTER — Encounter (HOSPITAL_COMMUNITY)
Admission: RE | Admit: 2014-01-16 | Discharge: 2014-01-16 | Disposition: A | Discharge status: Home / Self Care | Payer: Self-pay | Source: Ambulatory Visit | Attending: Cardiovascular Disease | Admitting: Cardiovascular Disease

## 2014-01-19 ENCOUNTER — Encounter (HOSPITAL_COMMUNITY)
Admission: RE | Admit: 2014-01-19 | Discharge: 2014-01-19 | Disposition: A | Discharge status: Home / Self Care | Payer: Self-pay | Source: Ambulatory Visit | Attending: Cardiovascular Disease | Admitting: Cardiovascular Disease

## 2014-01-21 ENCOUNTER — Encounter (HOSPITAL_COMMUNITY)
Admission: RE | Admit: 2014-01-21 | Discharge: 2014-01-21 | Disposition: A | Discharge status: Home / Self Care | Payer: Medicare Other | Source: Ambulatory Visit | Attending: Cardiovascular Disease | Admitting: Cardiovascular Disease

## 2014-01-23 ENCOUNTER — Encounter (HOSPITAL_COMMUNITY)
Admission: RE | Admit: 2014-01-23 | Discharge: 2014-01-23 | Disposition: A | Discharge status: Home / Self Care | Payer: Self-pay | Source: Ambulatory Visit | Attending: Cardiovascular Disease | Admitting: Cardiovascular Disease

## 2014-01-26 ENCOUNTER — Encounter (HOSPITAL_COMMUNITY): Payer: Self-pay

## 2014-01-28 ENCOUNTER — Encounter (HOSPITAL_COMMUNITY): Payer: Self-pay

## 2014-01-30 ENCOUNTER — Encounter (HOSPITAL_COMMUNITY): Payer: Self-pay

## 2014-02-01 ENCOUNTER — Telehealth: Payer: Self-pay | Admitting: Oncology

## 2014-02-01 NOTE — Telephone Encounter (Signed)
Called pt left message regarding appt on july r/s due to call, mailed appt

## 2014-02-02 ENCOUNTER — Encounter (HOSPITAL_COMMUNITY): Payer: Self-pay

## 2014-02-04 ENCOUNTER — Encounter (HOSPITAL_COMMUNITY): Payer: Self-pay

## 2014-02-04 DIAGNOSIS — I200 Unstable angina: Secondary | ICD-10-CM | POA: Insufficient documentation

## 2014-02-04 DIAGNOSIS — Z9861 Coronary angioplasty status: Secondary | ICD-10-CM | POA: Insufficient documentation

## 2014-02-04 DIAGNOSIS — Z7902 Long term (current) use of antithrombotics/antiplatelets: Secondary | ICD-10-CM | POA: Insufficient documentation

## 2014-02-04 DIAGNOSIS — I4891 Unspecified atrial fibrillation: Secondary | ICD-10-CM | POA: Insufficient documentation

## 2014-02-04 DIAGNOSIS — Z5189 Encounter for other specified aftercare: Secondary | ICD-10-CM | POA: Insufficient documentation

## 2014-02-04 DIAGNOSIS — I10 Essential (primary) hypertension: Secondary | ICD-10-CM | POA: Insufficient documentation

## 2014-02-06 ENCOUNTER — Encounter (HOSPITAL_COMMUNITY): Payer: Self-pay

## 2014-02-09 ENCOUNTER — Encounter (HOSPITAL_COMMUNITY)
Admission: RE | Admit: 2014-02-09 | Discharge: 2014-02-09 | Disposition: A | Discharge status: Home / Self Care | Payer: Self-pay | Source: Ambulatory Visit | Attending: Cardiovascular Disease | Admitting: Cardiovascular Disease

## 2014-02-11 ENCOUNTER — Encounter (HOSPITAL_COMMUNITY)
Admission: RE | Admit: 2014-02-11 | Discharge: 2014-02-11 | Disposition: A | Discharge status: Home / Self Care | Payer: Self-pay | Source: Ambulatory Visit | Attending: Cardiovascular Disease | Admitting: Cardiovascular Disease

## 2014-02-13 ENCOUNTER — Encounter (HOSPITAL_COMMUNITY)
Admission: RE | Admit: 2014-02-13 | Discharge: 2014-02-13 | Disposition: A | Discharge status: Home / Self Care | Payer: Self-pay | Source: Ambulatory Visit | Attending: Cardiovascular Disease | Admitting: Cardiovascular Disease

## 2014-02-16 ENCOUNTER — Encounter (HOSPITAL_COMMUNITY): Payer: Self-pay

## 2014-02-18 ENCOUNTER — Encounter (HOSPITAL_COMMUNITY): Payer: Self-pay

## 2014-02-20 ENCOUNTER — Encounter (HOSPITAL_COMMUNITY)
Admission: RE | Admit: 2014-02-20 | Discharge: 2014-02-20 | Disposition: A | Discharge status: Home / Self Care | Payer: Self-pay | Source: Ambulatory Visit | Attending: Cardiovascular Disease | Admitting: Cardiovascular Disease

## 2014-02-23 ENCOUNTER — Encounter (HOSPITAL_COMMUNITY): Payer: Self-pay

## 2014-02-25 ENCOUNTER — Encounter (HOSPITAL_COMMUNITY)
Admission: RE | Admit: 2014-02-25 | Discharge: 2014-02-25 | Disposition: A | Discharge status: Home / Self Care | Payer: Self-pay | Source: Ambulatory Visit | Attending: Cardiovascular Disease | Admitting: Cardiovascular Disease

## 2014-02-27 ENCOUNTER — Encounter (HOSPITAL_COMMUNITY)
Admission: RE | Admit: 2014-02-27 | Discharge: 2014-02-27 | Disposition: A | Discharge status: Home / Self Care | Payer: Medicare Other | Source: Ambulatory Visit | Attending: Cardiovascular Disease | Admitting: Cardiovascular Disease

## 2014-03-02 ENCOUNTER — Encounter (HOSPITAL_COMMUNITY): Payer: Self-pay

## 2014-03-04 ENCOUNTER — Encounter (HOSPITAL_COMMUNITY)
Admission: RE | Admit: 2014-03-04 | Discharge: 2014-03-04 | Disposition: A | Discharge status: Home / Self Care | Payer: Self-pay | Source: Ambulatory Visit | Attending: Cardiovascular Disease | Admitting: Cardiovascular Disease

## 2014-03-05 ENCOUNTER — Other Ambulatory Visit (HOSPITAL_BASED_OUTPATIENT_CLINIC_OR_DEPARTMENT_OTHER): Payer: Medicare Other

## 2014-03-05 ENCOUNTER — Ambulatory Visit (HOSPITAL_BASED_OUTPATIENT_CLINIC_OR_DEPARTMENT_OTHER): Payer: Medicare Other | Admitting: Oncology

## 2014-03-05 ENCOUNTER — Telehealth: Payer: Self-pay | Admitting: Oncology

## 2014-03-05 ENCOUNTER — Encounter: Payer: Self-pay | Admitting: Oncology

## 2014-03-05 VITALS — BP 123/71 | HR 77 | Temp 98.4°F | Resp 18 | Ht 73.5 in | Wt 235.5 lb

## 2014-03-05 DIAGNOSIS — C7952 Secondary malignant neoplasm of bone marrow: Secondary | ICD-10-CM

## 2014-03-05 DIAGNOSIS — C61 Malignant neoplasm of prostate: Secondary | ICD-10-CM | POA: Diagnosis not present

## 2014-03-05 DIAGNOSIS — E291 Testicular hypofunction: Secondary | ICD-10-CM

## 2014-03-05 DIAGNOSIS — C801 Malignant (primary) neoplasm, unspecified: Secondary | ICD-10-CM | POA: Diagnosis not present

## 2014-03-05 DIAGNOSIS — C7951 Secondary malignant neoplasm of bone: Secondary | ICD-10-CM

## 2014-03-05 DIAGNOSIS — M8708 Idiopathic aseptic necrosis of bone, other site: Secondary | ICD-10-CM

## 2014-03-05 LAB — CBC WITH DIFFERENTIAL/PLATELET
BASO%: 1 % (ref 0.0–2.0)
Basophils Absolute: 0.1 10*3/uL (ref 0.0–0.1)
EOS%: 4.4 % (ref 0.0–7.0)
Eosinophils Absolute: 0.3 10*3/uL (ref 0.0–0.5)
HEMATOCRIT: 37.7 % — AB (ref 38.4–49.9)
HGB: 12.4 g/dL — ABNORMAL LOW (ref 13.0–17.1)
LYMPH%: 15.6 % (ref 14.0–49.0)
MCH: 37.3 pg — ABNORMAL HIGH (ref 27.2–33.4)
MCHC: 33 g/dL (ref 32.0–36.0)
MCV: 113.2 fL — AB (ref 79.3–98.0)
MONO#: 0.6 10*3/uL (ref 0.1–0.9)
MONO%: 8.1 % (ref 0.0–14.0)
NEUT#: 4.8 10*3/uL (ref 1.5–6.5)
NEUT%: 70.9 % (ref 39.0–75.0)
Platelets: 135 10*3/uL — ABNORMAL LOW (ref 140–400)
RBC: 3.33 10*6/uL — AB (ref 4.20–5.82)
RDW: 14.5 % (ref 11.0–14.6)
WBC: 6.8 10*3/uL (ref 4.0–10.3)
lymph#: 1.1 10*3/uL (ref 0.9–3.3)

## 2014-03-05 LAB — COMPREHENSIVE METABOLIC PANEL (CC13)
ALK PHOS: 118 U/L (ref 40–150)
ALT: 25 U/L (ref 0–55)
ANION GAP: 10 meq/L (ref 3–11)
AST: 74 U/L — ABNORMAL HIGH (ref 5–34)
Albumin: 3.3 g/dL — ABNORMAL LOW (ref 3.5–5.0)
BILIRUBIN TOTAL: 0.71 mg/dL (ref 0.20–1.20)
BUN: 7.3 mg/dL (ref 7.0–26.0)
CO2: 23 meq/L (ref 22–29)
CREATININE: 0.8 mg/dL (ref 0.7–1.3)
Calcium: 9.2 mg/dL (ref 8.4–10.4)
Chloride: 104 mEq/L (ref 98–109)
Glucose: 109 mg/dl (ref 70–140)
Potassium: 3.8 mEq/L (ref 3.5–5.1)
SODIUM: 138 meq/L (ref 136–145)
Total Protein: 7.9 g/dL (ref 6.4–8.3)

## 2014-03-05 NOTE — Telephone Encounter (Signed)
gv and printed appt sched and avs for pt for OCT. °

## 2014-03-05 NOTE — Progress Notes (Signed)
Hematology and Oncology Follow Up Visit  Allen Schroeder [id number] 12-Nov-1950 63 y.o. 03/05/2014 12:09 PM Allen Schroeder, MDPatterson, Allen Pacas, MD   Principle Diagnosis: 63 year old gentleman with castration resistant prostate cancer with metastatic disease to the bone. He was initially diagnosed in October of 2009 with bony metastasis.   Prior Therapy: He was treated with hormonal therapy with monthly Fermagon injections and monthly Zometa infusions. PSA fell from 72.9 in October 2009 to a nadir of 0.24 by June of 2012.  PSA began to rise again and was 4.09 on 02/06/2012. Bone scan at that point did not show any obvious new lesions. He was started on Enzalutamide and once again has had a very nice response since.   Current therapy: Xtandi 160 mg daily started September of 2013 with an excellent response PSA is close to 0 to  Interim History:  [name] presents today for a followup visit. Since his last visit, he reports no new symptoms He had been doing very well on Xtandi without any problems. Has not reported any nausea or vomiting or abdominal pain. Has not reported any neurological symptoms but does report occasional neuropathy that has not changed in quality.. He has not reported any back pain shoulder pain or pathological fractures. He continues to perform activities of daily without any hindrance or decline. Has not reported any lower extremity edema or recent hospitalization. Has not reported any genitourinary complaints, petechiae or lymphadenopathy. The rest of his review of systems unremarkable.   Medications: I have reviewed the patient's current medications.  Current Outpatient Prescriptions  Medication Sig Dispense Refill  . CALCIUM CARB-CHOLECALCIFEROL PO Take 1 capsule by mouth daily.       . cefdinir (OMNICEF) 300 MG capsule Take 1 capsule (300 mg total) by mouth 2 (two) times daily.  20 capsule  0  . clopidogrel (PLAVIX) 75 MG tablet TAKE ONE TABLET BY MOUTH ONCE DAILY  30  tablet  10  . diphenhydrAMINE (BENADRYL) 25 MG tablet Take 25 mg by mouth every 6 (six) hours as needed.        . enzalutamide (XTANDI) 40 MG capsule Take 160 mg by mouth daily. Patient's ins has recently changed, new script given to managed care for financial assistance.      . folic acid (FOLVITE) 1 MG tablet Take one tablet by mouth once a day....  30 tablet  11  . metoprolol succinate (TOPROL-XL) 25 MG 24 hr tablet TAKE ONE TABLET BY MOUTH ONCE DAILY  30 tablet  10  . Multiple Vitamin (MULTIVITAMIN) tablet Take 1 tablet by mouth daily.        . nitroGLYCERIN (NITROSTAT) 0.4 MG SL tablet Place 1 tablet (0.4 mg total) under the tongue every 5 (five) minutes as needed.  25 tablet  11  . PRESCRIPTION MEDICATION Chlorhexidene rinse bid orally      . rosuvastatin (CRESTOR) 20 MG tablet TAKE ONE TABLET BY MOUTH ONCE DAILY  30 tablet  10  . sotalol (BETAPACE) 120 MG tablet TAKE ONE TABLET TWICE DAILY  60 tablet  10  . UNKNOWN TO PATIENT Slow release hormone implant placed 8/13 by Dr. Willette Cluster.  Good for one year      . zolendronic acid (ZOMETA) 4 MG/5ML injection Inject 4 mg into the vein every 3 (three) months.        No current facility-administered medications for this visit.     Allergies: No Known Allergies  Past Medical History, Surgical history, Social history, and Family  History were reviewed and updated.   Physical Exam: Blood pressure 123/71, pulse 77, temperature 98.4 F (36.9 C), temperature source Oral, resp. rate 18, height 6' 1.5" (1.867 m), weight 235 lb 8 oz (106.822 kg), SpO2 95.00%. ECOG: 1 General appearance: alert, cooperative and appears stated age Head: Normocephalic, without obvious abnormality, atraumatic Neck: no adenopathy, no carotid bruit, no JVD, supple, symmetrical, trachea midline and thyroid not enlarged, symmetric, no tenderness/mass/nodules Lymph nodes: Cervical, supraclavicular, and axillary nodes normal. Heart:regular rate and rhythm, S1, S2 normal,  no murmur, click, rub or gallop Lung:chest clear, no wheezing, rales, normal symmetric air entry Abdomin: soft, non-tender, without masses or organomegaly EXT:no erythema, induration, or nodules   Lab Results: Lab Results  Component Value Date   WBC 6.8 03/05/2014   HGB 12.4* 03/05/2014   HCT 37.7* 03/05/2014   MCV 113.2* 03/05/2014   PLT 135* 03/05/2014     Chemistry      Component Value Date/Time   NA 138 11/27/2013 1446   NA 139 02/06/2012 1346   K 3.9 11/27/2013 1446   K 4.0 02/06/2012 1346   CL 100 12/10/2012 1540   CL 105 02/06/2012 1346   CO2 22 11/27/2013 1446   CO2 23 02/06/2012 1346   BUN 10.0 11/27/2013 1446   BUN 12 02/06/2012 1346   CREATININE 0.9 11/27/2013 1446   CREATININE 0.90 02/06/2012 1346      Component Value Date/Time   CALCIUM 9.6 11/27/2013 1446   CALCIUM 8.8 02/06/2012 1346   ALKPHOS 116 11/27/2013 1446   ALKPHOS 95 11/04/2012 0807   AST 72* 11/27/2013 1446   AST 93* 11/04/2012 0807   ALT 21 11/27/2013 1446   ALT 37 11/04/2012 0807   BILITOT 0.77 11/27/2013 1446   BILITOT 0.9 11/04/2012 0807       Results for [name], [name] (MRN [id number]) as of 03/05/2014 12:10  Ref. Range 02/24/2013 14:30 04/21/2013 14:06 11/27/2013 14:46  PSA Latest Range: <=4.00 ng/mL 0.04 0.03 0.02     Impression and Plan:  63 year old on with the following issues:  1. Castration resistant prostate cancer with metastatic disease to the bone. His continued on Xtandi with excellent response and a PSA of 0.04. My plan is to continue his current medication with the same dosing and schedule. We'll continue to monitor his PSA closely.  2.Early osteonecrosis of the jaw. It is reasonable to hold his Zometa to allow healing and then resume on an every 4-6 month basis.  3. Androgen deprivation: He is currently has implant for androgen depravation. I recommend this continues for the time being.  4. Followup: Will be in 3 months for an assessment.  [facility], MD 7/30/201512:09 PM

## 2014-03-06 ENCOUNTER — Encounter (HOSPITAL_COMMUNITY): Payer: Self-pay

## 2014-03-06 LAB — PSA: PSA: 0.01 ng/mL (ref <=4.00)

## 2014-03-09 ENCOUNTER — Encounter (HOSPITAL_COMMUNITY): Payer: Self-pay

## 2014-03-09 DIAGNOSIS — Z5189 Encounter for other specified aftercare: Secondary | ICD-10-CM | POA: Insufficient documentation

## 2014-03-09 DIAGNOSIS — I10 Essential (primary) hypertension: Secondary | ICD-10-CM | POA: Insufficient documentation

## 2014-03-09 DIAGNOSIS — I4891 Unspecified atrial fibrillation: Secondary | ICD-10-CM | POA: Insufficient documentation

## 2014-03-09 DIAGNOSIS — E785 Hyperlipidemia, unspecified: Secondary | ICD-10-CM | POA: Insufficient documentation

## 2014-03-09 DIAGNOSIS — Z7902 Long term (current) use of antithrombotics/antiplatelets: Secondary | ICD-10-CM | POA: Insufficient documentation

## 2014-03-11 ENCOUNTER — Encounter (HOSPITAL_COMMUNITY): Payer: Self-pay

## 2014-03-13 ENCOUNTER — Encounter (HOSPITAL_COMMUNITY): Payer: Self-pay

## 2014-03-16 ENCOUNTER — Encounter (HOSPITAL_COMMUNITY): Payer: Self-pay

## 2014-03-18 ENCOUNTER — Encounter (HOSPITAL_COMMUNITY): Payer: Self-pay

## 2014-03-20 ENCOUNTER — Encounter (HOSPITAL_COMMUNITY): Payer: Self-pay

## 2014-03-23 ENCOUNTER — Encounter (HOSPITAL_COMMUNITY)
Admission: RE | Admit: 2014-03-23 | Discharge: 2014-03-23 | Disposition: A | Discharge status: Home / Self Care | Payer: Self-pay | Source: Ambulatory Visit | Attending: Cardiovascular Disease | Admitting: Cardiovascular Disease

## 2014-03-25 ENCOUNTER — Encounter (HOSPITAL_COMMUNITY)
Admission: RE | Admit: 2014-03-25 | Discharge: 2014-03-25 | Disposition: A | Discharge status: Home / Self Care | Payer: Self-pay | Source: Ambulatory Visit | Attending: Cardiovascular Disease | Admitting: Cardiovascular Disease

## 2014-03-27 ENCOUNTER — Encounter (HOSPITAL_COMMUNITY): Payer: Self-pay

## 2014-03-30 ENCOUNTER — Encounter (HOSPITAL_COMMUNITY): Payer: Self-pay

## 2014-04-01 ENCOUNTER — Encounter (HOSPITAL_COMMUNITY): Payer: Self-pay

## 2014-04-03 ENCOUNTER — Encounter (HOSPITAL_COMMUNITY): Payer: Self-pay

## 2014-04-06 ENCOUNTER — Encounter (HOSPITAL_COMMUNITY)
Admission: RE | Admit: 2014-04-06 | Discharge: 2014-04-06 | Disposition: A | Discharge status: Home / Self Care | Payer: Self-pay | Source: Ambulatory Visit | Attending: Cardiovascular Disease | Admitting: Cardiovascular Disease

## 2014-04-07 DIAGNOSIS — L919 Hypertrophic disorder of the skin, unspecified: Secondary | ICD-10-CM | POA: Diagnosis not present

## 2014-04-07 DIAGNOSIS — D239 Other benign neoplasm of skin, unspecified: Secondary | ICD-10-CM | POA: Diagnosis not present

## 2014-04-07 DIAGNOSIS — L259 Unspecified contact dermatitis, unspecified cause: Secondary | ICD-10-CM | POA: Diagnosis not present

## 2014-04-07 DIAGNOSIS — L909 Atrophic disorder of skin, unspecified: Secondary | ICD-10-CM | POA: Diagnosis not present

## 2014-04-08 ENCOUNTER — Encounter (HOSPITAL_COMMUNITY)
Admission: RE | Admit: 2014-04-08 | Discharge: 2014-04-08 | Disposition: A | Discharge status: Home / Self Care | Payer: Self-pay | Source: Ambulatory Visit | Attending: Cardiovascular Disease | Admitting: Cardiovascular Disease

## 2014-04-08 DIAGNOSIS — Z5189 Encounter for other specified aftercare: Secondary | ICD-10-CM | POA: Insufficient documentation

## 2014-04-08 DIAGNOSIS — I10 Essential (primary) hypertension: Secondary | ICD-10-CM | POA: Insufficient documentation

## 2014-04-08 DIAGNOSIS — Z7902 Long term (current) use of antithrombotics/antiplatelets: Secondary | ICD-10-CM | POA: Insufficient documentation

## 2014-04-08 DIAGNOSIS — E785 Hyperlipidemia, unspecified: Secondary | ICD-10-CM | POA: Insufficient documentation

## 2014-04-08 DIAGNOSIS — I4891 Unspecified atrial fibrillation: Secondary | ICD-10-CM | POA: Insufficient documentation

## 2014-04-10 ENCOUNTER — Encounter (HOSPITAL_COMMUNITY)
Admission: RE | Admit: 2014-04-10 | Discharge: 2014-04-10 | Disposition: A | Discharge status: Home / Self Care | Payer: Self-pay | Source: Ambulatory Visit | Attending: Cardiovascular Disease | Admitting: Cardiovascular Disease

## 2014-04-15 ENCOUNTER — Encounter (HOSPITAL_COMMUNITY)
Admission: RE | Admit: 2014-04-15 | Discharge: 2014-04-15 | Disposition: A | Discharge status: Home / Self Care | Payer: Self-pay | Source: Ambulatory Visit | Attending: Cardiovascular Disease | Admitting: Cardiovascular Disease

## 2014-04-17 ENCOUNTER — Encounter (HOSPITAL_COMMUNITY): Payer: Self-pay

## 2014-04-17 DIAGNOSIS — E559 Vitamin D deficiency, unspecified: Secondary | ICD-10-CM | POA: Diagnosis not present

## 2014-04-17 DIAGNOSIS — C61 Malignant neoplasm of prostate: Secondary | ICD-10-CM | POA: Diagnosis not present

## 2014-04-20 ENCOUNTER — Encounter (HOSPITAL_COMMUNITY): Payer: Self-pay

## 2014-04-22 ENCOUNTER — Encounter (HOSPITAL_COMMUNITY)
Admission: RE | Admit: 2014-04-22 | Discharge: 2014-04-22 | Disposition: A | Discharge status: Home / Self Care | Payer: Self-pay | Source: Ambulatory Visit | Attending: Cardiovascular Disease | Admitting: Cardiovascular Disease

## 2014-04-24 ENCOUNTER — Encounter (HOSPITAL_COMMUNITY): Payer: Self-pay

## 2014-04-27 ENCOUNTER — Encounter (HOSPITAL_COMMUNITY): Payer: Self-pay

## 2014-04-29 ENCOUNTER — Other Ambulatory Visit: Payer: Self-pay | Admitting: Oncology

## 2014-04-29 ENCOUNTER — Encounter (HOSPITAL_COMMUNITY)
Admission: RE | Admit: 2014-04-29 | Discharge: 2014-04-29 | Disposition: A | Discharge status: Home / Self Care | Payer: Self-pay | Source: Ambulatory Visit | Attending: Cardiovascular Disease | Admitting: Cardiovascular Disease

## 2014-04-29 DIAGNOSIS — C61 Malignant neoplasm of prostate: Secondary | ICD-10-CM

## 2014-05-01 ENCOUNTER — Encounter (HOSPITAL_COMMUNITY)
Admission: RE | Admit: 2014-05-01 | Discharge: 2014-05-01 | Disposition: A | Discharge status: Home / Self Care | Payer: Self-pay | Source: Ambulatory Visit | Attending: Cardiovascular Disease | Admitting: Cardiovascular Disease

## 2014-05-04 ENCOUNTER — Encounter (HOSPITAL_COMMUNITY)
Admission: RE | Admit: 2014-05-04 | Discharge: 2014-05-04 | Disposition: A | Discharge status: Home / Self Care | Payer: Medicare Other | Source: Ambulatory Visit | Attending: Cardiovascular Disease | Admitting: Cardiovascular Disease

## 2014-05-06 ENCOUNTER — Encounter (HOSPITAL_COMMUNITY): Payer: Self-pay

## 2014-05-08 ENCOUNTER — Encounter (HOSPITAL_COMMUNITY): Payer: Medicare Other

## 2014-05-08 DIAGNOSIS — I10 Essential (primary) hypertension: Secondary | ICD-10-CM | POA: Insufficient documentation

## 2014-05-08 DIAGNOSIS — Z7902 Long term (current) use of antithrombotics/antiplatelets: Secondary | ICD-10-CM | POA: Insufficient documentation

## 2014-05-08 DIAGNOSIS — I4891 Unspecified atrial fibrillation: Secondary | ICD-10-CM | POA: Insufficient documentation

## 2014-05-08 DIAGNOSIS — E785 Hyperlipidemia, unspecified: Secondary | ICD-10-CM | POA: Insufficient documentation

## 2014-05-08 DIAGNOSIS — Z5189 Encounter for other specified aftercare: Secondary | ICD-10-CM | POA: Insufficient documentation

## 2014-05-11 ENCOUNTER — Encounter (HOSPITAL_COMMUNITY)
Admission: RE | Admit: 2014-05-11 | Discharge: 2014-05-11 | Disposition: A | Discharge status: Home / Self Care | Payer: Self-pay | Source: Ambulatory Visit | Attending: Cardiovascular Disease | Admitting: Cardiovascular Disease

## 2014-05-13 ENCOUNTER — Encounter (HOSPITAL_COMMUNITY): Payer: Self-pay

## 2014-05-15 ENCOUNTER — Encounter (HOSPITAL_COMMUNITY)
Admission: RE | Admit: 2014-05-15 | Discharge: 2014-05-15 | Disposition: A | Discharge status: Home / Self Care | Payer: Self-pay | Source: Ambulatory Visit | Attending: Cardiovascular Disease | Admitting: Cardiovascular Disease

## 2014-05-18 ENCOUNTER — Encounter (HOSPITAL_COMMUNITY)
Admission: RE | Admit: 2014-05-18 | Discharge: 2014-05-18 | Disposition: A | Discharge status: Home / Self Care | Payer: Self-pay | Source: Ambulatory Visit | Attending: Cardiovascular Disease | Admitting: Cardiovascular Disease

## 2014-05-20 ENCOUNTER — Encounter (HOSPITAL_COMMUNITY): Payer: Self-pay

## 2014-05-22 ENCOUNTER — Encounter (HOSPITAL_COMMUNITY): Payer: Self-pay

## 2014-05-25 ENCOUNTER — Encounter (HOSPITAL_COMMUNITY)
Admission: RE | Admit: 2014-05-25 | Discharge: 2014-05-25 | Disposition: A | Discharge status: Home / Self Care | Payer: Self-pay | Source: Ambulatory Visit | Attending: Cardiovascular Disease | Admitting: Cardiovascular Disease

## 2014-05-27 ENCOUNTER — Telehealth: Payer: Self-pay | Admitting: Oncology

## 2014-05-27 ENCOUNTER — Encounter: Payer: Self-pay | Admitting: Oncology

## 2014-05-27 ENCOUNTER — Encounter (HOSPITAL_COMMUNITY): Payer: Self-pay

## 2014-05-27 ENCOUNTER — Ambulatory Visit (HOSPITAL_BASED_OUTPATIENT_CLINIC_OR_DEPARTMENT_OTHER): Payer: Medicare Other | Admitting: Oncology

## 2014-05-27 ENCOUNTER — Other Ambulatory Visit (HOSPITAL_BASED_OUTPATIENT_CLINIC_OR_DEPARTMENT_OTHER): Payer: Medicare Other

## 2014-05-27 VITALS — BP 121/75 | HR 68 | Temp 98.2°F | Resp 18 | Ht 73.5 in | Wt 232.8 lb

## 2014-05-27 DIAGNOSIS — E291 Testicular hypofunction: Secondary | ICD-10-CM | POA: Diagnosis not present

## 2014-05-27 DIAGNOSIS — C7951 Secondary malignant neoplasm of bone: Secondary | ICD-10-CM

## 2014-05-27 DIAGNOSIS — M879 Osteonecrosis, unspecified: Secondary | ICD-10-CM

## 2014-05-27 DIAGNOSIS — C61 Malignant neoplasm of prostate: Secondary | ICD-10-CM

## 2014-05-27 DIAGNOSIS — C801 Malignant (primary) neoplasm, unspecified: Secondary | ICD-10-CM | POA: Diagnosis not present

## 2014-05-27 LAB — COMPREHENSIVE METABOLIC PANEL (CC13)
ALBUMIN: 3.3 g/dL — AB (ref 3.5–5.0)
ALT: 27 U/L (ref 0–55)
ANION GAP: 7 meq/L (ref 3–11)
AST: 92 U/L — AB (ref 5–34)
Alkaline Phosphatase: 129 U/L (ref 40–150)
BUN: 8.7 mg/dL (ref 7.0–26.0)
CALCIUM: 9.4 mg/dL (ref 8.4–10.4)
CHLORIDE: 108 meq/L (ref 98–109)
CO2: 27 meq/L (ref 22–29)
CREATININE: 0.8 mg/dL (ref 0.7–1.3)
Glucose: 76 mg/dl (ref 70–140)
POTASSIUM: 3.9 meq/L (ref 3.5–5.1)
Sodium: 142 mEq/L (ref 136–145)
Total Bilirubin: 0.71 mg/dL (ref 0.20–1.20)
Total Protein: 8 g/dL (ref 6.4–8.3)

## 2014-05-27 LAB — CBC WITH DIFFERENTIAL/PLATELET
BASO%: 0.4 % (ref 0.0–2.0)
Basophils Absolute: 0 10*3/uL (ref 0.0–0.1)
EOS%: 4.4 % (ref 0.0–7.0)
Eosinophils Absolute: 0.4 10*3/uL (ref 0.0–0.5)
HCT: 36.7 % — ABNORMAL LOW (ref 38.4–49.9)
HGB: 12.2 g/dL — ABNORMAL LOW (ref 13.0–17.1)
LYMPH#: 1.7 10*3/uL (ref 0.9–3.3)
LYMPH%: 17.9 % (ref 14.0–49.0)
MCH: 37.9 pg — ABNORMAL HIGH (ref 27.2–33.4)
MCHC: 33.2 g/dL (ref 32.0–36.0)
MCV: 114 fL — AB (ref 79.3–98.0)
MONO#: 0.7 10*3/uL (ref 0.1–0.9)
MONO%: 7.2 % (ref 0.0–14.0)
NEUT#: 6.6 10*3/uL — ABNORMAL HIGH (ref 1.5–6.5)
NEUT%: 70.1 % (ref 39.0–75.0)
PLATELETS: 131 10*3/uL — AB (ref 140–400)
RBC: 3.22 10*6/uL — ABNORMAL LOW (ref 4.20–5.82)
RDW: 14.7 % — AB (ref 11.0–14.6)
WBC: 9.5 10*3/uL (ref 4.0–10.3)

## 2014-05-27 NOTE — Telephone Encounter (Signed)
gv adn printed appt sched and avs for pt for Jan 2016 °

## 2014-05-27 NOTE — Progress Notes (Signed)
Hematology and Oncology Follow Up Visit  Allen Schroeder [id number] 02-Dec-1950 64 y.o. 05/27/2014 1:46 PM Allen Schroeder, MDPatterson, Allen Pacas, MD   Principle Diagnosis: 63 year old gentleman with castration resistant prostate cancer with metastatic disease to the bone. He was initially diagnosed in October of 2009 with bony metastasis.   Prior Therapy: He was treated with hormonal therapy with monthly Fermagon injections and monthly Zometa infusions. PSA fell from 72.9 in October 2009 to a nadir of 0.24 by June of 2012.  PSA began to rise again and was 4.09 on 02/06/2012. Bone scan at that point did not show any obvious new lesions. He was started on Xtandi and once again has had a very nice response since.   Current therapy: Xtandi 160 mg daily started September of 2013 with an excellent response PSA is close to 0.   Interim History:  Allen Schroeder presents today for a followup visit. Since his last visit, he reports no new complaints. He had been doing very well on Xtandi without any problems except for mild fatigue. He will follow up dental surgery soon but has not had any issues. Has not reported any nausea or vomiting or abdominal pain. Has not reported any neurological symptoms but does report occasional neuropathy that has not changed in quality. He has not reported any back pain shoulder pain or pathological fractures. He continues to perform activities of daily without any hindrance or decline. Has not reported any lower extremity edema or recent hospitalization. Has not reported any genitourinary complaints, petechiae or lymphadenopathy.  The rest of his review of systems unremarkable.   Medications: I have reviewed the patient's current medications.  Current Outpatient Prescriptions  Medication Sig Dispense Refill  . CALCIUM CARB-CHOLECALCIFEROL PO Take 1 capsule by mouth daily.       . cefdinir (OMNICEF) 300 MG capsule Take 1 capsule (300 mg total) by mouth 2 (two) times daily.  20  capsule  0  . clopidogrel (PLAVIX) 75 MG tablet TAKE ONE TABLET BY MOUTH ONCE DAILY  30 tablet  10  . diphenhydrAMINE (BENADRYL) 25 MG tablet Take 25 mg by mouth every 6 (six) hours as needed.        . folic acid (FOLVITE) 1 MG tablet Take one tablet by mouth once a day....  30 tablet  11  . metoprolol succinate (TOPROL-XL) 25 MG 24 hr tablet TAKE ONE TABLET BY MOUTH ONCE DAILY  30 tablet  10  . Multiple Vitamin (MULTIVITAMIN) tablet Take 1 tablet by mouth daily.        . nitroGLYCERIN (NITROSTAT) 0.4 MG SL tablet Place 1 tablet (0.4 mg total) under the tongue every 5 (five) minutes as needed.  25 tablet  11  . PRESCRIPTION MEDICATION Chlorhexidene rinse bid orally      . rosuvastatin (CRESTOR) 20 MG tablet TAKE ONE TABLET BY MOUTH ONCE DAILY  30 tablet  10  . sotalol (BETAPACE) 120 MG tablet TAKE ONE TABLET TWICE DAILY  60 tablet  10  . UNKNOWN TO PATIENT Slow release hormone implant placed 8/13 by Dr. Willette Cluster.  Good for one year      . XTANDI 40 MG capsule TAKE 4 CAPSULES BY MOUTH DAILY  120 capsule  1  . zolendronic acid (ZOMETA) 4 MG/5ML injection Inject 4 mg into the vein every 3 (three) months.        No current facility-administered medications for this visit.     Allergies: No Known Allergies  Past Medical History, Surgical history,  Social history, and Family History were reviewed and updated.   Physical Exam: Blood pressure 121/75, pulse 68, temperature 98.2 F (36.8 C), temperature source Oral, resp. rate 18, height 6' 1.5" (1.867 m), weight 232 lb 12.8 oz (105.597 kg). ECOG: 1 General appearance: alert and cooperative Head: Normocephalic, without obvious abnormality, atraumatic Neck: no adenopathy Lymph nodes: Cervical, supraclavicular, and axillary nodes normal. Heart:regular rate and rhythm, S1, S2 normal, no murmur, click, rub or gallop Lung:chest clear, no wheezing, rales, normal symmetric air entry Abdomin: soft, non-tender, without masses or  organomegaly EXT:no erythema, induration, or nodules   Lab Results: Lab Results  Component Value Date   WBC 9.5 05/27/2014   HGB 12.2* 05/27/2014   HCT 36.7* 05/27/2014   MCV 114.0* 05/27/2014   PLT 131* 05/27/2014     Chemistry      Component Value Date/Time   NA 138 03/05/2014 1127   NA 139 02/06/2012 1346   K 3.8 03/05/2014 1127   K 4.0 02/06/2012 1346   CL 100 12/10/2012 1540   CL 105 02/06/2012 1346   CO2 23 03/05/2014 1127   CO2 23 02/06/2012 1346   BUN 7.3 03/05/2014 1127   BUN 12 02/06/2012 1346   CREATININE 0.8 03/05/2014 1127   CREATININE 0.90 02/06/2012 1346      Component Value Date/Time   CALCIUM 9.2 03/05/2014 1127   CALCIUM 8.8 02/06/2012 1346   ALKPHOS 118 03/05/2014 1127   ALKPHOS 95 11/04/2012 0807   AST 74* 03/05/2014 1127   AST 93* 11/04/2012 0807   ALT 25 03/05/2014 1127   ALT 37 11/04/2012 0807   BILITOT 0.71 03/05/2014 1127   BILITOT 0.9 11/04/2012 0807        Results for Allen Schroeder, Allen Schroeder (MRN [id number]) as of 05/27/2014 13:38  Ref. Range 04/21/2013 14:06 11/27/2013 14:46 03/05/2014 11:27  PSA Latest Range: <=4.00 ng/mL 0.03 0.02 0.01     Impression and Plan:  63 year old on with the following issues:  1. Castration resistant prostate cancer with metastatic disease to the bone. His continued on Xtandi with excellent response and a PSA of 0.01. My plan is to continue his current medication with the same dosing and schedule. He has no complications from this disease at this time.  2. Osteonecrosis of the jaw. It is reasonable to continue to hold Zometa to allow healing. He follows up with dental surgery in the near future.  3. Androgen deprivation: He is currently on androgen depravation. I recommend this continues for the time being.  4. Followup: Will be in 3 months for an assessment.  Allen Button, MD 10/21/20151:46 PM

## 2014-05-27 NOTE — Addendum Note (Signed)
Addended by: Randolm Idol on: 05/27/2014 01:53 PM   Modules accepted: Orders

## 2014-05-28 LAB — PSA: PSA: <0.01 ng/mL (ref <=4.00)

## 2014-05-29 ENCOUNTER — Encounter (HOSPITAL_COMMUNITY): Payer: Self-pay

## 2014-06-01 ENCOUNTER — Encounter (HOSPITAL_COMMUNITY): Payer: Self-pay

## 2014-06-03 ENCOUNTER — Encounter (HOSPITAL_COMMUNITY): Payer: Self-pay

## 2014-06-05 ENCOUNTER — Encounter (HOSPITAL_COMMUNITY)
Admission: RE | Admit: 2014-06-05 | Discharge: 2014-06-05 | Disposition: A | Discharge status: Home / Self Care | Payer: Self-pay | Source: Ambulatory Visit | Attending: Cardiovascular Disease | Admitting: Cardiovascular Disease

## 2014-06-08 ENCOUNTER — Encounter (HOSPITAL_COMMUNITY): Payer: Self-pay

## 2014-06-08 DIAGNOSIS — Z7902 Long term (current) use of antithrombotics/antiplatelets: Secondary | ICD-10-CM | POA: Insufficient documentation

## 2014-06-08 DIAGNOSIS — I10 Essential (primary) hypertension: Secondary | ICD-10-CM | POA: Insufficient documentation

## 2014-06-08 DIAGNOSIS — Z5189 Encounter for other specified aftercare: Secondary | ICD-10-CM | POA: Insufficient documentation

## 2014-06-08 DIAGNOSIS — E785 Hyperlipidemia, unspecified: Secondary | ICD-10-CM | POA: Insufficient documentation

## 2014-06-08 DIAGNOSIS — I4891 Unspecified atrial fibrillation: Secondary | ICD-10-CM | POA: Insufficient documentation

## 2014-06-10 ENCOUNTER — Encounter (HOSPITAL_COMMUNITY): Payer: Self-pay

## 2014-06-12 ENCOUNTER — Encounter (HOSPITAL_COMMUNITY)
Admission: RE | Admit: 2014-06-12 | Discharge: 2014-06-12 | Disposition: A | Discharge status: Home / Self Care | Payer: Self-pay | Source: Ambulatory Visit | Attending: Cardiovascular Disease | Admitting: Cardiovascular Disease

## 2014-06-15 ENCOUNTER — Encounter (HOSPITAL_COMMUNITY): Payer: Self-pay

## 2014-06-17 ENCOUNTER — Encounter (HOSPITAL_COMMUNITY)
Admission: RE | Admit: 2014-06-17 | Discharge: 2014-06-17 | Disposition: A | Discharge status: Home / Self Care | Payer: Self-pay | Source: Ambulatory Visit | Attending: Cardiovascular Disease | Admitting: Cardiovascular Disease

## 2014-06-19 ENCOUNTER — Encounter (HOSPITAL_COMMUNITY)
Admission: RE | Admit: 2014-06-19 | Discharge: 2014-06-19 | Disposition: A | Discharge status: Home / Self Care | Payer: Self-pay | Source: Ambulatory Visit | Attending: Cardiovascular Disease | Admitting: Cardiovascular Disease

## 2014-06-22 ENCOUNTER — Ambulatory Visit (INDEPENDENT_AMBULATORY_CARE_PROVIDER_SITE_OTHER): Payer: Medicare Other | Admitting: Cardiovascular Disease

## 2014-06-22 ENCOUNTER — Encounter (HOSPITAL_COMMUNITY)
Admission: RE | Admit: 2014-06-22 | Discharge: 2014-06-22 | Disposition: A | Discharge status: Home / Self Care | Payer: Self-pay | Source: Ambulatory Visit | Attending: Cardiovascular Disease | Admitting: Cardiovascular Disease

## 2014-06-22 ENCOUNTER — Encounter: Payer: Self-pay | Admitting: Cardiovascular Disease

## 2014-06-22 VITALS — BP 98/70 | HR 74 | Ht 74.0 in | Wt 231.1 lb

## 2014-06-22 DIAGNOSIS — I10 Essential (primary) hypertension: Secondary | ICD-10-CM

## 2014-06-22 DIAGNOSIS — E785 Hyperlipidemia, unspecified: Secondary | ICD-10-CM

## 2014-06-22 DIAGNOSIS — I4891 Unspecified atrial fibrillation: Secondary | ICD-10-CM

## 2014-06-22 MED ORDER — NITROGLYCERIN 0.4 MG SL SUBL: 0.4000 mg | SUBLINGUAL_TABLET | SUBLINGUAL | Status: DC | PRN

## 2014-06-22 MED ORDER — ROSUVASTATIN CALCIUM 20 MG PO TABS: ORAL_TABLET | ORAL | Status: DC

## 2014-06-22 MED ORDER — SOTALOL HCL 120 MG PO TABS: ORAL_TABLET | ORAL | Status: DC

## 2014-06-22 MED ORDER — CLOPIDOGREL BISULFATE 75 MG PO TABS: ORAL_TABLET | ORAL | Status: DC

## 2014-06-22 MED ORDER — FOLIC ACID 1 MG PO TABS: ORAL_TABLET | ORAL | Status: DC

## 2014-06-22 MED ORDER — METOPROLOL SUCCINATE ER 25 MG PO TB24: ORAL_TABLET | ORAL | Status: DC

## 2014-06-22 NOTE — Patient Instructions (Signed)
Your physician wants you to follow-up in: 1 YEAR with Dr Burt Knack.  You will receive a reminder letter in the mail two months in advance. If you don't receive a letter, please call our office to schedule the follow-up appointment.  Your physician recommends that you have a FASTING LIPID profile with your next blood draw at the [facility]--nothing to eat or drink after midnight.   Your physician recommends that you continue on your current medications as directed. Please refer to the Current Medication list given to you today.

## 2014-06-22 NOTE — Progress Notes (Signed)
Background: The patient is followed for atrial fibrillation and coronary artery disease. He has maintained sinus rhythm on antiarrhythmic therapy with sotalol. He is not anticoagulated because of bleeding risk. The patient has metastatic prostate cancer. He also has alcohol-related chronic liver disease.  HPI:  63 year-old gentleman presenting for follow-up evaluation. He has no interval cardiac complaints since I saw him one year ago. He denies chest pain, chest pressure, shortness of breath, lightheadedness, palpitations, or edema. He reports gait instability and peripheral neuropathy.   Outpatient Encounter Prescriptions as of 06/22/2014  Medication Sig  . CALCIUM CARB-CHOLECALCIFEROL PO Take 1 capsule by mouth daily.   . clopidogrel (PLAVIX) 75 MG tablet TAKE ONE TABLET BY MOUTH ONCE DAILY  . diphenhydrAMINE (BENADRYL) 25 MG tablet Take 25 mg by mouth every 6 (six) hours as needed.    . folic acid (FOLVITE) 1 MG tablet Take one tablet by mouth once a day....  [name] metoprolol succinate (TOPROL-XL) 25 MG 24 hr tablet TAKE ONE TABLET BY MOUTH ONCE DAILY  . Multiple Vitamin (MULTIVITAMIN) tablet Take 1 tablet by mouth daily.    . nitroGLYCERIN (NITROSTAT) 0.4 MG SL tablet Place 1 tablet (0.4 mg total) under the tongue every 5 (five) minutes as needed.  . rosuvastatin (CRESTOR) 20 MG tablet TAKE ONE TABLET BY MOUTH ONCE DAILY  . sotalol (BETAPACE) 120 MG tablet TAKE ONE TABLET TWICE DAILY  . UNKNOWN TO PATIENT Slow release hormone implant placed 8/13 by Dr. Willette Cluster.  Good for one year  . XTANDI 40 MG capsule TAKE 4 CAPSULES BY MOUTH DAILY  . [DISCONTINUED] clopidogrel (PLAVIX) 75 MG tablet TAKE ONE TABLET BY MOUTH ONCE DAILY  . [DISCONTINUED] folic acid (FOLVITE) 1 MG tablet Take one tablet by mouth once a day....  . [DISCONTINUED] metoprolol succinate (TOPROL-XL) 25 MG 24 hr tablet TAKE ONE TABLET BY MOUTH ONCE DAILY  . [DISCONTINUED] nitroGLYCERIN (NITROSTAT) 0.4 MG SL tablet Place 1  tablet (0.4 mg total) under the tongue every 5 (five) minutes as needed.  . [DISCONTINUED] rosuvastatin (CRESTOR) 20 MG tablet TAKE ONE TABLET BY MOUTH ONCE DAILY  . [DISCONTINUED] sotalol (BETAPACE) 120 MG tablet TAKE ONE TABLET TWICE DAILY  . [DISCONTINUED] cefdinir (OMNICEF) 300 MG capsule Take 1 capsule (300 mg total) by mouth 2 (two) times daily.  . [DISCONTINUED] PRESCRIPTION MEDICATION Chlorhexidene rinse bid orally    No Known Allergies  Past Medical History  Diagnosis Date  . Atrial fibrillation   . Other and unspecified hyperlipidemia   . Unspecified essential hypertension   . Malignant neoplasm of prostate 11/2007    Mets to Bone  . Elevated prostate specific antigen (PSA)   . Personal history of colonic polyps   . Hypocalcemia   . Acute alcoholic hepatitis   . Essential and other specified forms of tremor   . Anxiety state, unspecified   . Family history of malignant neoplasm of gastrointestinal tract   . Alcohol abuse with physiological dependence 02/13/2012  . 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   ROS: Negative except as per HPI  BP 98/70 mmHg  Pulse 74  Ht 6\' 2"  (1.88 m)  Wt 231 lb 1.9 oz (104.835 kg)  BMI 29.66 kg/m2  PHYSICAL EXAM: Pt is alert and oriented, overweight male  in NAD HEENT: normal Neck: JVP - normal, carotids 2+= without bruits Lungs: CTA bilaterally CV: RRR without murmur or gallop Abd: soft, NT, Positive BS, no hepatomegaly Ext: no C/C/E, distal pulses intact and equal Skin: warm/dry no rash  EKG:  NSR, nonspecific ST abnormality  ASSESSMENT AND PLAN: 1. CAD, native vessel, without symptoms of angina. The patient is maintained on antiplatelet Rx with plavix alone. He has done well with this with no  cardiac-related problems since his initial PCI procedure for a 'ruptured plaque' in the LAD several years ago.  2. PAF - continues on sotalol for rhythm control and he appears to be maintaining sinus rhythm. Not a candidate for anticoagulation.  3. Alcoholism - discussed LFT pattern with patient and this indicates alcohol related liver disease with primary elevation of AST. He basically self-medicates with alcohol for chronic pain and anxiety. He understands need to cut down and/or quit but I think highly unlikely.  4. Hyperlipidemia - check lipids with next bloodwork. Continues on rosuvastatin.  Sherren Mocha, MD 06/22/2014 1:42 PM

## 2014-06-24 ENCOUNTER — Encounter (HOSPITAL_COMMUNITY): Payer: Self-pay

## 2014-06-26 ENCOUNTER — Encounter (HOSPITAL_COMMUNITY)
Admission: RE | Admit: 2014-06-26 | Discharge: 2014-06-26 | Disposition: A | Discharge status: Home / Self Care | Payer: Self-pay | Source: Ambulatory Visit | Attending: Cardiovascular Disease | Admitting: Cardiovascular Disease

## 2014-06-29 ENCOUNTER — Encounter (HOSPITAL_COMMUNITY)
Admission: RE | Admit: 2014-06-29 | Discharge: 2014-06-29 | Disposition: A | Discharge status: Home / Self Care | Payer: Self-pay | Source: Ambulatory Visit | Attending: Cardiovascular Disease | Admitting: Cardiovascular Disease

## 2014-07-01 ENCOUNTER — Encounter (HOSPITAL_COMMUNITY): Payer: Self-pay

## 2014-07-06 ENCOUNTER — Other Ambulatory Visit: Payer: Self-pay | Admitting: Oncology

## 2014-07-06 ENCOUNTER — Encounter (HOSPITAL_COMMUNITY): Payer: Self-pay

## 2014-07-08 ENCOUNTER — Encounter (HOSPITAL_COMMUNITY): Payer: Medicare Other

## 2014-07-08 DIAGNOSIS — Z7902 Long term (current) use of antithrombotics/antiplatelets: Secondary | ICD-10-CM | POA: Insufficient documentation

## 2014-07-08 DIAGNOSIS — Z5189 Encounter for other specified aftercare: Secondary | ICD-10-CM | POA: Insufficient documentation

## 2014-07-08 DIAGNOSIS — I10 Essential (primary) hypertension: Secondary | ICD-10-CM | POA: Insufficient documentation

## 2014-07-08 DIAGNOSIS — E785 Hyperlipidemia, unspecified: Secondary | ICD-10-CM | POA: Insufficient documentation

## 2014-07-08 DIAGNOSIS — I4891 Unspecified atrial fibrillation: Secondary | ICD-10-CM | POA: Insufficient documentation

## 2014-07-10 ENCOUNTER — Encounter (HOSPITAL_COMMUNITY)
Admission: RE | Admit: 2014-07-10 | Discharge: 2014-07-10 | Disposition: A | Discharge status: Home / Self Care | Payer: Self-pay | Source: Ambulatory Visit | Attending: Cardiovascular Disease | Admitting: Cardiovascular Disease

## 2014-07-13 ENCOUNTER — Encounter (HOSPITAL_COMMUNITY)
Admission: RE | Admit: 2014-07-13 | Discharge: 2014-07-13 | Disposition: A | Discharge status: Home / Self Care | Payer: Self-pay | Source: Ambulatory Visit | Attending: Cardiovascular Disease | Admitting: Cardiovascular Disease

## 2014-07-15 ENCOUNTER — Encounter (HOSPITAL_COMMUNITY): Payer: Self-pay

## 2014-07-17 ENCOUNTER — Encounter (HOSPITAL_COMMUNITY)
Admission: RE | Admit: 2014-07-17 | Discharge: 2014-07-17 | Disposition: A | Discharge status: Home / Self Care | Payer: Self-pay | Source: Ambulatory Visit | Attending: Cardiovascular Disease | Admitting: Cardiovascular Disease

## 2014-07-20 ENCOUNTER — Encounter (HOSPITAL_COMMUNITY): Payer: Self-pay

## 2014-07-22 ENCOUNTER — Encounter (HOSPITAL_COMMUNITY): Payer: Self-pay

## 2014-07-24 ENCOUNTER — Encounter (HOSPITAL_COMMUNITY): Payer: Self-pay

## 2014-07-27 ENCOUNTER — Encounter (HOSPITAL_COMMUNITY): Payer: Self-pay

## 2014-07-29 ENCOUNTER — Encounter (HOSPITAL_COMMUNITY): Payer: Self-pay

## 2014-08-03 ENCOUNTER — Encounter (HOSPITAL_COMMUNITY): Payer: Self-pay

## 2014-08-04 ENCOUNTER — Telehealth: Payer: Self-pay | Admitting: Oncology

## 2014-08-04 ENCOUNTER — Other Ambulatory Visit: Payer: Self-pay | Admitting: Urology

## 2014-08-04 DIAGNOSIS — C61 Malignant neoplasm of prostate: Secondary | ICD-10-CM

## 2014-08-04 DIAGNOSIS — E559 Vitamin D deficiency, unspecified: Secondary | ICD-10-CM

## 2014-08-04 NOTE — Telephone Encounter (Signed)
moved 1/19 appt to AM. lmonvm for pt on preferred number and mailed schedule.

## 2014-08-05 ENCOUNTER — Encounter (HOSPITAL_COMMUNITY): Payer: Self-pay

## 2014-08-10 ENCOUNTER — Encounter (HOSPITAL_COMMUNITY)
Admission: RE | Admit: 2014-08-10 | Discharge: 2014-08-10 | Disposition: A | Discharge status: Home / Self Care | Payer: Self-pay | Source: Ambulatory Visit | Attending: Cardiovascular Disease | Admitting: Cardiovascular Disease

## 2014-08-10 DIAGNOSIS — E785 Hyperlipidemia, unspecified: Secondary | ICD-10-CM | POA: Insufficient documentation

## 2014-08-10 DIAGNOSIS — I4891 Unspecified atrial fibrillation: Secondary | ICD-10-CM | POA: Insufficient documentation

## 2014-08-10 DIAGNOSIS — I10 Essential (primary) hypertension: Secondary | ICD-10-CM | POA: Insufficient documentation

## 2014-08-10 DIAGNOSIS — Z7902 Long term (current) use of antithrombotics/antiplatelets: Secondary | ICD-10-CM | POA: Insufficient documentation

## 2014-08-10 DIAGNOSIS — Z5189 Encounter for other specified aftercare: Secondary | ICD-10-CM | POA: Insufficient documentation

## 2014-08-12 ENCOUNTER — Encounter (HOSPITAL_COMMUNITY): Payer: Self-pay

## 2014-08-14 ENCOUNTER — Encounter (HOSPITAL_COMMUNITY): Payer: Self-pay

## 2014-08-17 ENCOUNTER — Encounter (HOSPITAL_COMMUNITY)
Admission: RE | Admit: 2014-08-17 | Discharge: 2014-08-17 | Disposition: A | Discharge status: Home / Self Care | Payer: Self-pay | Source: Ambulatory Visit | Attending: Cardiovascular Disease | Admitting: Cardiovascular Disease

## 2014-08-19 ENCOUNTER — Encounter (HOSPITAL_COMMUNITY): Payer: Self-pay

## 2014-08-21 ENCOUNTER — Encounter (HOSPITAL_COMMUNITY): Payer: Self-pay

## 2014-08-24 ENCOUNTER — Encounter (HOSPITAL_COMMUNITY): Payer: Self-pay

## 2014-08-25 ENCOUNTER — Encounter: Payer: Self-pay | Admitting: Cardiovascular Disease

## 2014-08-25 ENCOUNTER — Ambulatory Visit (HOSPITAL_BASED_OUTPATIENT_CLINIC_OR_DEPARTMENT_OTHER): Payer: Medicare Other | Admitting: Oncology

## 2014-08-25 ENCOUNTER — Telehealth: Payer: Self-pay | Admitting: Oncology

## 2014-08-25 ENCOUNTER — Other Ambulatory Visit (HOSPITAL_BASED_OUTPATIENT_CLINIC_OR_DEPARTMENT_OTHER): Payer: Medicare Other

## 2014-08-25 VITALS — BP 136/78 | HR 85 | Temp 98.8°F | Resp 18 | Ht 74.0 in | Wt 231.7 lb

## 2014-08-25 DIAGNOSIS — E785 Hyperlipidemia, unspecified: Secondary | ICD-10-CM | POA: Diagnosis not present

## 2014-08-25 DIAGNOSIS — E291 Testicular hypofunction: Secondary | ICD-10-CM

## 2014-08-25 DIAGNOSIS — M879 Osteonecrosis, unspecified: Secondary | ICD-10-CM

## 2014-08-25 DIAGNOSIS — C61 Malignant neoplasm of prostate: Secondary | ICD-10-CM

## 2014-08-25 DIAGNOSIS — C7951 Secondary malignant neoplasm of bone: Secondary | ICD-10-CM

## 2014-08-25 DIAGNOSIS — I4891 Unspecified atrial fibrillation: Secondary | ICD-10-CM | POA: Diagnosis not present

## 2014-08-25 LAB — COMPREHENSIVE METABOLIC PANEL (CC13)
ALK PHOS: 142 U/L (ref 40–150)
ALT: 25 U/L (ref 0–55)
AST: 92 U/L — AB (ref 5–34)
Albumin: 3.3 g/dL — ABNORMAL LOW (ref 3.5–5.0)
Anion Gap: 11 mEq/L (ref 3–11)
BUN: 8.2 mg/dL (ref 7.0–26.0)
CALCIUM: 8.4 mg/dL (ref 8.4–10.4)
CHLORIDE: 100 meq/L (ref 98–109)
CO2: 23 meq/L (ref 22–29)
Creatinine: 0.8 mg/dL (ref 0.7–1.3)
EGFR: >90 mL/min/{1.73_m2} (ref >90)
Glucose: 107 mg/dl (ref 70–140)
Potassium: 3.4 mEq/L — ABNORMAL LOW (ref 3.5–5.1)
Sodium: 134 mEq/L — ABNORMAL LOW (ref 136–145)
Total Bilirubin: 2.23 mg/dL — ABNORMAL HIGH (ref 0.20–1.20)
Total Protein: 8.3 g/dL (ref 6.4–8.3)

## 2014-08-25 LAB — CBC WITH DIFFERENTIAL/PLATELET
BASO%: 0.8 % (ref 0.0–2.0)
BASOS ABS: 0.1 10*3/uL (ref 0.0–0.1)
EOS ABS: 0.2 10*3/uL (ref 0.0–0.5)
EOS%: 2.5 % (ref 0.0–7.0)
HCT: 38.2 % — ABNORMAL LOW (ref 38.4–49.9)
HEMOGLOBIN: 12.4 g/dL — AB (ref 13.0–17.1)
LYMPH#: 1.1 10*3/uL (ref 0.9–3.3)
LYMPH%: 12.3 % — ABNORMAL LOW (ref 14.0–49.0)
MCH: 38.3 pg — ABNORMAL HIGH (ref 27.2–33.4)
MCHC: 32.5 g/dL (ref 32.0–36.0)
MCV: 117.8 fL — AB (ref 79.3–98.0)
MONO#: 0.8 10*3/uL (ref 0.1–0.9)
MONO%: 8.7 % (ref 0.0–14.0)
NEUT%: 75.7 % — ABNORMAL HIGH (ref 39.0–75.0)
NEUTROS ABS: 6.8 10*3/uL — AB (ref 1.5–6.5)
PLATELETS: 118 10*3/uL — AB (ref 140–400)
RBC: 3.25 10*6/uL — ABNORMAL LOW (ref 4.20–5.82)
RDW: 16 % — AB (ref 11.0–14.6)
WBC: 9 10*3/uL (ref 4.0–10.3)

## 2014-08-25 NOTE — Telephone Encounter (Signed)
Pt confirmed labs/ov per 01/19 POF, gave pt AVS.... KJ °

## 2014-08-25 NOTE — Progress Notes (Signed)
Hematology and Oncology Follow Up Visit  Allen Schroeder [id number] November 25, 1950 64 y.o. 08/25/2014 11:40 AM Allen Schroeder, MDPatterson, Loralee Pacas, MD   Principle Diagnosis: 64 year old gentleman with castration resistant prostate cancer with metastatic disease to the bone. He was initially diagnosed in October of 2009 with bony metastasis.   Prior Therapy: He was treated with hormonal therapy with monthly Fermagon injections and monthly Zometa infusions. PSA fell from 72.9 in October 2009 to a nadir of 0.24 by June of 2012.  PSA began to rise again and was 4.09 on 02/06/2012. Bone scan at that point did not show any obvious new lesions. He was started on Xtandi and once again has had a very nice response since.   Current therapy: Xtandi 160 mg daily started September of 2013 with an excellent response PSA is close to 0.   Interim History:  [name] presents today for a followup visit. Since his last visit, he reports no new complaints. He had been doing very well on Xtandi without any problems. He does report intermittent neuropathy which has not changed. Has not reported any nausea or vomiting or abdominal pain. Has not reported any neurological symptoms such as syncope or seizures.. He has not reported any back pain shoulder pain or pathological fractures. He continues to perform activities of daily without any hindrance or decline. Has not reported any lower extremity edema or recent hospitalization. Has not reported any genitourinary complaints, petechiae or lymphadenopathy.  The rest of his review of systems unremarkable.   Medications: I have reviewed the patient's current medications.  Current Outpatient Prescriptions  Medication Sig Dispense Refill  . CALCIUM CARB-CHOLECALCIFEROL PO Take 1 capsule by mouth daily.     . clopidogrel (PLAVIX) 75 MG tablet TAKE ONE TABLET BY MOUTH ONCE DAILY 30 tablet 11  . diphenhydrAMINE (BENADRYL) 25 MG tablet Take 25 mg by mouth every 6 (six) hours as  needed.      . folic acid (FOLVITE) 1 MG tablet Take one tablet by mouth once a day.... 30 tablet 6  . metoprolol succinate (TOPROL-XL) 25 MG 24 hr tablet TAKE ONE TABLET BY MOUTH ONCE DAILY 30 tablet 11  . Multiple Vitamin (MULTIVITAMIN) tablet Take 1 tablet by mouth daily.      . nitroGLYCERIN (NITROSTAT) 0.4 MG SL tablet Place 1 tablet (0.4 mg total) under the tongue every 5 (five) minutes as needed. 25 tablet 3  . rosuvastatin (CRESTOR) 20 MG tablet TAKE ONE TABLET BY MOUTH ONCE DAILY 30 tablet 11  . sotalol (BETAPACE) 120 MG tablet TAKE ONE TABLET TWICE DAILY 60 tablet 11  . UNKNOWN TO PATIENT Slow release hormone implant placed 8/13 by Dr. Willette Cluster.  Good for one year    . XTANDI 40 MG capsule TAKE 4 CAPSULES BY MOUTH DAILY 120 capsule 1   No current facility-administered medications for this visit.     Allergies: No Known Allergies  Past Medical History, Surgical history, Social history, and Family History were reviewed and updated.   Physical Exam: Blood pressure 136/78, pulse 85, temperature 98.8 F (37.1 C), temperature source Oral, resp. rate 18, height 6\' 2"  (1.88 m), weight 231 lb 11.2 oz (105.098 kg), SpO2 97 %. ECOG: 1 General appearance: alert and cooperative Head: Normocephalic, without obvious abnormality, atraumatic Neck: no adenopathy Lymph nodes: Cervical, supraclavicular, and axillary nodes normal. Heart:regular rate and rhythm, S1, S2 normal, no murmur, click, rub or gallop Lung:chest clear, no wheezing, rales, normal symmetric air entry Abdomin: soft, non-tender, without masses  or organomegaly EXT:no erythema, induration, or nodules   Lab Results: Lab Results  Component Value Date   WBC 9.0 08/25/2014   HGB 12.4* 08/25/2014   HCT 38.2* 08/25/2014   MCV 117.8* 08/25/2014   PLT 118* 08/25/2014     Chemistry      Component Value Date/Time   NA 142 05/27/2014 1326   NA 139 02/06/2012 1346   K 3.9 05/27/2014 1326   K 4.0 02/06/2012 1346   CL  100 12/10/2012 1540   CL 105 02/06/2012 1346   CO2 27 05/27/2014 1326   CO2 23 02/06/2012 1346   BUN 8.7 05/27/2014 1326   BUN 12 02/06/2012 1346   CREATININE 0.8 05/27/2014 1326   CREATININE 0.90 02/06/2012 1346      Component Value Date/Time   CALCIUM 9.4 05/27/2014 1326   CALCIUM 8.8 02/06/2012 1346   ALKPHOS 129 05/27/2014 1326   ALKPHOS 95 11/04/2012 0807   AST 92* 05/27/2014 1326   AST 93* 11/04/2012 0807   ALT 27 05/27/2014 1326   ALT 37 11/04/2012 0807   BILITOT 0.71 05/27/2014 1326   BILITOT 0.9 11/04/2012 0807        Results for Allen Schroeder (MRN [id number]) as of 08/25/2014 11:43  Ref. Range 11/27/2013 14:46 03/05/2014 11:27 05/27/2014 13:25  PSA Latest Range: <=4.00 ng/mL 0.02 0.01 <0.01      Impression and Plan:  64 year old on with the following issues:  1. Castration resistant prostate cancer with metastatic disease to the bone. His continued on Xtandi with excellent response and a PSA of less than 0.01. My plan is to continue his current medication with the same dosing and schedule. He has no complications from this disease at this time. I do not see any need to change his current regimen.  2. Osteonecrosis of the jaw. It is reasonable to continue to hold Zometa to allow healing. He follows up with dental surgery in the near future.  3. Androgen deprivation: He is currently on androgen depravation. I recommend this continues for the time being.   4. Bone health: He has a bone density scheduled in the near future.  5. Followup: Will be in 3 months for an assessment.  [facility], MD 1/19/201611:40 AM

## 2014-08-26 ENCOUNTER — Encounter (HOSPITAL_COMMUNITY): Payer: Self-pay

## 2014-08-26 LAB — PSA

## 2014-08-28 ENCOUNTER — Encounter (HOSPITAL_COMMUNITY): Payer: Self-pay

## 2014-08-31 ENCOUNTER — Encounter (HOSPITAL_COMMUNITY): Payer: Self-pay

## 2014-09-01 ENCOUNTER — Ambulatory Visit
Admission: RE | Admit: 2014-09-01 | Discharge: 2014-09-01 | Disposition: A | Discharge status: Home / Self Care | Payer: Medicare Other | Source: Ambulatory Visit | Attending: Urology | Admitting: Urology

## 2014-09-01 DIAGNOSIS — E559 Vitamin D deficiency, unspecified: Secondary | ICD-10-CM

## 2014-09-01 DIAGNOSIS — C61 Malignant neoplasm of prostate: Secondary | ICD-10-CM

## 2014-09-01 DIAGNOSIS — Z13820 Encounter for screening for osteoporosis: Secondary | ICD-10-CM | POA: Diagnosis not present

## 2014-09-02 ENCOUNTER — Encounter (HOSPITAL_COMMUNITY): Payer: Self-pay

## 2014-09-04 ENCOUNTER — Encounter (HOSPITAL_COMMUNITY): Payer: Self-pay

## 2014-09-09 ENCOUNTER — Other Ambulatory Visit: Payer: Self-pay | Admitting: Oncology

## 2014-09-11 ENCOUNTER — Encounter (HOSPITAL_COMMUNITY): Payer: Self-pay

## 2014-09-11 DIAGNOSIS — Z5189 Encounter for other specified aftercare: Secondary | ICD-10-CM | POA: Insufficient documentation

## 2014-09-11 DIAGNOSIS — E785 Hyperlipidemia, unspecified: Secondary | ICD-10-CM | POA: Insufficient documentation

## 2014-09-11 DIAGNOSIS — I4891 Unspecified atrial fibrillation: Secondary | ICD-10-CM | POA: Insufficient documentation

## 2014-09-11 DIAGNOSIS — I10 Essential (primary) hypertension: Secondary | ICD-10-CM | POA: Insufficient documentation

## 2014-09-11 DIAGNOSIS — Z7902 Long term (current) use of antithrombotics/antiplatelets: Secondary | ICD-10-CM | POA: Insufficient documentation

## 2014-09-14 ENCOUNTER — Encounter (HOSPITAL_COMMUNITY)
Admission: RE | Admit: 2014-09-14 | Discharge: 2014-09-14 | Disposition: A | Discharge status: Home / Self Care | Payer: Self-pay | Source: Ambulatory Visit | Attending: Cardiovascular Disease | Admitting: Cardiovascular Disease

## 2014-09-16 ENCOUNTER — Encounter (HOSPITAL_COMMUNITY): Payer: Self-pay

## 2014-09-18 ENCOUNTER — Encounter (HOSPITAL_COMMUNITY)
Admission: RE | Admit: 2014-09-18 | Discharge: 2014-09-18 | Disposition: A | Discharge status: Home / Self Care | Payer: Self-pay | Source: Ambulatory Visit | Attending: Cardiovascular Disease | Admitting: Cardiovascular Disease

## 2014-09-21 ENCOUNTER — Encounter (HOSPITAL_COMMUNITY): Payer: Self-pay

## 2014-09-23 ENCOUNTER — Encounter (HOSPITAL_COMMUNITY): Payer: Self-pay

## 2014-09-25 ENCOUNTER — Encounter (HOSPITAL_COMMUNITY)
Admission: RE | Admit: 2014-09-25 | Discharge: 2014-09-25 | Disposition: A | Discharge status: Home / Self Care | Payer: Self-pay | Source: Ambulatory Visit | Attending: Cardiovascular Disease | Admitting: Cardiovascular Disease

## 2014-09-28 ENCOUNTER — Encounter (HOSPITAL_COMMUNITY)
Admission: RE | Admit: 2014-09-28 | Discharge: 2014-09-28 | Disposition: A | Discharge status: Home / Self Care | Payer: Self-pay | Source: Ambulatory Visit | Attending: Cardiovascular Disease | Admitting: Cardiovascular Disease

## 2014-09-30 ENCOUNTER — Encounter (HOSPITAL_COMMUNITY): Payer: Self-pay

## 2014-10-02 ENCOUNTER — Encounter (HOSPITAL_COMMUNITY): Payer: Self-pay

## 2014-10-05 ENCOUNTER — Encounter (HOSPITAL_COMMUNITY): Payer: Self-pay

## 2014-10-07 ENCOUNTER — Encounter (HOSPITAL_COMMUNITY): Payer: Medicare Other

## 2014-10-07 DIAGNOSIS — Z5189 Encounter for other specified aftercare: Secondary | ICD-10-CM | POA: Insufficient documentation

## 2014-10-07 DIAGNOSIS — I10 Essential (primary) hypertension: Secondary | ICD-10-CM | POA: Insufficient documentation

## 2014-10-07 DIAGNOSIS — E785 Hyperlipidemia, unspecified: Secondary | ICD-10-CM | POA: Insufficient documentation

## 2014-10-07 DIAGNOSIS — Z7902 Long term (current) use of antithrombotics/antiplatelets: Secondary | ICD-10-CM | POA: Insufficient documentation

## 2014-10-07 DIAGNOSIS — I4891 Unspecified atrial fibrillation: Secondary | ICD-10-CM | POA: Insufficient documentation

## 2014-10-09 ENCOUNTER — Encounter (HOSPITAL_COMMUNITY)
Admission: RE | Admit: 2014-10-09 | Discharge: 2014-10-09 | Disposition: A | Discharge status: Home / Self Care | Payer: Self-pay | Source: Ambulatory Visit | Attending: Cardiovascular Disease | Admitting: Cardiovascular Disease

## 2014-10-12 ENCOUNTER — Encounter (HOSPITAL_COMMUNITY): Payer: Self-pay

## 2014-10-14 ENCOUNTER — Encounter (HOSPITAL_COMMUNITY): Payer: Self-pay

## 2014-10-16 ENCOUNTER — Encounter (HOSPITAL_COMMUNITY)
Admission: RE | Admit: 2014-10-16 | Discharge: 2014-10-16 | Disposition: A | Discharge status: Home / Self Care | Payer: Self-pay | Source: Ambulatory Visit | Attending: Cardiovascular Disease | Admitting: Cardiovascular Disease

## 2014-10-19 ENCOUNTER — Encounter (HOSPITAL_COMMUNITY): Payer: Self-pay

## 2014-10-21 ENCOUNTER — Encounter (HOSPITAL_COMMUNITY): Payer: Self-pay

## 2014-10-23 ENCOUNTER — Encounter (HOSPITAL_COMMUNITY): Payer: Self-pay

## 2014-10-26 ENCOUNTER — Encounter (HOSPITAL_COMMUNITY): Payer: Self-pay

## 2014-10-28 ENCOUNTER — Encounter (HOSPITAL_COMMUNITY): Payer: Self-pay

## 2014-10-30 ENCOUNTER — Encounter (HOSPITAL_COMMUNITY)
Admission: RE | Admit: 2014-10-30 | Discharge: 2014-10-30 | Disposition: A | Discharge status: Home / Self Care | Payer: Self-pay | Source: Ambulatory Visit | Attending: Cardiovascular Disease | Admitting: Cardiovascular Disease

## 2014-11-02 ENCOUNTER — Encounter (HOSPITAL_COMMUNITY): Admission: RE | Admit: 2014-11-02 | Payer: Self-pay | Source: Ambulatory Visit

## 2014-11-04 ENCOUNTER — Encounter (HOSPITAL_COMMUNITY): Payer: Self-pay

## 2014-11-05 ENCOUNTER — Other Ambulatory Visit: Payer: Self-pay | Admitting: Oncology

## 2014-11-05 ENCOUNTER — Telehealth: Payer: Self-pay | Admitting: Oncology

## 2014-11-05 DIAGNOSIS — C61 Malignant neoplasm of prostate: Secondary | ICD-10-CM

## 2014-11-05 NOTE — Telephone Encounter (Signed)
s.w. pt and advised on 4.5 appt....pt ok and aware

## 2014-11-06 ENCOUNTER — Encounter (HOSPITAL_COMMUNITY): Payer: Medicare Other | Attending: Cardiovascular Disease

## 2014-11-06 DIAGNOSIS — I4891 Unspecified atrial fibrillation: Secondary | ICD-10-CM | POA: Insufficient documentation

## 2014-11-06 DIAGNOSIS — I10 Essential (primary) hypertension: Secondary | ICD-10-CM | POA: Insufficient documentation

## 2014-11-06 DIAGNOSIS — E785 Hyperlipidemia, unspecified: Secondary | ICD-10-CM | POA: Insufficient documentation

## 2014-11-06 DIAGNOSIS — Z7902 Long term (current) use of antithrombotics/antiplatelets: Secondary | ICD-10-CM | POA: Insufficient documentation

## 2014-11-06 DIAGNOSIS — Z5189 Encounter for other specified aftercare: Secondary | ICD-10-CM | POA: Insufficient documentation

## 2014-11-09 ENCOUNTER — Encounter (HOSPITAL_COMMUNITY): Payer: Self-pay

## 2014-11-10 ENCOUNTER — Other Ambulatory Visit (HOSPITAL_BASED_OUTPATIENT_CLINIC_OR_DEPARTMENT_OTHER): Payer: Medicare Other

## 2014-11-10 ENCOUNTER — Ambulatory Visit (HOSPITAL_BASED_OUTPATIENT_CLINIC_OR_DEPARTMENT_OTHER): Payer: Medicare Other | Admitting: Oncology

## 2014-11-10 ENCOUNTER — Telehealth: Payer: Self-pay | Admitting: Oncology

## 2014-11-10 VITALS — BP 109/77 | HR 71 | Temp 98.2°F | Resp 18 | Ht 74.0 in | Wt 230.7 lb

## 2014-11-10 DIAGNOSIS — C7951 Secondary malignant neoplasm of bone: Secondary | ICD-10-CM

## 2014-11-10 DIAGNOSIS — C61 Malignant neoplasm of prostate: Secondary | ICD-10-CM

## 2014-11-10 DIAGNOSIS — R17 Unspecified jaundice: Secondary | ICD-10-CM | POA: Diagnosis not present

## 2014-11-10 LAB — CBC WITH DIFFERENTIAL/PLATELET
BASO%: 0.4 % (ref 0.0–2.0)
BASOS ABS: 0.1 10*3/uL (ref 0.0–0.1)
EOS%: 3.2 % (ref 0.0–7.0)
Eosinophils Absolute: 0.4 10*3/uL (ref 0.0–0.5)
HEMATOCRIT: 34.1 % — AB (ref 38.4–49.9)
HEMOGLOBIN: 11.7 g/dL — AB (ref 13.0–17.1)
LYMPH%: 13.9 % — AB (ref 14.0–49.0)
MCH: 40.1 pg — AB (ref 27.2–33.4)
MCHC: 34.3 g/dL (ref 32.0–36.0)
MCV: 116.8 fL — ABNORMAL HIGH (ref 79.3–98.0)
MONO#: 0.8 10*3/uL (ref 0.1–0.9)
MONO%: 6.9 % (ref 0.0–14.0)
NEUT#: 8.8 10*3/uL — ABNORMAL HIGH (ref 1.5–6.5)
NEUT%: 75.6 % — ABNORMAL HIGH (ref 39.0–75.0)
PLATELETS: 113 10*3/uL — AB (ref 140–400)
RBC: 2.92 10*6/uL — ABNORMAL LOW (ref 4.20–5.82)
RDW: 17.8 % — ABNORMAL HIGH (ref 11.0–14.6)
WBC: 11.7 10*3/uL — ABNORMAL HIGH (ref 4.0–10.3)
lymph#: 1.6 10*3/uL (ref 0.9–3.3)
nRBC: 0 % (ref 0–0)

## 2014-11-10 LAB — COMPREHENSIVE METABOLIC PANEL (CC13)
ALT: 35 U/L (ref 0–55)
ANION GAP: 11 meq/L (ref 3–11)
AST: 145 U/L — ABNORMAL HIGH (ref 5–34)
Albumin: 2.4 g/dL — ABNORMAL LOW (ref 3.5–5.0)
Alkaline Phosphatase: 231 U/L — ABNORMAL HIGH (ref 40–150)
BUN: 5.6 mg/dL — ABNORMAL LOW (ref 7.0–26.0)
CO2: 23 meq/L (ref 22–29)
CREATININE: 0.8 mg/dL (ref 0.7–1.3)
Calcium: 7.5 mg/dL — ABNORMAL LOW (ref 8.4–10.4)
Chloride: 96 mEq/L — ABNORMAL LOW (ref 98–109)
GLUCOSE: 90 mg/dL (ref 70–140)
Potassium: 3.3 mEq/L — ABNORMAL LOW (ref 3.5–5.1)
Sodium: 131 mEq/L — ABNORMAL LOW (ref 136–145)
Total Bilirubin: 7.02 mg/dL (ref 0.20–1.20)
Total Protein: 7.8 g/dL (ref 6.4–8.3)

## 2014-11-10 NOTE — Telephone Encounter (Signed)
Gave avs & calendar for April. Also gave CT contrast for scan. °

## 2014-11-10 NOTE — Progress Notes (Signed)
Hematology and Oncology Follow Up Visit  Allen Schroeder [id number] February 28, 1951 64 y.o. 11/10/2014 4:40 PM Allen Schroeder, MDPatterson, Allen Pacas, MD   Principle Diagnosis: 64 year old gentleman with castration resistant prostate cancer with metastatic disease to the bone. He was initially diagnosed in October of 2009 with bony metastasis.   Prior Therapy: He was treated with hormonal therapy with monthly Fermagon injections and monthly Zometa infusions. PSA fell from 72.9 in October 2009 to a nadir of 0.24 by June of 2012.  PSA began to rise again and was 4.09 on 02/06/2012. Bone scan at that point did not show any obvious new lesions. He was started on Xtandi and once again has had a very nice response since.   Current therapy: Xtandi 160 mg daily started September of 2013 with an excellent response PSA is close to 0.   Interim History:  Allen Schroeder presents today for a followup visit. He is scheduled for his regular follow-up in a few weeks but I was asked to see him sooner by Dr. Gaynelle Arabian. He presented with jaundice and question regardingXxtandi causing liver toxicity. He had been doing very well on Xtandi without any problems previously. He does report intermittent neuropathy which has not changed. Has not reported any nausea or vomiting or abdominal pain. Has not reported any neurological symptoms such as syncope or seizures. He has reported increased unsteadiness and increase in his abdominal girth and early satiety. He continues to drink heavily about 5-7 drinks a day. He has not reported any syncope or seizures. He has not reported any back pain shoulder pain or pathological fractures. He continues to perform activities of daily without any hindrance or decline. Has not reported any lower extremity edema or recent hospitalization. Has not reported any genitourinary complaints, petechiae or lymphadenopathy.  The rest of his review of systems unremarkable.   Medications: I have reviewed the  patient's current medications.  Current Outpatient Prescriptions  Medication Sig Dispense Refill  . CALCIUM CARB-CHOLECALCIFEROL PO Take 1 capsule by mouth daily.     . clopidogrel (PLAVIX) 75 MG tablet TAKE ONE TABLET BY MOUTH ONCE DAILY 30 tablet 11  . diphenhydrAMINE (BENADRYL) 25 MG tablet Take 25 mg by mouth every 6 (six) hours as needed.      . folic acid (FOLVITE) 1 MG tablet Take one tablet by mouth once a day.... 30 tablet 6  . metoprolol succinate (TOPROL-XL) 25 MG 24 hr tablet TAKE ONE TABLET BY MOUTH ONCE DAILY 30 tablet 11  . Multiple Vitamin (MULTIVITAMIN) tablet Take 1 tablet by mouth daily.      . nitroGLYCERIN (NITROSTAT) 0.4 MG SL tablet Place 1 tablet (0.4 mg total) under the tongue every 5 (five) minutes as needed. 25 tablet 3  . rosuvastatin (CRESTOR) 20 MG tablet TAKE ONE TABLET BY MOUTH ONCE DAILY 30 tablet 11  . sotalol (BETAPACE) 120 MG tablet TAKE ONE TABLET TWICE DAILY 60 tablet 11  . UNKNOWN TO PATIENT Slow release hormone implant placed 8/13 by Dr. Willette Cluster.  Good for one year    . XTANDI 40 MG capsule TAKE 4 CAPSULES BY MOUTH DAILY 120 capsule 1   No current facility-administered medications for this visit.     Allergies: No Known Allergies  Past Medical History, Surgical history, Social history, and Family History were reviewed and updated.   Physical Exam: Blood pressure 109/77, pulse 71, temperature 98.2 F (36.8 C), temperature source Oral, resp. rate 18, height 6\' 2"  (1.88 m), weight 230 lb 11.2 oz (  104.645 kg), SpO2 97 %. ECOG: 1 General appearance: alert and cooperative not in any distress. Head: Normocephalic, without obvious abnormality, atraumatic  Sclera is icteric. Neck: no adenopathy Lymph nodes: Cervical, supraclavicular, and axillary nodes normal. Heart:regular rate and rhythm, S1, S2 normal, no murmur, click, rub or gallop Lung:chest clear, no wheezing, rales, normal symmetric air entry Abdomin: soft, non-tender, without masses or  organomegaly EXT:no erythema, induration, or nodules   Lab Results: Lab Results  Component Value Date   WBC 11.7* 11/10/2014   HGB 11.7* 11/10/2014   HCT 34.1* 11/10/2014   MCV 116.8* 11/10/2014   PLT 113* 11/10/2014     Chemistry      Component Value Date/Time   NA 131* 11/10/2014 1532   NA 139 02/06/2012 1346   K 3.3* 11/10/2014 1532   K 4.0 02/06/2012 1346   CL 100 12/10/2012 1540   CL 105 02/06/2012 1346   CO2 23 11/10/2014 1532   CO2 23 02/06/2012 1346   BUN 5.6* 11/10/2014 1532   BUN 12 02/06/2012 1346   CREATININE 0.8 11/10/2014 1532   CREATININE 0.90 02/06/2012 1346      Component Value Date/Time   CALCIUM 7.5* 11/10/2014 1532   CALCIUM 8.8 02/06/2012 1346   ALKPHOS 231* 11/10/2014 1532   ALKPHOS 95 11/04/2012 0807   AST 145* 11/10/2014 1532   AST 93* 11/04/2012 0807   ALT 35 11/10/2014 1532   ALT 37 11/04/2012 0807   BILITOT 7.02* 11/10/2014 1532   BILITOT 0.9 11/04/2012 0807        Results for Allen Schroeder (MRN [id number]) as of 08/25/2014 11:43  Ref. Range 11/27/2013 14:46 03/05/2014 11:27 05/27/2014 13:25  PSA Latest Range: <=4.00 ng/mL 0.02 0.01 <0.01      Impression and Plan:  64 year old on with the following issues:  1. Castration resistant prostate cancer with metastatic disease to the bone. His continued on Xtandi with excellent response and a PSA of less than 0.01. I do not think his Gillermina Phy is contributing to his jaundice and for the time being I have asked him to continue it. He has been on it for at least 3 years without any previous problems.   We can certainly discontinue this medication has no other etiology for his jaundice has been determined. No dose adjustment is needed because of hepatic impairment.  2. Jaundice: His bilirubin is 7.0 which is increased from 2.2 in January 2016. Etiology unclear but certainly needs to be evaluated. This could be related to alcoholic liver disease, biliary obstruction due to malignancy, among other  causes. I will obtain a CT scan of the abdomen and pelvis and refer him to gastroenterology for an urgent evaluation.  His PSA continues to be very low but he certainly could have a malignancy that is not producing PSA.  3. Androgen deprivation: He is currently on androgen depravation. I recommend this continues for the time being.   4. Bone health: He has a bone density scheduled in the near future.  5. Followup: Will be in 2 weeks for a follow-up purposes.  Zola Button, MD 4/5/20164:40 PM

## 2014-11-11 ENCOUNTER — Telehealth: Payer: Self-pay | Admitting: *Deleted

## 2014-11-11 ENCOUNTER — Encounter (HOSPITAL_COMMUNITY): Payer: Self-pay

## 2014-11-11 ENCOUNTER — Ambulatory Visit (HOSPITAL_COMMUNITY)
Admission: RE | Admit: 2014-11-11 | Discharge: 2014-11-11 | Disposition: A | Discharge status: Home / Self Care | Payer: Medicare Other | Source: Ambulatory Visit | Attending: Oncology | Admitting: Oncology

## 2014-11-11 DIAGNOSIS — C61 Malignant neoplasm of prostate: Secondary | ICD-10-CM | POA: Insufficient documentation

## 2014-11-11 DIAGNOSIS — R188 Other ascites: Secondary | ICD-10-CM | POA: Diagnosis not present

## 2014-11-11 DIAGNOSIS — R140 Abdominal distension (gaseous): Secondary | ICD-10-CM | POA: Diagnosis not present

## 2014-11-11 DIAGNOSIS — R933 Abnormal findings on diagnostic imaging of other parts of digestive tract: Secondary | ICD-10-CM | POA: Insufficient documentation

## 2014-11-11 DIAGNOSIS — Z9221 Personal history of antineoplastic chemotherapy: Secondary | ICD-10-CM | POA: Insufficient documentation

## 2014-11-11 DIAGNOSIS — R162 Hepatomegaly with splenomegaly, not elsewhere classified: Secondary | ICD-10-CM | POA: Diagnosis not present

## 2014-11-11 DIAGNOSIS — I2510 Atherosclerotic heart disease of native coronary artery without angina pectoris: Secondary | ICD-10-CM | POA: Diagnosis not present

## 2014-11-11 DIAGNOSIS — C7951 Secondary malignant neoplasm of bone: Secondary | ICD-10-CM | POA: Diagnosis not present

## 2014-11-11 DIAGNOSIS — R609 Edema, unspecified: Secondary | ICD-10-CM | POA: Diagnosis not present

## 2014-11-11 DIAGNOSIS — R17 Unspecified jaundice: Secondary | ICD-10-CM

## 2014-11-11 LAB — PSA: PSA: 0.01 ng/mL (ref <=4.00)

## 2014-11-11 MED ORDER — IOHEXOL 300 MG/ML  SOLN
100.0000 mL | Freq: Once | INTRAMUSCULAR | Status: AC | PRN
  Administered 2014-11-11: 100 mL via INTRAVENOUS

## 2014-11-11 NOTE — Telephone Encounter (Signed)
Patient called and is aware of CT scan today at [facility]. Instructions given on when and what time to drink contrast. Patient verbalized understanding of time and place of scan.

## 2014-11-11 NOTE — Telephone Encounter (Signed)
This RN called and spoke with wife that [facility] can do his CT scan today at 4 pm. Patient needs to drink one bottle of contrast at 2:00 pm, the second bottle at 3:00 pm. He should be at [facility] at 3:45 pm and scan will be at 4:00 pm. Wife is in [place] and she is trying to get in touch with her husband. At this point, Allen Schroeder doesn't know about the scheduled CT scan. If he calls through triage, please give him this message or send him straight to Erline Levine, Therapist, sports, Chief Strategy Officer.

## 2014-11-12 ENCOUNTER — Telehealth: Payer: Self-pay | Admitting: Internal Medicine

## 2014-11-12 DIAGNOSIS — C61 Malignant neoplasm of prostate: Secondary | ICD-10-CM

## 2014-11-12 DIAGNOSIS — C7951 Secondary malignant neoplasm of bone: Principal | ICD-10-CM

## 2014-11-12 DIAGNOSIS — R17 Unspecified jaundice: Secondary | ICD-10-CM

## 2014-11-12 NOTE — Telephone Encounter (Signed)
Patient notified He will come for labs today or tomorrow Office visit for 11/18/14 10:15

## 2014-11-12 NOTE — Telephone Encounter (Signed)
Patient w/ metastatic prostate cancer and new jaundice CT shows hepatomegaly - no obstruction no mets  Working dx is alcoholic liver disease - hepatitis/cirrhosis   Patient needs:  1) CMET 2) INR 3) Hepatitis A Ab total 4) Hepatitis B surface Ab 5) Hepatitis B surface Ag 6) Hepatitis B core Ab total 7)HCV Ab 8) ANA   Can be sen next week  He should stop drinking

## 2014-11-13 ENCOUNTER — Other Ambulatory Visit (INDEPENDENT_AMBULATORY_CARE_PROVIDER_SITE_OTHER): Payer: Medicare Other

## 2014-11-13 ENCOUNTER — Encounter (HOSPITAL_COMMUNITY): Payer: Self-pay

## 2014-11-13 ENCOUNTER — Encounter: Payer: Self-pay | Admitting: Internal Medicine

## 2014-11-13 ENCOUNTER — Other Ambulatory Visit: Payer: Self-pay | Admitting: Internal Medicine

## 2014-11-13 DIAGNOSIS — R17 Unspecified jaundice: Secondary | ICD-10-CM | POA: Diagnosis not present

## 2014-11-13 DIAGNOSIS — C7951 Secondary malignant neoplasm of bone: Secondary | ICD-10-CM

## 2014-11-13 DIAGNOSIS — K7011 Alcoholic hepatitis with ascites: Secondary | ICD-10-CM

## 2014-11-13 DIAGNOSIS — C61 Malignant neoplasm of prostate: Secondary | ICD-10-CM | POA: Diagnosis not present

## 2014-11-13 HISTORY — DX: Alcoholic hepatitis with ascites: K70.11

## 2014-11-13 LAB — COMPREHENSIVE METABOLIC PANEL WITH GFR
ALT: 31 U/L (ref 0–53)
AST: 135 U/L — ABNORMAL HIGH (ref 0–37)
Albumin: 2.8 g/dL — ABNORMAL LOW (ref 3.5–5.2)
Alkaline Phosphatase: 211 U/L — ABNORMAL HIGH (ref 39–117)
BUN: 5 mg/dL — ABNORMAL LOW (ref 6–23)
CO2: 26 meq/L (ref 19–32)
Calcium: 7.5 mg/dL — ABNORMAL LOW (ref 8.4–10.5)
Chloride: 94 meq/L — ABNORMAL LOW (ref 96–112)
Creatinine, Ser: 0.78 mg/dL (ref 0.40–1.50)
GFR: 105.84 mL/min (ref >60.00)
Glucose, Bld: 96 mg/dL (ref 70–99)
Potassium: 3 meq/L — ABNORMAL LOW (ref 3.5–5.1)
Sodium: 127 meq/L — ABNORMAL LOW (ref 135–145)
Total Bilirubin: 7.1 mg/dL — ABNORMAL HIGH (ref 0.2–1.2)
Total Protein: 8 g/dL (ref 6.0–8.3)

## 2014-11-13 LAB — PROTIME-INR
INR: 1.5 ratio — ABNORMAL HIGH (ref 0.8–1.0)
Prothrombin Time: 16.7 s — ABNORMAL HIGH (ref 9.6–13.1)

## 2014-11-13 MED ORDER — POTASSIUM CHLORIDE CRYS ER 20 MEQ PO TBCR: 20.0000 meq | EXTENDED_RELEASE_TABLET | Freq: Two times a day (BID) | ORAL | Status: DC

## 2014-11-13 NOTE — Progress Notes (Signed)
Quick Note:  Called results Says he feels ok Kdur 20 meq daily ordered I advised stopping EtOH "Hard to do after 50 years" Has f/u 4/13 ______

## 2014-11-14 LAB — HEPATITIS A ANTIBODY, TOTAL: HEP A TOTAL AB: NONREACTIVE

## 2014-11-14 LAB — HEPATITIS B SURFACE ANTIGEN: HEP B S AG: NEGATIVE

## 2014-11-14 LAB — HEPATITIS B SURFACE ANTIBODY, QUANTITATIVE: HEPATITIS B-POST: 0 m[IU]/mL

## 2014-11-14 LAB — HEPATITIS C ANTIBODY: HCV AB: NEGATIVE

## 2014-11-16 ENCOUNTER — Encounter (HOSPITAL_COMMUNITY): Payer: Self-pay

## 2014-11-16 LAB — ANA: Anti Nuclear Antibody(ANA): NEGATIVE

## 2014-11-18 ENCOUNTER — Encounter (HOSPITAL_COMMUNITY): Payer: Self-pay

## 2014-11-18 ENCOUNTER — Ambulatory Visit (INDEPENDENT_AMBULATORY_CARE_PROVIDER_SITE_OTHER): Payer: Medicare Other | Admitting: Internal Medicine

## 2014-11-18 ENCOUNTER — Encounter: Payer: Self-pay | Admitting: Internal Medicine

## 2014-11-18 ENCOUNTER — Inpatient Hospital Stay (HOSPITAL_COMMUNITY)
Admission: AD | Admit: 2014-11-18 | Discharge: 2014-11-24 | DRG: 420 | Disposition: A | Discharge status: Skilled Nursing Facility | Payer: Medicare Other | Source: Ambulatory Visit | Attending: Internal Medicine | Admitting: Internal Medicine

## 2014-11-18 VITALS — BP 116/76 | HR 94 | Ht 74.0 in | Wt 233.0 lb

## 2014-11-18 DIAGNOSIS — Z7902 Long term (current) use of antithrombotics/antiplatelets: Secondary | ICD-10-CM

## 2014-11-18 DIAGNOSIS — K766 Portal hypertension: Secondary | ICD-10-CM | POA: Diagnosis not present

## 2014-11-18 DIAGNOSIS — E860 Dehydration: Secondary | ICD-10-CM | POA: Diagnosis present

## 2014-11-18 DIAGNOSIS — D696 Thrombocytopenia, unspecified: Secondary | ICD-10-CM | POA: Diagnosis present

## 2014-11-18 DIAGNOSIS — D630 Anemia in neoplastic disease: Secondary | ICD-10-CM | POA: Diagnosis present

## 2014-11-18 DIAGNOSIS — Z79899 Other long term (current) drug therapy: Secondary | ICD-10-CM

## 2014-11-18 DIAGNOSIS — R609 Edema, unspecified: Secondary | ICD-10-CM

## 2014-11-18 DIAGNOSIS — C7951 Secondary malignant neoplasm of bone: Secondary | ICD-10-CM | POA: Diagnosis present

## 2014-11-18 DIAGNOSIS — K7290 Hepatic failure, unspecified without coma: Secondary | ICD-10-CM | POA: Diagnosis present

## 2014-11-18 DIAGNOSIS — I4891 Unspecified atrial fibrillation: Secondary | ICD-10-CM | POA: Diagnosis present

## 2014-11-18 DIAGNOSIS — Z6827 Body mass index (BMI) 27.0-27.9, adult: Secondary | ICD-10-CM | POA: Diagnosis not present

## 2014-11-18 DIAGNOSIS — R0600 Dyspnea, unspecified: Secondary | ICD-10-CM

## 2014-11-18 DIAGNOSIS — I2510 Atherosclerotic heart disease of native coronary artery without angina pectoris: Secondary | ICD-10-CM | POA: Diagnosis present

## 2014-11-18 DIAGNOSIS — I10 Essential (primary) hypertension: Secondary | ICD-10-CM

## 2014-11-18 DIAGNOSIS — F10239 Alcohol dependence with withdrawal, unspecified: Secondary | ICD-10-CM | POA: Diagnosis present

## 2014-11-18 DIAGNOSIS — F419 Anxiety disorder, unspecified: Secondary | ICD-10-CM | POA: Diagnosis present

## 2014-11-18 DIAGNOSIS — K7011 Alcoholic hepatitis with ascites: Secondary | ICD-10-CM

## 2014-11-18 DIAGNOSIS — D689 Coagulation defect, unspecified: Secondary | ICD-10-CM | POA: Diagnosis present

## 2014-11-18 DIAGNOSIS — R188 Other ascites: Secondary | ICD-10-CM

## 2014-11-18 DIAGNOSIS — R109 Unspecified abdominal pain: Secondary | ICD-10-CM | POA: Diagnosis present

## 2014-11-18 DIAGNOSIS — Z87891 Personal history of nicotine dependence: Secondary | ICD-10-CM | POA: Diagnosis not present

## 2014-11-18 DIAGNOSIS — M7989 Other specified soft tissue disorders: Secondary | ICD-10-CM | POA: Diagnosis present

## 2014-11-18 DIAGNOSIS — E871 Hypo-osmolality and hyponatremia: Secondary | ICD-10-CM | POA: Diagnosis present

## 2014-11-18 DIAGNOSIS — F1020 Alcohol dependence, uncomplicated: Secondary | ICD-10-CM | POA: Insufficient documentation

## 2014-11-18 DIAGNOSIS — E785 Hyperlipidemia, unspecified: Secondary | ICD-10-CM

## 2014-11-18 DIAGNOSIS — F411 Generalized anxiety disorder: Secondary | ICD-10-CM | POA: Insufficient documentation

## 2014-11-18 DIAGNOSIS — R17 Unspecified jaundice: Secondary | ICD-10-CM

## 2014-11-18 DIAGNOSIS — E43 Unspecified severe protein-calorie malnutrition: Secondary | ICD-10-CM | POA: Diagnosis present

## 2014-11-18 DIAGNOSIS — Z955 Presence of coronary angioplasty implant and graft: Secondary | ICD-10-CM | POA: Diagnosis not present

## 2014-11-18 DIAGNOSIS — C61 Malignant neoplasm of prostate: Secondary | ICD-10-CM | POA: Diagnosis present

## 2014-11-18 DIAGNOSIS — K7460 Unspecified cirrhosis of liver: Secondary | ICD-10-CM | POA: Diagnosis present

## 2014-11-18 DIAGNOSIS — F10939 Alcohol use, unspecified with withdrawal, unspecified: Secondary | ICD-10-CM | POA: Insufficient documentation

## 2014-11-18 LAB — PROTIME-INR
INR: 1.56 — AB (ref 0.00–1.49)
PROTHROMBIN TIME: 18.9 s — AB (ref 11.6–15.2)

## 2014-11-18 LAB — COMPREHENSIVE METABOLIC PANEL
ALT: 35 U/L (ref 0–53)
ANION GAP: 11 (ref 5–15)
AST: 160 U/L — AB (ref 0–37)
Albumin: 2.4 g/dL — ABNORMAL LOW (ref 3.5–5.2)
Alkaline Phosphatase: 205 U/L — ABNORMAL HIGH (ref 39–117)
BUN: 7 mg/dL (ref 6–23)
CALCIUM: 7.2 mg/dL — AB (ref 8.4–10.5)
CHLORIDE: 98 mmol/L (ref 96–112)
CO2: 21 mmol/L (ref 19–32)
Creatinine, Ser: 0.64 mg/dL (ref 0.50–1.35)
Glucose, Bld: 94 mg/dL (ref 70–99)
Potassium: 3.8 mmol/L (ref 3.5–5.1)
Sodium: 130 mmol/L — ABNORMAL LOW (ref 135–145)
Total Bilirubin: 8.5 mg/dL — ABNORMAL HIGH (ref 0.3–1.2)
Total Protein: 7.5 g/dL (ref 6.0–8.3)

## 2014-11-18 LAB — ETHANOL: Alcohol, Ethyl (B): 136 mg/dL — ABNORMAL HIGH (ref 0–9)

## 2014-11-18 LAB — CBC
HEMATOCRIT: 31.8 % — AB (ref 39.0–52.0)
HEMOGLOBIN: 11 g/dL — AB (ref 13.0–17.0)
MCH: 40.6 pg — AB (ref 26.0–34.0)
MCHC: 34.6 g/dL (ref 30.0–36.0)
MCV: 117.3 fL — ABNORMAL HIGH (ref 78.0–100.0)
Platelets: 113 10*3/uL — ABNORMAL LOW (ref 150–400)
RBC: 2.71 MIL/uL — ABNORMAL LOW (ref 4.22–5.81)
RDW: 18.6 % — ABNORMAL HIGH (ref 11.5–15.5)
WBC: 9.1 10*3/uL (ref 4.0–10.5)

## 2014-11-18 LAB — AMMONIA: Ammonia: 39 umol/L — ABNORMAL HIGH (ref 11–32)

## 2014-11-18 LAB — MAGNESIUM: MAGNESIUM: 2 mg/dL (ref 1.5–2.5)

## 2014-11-18 MED ORDER — THIAMINE HCL 100 MG/ML IJ SOLN: 100.0000 mg | Freq: Every day | INTRAMUSCULAR | Status: DC

## 2014-11-18 MED ORDER — VITAMIN B-1 100 MG PO TABS
100.0000 mg | ORAL_TABLET | Freq: Every day | ORAL | Status: DC
  Administered 2014-11-18 – 2014-11-24 (×7): 100 mg via ORAL
  Filled 2014-11-18 (×7): qty 1

## 2014-11-18 MED ORDER — METOPROLOL SUCCINATE ER 25 MG PO TB24
25.0000 mg | ORAL_TABLET | Freq: Every day | ORAL | Status: DC
  Administered 2014-11-18: 25 mg via ORAL
  Filled 2014-11-18 (×2): qty 1

## 2014-11-18 MED ORDER — FOLIC ACID 1 MG PO TABS
1.0000 mg | ORAL_TABLET | Freq: Every day | ORAL | Status: DC
  Administered 2014-11-18 – 2014-11-24 (×7): 1 mg via ORAL
  Filled 2014-11-18 (×7): qty 1

## 2014-11-18 MED ORDER — SOTALOL HCL 120 MG PO TABS
120.0000 mg | ORAL_TABLET | Freq: Two times a day (BID) | ORAL | Status: DC
  Administered 2014-11-18 – 2014-11-19 (×2): 120 mg via ORAL
  Filled 2014-11-18 (×3): qty 1

## 2014-11-18 MED ORDER — ENZALUTAMIDE 40 MG PO CAPS: 160.0000 mg | ORAL_CAPSULE | Freq: Every day | ORAL | Status: DC

## 2014-11-18 MED ORDER — FUROSEMIDE 10 MG/ML IJ SOLN
20.0000 mg | Freq: Every day | INTRAMUSCULAR | Status: DC
  Administered 2014-11-18 – 2014-11-21 (×4): 20 mg via INTRAVENOUS
  Filled 2014-11-18 (×5): qty 2

## 2014-11-18 MED ORDER — SODIUM CHLORIDE 0.9 % IJ SOLN: 3.0000 mL | INTRAMUSCULAR | Status: DC | PRN

## 2014-11-18 MED ORDER — SODIUM CHLORIDE 0.9 % IV SOLN: 250.0000 mL | INTRAVENOUS | Status: DC | PRN

## 2014-11-18 MED ORDER — CLOPIDOGREL BISULFATE 75 MG PO TABS
75.0000 mg | ORAL_TABLET | Freq: Every day | ORAL | Status: DC
  Administered 2014-11-18: 75 mg via ORAL
  Filled 2014-11-18: qty 1

## 2014-11-18 MED ORDER — ROSUVASTATIN CALCIUM 20 MG PO TABS
20.0000 mg | ORAL_TABLET | Freq: Every day | ORAL | Status: DC
  Filled 2014-11-18: qty 1

## 2014-11-18 MED ORDER — ONDANSETRON HCL 4 MG/2ML IJ SOLN: 4.0000 mg | Freq: Four times a day (QID) | INTRAMUSCULAR | Status: DC | PRN

## 2014-11-18 MED ORDER — LORAZEPAM 1 MG PO TABS
1.0000 mg | ORAL_TABLET | Freq: Four times a day (QID) | ORAL | Status: DC | PRN
  Administered 2014-11-18 – 2014-11-19 (×2): 1 mg via ORAL
  Filled 2014-11-18 (×2): qty 1

## 2014-11-18 MED ORDER — SPIRONOLACTONE 25 MG PO TABS
25.0000 mg | ORAL_TABLET | Freq: Every day | ORAL | Status: DC
  Administered 2014-11-18 – 2014-11-20 (×3): 25 mg via ORAL
  Filled 2014-11-18 (×3): qty 1

## 2014-11-18 MED ORDER — ONDANSETRON HCL 4 MG PO TABS: 4.0000 mg | ORAL_TABLET | Freq: Four times a day (QID) | ORAL | Status: DC | PRN

## 2014-11-18 MED ORDER — SODIUM CHLORIDE 0.9 % IJ SOLN
3.0000 mL | Freq: Two times a day (BID) | INTRAMUSCULAR | Status: DC
  Administered 2014-11-18 – 2014-11-23 (×11): 3 mL via INTRAVENOUS

## 2014-11-18 MED ORDER — SALINE SPRAY 0.65 % NA SOLN
1.0000 | NASAL | Status: DC | PRN
  Administered 2014-11-18: 1 via NASAL
  Filled 2014-11-18: qty 44

## 2014-11-18 MED ORDER — ADULT MULTIVITAMIN W/MINERALS CH
1.0000 | ORAL_TABLET | Freq: Every day | ORAL | Status: DC
  Administered 2014-11-18 – 2014-11-24 (×7): 1 via ORAL
  Filled 2014-11-18 (×7): qty 1

## 2014-11-18 MED ORDER — LORAZEPAM 2 MG/ML IJ SOLN: 1.0000 mg | Freq: Four times a day (QID) | INTRAMUSCULAR | Status: DC | PRN

## 2014-11-18 MED ORDER — POTASSIUM CHLORIDE CRYS ER 20 MEQ PO TBCR
20.0000 meq | EXTENDED_RELEASE_TABLET | Freq: Two times a day (BID) | ORAL | Status: DC
  Administered 2014-11-18 – 2014-11-24 (×12): 20 meq via ORAL
  Filled 2014-11-18 (×15): qty 1

## 2014-11-18 NOTE — Consult Note (Signed)
RAYDON CHAPPUIS (MR# [id number])      Progress Notes Info    Author Note Status Last Update User Last Update Date/Time   Gatha Mayer, MD Signed Gatha Mayer, MD 11/18/2014 1:23 PM    Progress Notes    Expand All Collapse All     Subjective:    Patient ID: Allen Schroeder, male DOB: 08/07/1951, 64 y.o. MRN: [id number] Cc: Jaundice, liver problems HPI The patient is a 64 year old married white man with a long history of alcohol use and abuse and alcoholism. He currently drinks 8 scotch + water daily. He was found to be jaundiced by his oncologist and was treating his metastatic prostate cancer. CT scanning did not show any biliary obstruction or signs of metastases it was consistent with alcoholic hepatitis I think. I evaluated him with labs before this visit, he is negative for ANA, hepatitis serologies. He is in the office today complaining of abdominal distention, some fatigue, he has multiple joint pains and bone pains related to his prostate cancer. He is having swelling of his feet and ankles and lower legs. He denies any confusion. His wife comes in for part of the interview. She relates that he has been through alcohol rehabilitation in the past but has always gone back. He is interested in trying to stop alcohol after we discussed the need for that. He denies any other GI symptoms at this time. His current signs and symptoms of a developing over the last month or so. Is not tried any new treatment for these problems. No Known Allergies Outpatient Prescriptions Prior to Visit  Medication Sig Dispense Refill  . CALCIUM CARB-CHOLECALCIFEROL PO Take 1 capsule by mouth daily.     . clopidogrel (PLAVIX) 75 MG tablet TAKE ONE TABLET BY MOUTH ONCE DAILY 30 tablet 11  . diphenhydrAMINE (BENADRYL) 25 MG tablet Take 25 mg by mouth every 6 (six) hours as needed.     . folic acid (FOLVITE) 1 MG tablet Take one tablet by mouth once a day.... 30 tablet 6  .  metoprolol succinate (TOPROL-XL) 25 MG 24 hr tablet TAKE ONE TABLET BY MOUTH ONCE DAILY 30 tablet 11  . Multiple Vitamin (MULTIVITAMIN) tablet Take 1 tablet by mouth daily.     . nitroGLYCERIN (NITROSTAT) 0.4 MG SL tablet Place 1 tablet (0.4 mg total) under the tongue every 5 (five) minutes as needed. 25 tablet 3  . potassium chloride SA (K-DUR,KLOR-CON) 20 MEQ tablet Take 1 tablet (20 mEq total) by mouth 2 (two) times daily. 30 tablet 1  . rosuvastatin (CRESTOR) 20 MG tablet TAKE ONE TABLET BY MOUTH ONCE DAILY 30 tablet 11  . sotalol (BETAPACE) 120 MG tablet TAKE ONE TABLET TWICE DAILY 60 tablet 11  . UNKNOWN TO PATIENT Slow release hormone implant placed 8/13 by Dr. Willette Cluster. Good for one year    . XTANDI 40 MG capsule TAKE 4 CAPSULES BY MOUTH DAILY 120 capsule 1   No facility-administered medications prior to visit.   Past Medical History  Diagnosis Date  . Atrial fibrillation   . Other and unspecified hyperlipidemia   . Unspecified essential hypertension   . Malignant neoplasm of prostate 11/2007    Mets to Bone  . Elevated prostate specific antigen (PSA)   . Personal history of colonic polyps   . Hypocalcemia   . Acute alcoholic hepatitis   . Essential and other specified forms of tremor   . Anxiety state, unspecified   . Family history  of malignant neoplasm of gastrointestinal tract   . Alcohol abuse with physiological dependence 02/13/2012  . 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 Alcoholic hepatitis with ascites 11/13/2014   Past Surgical History  Procedure Laterality Date  . Vasectomy    . Cholecystectomy    . Coronary stent placement  2011  . Colonoscopy w/ biopsies     History   Social History  . Marital Status: Married    Spouse Name: N/A  . Number of  Children: 3  . Years of Education: N/A   Occupational History  . lawyer     VP of [employer] History Main Topics  . Smoking status: Former Smoker    Types: Cigarettes  . Smokeless tobacco: Never Used     Comment: stopped in college  . Alcohol Use: Yes     Comment: has consumed as much as a 1/2 gallon of vodka in a day, now drinks at least 3-5 drinks per day.   . Drug Use: No  . Sexual Activity: No   Other Topics Concern  . None   Social History Narrative   Patient is married. 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   11/18/2014      Family History  Problem Relation Age of Onset  . Prostate cancer Father   . Coronary artery disease Father   . Colon cancer Father   . Aneurysm Mother       Review of Systems No fevers reported. Denies any urinary problems. He is somewhat weak. He has a cardiac stent, cared for by Dr. Burt Knack he also has history of atrial fibrillation but does not take anticoagulation because of his bleeding risk. He does take Plavix for his cardiac stent. Not currently on aspirin. Taking Xtandi (anti-androgen) for prostate cancer all other review of systems appear negative at this time.    Objective:   Physical Exam @BP  116/76 mmHg  Pulse 94  Ht 6\' 2"  (1.88 m)  Wt 233 lb (105.688 kg)  BMI 29.90 kg/m2  SpO2 96%@  General: Jaundiced and chroncally ill Eyes:icteric. Lungs:Clear to auscultation bilaterally. Heart: S1S2, no rubs, murmurs, gallops. 2+ Bilat LE edema Abdomen: distended with moderate ascites at least non-tender, no mass and BS+.  Extremities:  No cyanosis or clubbing Skin jaundiced Neuro: A&O x 3. ? Trace asterixis Psych: appropriate mood and Affect.   Data Reviewed: Labs, CT scanning and imaging in  the EMR. He is hyponatremic and hypokalemic based on labs last week and started him on a potassium supplement. INR was 1.55 days ago.   Recent Labs    Lab Results  Component Value Date   ALT 31 11/13/2014   AST 135* 11/13/2014   ALKPHOS 211* 11/13/2014   BILITOT 7.1* 11/13/2014      Recent Labs    Lab Results  Component Value Date   CREATININE 0.78 11/13/2014   BUN 5* 11/13/2014   NA 127* 11/13/2014   K 3.0* 11/13/2014   CL 94* 11/13/2014   CO2 26 11/13/2014      Recent Labs    Lab Results  Component Value Date   INR 1.5* 11/13/2014   INR 1.02 03/18/2010   INR 7.8 ratio* 03/08/2009   PROTIME 20.8 01/21/2009   PROTIME 19.3 01/18/2009      Recent Labs    Lab Results  Component  Value Date   WBC 11.7* 11/10/2014   HGB 11.7* 11/10/2014   HCT 34.1* 11/10/2014   MCV 116.8* 11/10/2014   PLT 113* 11/10/2014         Assessment & Plan:   1. Alcoholic hepatitis with ascites   2. Alcoholism   3. Ascites   4. Edema   5. Jaundice    Is a very complicated and ill patient. He is not toxic but he essentially has a life-threatening illness at this time without colic hepatitis. I've explained this to the patient and his wife. I recommended admission for detoxification from alcohol and treatment of his alcoholic hepatitis. Treatment of ascites as well. The etiology of problem seems to be alcohol. After he left the office I did some more research and I think that the anti-androgen treatment for his prostate cancer could be playing some sort of a role. It can cause hyperbilirubinemia. However alcohol over the years is the primary underlying issue I feel certain.  1. Dr. Coralyn Pear of the hospitalist team is kindly accepted to admit him for treatment. 2. [facility] will follow up by tomorrow for consultative care. 3. At this point he needs chemistries, INR, CBC and ammonia  level to evaluate his decompensated liver disease. 4. He will need alcohol detox protocol. Prevention of withdrawal using benzodiazepines. 5. Paracentesis as appropriate so his clopidogrel should be held prior to doing that probably at least 5 days. 6. Would treat with diuretics pending what his electrolytes tell us. 7. Nutrition consult should be undertaken. 8. He should receive judicious IV fluids to avoid further fluid overload 9. Would treat with thiamine and IV multivitamin as well. 10. Consider holding the Xtandia given its antiangiogenic mechanism but would check with oncology before we did that. Again alcohol is the primary problem. 11. Calculate discriminant function to see if prednisone treatment would make sense once we repeat his labs. 12. 325 mg daily aspirin would seem to make sense given his atrial fibrillation and stent history. Cover with PPI. 13. Further plans pending clinical course in this complicated patient.  Cc: Thurston Pounds, MD; Zola Button, MD, Vassie Moment, MD

## 2014-11-18 NOTE — Patient Instructions (Signed)
Please go over to [facility] and the hospitalist are working on getting you a bed.    I appreciate the opportunity to care for you.

## 2014-11-18 NOTE — H&P (Addendum)
[facility] History and Physical  Allen Schroeder ZOX:[id number] DOB: [birth year] DOA: 11/18/2014  Referring physician: ED physician PCP: Silvano Rusk, MD   Chief Complaint: Abdominal swelling, lower extremity swelling, fatigue  HPI:  64 year old male with known metastatic prostate cancer, under the care of Dr. [name], long history of alcohol abuse, still currently drinking, presented to Dr. Celesta Aver office earlier today prior to this admission with main concern of progressively worsening swelling in the feet and ankles, abdomen, fatigue and poor oral intake. In addition patient reported diffuse body pains over the past several weeks. Patient denies any specific symptom like chest pain or shortness of breath, denies fevers or chills. In the office, patient notably jaundiced with swelling in feet and ankles, lower extremities, abdomen distended. Tried hospitalist asked to admit for further evaluation and management. Please note that patient is currently rather tired and fairly difficult to obtain detailed history from. Per earlier report review, wife reported that patient has been through alcohol rehabilitation but that has not work as patient is still actively drinking and wife noted some episodes of confusion at home.  Assessment and Plan: Active Problems:   Alcoholic hepatitis with ascites - Admit to telemetry bed for further evaluation - Repeat see met now and again in the morning for trending purposes - IR consulted for consideration of paracentesis, Plavix on hold for now - Check ammonia level, INR, CBC - place on lasix and spironolactone   Transaminitis with hyperbilirubinemia - alcohol induced hepatitis  - Jaundiced skin, continue to monitor LFT     Metastatic prostate cancer  - Will notify oncologist of patient's admission  - holding Xtandia and will defer to Dr. [name]    Hyperlipidemia - Hold statin until liver function tests stabilize   Hyponatremia - Secondary to  dehydration in the setting of alcohol intoxication - difficult to give IVF with ascites and LE edema    Alcohol intoxication in patient with known chronic alcohol abuse - Level greater than 136 - Placed on CIWA protocol, start thiamine, MVI, folate  - We'll discuss cessation once patient more clinically stable - Monitor closely for signs of withdrawal   Hypertension, Essential - Continue metoprolol and sotalol - added spironolactone and lasix    DVT prophylaxis - SCDs  Radiological Exams on Admission: No results found.   Code Status: Full Family Communication: Pt  and son at bedside Disposition Plan: Admit for further evaluation    [facility] [phone]   Review of Systems:  Constitutional: Negative for fever, chills, negative for diaphoresis.  HENT: Negative for hearing loss, ear pain, nosebleeds, congestion, sore throat, neck pain, tinnitus and ear discharge.   Eyes: Negative for blurred vision, double vision, photophobia, pain, discharge and redness.  Respiratory: Negative for cough, hemoptysis, sputum production, shortness of breath, wheezing and stridor.   Cardiovascular: Negative for chest pain, palpitations, orthopnea Gastrointestinal: Negative for heartburn, constipation, blood in stool and melena.  Genitourinary: Negative for dysuria, urgency, frequency, hematuria and flank pain.  Musculoskeletal: Diffuse joint and body aches Skin: Negative for itching and rash.  Neurological: Negative for dizziness and weakness.  Endo/Heme/Allergies: Negative for environmental allergies and polydipsia. Does not bruise/bleed easily.  Psychiatric/Behavioral: Negative for suicidal ideas.    Past Medical History  Diagnosis Date  . Atrial fibrillation   . Other and unspecified hyperlipidemia   . Unspecified essential hypertension   . Malignant neoplasm of prostate 11/2007    Mets to Bone  . Elevated prostate specific antigen (PSA)   .  Personal history of colonic polyps   .  Hypocalcemia   . Acute alcoholic hepatitis   . Essential and other specified forms of tremor   . Anxiety state, unspecified   . Family history of malignant neoplasm of gastrointestinal tract   . Alcohol abuse with physiological dependence 02/13/2012  . 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 Alcoholic hepatitis with ascites 11/13/2014    Past Surgical History  Procedure Laterality Date  . Vasectomy    . Cholecystectomy    . Coronary stent placement  2011  . Colonoscopy w/ biopsies      Social History:  reports that he has quit smoking. His smoking use included Cigarettes. He has never used smokeless tobacco. He reports that he drinks alcohol. He reports that he does not use illicit drugs.  No Known Allergies  Family History  Problem Relation Age of Onset  . Prostate cancer Father   . Coronary artery disease Father   . Colon cancer Father   . Aneurysm Mother     Medication Sig  clopidogrel (PLAVIX) 75 MG tablet TAKE ONE TABLET BY MOUTH ONCE DAILY  folic acid (FOLVITE) 1 MG tablet Take one tablet by mouth once a day....  metoprolol succinate (TOPROL-XL) 25 MG 24 hr tablet TAKE ONE TABLET BY MOUTH ONCE DAILY  nitroGLYCERIN (NITROSTAT) 0.4 MG SL tablet Place 1 tablet (0.4 mg total) under the tongue every 5 (five) minutes as needed.  potassium chloride SA (K-DUR,KLOR-CON) 20 MEQ tablet Take 1 tablet (20 mEq total) by mouth 2 (two) times daily.  rosuvastatin (CRESTOR) 20 MG tablet TAKE ONE TABLET BY MOUTH ONCE DAILY  sotalol (BETAPACE) 120 MG tablet TAKE ONE TABLET TWICE DAILY  XTANDI 40 MG capsule TAKE 4 CAPSULES BY MOUTH DAILY    Physical Exam: Filed Vitals:   11/18/14 1237  BP: 130/80  Pulse: 90  Temp: 97.9 F (36.6 C)  TempSrc: Oral  Resp: 20  Height: 6' 2"  (1.88 m)  Weight: 93.441 kg (206 lb)  SpO2: 98%    Physical Exam  Constitutional: Appears well-developed and well-nourished.  No distress.  HENT: Normocephalic. External right and left ear normal. Oropharynx is clear and moist.  Eyes: Conjunctivae and EOM are normal. PERRLA, scleral icterus  Neck: Normal ROM. Neck supple. No JVD. No tracheal deviation. No thyromegaly.  CVS: RRR, S1/S2 +, no murmurs, no gallops, no carotid bruit.  Pulmonary: Effort and breath sounds normal, no stridor,  Diminished breath sounds at bases Abdominal: distended with fluid shift, nontender, no rebound  Musculoskeletal: Normal range of motion. bilateral lower extremity pitting edema from feet to knees  Lymphadenopathy: No lymphadenopathy noted, cervical, inguinal. Neuro: Alert. Normal reflexes, muscle tone coordination. No cranial nerve deficit. Skin: jaundiced skin Psychiatric: Normal mood and affect. Behavior, judgment, thought content normal.   Labs on Admission:  Basic Metabolic Panel:  Recent Labs Lab 11/13/14 1616  NA 127*  K 3.0*  CL 94*  CO2 26  GLUCOSE 96  BUN 5*  CREATININE 0.78  CALCIUM 7.5*   Liver Function Tests:  Recent Labs Lab 11/13/14 1616  AST 135*  ALT 31  ALKPHOS 211*  BILITOT 7.1*  PROT 8.0  ALBUMIN 2.8*   No results for input(s): LIPASE, AMYLASE in the last 168 hours. No results for input(s): AMMONIA in the last 168 hours. CBC: No results for input(s): WBC, NEUTROABS, HGB, HCT, MCV, PLT in the last 168 hours. Cardiac  Enzymes: No results for input(s): CKTOTAL, CKMB, CKMBINDEX, TROPONINI in the last 168 hours. BNP: Invalid input(s): POCBNP CBG: No results for input(s): GLUCAP in the last 168 hours.  EKG: Normal sinus rhythm, no ST/T wave changes   If 7PM-7AM, please contact night-coverage [email or web address] Password [email or web address] 11/18/2014, 1:28 PM

## 2014-11-18 NOTE — Progress Notes (Signed)
Subjective:    Patient ID: Allen Schroeder, male    DOB: 10-16-1950, 64 y.o.   MRN: [id number] Cc: Jaundice, liver problems HPI The patient is a 64 year old married white man with a long history of alcohol use and abuse and alcoholism. He currently drinks 8 scotch + water daily. He was found to be jaundiced by his oncologist and was treating his metastatic prostate cancer. CT scanning did not show any biliary obstruction or signs of metastases it was consistent with alcoholic hepatitis I think. I evaluated him with labs before this visit, he is negative for ANA, hepatitis serologies. He is in the office today complaining of abdominal distention, some fatigue, he has multiple joint pains and bone pains related to his prostate cancer. He is having swelling of his feet and ankles and lower legs. He denies any confusion. His wife comes in for part of the interview. She relates that he has been through alcohol rehabilitation in the past but has always gone back. He is interested in trying to stop alcohol after we discussed the need for that. He denies any other GI symptoms at this time. His current signs and symptoms of a developing over the last month or so. Is not tried any new treatment for these problems. No Known Allergies Outpatient Prescriptions Prior to Visit  Medication Sig Dispense Refill  . CALCIUM CARB-CHOLECALCIFEROL PO Take 1 capsule by mouth daily.     . clopidogrel (PLAVIX) 75 MG tablet TAKE ONE TABLET BY MOUTH ONCE DAILY 30 tablet 11  . diphenhydrAMINE (BENADRYL) 25 MG tablet Take 25 mg by mouth every 6 (six) hours as needed.      . folic acid (FOLVITE) 1 MG tablet Take one tablet by mouth once a day.... 30 tablet 6  . metoprolol succinate (TOPROL-XL) 25 MG 24 hr tablet TAKE ONE TABLET BY MOUTH ONCE DAILY 30 tablet 11  . Multiple Vitamin (MULTIVITAMIN) tablet Take 1 tablet by mouth daily.      . nitroGLYCERIN (NITROSTAT) 0.4 MG SL tablet Place 1 tablet (0.4 mg total) under the tongue every  5 (five) minutes as needed. 25 tablet 3  . potassium chloride SA (K-DUR,KLOR-CON) 20 MEQ tablet Take 1 tablet (20 mEq total) by mouth 2 (two) times daily. 30 tablet 1  . rosuvastatin (CRESTOR) 20 MG tablet TAKE ONE TABLET BY MOUTH ONCE DAILY 30 tablet 11  . sotalol (BETAPACE) 120 MG tablet TAKE ONE TABLET TWICE DAILY 60 tablet 11  . UNKNOWN TO PATIENT Slow release hormone implant placed 8/13 by Dr. Willette Cluster.  Good for one year    . XTANDI 40 MG capsule TAKE 4 CAPSULES BY MOUTH DAILY 120 capsule 1   No facility-administered medications prior to visit.   Past Medical History  Diagnosis Date  . Atrial fibrillation   . Other and unspecified hyperlipidemia   . Unspecified essential hypertension   . Malignant neoplasm of prostate 11/2007    Mets to Bone  . Elevated prostate specific antigen (PSA)   . Personal history of colonic polyps   . Hypocalcemia   . Acute alcoholic hepatitis   . Essential and other specified forms of tremor   . Anxiety state, unspecified   . Family history of malignant neoplasm of gastrointestinal tract   . Alcohol abuse with physiological dependence 02/13/2012  . 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 Alcoholic hepatitis with ascites 11/13/2014   Past  Surgical History  Procedure Laterality Date  . Vasectomy    . Cholecystectomy    . Coronary stent placement  2011  . Colonoscopy w/ biopsies     History   Social History  . Marital Status: Married    Spouse Name: N/A  . Number of Children: 3  . Years of Education: N/A   Occupational History  . lawyer     [employer] of [employer] History Main Topics  . Smoking status: Former Smoker    Types: Cigarettes  . Smokeless tobacco: Never Used     Comment: stopped in college  . Alcohol Use: Yes     Comment: has consumed as much as a 1/2 gallon of vodka in a day, now drinks at least 3-5 drinks per day.   . Drug  Use: No  . Sexual Activity: No   Other Topics Concern  . None   Social History Narrative   Patient is married. 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   11/18/2014      Family History  Problem Relation Age of Onset  . Prostate cancer Father   . Coronary artery disease Father   . Colon cancer Father   . Aneurysm Mother        Review of Systems No fevers reported. Denies any urinary problems. He is somewhat weak. He has a cardiac stent, cared for by Dr. Burt Knack he also has history of atrial fibrillation but does not take anticoagulation because of his bleeding risk. He does take Plavix for his cardiac stent. Not currently on aspirin.  Taking Xtandi (anti-androgen) for prostate cancer all other review of systems appear negative at this time.    Objective:   Physical Exam @BP  116/76 mmHg  Pulse 94  Ht 6\' 2"  (1.88 m)  Wt 233 lb (105.688 kg)  BMI 29.90 kg/m2  SpO2 96%@  General:  Jaundiced and chroncally ill Eyes:  icteric. Lungs: Clear to auscultation bilaterally. Heart:  S1S2, no rubs, murmurs, gallops. 2+ Bilat LE edema Abdomen:  distended with moderate ascites at least non-tender, no mass and BS+.  Extremities:    No cyanosis or clubbing Skin   jaundiced Neuro:  A&O x 3. ? Trace asterixis Psych:  appropriate mood and  Affect.   Data Reviewed: Labs, CT scanning and imaging in the EMR. He is hyponatremic and hypokalemic based on labs last week and started him on a potassium supplement. INR was 1.55 days ago.  Lab Results  Component Value Date   ALT 31 11/13/2014   AST 135* 11/13/2014   ALKPHOS 211* 11/13/2014   BILITOT 7.1* 11/13/2014   Lab Results  Component Value Date   CREATININE 0.78 11/13/2014   BUN 5* 11/13/2014   NA 127* 11/13/2014   K 3.0* 11/13/2014   CL 94* 11/13/2014   CO2 26 11/13/2014   Lab Results  Component Value Date   INR 1.5* 11/13/2014   INR 1.02 03/18/2010   INR 7.8 ratio* 03/08/2009    PROTIME 20.8 01/21/2009   PROTIME 19.3 01/18/2009   Lab Results  Component Value Date   WBC 11.7* 11/10/2014   HGB 11.7* 11/10/2014   HCT 34.1* 11/10/2014   MCV 116.8* 11/10/2014   PLT 113* 11/10/2014       Assessment & Plan:   1. Alcoholic hepatitis with ascites   2. Alcoholism   3. Ascites   4. Edema   5. Jaundice  Is a very complicated and ill patient. He is not toxic but he essentially has a life-threatening illness at this time without colic hepatitis. I've explained this to the patient and his wife. I recommended admission for detoxification from alcohol and treatment of his alcoholic hepatitis. Treatment of ascites as well. The etiology of problem seems to be alcohol. After he left the office I did some more research and I think that the anti-androgen treatment for his prostate cancer could be playing some sort of a role. It can cause hyperbilirubinemia. However alcohol over the years is the primary underlying issue I feel certain.  1. Dr. Coralyn Pear of the hospitalist team is kindly accepted to admit him for treatment. 2. [facility]  will follow up by tomorrow for consultative care. 3. At this point he needs chemistries, INR, CBC and ammonia level to evaluate his decompensated liver disease. 4. He will need alcohol detox protocol. Prevention of withdrawal using benzodiazepines. 5. Paracentesis as appropriate so his clopidogrel should be held prior to doing that probably at least 5 days. 6. Would treat with diuretics pending what his electrolytes tell us. 7. Nutrition consult should be undertaken. 8. He should receive judicious IV fluids to avoid further fluid overload 9. Would treat with thiamine and IV multivitamin as well. 10. Consider holding the Xtandia given its antiangiogenic mechanism but would check with oncology before we did that. Again alcohol is the primary problem. 11. Calculate discriminant function to see if prednisone treatment would make sense once we repeat  his labs. 12. 325 mg daily aspirin would seem to make sense given his atrial fibrillation and stent history. Cover with PPI. 13. Further plans pending clinical course in this complicated patient.  Cc: Thurston Pounds, MD; Zola Button, MD, Vassie Moment, MD

## 2014-11-19 ENCOUNTER — Inpatient Hospital Stay (HOSPITAL_COMMUNITY): Payer: Medicare Other

## 2014-11-19 DIAGNOSIS — R1084 Generalized abdominal pain: Secondary | ICD-10-CM

## 2014-11-19 LAB — COMPREHENSIVE METABOLIC PANEL
ALT: 36 U/L (ref 0–53)
AST: 164 U/L — ABNORMAL HIGH (ref 0–37)
Albumin: 2.4 g/dL — ABNORMAL LOW (ref 3.5–5.2)
Alkaline Phosphatase: 187 U/L — ABNORMAL HIGH (ref 39–117)
Anion gap: 6 (ref 5–15)
BUN: 8 mg/dL (ref 6–23)
CALCIUM: 7.9 mg/dL — AB (ref 8.4–10.5)
CHLORIDE: 100 mmol/L (ref 96–112)
CO2: 25 mmol/L (ref 19–32)
Creatinine, Ser: 0.73 mg/dL (ref 0.50–1.35)
Glucose, Bld: 109 mg/dL — ABNORMAL HIGH (ref 70–99)
POTASSIUM: 4.2 mmol/L (ref 3.5–5.1)
Sodium: 131 mmol/L — ABNORMAL LOW (ref 135–145)
Total Bilirubin: 11.4 mg/dL — ABNORMAL HIGH (ref 0.3–1.2)
Total Protein: 7.5 g/dL (ref 6.0–8.3)

## 2014-11-19 LAB — CBC
HCT: 32.2 % — ABNORMAL LOW (ref 39.0–52.0)
HCT: 32.2 % — ABNORMAL LOW (ref 39.0–52.0)
HEMOGLOBIN: 11 g/dL — AB (ref 13.0–17.0)
Hemoglobin: 10.8 g/dL — ABNORMAL LOW (ref 13.0–17.0)
MCH: 40.9 pg — AB (ref 26.0–34.0)
MCH: 40 pg — AB (ref 26.0–34.0)
MCHC: 34.2 g/dL (ref 30.0–36.0)
MCHC: 33.5 g/dL (ref 30.0–36.0)
MCV: 119.7 fL — ABNORMAL HIGH (ref 78.0–100.0)
MCV: 119.3 fL — AB (ref 78.0–100.0)
PLATELETS: 110 10*3/uL — AB (ref 150–400)
Platelets: 101 10*3/uL — ABNORMAL LOW (ref 150–400)
RBC: 2.69 MIL/uL — AB (ref 4.22–5.81)
RBC: 2.7 MIL/uL — ABNORMAL LOW (ref 4.22–5.81)
RDW: 18.4 % — ABNORMAL HIGH (ref 11.5–15.5)
RDW: 18.8 % — AB (ref 11.5–15.5)
WBC: 9.6 10*3/uL (ref 4.0–10.5)
WBC: 8.3 10*3/uL (ref 4.0–10.5)

## 2014-11-19 LAB — BODY FLUID CELL COUNT WITH DIFFERENTIAL
Lymphs, Fluid: 39 %
MONOCYTE-MACROPHAGE-SEROUS FLUID: 60 % (ref 50–90)
Neutrophil Count, Fluid: 1 % (ref 0–25)
Total Nucleated Cell Count, Fluid: 172 cu mm (ref 0–1000)

## 2014-11-19 LAB — AMMONIA: Ammonia: 90 umol/L — ABNORMAL HIGH (ref 11–32)

## 2014-11-19 LAB — PROTEIN, BODY FLUID: Total protein, fluid: <3 g/dL

## 2014-11-19 LAB — PROTIME-INR
INR: 1.71 — ABNORMAL HIGH (ref 0.00–1.49)
Prothrombin Time: 20.2 seconds — ABNORMAL HIGH (ref 11.6–15.2)

## 2014-11-19 LAB — LACTATE DEHYDROGENASE, PLEURAL OR PERITONEAL FLUID: LD FL: 72 U/L — AB (ref 3–23)

## 2014-11-19 LAB — ALBUMIN, FLUID (OTHER): Albumin, Fluid: <1 g/dL

## 2014-11-19 MED ORDER — LORAZEPAM 1 MG PO TABS
1.0000 mg | ORAL_TABLET | ORAL | Status: DC | PRN
  Administered 2014-11-19: 1 mg via ORAL
  Administered 2014-11-19 – 2014-11-20 (×2): 2 mg via ORAL
  Administered 2014-11-20: 1 mg via ORAL
  Administered 2014-11-21 (×3): 2 mg via ORAL
  Filled 2014-11-19: qty 2
  Filled 2014-11-19: qty 1
  Filled 2014-11-19: qty 2
  Filled 2014-11-19: qty 1
  Filled 2014-11-19 (×3): qty 2

## 2014-11-19 MED ORDER — RIFAXIMIN 550 MG PO TABS
550.0000 mg | ORAL_TABLET | Freq: Two times a day (BID) | ORAL | Status: DC
  Administered 2014-11-19 – 2014-11-21 (×5): 550 mg via ORAL
  Filled 2014-11-19 (×6): qty 1

## 2014-11-19 MED ORDER — ENSURE ENLIVE PO LIQD
237.0000 mL | Freq: Two times a day (BID) | ORAL | Status: DC
  Administered 2014-11-19 – 2014-11-24 (×9): 237 mL via ORAL

## 2014-11-19 MED ORDER — LORAZEPAM 2 MG/ML IJ SOLN
1.0000 mg | INTRAMUSCULAR | Status: DC | PRN
  Administered 2014-11-21 – 2014-11-22 (×3): 2 mg via INTRAVENOUS
  Filled 2014-11-19 (×3): qty 1

## 2014-11-19 MED ORDER — METOPROLOL SUCCINATE 12.5 MG HALF TABLET
12.5000 mg | ORAL_TABLET | Freq: Every day | ORAL | Status: DC
  Administered 2014-11-19 – 2014-11-23 (×5): 12.5 mg via ORAL
  Filled 2014-11-19 (×6): qty 1

## 2014-11-19 NOTE — Procedures (Signed)
Successful US guided paracentesis from LLQ.  Yielded 3 liters of serous fluid.  No immediate complications.  Pt tolerated well.   Specimen was sent for labs.  Tsosie Billing D PA-C 11/19/2014 10:11 AM

## 2014-11-19 NOTE — Progress Notes (Signed)
Clinical Social Work Department BRIEF PSYCHOSOCIAL ASSESSMENT 11/19/2014  Patient:  Allen Schroeder, Allen Schroeder     Account Number:  [id number]     Admit date:  11/18/2014  Clinical Social Worker:  Peri Maris, [place]  Date/Time:  11/19/2014 01:00 PM  Referred by:  Physician  Date Referred:  11/19/2014 Referred for  Substance Abuse   Other Referral:   Interview type:  Patient Other interview type:   with wife at bedside    PSYCHOSOCIAL DATA Living Status:  WIFE Admitted from facility:   Level of care:   Primary support name:  Dorian Pod Primary support relationship to patient:  SPOUSE Degree of support available:   Adequate    CURRENT CONCERNS Current Concerns  Substance Abuse   Other Concerns:   Pt's medical conditions which have been exacerbated by alcohol abuse    SOCIAL WORK ASSESSMENT / PLAN CSW met with Pt and wife at bedside to complete assessment and discuss substance abuse and treatment options. CSW assessed Pt pattern of use. Pt reported consuming 5-6 alcoholic drinks throughout the day. He describes his reason for drinking as a coping mechanism for his struggle with generalized anxiety disorder. Pt reports that he is not currently and has never been on any medication to assist with managing his generalized anxiety symptoms but expresses a desire to start medication. Pt reports that he feels medication to help cope with anxiety is imperative to his recovery. He also reports that his current health condition is a motivating factor for his seeking further treatment. Pt expressed that he has been to residential rehab three times in the past but feels that his age and physical condition would not be compatible for residential treatment.    CSW provided Pt and wife with list of community resources related to substance abuse treatment. CSW will follow-up with Pt to continue assisting with referral process.   Assessment/plan status:  Referral to [employer] Other  assessment/ plan:   Information/referral to community resources:   List of substance abuse treatment resources.    PATIENT'S/FAMILY'S RESPONSE TO PLAN OF CARE: Pt was pleasant and cooperative during assessment, forthcoming with information regarding history and current use. Pt and wife both concerned with the progression of Pt addiction. Both also verbalized being invested in treatment options. Pt describes feeling motivated for treatment and expresses that his family and wife are very supportive. He is open and agreeable to exploring further treatment options.     Peri Maris, LCSWA 11/19/2014 5:03 PM [phone]

## 2014-11-19 NOTE — Progress Notes (Signed)
Patient ID: Allen Schroeder, male   DOB: Dec 27, 1950, 64 y.o.   MRN: [id number]  [facility] PROGRESS NOTE  Allen Schroeder PYK:[id number] DOB: Jan 03, 1951 DOA: 11/18/2014 PCP: Allen Rusk, MD   Brief narrative:    64 year old male with known metastatic prostate cancer, under the care of Dr. Alen Schroeder, long history of alcohol abuse, still currently drinking, presented to Dr. Celesta Schroeder office earlier today prior to this admission with main concern of progressively worsening swelling in the feet and ankles, abdomen, fatigue and poor oral intake. In addition patient reported diffuse body pains over the past several weeks. Patient denies any specific symptom like chest pain or shortness of breath, denies fevers or chills. In the office, patient notably jaundiced with swelling in feet and ankles, lower extremities, abdomen distended. Tried hospitalist asked to admit for further evaluation and management. Please note that patient is currently rather tired and fairly difficult to obtain detailed history from. Per earlier report review, wife reported that patient has been through alcohol rehabilitation but that has not work as patient is still actively drinking and wife noted some episodes of confusion at home.  Assessment/Plan:    Active Problems:  Alcoholic hepatitis with ascites - appreciate IR assistance, pt is now status post paracentesis with 3 L fluid removed and sent for analysis  - placed on lasix and spironolactone but will need close monitoring of vital signs due to SBP in 90's - appreciate GI team recommendations  Transaminitis with hyperbilirubinemia - alcohol induced hepatitis, LFT's overall stable but bili trending up  - Jaundiced skin, continue to monitor LFTs   Acute and intermittent encephalopathy - hepatic encephalopathy - ammonia level elevated - diarrhea precludes use of lactulose - start rifaximin   Metastatic prostate cancer  - notified oncologist of patient's admission  -  holding Xtandia   Hyperlipidemia - Hold statin until liver function tests stabilize   Diarrhea - C. Diff pending    Thrombocytopenia - from alcohol induced bone marrow damage - no signs of bleeding - repeat CBC in AM   Severe PCM - in the setting of metastatic prostate cancer and alcohol use, poor oral intake - nutritionist consulted  - provide ensure   Hyponatremia - Secondary to dehydration in the setting of alcohol intoxication - difficult to give IVF with ascites and LE edema  - Na is trending up from 127 --> 130 --> 131 - repeat BMP in AM   Anemia of chronic disease, malignancy and alcohol use - no sings of bleeding - repeat CBC in MA  Alcohol intoxication in patient with known chronic alcohol abuse - Level greater than 136 - Placed on CIWA protocol, continue thiamine, MVI, folate  - Monitor closely for signs of withdrawal  Hypertension, Essential - stop sotalol, lower the dose of metoprolol form 25 mg QD to 12.5 mg QD as SBP is in 90's - added spironolactone and lasix   DVT prophylaxis - SCDs  Code Status: Full.  Family Communication:  plan of care discussed with the patient Disposition Plan: Home when stable.   IV access:  Peripheral IV  Procedures and diagnostic studies:    Ct Abdomen Pelvis W Contrast   11/11/2014   Hepatosplenomegaly with geographic fatty sparing or inflammation in the liver, potentially a result of cirrhosis or hepatitis. The degree of narrowing of the hepatic veins and intrahepatic IVC is worsened suggesting acute swelling of the liver. No vascular thrombosis observed. 2. Ascites. 3. Probably reactive adenopathy in the gastrohepatic ligament and porta  hepatis. Mesenteric edema noted. 4. There is some mild irregularity of the lumen in the cecum. Cecal mass not readily excluded. In the nonacute setting consider colonoscopy if not recently performed. 5. Scattered sclerotic lesions from prior metastatic disease. Compared to the 2013 CT scan, I do  see a new small sclerotic metastatic lesion anteriorly in the right L2 vertebra. 6. Atherosclerosis, including the coronary arteries. 7. Scattered atelectasis in the lung bases.    US Paracentesis   11/19/2014  Successful ultrasound-guided paracentesis yielding 3 liters of peritoneal fluid.   Medical Consultants:  GI IR  Other Consultants:  None  IAnti-Infectives:   None   Allen Ramsay, MD  [facility] Pager [phone]  If 7PM-7AM, please contact night-coverage [email or web address] Password [email or web address] 11/19/2014, 1:52 PM   LOS: 1 day   HPI/Subjective: No events overnight.   Objective: Filed Vitals:   11/19/14 0915 11/19/14 0930 11/19/14 1010 11/19/14 1316  BP: 120/68 130/80 132/68 94/57  Pulse:   84 69  Temp:   98.1 F (36.7 C) 98.4 F (36.9 C)  TempSrc:   Oral Oral  Resp:   28 20  Height:      Weight:      SpO2:   94% 98%    Intake/Output Summary (Last 24 hours) at 11/19/14 1352 Last data filed at 11/18/14 1700  Gross per 24 hour  Intake      0 ml  Output      0 ml  Net      0 ml    Exam:   General:  Pt is alert, follows commands appropriately, not in acute distress, jaundiced skin   Cardiovascular: Regular rate and rhythm, S1/S2, no murmurs, no rubs, no gallops  Respiratory: Clear to auscultation bilaterally, no wheezing, diminished breath sounds at bases   Abdomen: Soft, non tender, slightly distended, bowel sounds present, no guarding  Extremities: +2 bilateral LE pitting edema, pulses DP and PT palpable bilaterally  Data Reviewed: Basic Metabolic Panel:  Recent Labs Lab 11/13/14 1616 11/18/14 1407 11/19/14 0525  NA 127* 130* 131*  K 3.0* 3.8 4.2  CL 94* 98 100  CO2 26 21 25   GLUCOSE 96 94 109*  BUN 5* 7 8  CREATININE 0.78 0.64 0.73  CALCIUM 7.5* 7.2* 7.9*  MG  --  2.0  --    Liver Function Tests:  Recent Labs Lab 11/13/14 1616 11/18/14 1407 11/19/14 0525  AST 135* 160* 164*  ALT 31 35 36  ALKPHOS 211* 205* 187*  BILITOT 7.1* 8.5* 11.4*   PROT 8.0 7.5 7.5  ALBUMIN 2.8* 2.4* 2.4*    Recent Labs Lab 11/18/14 1810 11/19/14 1039  AMMONIA 39* 90*   CBC:  Recent Labs Lab 11/18/14 1912 11/19/14 0525 11/19/14 1039  WBC 9.1 9.6 8.3  HGB 11.0* 11.0* 10.8*  HCT 31.8* 32.2* 32.2*  MCV 117.3* 119.7* 119.3*  PLT 113* 101* 110*    Scheduled Meds: . feeding supplement (ENSURE ENLIVE)  237 mL Oral BID BM  . folic acid  1 mg Oral Daily  . furosemide  20 mg Intravenous Daily  . metoprolol succinate  25 mg Oral Daily  . multivitamin with minerals  1 tablet Oral Daily  . potassium chloride SA  20 mEq Oral BID  . sodium chloride  3 mL Intravenous Q12H  . sotalol  120 mg Oral Q12H  . spironolactone  25 mg Oral Daily  . thiamine  100 mg Oral Daily   Or  . thiamine  100 mg Intravenous  Daily   Continuous Infusions:

## 2014-11-19 NOTE — Progress Notes (Signed)
UR complete 

## 2014-11-19 NOTE — Progress Notes (Signed)
INITIAL NUTRITION ASSESSMENT  DOCUMENTATION CODES Per approved criteria  -Not Applicable   INTERVENTION: - Ensure Enlive po BID, each supplement provides 350 kcal and 20 grams of protein - Continue multivitamin and thiamin supplements  - RD will continue to monitor  NUTRITION DIAGNOSIS: Increased nutrient needs related to protein and calories as evidenced by alcoholic hepatitis with ascites.   Goal: Pt to meet >/= 90% of their estimated nutrition needs   Monitor:  Weight trend, po intake, I/O's, labs  Reason for Assessment: Consult for nutrition assessment  64 y.o. male  Admitting Dx: <principal problem not specified>  ASSESSMENT: 64 year old male with known metastatic prostate cancer, under the care of Dr. [name], long history of alcohol abuse, still currently drinking, presented to Dr. Celesta Aver office earlier today prior to this admission with main concern of progressively worsening swelling in the feet and ankles, abdomen, fatigue and poor oral intake.  - Pt with alcoholic hepatitis with ascites.  - Pt reports that he has been eating poorly PTA. Today he had yogurt and tea. He says that his appetite is improving.  - Wt loss r/t paracentesis (3L) this am. Unsure of dry weight. - Pt agreed to nutritional supplements to improve po intake.  - Mild muscle depletion at pt's temples.  - Labs and medications reviewed  Na 131  K WNL  Albumin 2.4  Height: Ht Readings from Last 1 Encounters:  11/18/14 6\' 2"  (1.88 m)    Weight: Wt Readings from Last 1 Encounters:  11/19/14 206 lb 2.1 oz (93.5 kg)    Ideal Body Weight: 82.2 kg  % Ideal Body Weight: 114%  Wt Readings from Last 10 Encounters:  11/19/14 206 lb 2.1 oz (93.5 kg)  11/18/14 233 lb (105.688 kg)  11/10/14 230 lb 11.2 oz (104.645 kg)  08/25/14 231 lb 11.2 oz (105.098 kg)  06/22/14 231 lb 1.9 oz (104.835 kg)  05/27/14 232 lb 12.8 oz (105.597 kg)  03/05/14 235 lb 8 oz (106.822 kg)  11/27/13 230 lb 12.8 oz  (104.69 kg)  10/24/13 230 lb (104.327 kg)  06/24/13 226 lb (102.513 kg)    Usual Body Weight: ~230 lbs  % Usual Body Weight: 90%  BMI:  Body mass index is 26.45 kg/(m^2).  Estimated Nutritional Needs: Kcal: 2300-2500 Protein: 110-120 g Fluid: Per MD  Skin: WNL  Diet Order: Diet 2 gram sodium Room service appropriate?: Yes; Fluid consistency:: Thin  EDUCATION NEEDS: -Education needs addressed   Intake/Output Summary (Last 24 hours) at 11/19/14 1126 Last data filed at 11/18/14 1700  Gross per 24 hour  Intake      0 ml  Output      0 ml  Net      0 ml    Last BM: 4/14   Labs:   Recent Labs Lab 11/13/14 1616 11/18/14 1407 11/19/14 0525  NA 127* 130* 131*  K 3.0* 3.8 4.2  CL 94* 98 100  CO2 26 21 25   BUN 5* 7 8  CREATININE 0.78 0.64 0.73  CALCIUM 7.5* 7.2* 7.9*  MG  --  2.0  --   GLUCOSE 96 94 109*    CBG (last 3)  No results for input(s): GLUCAP in the last 72 hours.  Scheduled Meds: . folic acid  1 mg Oral Daily  . furosemide  20 mg Intravenous Daily  . metoprolol succinate  25 mg Oral Daily  . multivitamin with minerals  1 tablet Oral Daily  . potassium chloride SA  20 mEq Oral  BID  . sodium chloride  3 mL Intravenous Q12H  . sotalol  120 mg Oral Q12H  . spironolactone  25 mg Oral Daily  . thiamine  100 mg Oral Daily   Or  . thiamine  100 mg Intravenous Daily    Continuous Infusions:   Past Medical History  Diagnosis Date  . Atrial fibrillation   . Other and unspecified hyperlipidemia   . Unspecified essential hypertension   . Malignant neoplasm of prostate 11/2007    Mets to Bone  . Elevated prostate specific antigen (PSA)   . Personal history of colonic polyps   . Hypocalcemia   . Acute alcoholic hepatitis   . Essential and other specified forms of tremor   . Anxiety state, unspecified   . Family history of malignant neoplasm of gastrointestinal tract   . Alcohol abuse with physiological dependence 02/13/2012  .  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 Alcoholic hepatitis with ascites 11/13/2014    Past Surgical History  Procedure Laterality Date  . Vasectomy    . Cholecystectomy    . Coronary stent placement  2011  . Colonoscopy w/ biopsies      Laurette Schimke Weir, [place], [place]

## 2014-11-19 NOTE — Progress Notes (Signed)
Patient had 3 loose stools.  Per policy patient will be placed on precautions, and c diff pcr will be sent.  Durwin Nora RN

## 2014-11-19 NOTE — Progress Notes (Signed)
Patient ID: Allen Schroeder, male   DOB: 22-Apr-1951, 64 y.o.   MRN: [id number]    Progress Note   Subjective  Feels ok, a little tremulous. Eating. Had paracentesis done this am-3 liters, tolerated well   Objective   Vital signs in last 24 hours: Temp:  [97.9 F (36.6 C)-98.6 F (37 C)] 98.1 F (36.7 C) (04/14 0730) Pulse Rate:  [72-94] 72 (04/14 0730) Resp:  [10-24] 24 (04/14 0730) BP: (116-148)/(68-85) 120/68 mmHg (04/14 0915) SpO2:  [93 %-98 %] 93 % (04/14 0730) Weight:  [206 lb (93.441 kg)-233 lb (105.688 kg)] 206 lb 2.1 oz (93.5 kg) (04/14 0500) Last BM Date: 11/18/14 General:  Jaundiced white male   in NAD Heart:  Regular rate and rhythm; no murmurs Lungs: Respirations even and unlabored, lungs CTA bilaterally Abdomen:  Soft, , nontender, + ascites  Extremities:  Without edema. Neurologic:  Alert and oriented,  grossly normal neurologically-tremulous Psych:  Cooperative. Normal mood and affect.  Intake/Output from previous day:   Intake/Output this shift:    Lab Results:  Recent Labs  11/18/14 1912 11/19/14 0525  WBC 9.1 9.6  HGB 11.0* 11.0*  HCT 31.8* 32.2*  PLT 113* 101*   BMET  Recent Labs  11/18/14 1407 11/19/14 0525  NA 130* 131*  K 3.8 4.2  CL 98 100  CO2 21 25  GLUCOSE 94 109*  BUN 7 8  CREATININE 0.64 0.73  CALCIUM 7.2* 7.9*   LFT  Recent Labs  11/19/14 0525  PROT 7.5  ALBUMIN 2.4*  AST 164*  ALT 36  ALKPHOS 187*  BILITOT 11.4*   PT/INR  Recent Labs  11/18/14 1810  LABPROT 18.9*  INR 1.56*      Assessment / Plan:    #1 64 yo male with cirrhosis, alcoholic hepatitis - discriminant function =28.4, does not meet criteria for steroids. #2 Ascites - paracentesis this am-await fluid #3 ETOH withdrawal - on withdrawal  Protocol #4 metastatic prostate cancer #5 atrial fib - had been on plavix  Will continue to follow with you  Active Problems:   Alcoholic hepatitis with ascites   Abdominal pain    LOS: 1 day   Amy  [place]  11/19/2014, 9:41 AM      Attending physician's note   I have taken an interval history, reviewed the chart and examined the patient. I agree with the Advanced Practitioner's note, impression and recommendations. Await ascites fluid studies. Continue supportive mgmt for cirrhosis with decompensation, ascites, etoh hepatitis, etoh withdrawal.  Norberto Sorenson T. Fuller Plan, MD [facility]

## 2014-11-19 NOTE — Progress Notes (Signed)
Events noted. Patient admitted related to decompensated liver failure and currently on Xtandi. I have recommended that stopping the [facility] while he is hospitalized we can certainly resumed that as an outpatient basis. I do not think this medication is contributing to his liver disease but certainly it would be reasonable to stop at the time being.

## 2014-11-20 ENCOUNTER — Encounter (HOSPITAL_COMMUNITY): Payer: Self-pay

## 2014-11-20 DIAGNOSIS — F10239 Alcohol dependence with withdrawal, unspecified: Secondary | ICD-10-CM | POA: Insufficient documentation

## 2014-11-20 DIAGNOSIS — R188 Other ascites: Secondary | ICD-10-CM

## 2014-11-20 DIAGNOSIS — F10939 Alcohol use, unspecified with withdrawal, unspecified: Secondary | ICD-10-CM | POA: Insufficient documentation

## 2014-11-20 LAB — COMPREHENSIVE METABOLIC PANEL
ALBUMIN: 2.2 g/dL — AB (ref 3.5–5.2)
ALT: 32 U/L (ref 0–53)
ANION GAP: 6 (ref 5–15)
AST: 142 U/L — AB (ref 0–37)
Alkaline Phosphatase: 155 U/L — ABNORMAL HIGH (ref 39–117)
BUN: 13 mg/dL (ref 6–23)
CHLORIDE: 101 mmol/L (ref 96–112)
CO2: 24 mmol/L (ref 19–32)
CREATININE: 0.69 mg/dL (ref 0.50–1.35)
Calcium: 7.7 mg/dL — ABNORMAL LOW (ref 8.4–10.5)
GFR calc Af Amer: >90 mL/min (ref >90)
GFR calc non Af Amer: >90 mL/min (ref >90)
GLUCOSE: 90 mg/dL (ref 70–99)
POTASSIUM: 4 mmol/L (ref 3.5–5.1)
Sodium: 131 mmol/L — ABNORMAL LOW (ref 135–145)
TOTAL PROTEIN: 7.1 g/dL (ref 6.0–8.3)
Total Bilirubin: 11.7 mg/dL — ABNORMAL HIGH (ref 0.3–1.2)

## 2014-11-20 LAB — PROTIME-INR
INR: 1.7 — ABNORMAL HIGH (ref 0.00–1.49)
PROTHROMBIN TIME: 20.2 s — AB (ref 11.6–15.2)

## 2014-11-20 LAB — CBC
HEMATOCRIT: 30.2 % — AB (ref 39.0–52.0)
Hemoglobin: 10.2 g/dL — ABNORMAL LOW (ref 13.0–17.0)
MCH: 40.8 pg — ABNORMAL HIGH (ref 26.0–34.0)
MCHC: 33.8 g/dL (ref 30.0–36.0)
MCV: 120.8 fL — AB (ref 78.0–100.0)
Platelets: 98 10*3/uL — ABNORMAL LOW (ref 150–400)
RBC: 2.5 MIL/uL — ABNORMAL LOW (ref 4.22–5.81)
RDW: 19.2 % — AB (ref 11.5–15.5)
WBC: 9.1 10*3/uL (ref 4.0–10.5)

## 2014-11-20 LAB — AMMONIA: AMMONIA: 50 umol/L — AB (ref 11–32)

## 2014-11-20 MED ORDER — SPIRONOLACTONE 50 MG PO TABS
50.0000 mg | ORAL_TABLET | Freq: Every day | ORAL | Status: DC
  Administered 2014-11-21: 50 mg via ORAL
  Filled 2014-11-20 (×2): qty 1

## 2014-11-20 NOTE — Progress Notes (Signed)
Patient ID: NNAMDI DACUS, male   DOB: October 01, 1950, 64 y.o.   MRN: [id number]  [facility] PROGRESS NOTE  KEWON STATLER FUX:[id number] DOB: Feb 08, 1951 DOA: 11/18/2014 PCP: Silvano Rusk, MD   Brief narrative:    64 year old male with known metastatic prostate cancer, under the care of Dr. [name], long history of alcohol abuse, still currently drinking, presented to Dr. Celesta Aver office earlier today prior to this admission with main concern of progressively worsening swelling in the feet and ankles, abdomen, fatigue and poor oral intake. In addition patient reported diffuse body pains over the past several weeks. Patient denies any specific symptom like chest pain or shortness of breath, denies fevers or chills. In the office, patient notably jaundiced with swelling in feet and ankles, lower extremities, abdomen distended. Tried hospitalist asked to admit for further evaluation and management. Please note that patient is currently rather tired and fairly difficult to obtain detailed history from. Per earlier report review, wife reported that patient has been through alcohol rehabilitation but that has not work as patient is still actively drinking and wife noted some episodes of confusion at home.  Assessment/Plan:    Active Problems:  Alcoholic hepatitis with ascites - appreciate IR assistance, pt is now status post paracentesis with 3 L fluid removed and sent for analysis  - Abdomen looks less distended on exam this morning, weight is up from 206 lbs to 213 lbs this morning - appreciate GI team recommendations - Continue diuretics spironolactone and Lasix - Since blood pressure is stable this morning, increase the dose of spironolactone from 25 mg daily to 50 mg daily  Transaminitis with hyperbilirubinemia - alcohol induced hepatitis, LFT's overall unchanged, slightly better - Bilirubin remains elevated - Jaundiced skin, continue to monitor LFTs   Acute and intermittent  encephalopathy - hepatic encephalopathy - ammonia level elevated but continues trending down - diarrhea precludes use of lactulose - Continue rifaximin - Repeat ammonia level in the morning  Metastatic prostate cancer  - notified oncologist of patient's admission  - holding Xtandia   Hyperlipidemia - Holding statin    Diarrhea - C. Diff pending    Thrombocytopenia - from alcohol induced bone marrow damage - Platelets slightly down over the past 24 hours - no signs of bleeding - repeat CBC in AM   Severe PCM - in the setting of metastatic prostate cancer and alcohol use, poor oral intake - nutritionist consulted  - provide ensure    Coagulopathy - With INR 1.7 in the setting of alcoholic liver disease  Hyponatremia - Secondary to dehydration in the setting of alcohol intoxication - difficult to give IVF with ascites and LE edema  - Na is trending up from 127 --> 130 --> 131 - repeat BMP in AM   Anemia of chronic disease, malignancy and alcohol use - no sings of bleeding - repeat CBC in AM  Alcohol intoxication in patient with known chronic alcohol abuse - Level greater than 136 - Placed on CIWA protocol, continue thiamine, MVI, folate  - Monitor closely for signs of withdrawal - Repeat alcohol level in the morning  Hypertension, Essential - Stop sotalol and lower the dose of metoprolol from 25 mg QD to 12.5 mg QD on 11/19/14 - Continue spironolactone and Lasix  DVT prophylaxis - SCDs  Code Status: Full.  Family Communication:  plan of care discussed with the patient, wife at bedside Disposition Plan: Home when stable.   IV access:  Peripheral IV  Procedures and diagnostic studies:  Ct Abdomen Pelvis W Contrast   11/11/2014   Hepatosplenomegaly with geographic fatty sparing or inflammation in the liver, potentially a result of cirrhosis or hepatitis. The degree of narrowing of the hepatic veins and intrahepatic IVC is worsened suggesting acute swelling of the  liver. No vascular thrombosis observed. 2. Ascites. 3. Probably reactive adenopathy in the gastrohepatic ligament and porta hepatis. Mesenteric edema noted. 4. There is some mild irregularity of the lumen in the cecum. Cecal mass not readily excluded. In the nonacute setting consider colonoscopy if not recently performed. 5. Scattered sclerotic lesions from prior metastatic disease. Compared to the 2013 CT scan, I do see a new small sclerotic metastatic lesion anteriorly in the right L2 vertebra. 6. Atherosclerosis, including the coronary arteries. 7. Scattered atelectasis in the lung bases.    US Paracentesis   11/19/2014  Successful ultrasound-guided paracentesis yielding 3 liters of peritoneal fluid.   Medical Consultants:  GI IR  Other Consultants:  None  IAnti-Infectives:   None   Faye Ramsay, MD  [facility] Pager [phone]  If 7PM-7AM, please contact night-coverage [email or web address] Password [email or web address] 11/20/2014, 12:51 PM   LOS: 2 days   HPI/Subjective: Patient reports he feels better, abdomen still distended. Persistent diarrhea  Objective: Filed Vitals:   11/19/14 1316 11/19/14 1631 11/19/14 2155 11/20/14 0543  BP: 94/57 104/70 109/65 115/69  Pulse: 69  74 72  Temp: 98.4 F (36.9 C)  98.4 F (36.9 C) 98.5 F (36.9 C)  TempSrc: Oral  Oral Oral  Resp: 20  16 18   Height:      Weight:    96.934 kg (213 lb 11.2 oz)  SpO2: 98%  95% 94%    Intake/Output Summary (Last 24 hours) at 11/20/14 1251 Last data filed at 11/19/14 1400  Gross per 24 hour  Intake    240 ml  Output      0 ml  Net    240 ml    Exam:   General:  Pt is alert, follows commands appropriately, not in acute distress, jaundiced skin   Cardiovascular: Regular rate and rhythm, S1/S2, no murmurs, no rubs, no gallops  Respiratory: Clear to auscultation bilaterally, no wheezing, diminished breath sounds at bases   Abdomen: Soft, non tender, slightly distended, bowel sounds present, no  guarding  Extremities: +2 bilateral LE pitting edema, pulses DP and PT palpable bilaterally  Data Reviewed: Basic Metabolic Panel:  Recent Labs Lab 11/13/14 1616 11/18/14 1407 11/19/14 0525 11/20/14 0550  NA 127* 130* 131* 131*  K 3.0* 3.8 4.2 4.0  CL 94* 98 100 101  CO2 26 21 25 24   GLUCOSE 96 94 109* 90  BUN 5* 7 8 13   CREATININE 0.78 0.64 0.73 0.69  CALCIUM 7.5* 7.2* 7.9* 7.7*  MG  --  2.0  --   --    Liver Function Tests:  Recent Labs Lab 11/13/14 1616 11/18/14 1407 11/19/14 0525 11/20/14 0550  AST 135* 160* 164* 142*  ALT 31 35 36 32  ALKPHOS 211* 205* 187* 155*  BILITOT 7.1* 8.5* 11.4* 11.7*  PROT 8.0 7.5 7.5 7.1  ALBUMIN 2.8* 2.4* 2.4* 2.2*    Recent Labs Lab 11/18/14 1810 11/19/14 1039 11/20/14 0550  AMMONIA 39* 90* 50*   CBC:  Recent Labs Lab 11/18/14 1912 11/19/14 0525 11/19/14 1039 11/20/14 0550  WBC 9.1 9.6 8.3 9.1  HGB 11.0* 11.0* 10.8* 10.2*  HCT 31.8* 32.2* 32.2* 30.2*  MCV 117.3* 119.7* 119.3* 120.8*  PLT 113* 101* 110* 98*  Scheduled Meds: . feeding supplement (ENSURE ENLIVE)  237 mL Oral BID BM  . folic acid  1 mg Oral Daily  . furosemide  20 mg Intravenous Daily  . metoprolol succinate  12.5 mg Oral Daily  . multivitamin with minerals  1 tablet Oral Daily  . potassium chloride SA  20 mEq Oral BID  . rifaximin  550 mg Oral BID  . sodium chloride  3 mL Intravenous Q12H  . spironolactone  25 mg Oral Daily  . thiamine  100 mg Oral Daily   Or  . thiamine  100 mg Intravenous Daily   Continuous Infusions:

## 2014-11-20 NOTE — Progress Notes (Addendum)
Patient ID: BURNETT SPRAY, male   DOB: 03-Dec-1950, 64 y.o.   MRN: [id number]    Progress Note   Subjective  Long discussion with wife this am and long discussion with pt by himself now. He has been thru rehab 3 times in the past and does believe the 12 step approach works for him. He does not want to go to inpt rehab. He feels anxiety will be his main hurdle after discharge. He feels ok-less tremulous   Objective   Vital signs in last 24 hours: Temp:  [98.1 F (36.7 C)-98.5 F (36.9 C)] 98.5 F (36.9 C) (04/15 0543) Pulse Rate:  [69-84] 72 (04/15 0543) Resp:  [16-28] 18 (04/15 0543) BP: (94-134)/(57-80) 115/69 mmHg (04/15 0543) SpO2:  [94 %-98 %] 94 % (04/15 0543) Weight:  [213 lb 11.2 oz (96.934 kg)] 213 lb 11.2 oz (96.934 kg) (04/15 0543) Last BM Date: 11/18/14 General:    Jaundiced WM in NAD Heart:  Regular rate and rhythm; no murmurs Lungs: Respirations even and unlabored, lungs CTA bilaterally Abdomen:  Soft, nontense ascites, nontender, Normal bowel sounds. Extremities:  Without edema. Neurologic:  Alert and oriented,  grossly normal neurologically-sl tremor. Psych:  Cooperative. Normal mood and affect.  Intake/Output from previous day: 04/14 0701 - 04/15 0700 In: 240 [P.O.:240] Out: -  Intake/Output this shift:    Lab Results:  Recent Labs  11/19/14 0525 11/19/14 1039 11/20/14 0550  WBC 9.6 8.3 9.1  HGB 11.0* 10.8* 10.2*  HCT 32.2* 32.2* 30.2*  PLT 101* 110* 98*   BMET  Recent Labs  11/18/14 1407 11/19/14 0525 11/20/14 0550  NA 130* 131* 131*  K 3.8 4.2 4.0  CL 98 100 101  CO2 21 25 24   GLUCOSE 94 109* 90  BUN 7 8 13   CREATININE 0.64 0.73 0.69  CALCIUM 7.2* 7.9* 7.7*   LFT  Recent Labs  11/20/14 0550  PROT 7.1  ALBUMIN 2.2*  AST 142*  ALT 32  ALKPHOS 155*  BILITOT 11.7*   PT/INR  Recent Labs  11/19/14 1817 11/20/14 0550  LABPROT 20.2* 20.2*  INR 1.71* 1.70*    Studies/Results: US Paracentesis  11/19/2014   INDICATION:  Ascites, request for diagnostic and therapeutic paracentesis.  EXAM: ULTRASOUND-GUIDED PARACENTESIS  COMPARISON:  CT Abdomen/Pelvis 11/11/14.  MEDICATIONS: None.  COMPLICATIONS: None immediate  TECHNIQUE: Informed written consent was obtained from the patient after a discussion of the risks, benefits and alternatives to treatment. A timeout was performed prior to the initiation of the procedure.  Initial ultrasound scanning demonstrates a moderate amount of ascites within the left lower abdominal quadrant. The left lower abdomen was prepped and draped in the usual sterile fashion. 1% lidocaine was used for local anesthesia. Under direct ultrasound guidance, a 19 gauge, 7-cm, Yueh catheter was introduced. An ultrasound image was saved for documentation purposed. The paracentesis was performed. The catheter was removed and a dressing was applied. The patient tolerated the procedure well without immediate post procedural complication.  FINDINGS: A total of approximately 3 liters of serous fluid was removed. Samples were sent to the laboratory as requested by the clinical team.  IMPRESSION: Successful ultrasound-guided paracentesis yielding 3 liters of peritoneal fluid.  Read By:  Tsosie Billing PA-C   Electronically Signed   By: Sandi Mariscal M.D.   On: 11/19/2014 10:13   Ascitic fluid: WBC=172, albumin < 1.0  Serum albumin=2.4 SAAG at least 1.4    Assessment / Plan:   #1 63 yo male alcoholic with acute  ETOH hepatitis complicated by new ascites and jaundice  peritoneal fluid negative- not consistent with SBP, cytology pending #2 ETOH withdrawal- continue protocol #3 CAD-s/p stent- on chronic plavix #4 Ascites- does not need another paracentesis- needs 2gm sodium diet and continued diuresis #5 chronic anxiety-dispo- will discuss with Dr Carlean Purl- Librium may be good choice initially.  Active Problems:   Alcoholic hepatitis with ascites   Abdominal pain    LOS: 2 days   Amy Esterwood  11/20/2014, 8:56  AM      Attending physician's note   I have taken an interval history, reviewed the chart and examined the patient. I agree with the Advanced Practitioner's note, impression and recommendations. Two very long interactions with pt and his wife today at separate times.  Alcoholic hepatitis with ascites and jaundice. Alcohol withdrawal. Alcoholism. Probably has underlying cirrhosis.  Continue supportive care for alcohol withdrawal and alcoholic hepatitis. Continue furosemide, spironolactone and low Na diet for ascites mgmt. Monitor LFTs, PT/INR, CBC, BMET. Advised to completely discontinue alcohol use, forever. Needs alcohol rehab program. Anxiety mgmt is needed long term. He needs a PCP. We will plan to see again on Monday. Please call over weekend if needed.   Pricilla Riffle. Fuller Plan, MD [facility]

## 2014-11-21 LAB — CBC
HEMATOCRIT: 32.7 % — AB (ref 39.0–52.0)
HEMOGLOBIN: 10.9 g/dL — AB (ref 13.0–17.0)
MCH: 40.5 pg — AB (ref 26.0–34.0)
MCHC: 33.3 g/dL (ref 30.0–36.0)
MCV: 121.6 fL — AB (ref 78.0–100.0)
Platelets: 108 10*3/uL — ABNORMAL LOW (ref 150–400)
RBC: 2.69 MIL/uL — ABNORMAL LOW (ref 4.22–5.81)
RDW: 19.1 % — ABNORMAL HIGH (ref 11.5–15.5)
WBC: 10.5 10*3/uL (ref 4.0–10.5)

## 2014-11-21 LAB — PROTIME-INR
INR: 1.63 — ABNORMAL HIGH (ref 0.00–1.49)
Prothrombin Time: 19.4 seconds — ABNORMAL HIGH (ref 11.6–15.2)

## 2014-11-21 LAB — COMPREHENSIVE METABOLIC PANEL
ALK PHOS: 169 U/L — AB (ref 39–117)
ALT: 36 U/L (ref 0–53)
ANION GAP: 6 (ref 5–15)
AST: 150 U/L — ABNORMAL HIGH (ref 0–37)
Albumin: 2.4 g/dL — ABNORMAL LOW (ref 3.5–5.2)
BUN: 18 mg/dL (ref 6–23)
CO2: 26 mmol/L (ref 19–32)
Calcium: 8 mg/dL — ABNORMAL LOW (ref 8.4–10.5)
Chloride: 100 mmol/L (ref 96–112)
Creatinine, Ser: 0.75 mg/dL (ref 0.50–1.35)
GFR calc Af Amer: >90 mL/min (ref >90)
GLUCOSE: 92 mg/dL (ref 70–99)
POTASSIUM: 4.5 mmol/L (ref 3.5–5.1)
Sodium: 132 mmol/L — ABNORMAL LOW (ref 135–145)
TOTAL PROTEIN: 7.6 g/dL (ref 6.0–8.3)
Total Bilirubin: 12 mg/dL — ABNORMAL HIGH (ref 0.3–1.2)

## 2014-11-21 LAB — AMMONIA: AMMONIA: 51 umol/L — AB (ref 11–32)

## 2014-11-21 LAB — ETHANOL: Alcohol, Ethyl (B): <5 mg/dL (ref 0–9)

## 2014-11-21 MED ORDER — RIFAXIMIN 550 MG PO TABS
550.0000 mg | ORAL_TABLET | Freq: Three times a day (TID) | ORAL | Status: DC
  Administered 2014-11-21 (×2): 550 mg via ORAL
  Filled 2014-11-21 (×5): qty 1

## 2014-11-21 MED ORDER — LACTULOSE 10 GM/15ML PO SOLN
10.0000 g | Freq: Two times a day (BID) | ORAL | Status: DC
  Administered 2014-11-21 – 2014-11-24 (×7): 10 g via ORAL
  Filled 2014-11-21 (×8): qty 15

## 2014-11-21 NOTE — Progress Notes (Signed)
Patient ID: Allen Schroeder, male   DOB: 11-26-1950, 64 y.o.   MRN: [id number]  [facility] PROGRESS NOTE  Allen Schroeder GGY:[id number] DOB: 07/04/1951 DOA: 11/18/2014 PCP: Silvano Rusk, MD   Brief narrative:    64 year old male with known metastatic prostate cancer, under the care of Dr. [name], long history of alcohol abuse, still currently drinking, presented to Dr. Celesta Aver office earlier today prior to this admission with main concern of progressively worsening swelling in the feet and ankles, abdomen, fatigue and poor oral intake. In addition patient reported diffuse body pains over the past several weeks. Patient denies any specific symptom like chest pain or shortness of breath, denies fevers or chills. In the office, patient notably jaundiced with swelling in feet and ankles, lower extremities, abdomen distended. Tried hospitalist asked to admit for further evaluation and management. Please note that patient is currently rather tired and fairly difficult to obtain detailed history from. Per earlier report review, wife reported that patient has been through alcohol rehabilitation but that has not work as patient is still actively drinking and wife noted some episodes of confusion at home.  Assessment/Plan:    Active Problems:  Alcoholic hepatitis with ascites - appreciate IR assistance, pt is now status post paracentesis with 3 L fluid removed and sent for analysis  - Abdomen looks less distended on exam this morning, weight is trending down from 213 lbs --> 211 lbs - Patient says he was 233 pounds before coming to the hospital - appreciate GI team recommendations - Continue diuretics spironolactone and Lasix  Transaminitis with hyperbilirubinemia - alcohol induced hepatitis, LFT's overall unchanged - Bilirubin remains elevated - Jaundiced skin, continue to monitor LFTs   Acute and intermittent encephalopathy - hepatic encephalopathy - ammonia level still ~50 - Per patient  diarrhea resolving, will add low-dose lactulose - Continue rifaximin - Repeat ammonia level in the morning  Metastatic prostate cancer  - notified oncologist of patient's admission  - holding Xtandia   Hyperlipidemia - Holding statin    Diarrhea - C. Diff still pending   Thrombocytopenia - from alcohol induced bone marrow damage - Platelets improving  - no signs of bleeding - repeat CBC in AM   Severe PCM - in the setting of metastatic prostate cancer and alcohol use, poor oral intake - nutritionist consulted  - continue to provide ensure    Coagulopathy - With INR 1.7 in the setting of alcoholic liver disease - no signs of bleeding   Hyponatremia - Secondary to dehydration in the setting of alcohol intoxication - Na is trending up from 127 --> 130 --> 131 --> 132 - repeat BMP in AM   Anemia of chronic disease, malignancy and alcohol use - no sings of bleeding - repeat CBC in AM  Alcohol intoxication in patient with known chronic alcohol abuse - Level greater than 136 on admission and undetectable this AM  - monitor onCIWA protocol, continue thiamine, MVI, folate  - Monitor closely for signs of withdrawal  Hypertension, Essential - Continue spironolactone and Lasix  DVT prophylaxis - SCDs  Code Status: Full.  Family Communication:  plan of care discussed with the patient, wife ad children at bedside Disposition Plan: Home when stable. Wife asking about inpatient rehab, consult palced  IV access:  Peripheral IV  Procedures and diagnostic studies:    Ct Abdomen Pelvis W Contrast   11/11/2014   Hepatosplenomegaly with geographic fatty sparing or inflammation in the liver, potentially a result of cirrhosis or hepatitis.  The degree of narrowing of the hepatic veins and intrahepatic IVC is worsened suggesting acute swelling of the liver. No vascular thrombosis observed. 2. Ascites. 3. Probably reactive adenopathy in the gastrohepatic ligament and porta hepatis.  Mesenteric edema noted. 4. There is some mild irregularity of the lumen in the cecum. Cecal mass not readily excluded. In the nonacute setting consider colonoscopy if not recently performed. 5. Scattered sclerotic lesions from prior metastatic disease. Compared to the 2013 CT scan, I do see a new small sclerotic metastatic lesion anteriorly in the right L2 vertebra. 6. Atherosclerosis, including the coronary arteries. 7. Scattered atelectasis in the lung bases.    US Paracentesis   11/19/2014  Successful ultrasound-guided paracentesis yielding 3 liters of peritoneal fluid.   Medical Consultants:  GI IR  Other Consultants:  None  IAnti-Infectives:   None   Allen Ramsay, MD  [facility] Pager [phone]  If 7PM-7AM, please contact night-coverage [email or web address] Password [email or web address] 11/21/2014, 11:59 AM   LOS: 3 days   HPI/Subjective: Patient reports he feels better, abdomen still distended. Persistent diarrhea but overall better.   Objective: Filed Vitals:   11/20/14 1334 11/20/14 1700 11/20/14 2125 11/21/14 0516  BP: 122/83 119/88 117/73 110/71  Pulse: 78 88 79 80  Temp: 98.1 F (36.7 C)  97.9 F (36.6 C) 98 F (36.7 C)  TempSrc: Oral  Oral Oral  Resp: 20  20 20   Height:      Weight:    95.981 kg (211 lb 9.6 oz)  SpO2: 100% 99% 100% 98%    Intake/Output Summary (Last 24 hours) at 11/21/14 1159 Last data filed at 11/20/14 2117  Gross per 24 hour  Intake      3 ml  Output      0 ml  Net      3 ml    Exam:   General:  Pt is alert, follows commands appropriately, not in acute distress, jaundiced skin   Cardiovascular: Regular rate and rhythm, S1/S2, no murmurs, no rubs, no gallops  Respiratory: Clear to auscultation bilaterally, no wheezing, diminished breath sounds at bases   Abdomen: Soft, non tender, still distended, bowel sounds present, no guarding  Extremities: +1 bilateral LE pitting edema, pulses DP and PT palpable bilaterally  Data Reviewed: Basic Metabolic  Panel:  Recent Labs Lab 11/18/14 1407 11/19/14 0525 11/20/14 0550 11/21/14 0542  NA 130* 131* 131* 132*  K 3.8 4.2 4.0 4.5  CL 98 100 101 100  CO2 21 25 24 26   GLUCOSE 94 109* 90 92  BUN 7 8 13 18   CREATININE 0.64 0.73 0.69 0.75  CALCIUM 7.2* 7.9* 7.7* 8.0*  MG 2.0  --   --   --    Liver Function Tests:  Recent Labs Lab 11/18/14 1407 11/19/14 0525 11/20/14 0550 11/21/14 0542  AST 160* 164* 142* 150*  ALT 35 36 32 36  ALKPHOS 205* 187* 155* 169*  BILITOT 8.5* 11.4* 11.7* 12.0*  PROT 7.5 7.5 7.1 7.6  ALBUMIN 2.4* 2.4* 2.2* 2.4*    Recent Labs Lab 11/18/14 1810 11/19/14 1039 11/20/14 0550 11/21/14 0542  AMMONIA 39* 90* 50* 51*   CBC:  Recent Labs Lab 11/18/14 1912 11/19/14 0525 11/19/14 1039 11/20/14 0550 11/21/14 0542  WBC 9.1 9.6 8.3 9.1 10.5  HGB 11.0* 11.0* 10.8* 10.2* 10.9*  HCT 31.8* 32.2* 32.2* 30.2* 32.7*  MCV 117.3* 119.7* 119.3* 120.8* 121.6*  PLT 113* 101* 110* 98* 108*    Scheduled Meds: . feeding supplement (ENSURE ENLIVE)  237 mL Oral BID BM  . folic acid  1 mg Oral Daily  . furosemide  20 mg Intravenous Daily  . metoprolol succinate  12.5 mg Oral Daily  . multivitamin with minerals  1 tablet Oral Daily  . potassium chloride SA  20 mEq Oral BID  . rifaximin  550 mg Oral BID  . sodium chloride  3 mL Intravenous Q12H  . spironolactone  50 mg Oral Daily  . thiamine  100 mg Oral Daily   Or  . thiamine  100 mg Intravenous Daily   Continuous Infusions:

## 2014-11-21 NOTE — Progress Notes (Signed)
Pt placed on c-diff protocol this morning due to patient stating he had 7 loose stools yesterday. Collected stool which was completely formed; therefore, took patient off c-diff precautions.

## 2014-11-22 ENCOUNTER — Inpatient Hospital Stay (HOSPITAL_COMMUNITY): Payer: Medicare Other

## 2014-11-22 LAB — COMPREHENSIVE METABOLIC PANEL
ALK PHOS: 161 U/L — AB (ref 39–117)
ALT: 39 U/L (ref 0–53)
AST: 148 U/L — AB (ref 0–37)
Albumin: 2.3 g/dL — ABNORMAL LOW (ref 3.5–5.2)
Anion gap: 7 (ref 5–15)
BUN: 19 mg/dL (ref 6–23)
CALCIUM: 7.5 mg/dL — AB (ref 8.4–10.5)
CO2: 23 mmol/L (ref 19–32)
CREATININE: 0.86 mg/dL (ref 0.50–1.35)
Chloride: 101 mmol/L (ref 96–112)
GFR calc non Af Amer: 88 mL/min — ABNORMAL LOW (ref >90)
GLUCOSE: 101 mg/dL — AB (ref 70–99)
Potassium: 4.1 mmol/L (ref 3.5–5.1)
Sodium: 131 mmol/L — ABNORMAL LOW (ref 135–145)
TOTAL PROTEIN: 7.2 g/dL (ref 6.0–8.3)
Total Bilirubin: 11.6 mg/dL — ABNORMAL HIGH (ref 0.3–1.2)

## 2014-11-22 LAB — PROTIME-INR
INR: 1.59 — ABNORMAL HIGH (ref 0.00–1.49)
Prothrombin Time: 19.1 seconds — ABNORMAL HIGH (ref 11.6–15.2)

## 2014-11-22 LAB — BODY FLUID CULTURE
Culture: NO GROWTH
Gram Stain: NONE SEEN

## 2014-11-22 LAB — AMMONIA: Ammonia: 70 umol/L — ABNORMAL HIGH (ref 11–32)

## 2014-11-22 MED ORDER — LORAZEPAM 2 MG/ML IJ SOLN: 0.5000 mg | INTRAMUSCULAR | Status: DC | PRN

## 2014-11-22 MED ORDER — FUROSEMIDE 10 MG/ML IJ SOLN: 10.0000 mg | Freq: Every day | INTRAMUSCULAR | Status: DC

## 2014-11-22 MED ORDER — SOTALOL HCL 80 MG PO TABS
80.0000 mg | ORAL_TABLET | Freq: Two times a day (BID) | ORAL | Status: DC
  Administered 2014-11-22 – 2014-11-24 (×4): 80 mg via ORAL
  Filled 2014-11-22 (×5): qty 1

## 2014-11-22 MED ORDER — CLOPIDOGREL BISULFATE 75 MG PO TABS
75.0000 mg | ORAL_TABLET | Freq: Every day | ORAL | Status: DC
  Administered 2014-11-22 – 2014-11-24 (×3): 75 mg via ORAL
  Filled 2014-11-22 (×3): qty 1

## 2014-11-22 MED ORDER — LORAZEPAM 0.5 MG PO TABS: 0.5000 mg | ORAL_TABLET | ORAL | Status: DC | PRN

## 2014-11-22 MED ORDER — LORAZEPAM 2 MG/ML IJ SOLN
1.0000 mg | INTRAMUSCULAR | Status: DC | PRN
  Administered 2014-11-23: 1 mg via INTRAVENOUS
  Filled 2014-11-22: qty 1

## 2014-11-22 MED ORDER — RIFAXIMIN 550 MG PO TABS
550.0000 mg | ORAL_TABLET | Freq: Two times a day (BID) | ORAL | Status: DC
  Administered 2014-11-22 – 2014-11-24 (×4): 550 mg via ORAL
  Filled 2014-11-22 (×5): qty 1

## 2014-11-22 MED ORDER — SPIRONOLACTONE 25 MG PO TABS
25.0000 mg | ORAL_TABLET | Freq: Every day | ORAL | Status: DC
  Administered 2014-11-23 – 2014-11-24 (×2): 25 mg via ORAL
  Filled 2014-11-22 (×2): qty 1

## 2014-11-22 MED ORDER — LORAZEPAM 1 MG PO TABS: 1.0000 mg | ORAL_TABLET | ORAL | Status: DC | PRN

## 2014-11-22 MED ORDER — FUROSEMIDE 20 MG PO TABS
10.0000 mg | ORAL_TABLET | Freq: Every day | ORAL | Status: DC
  Administered 2014-11-23 – 2014-11-24 (×2): 10 mg via ORAL
  Filled 2014-11-22 (×2): qty 0.5

## 2014-11-22 MED ORDER — TEMAZEPAM 7.5 MG PO CAPS
7.5000 mg | ORAL_CAPSULE | Freq: Every evening | ORAL | Status: DC | PRN
  Administered 2014-11-22: 7.5 mg via ORAL
  Filled 2014-11-22: qty 1

## 2014-11-22 NOTE — Evaluation (Signed)
Physical Therapy Evaluation Patient Details Name: Allen Schroeder MRN: [id number] DOB: 26-Feb-1951 Today's Date: 11/22/2014   History of Present Illness  pt admit with increased swelling in LEs and abdomen and weakness. Found to have alcohol induced hepatitis and ascites (3 L pulled 4/14) , acute on chronic encephalopathy, metastatic prostate cancer.   Clinical Impression  Pt with decreased ability with all mobility due to balance deficits and weakness, to benefit from continued PT to increased ability and safety with all mobility.     Follow Up Recommendations CIR;SNF (noted family and MD discussing CIR possibilities)    Equipment Recommendations  Rolling walker with 5" wheels    Recommendations for Other Services       Precautions / Restrictions Precautions Precautions: Fall Restrictions Weight Bearing Restrictions: No      Mobility  Bed Mobility Overal bed mobility: Needs Assistance Bed Mobility: Supine to Sit;Sit to Supine     Supine to sit: Mod assist;HOB elevated     General bed mobility comments: need assistnce with upper boday and little for scooting as well  Transfers Overall transfer level: Needs assistance Equipment used: Rolling walker (2 wheeled) Transfers: Sit to/from Stand Sit to Stand: Mod assist         General transfer comment: at bed needed a little lift to rise and at toilet required grab bar and littel assistance to rise and to steady once standing due to posterior inital lean  Ambulation/Gait Ambulation/Gait assistance: +2 safety/equipment;Min assist Ambulation Distance (Feet): 10 Feet (to bathroom and back (so 2 sets fo 10 ft) tired after toileting due to length of time on toilet, so unable to walk further during this session) Assistive device: Rolling walker (2 wheeled) Gait Pattern/deviations: Step-through pattern Gait velocity: slow   General Gait Details: difficult to assess entirely due to short distance and focusing on lines, etc.  however required 2 people for safety due to one with lines and other one assisting him. With some retropulsion and cued to shift weaight forward more onto toes instead of heels and pt responded to cueing and twas able to shift weight forward a little.   Stairs            Wheelchair Mobility    Modified Rankin (Stroke Patients Only)       Balance Overall balance assessment: Needs assistance Sitting-balance support: Bilateral upper extremity supported;Feet supported Sitting balance-Leahy Scale: Fair Sitting balance - Comments: with movment of LEs pt with posterior lean and responded to VC to correct it. Could not practice much on sitting balance due to pt had to go to the bathroom.  Postural control: Posterior lean Standing balance support: Bilateral upper extremity supported Standing balance-Leahy Scale: Fair Standing balance comment: stadning to pull under garmetns up , pt could not let go of grab bar to assist. Tried but aware balance was off, therefore assist needed with static standing to pull garments up at toilet; also had same baalance issues at sink with posterior lean, responding to cues though (VC and tactile cures)                             Pertinent Vitals/Pain Pain Assessment: No/denies pain (in general in his back and joints )    Home Living Family/patient expects to be discharged to:: Private residence Living Arrangements: Spouse/significant other Available Help at Discharge: Family Type of Home: House Home Access: Stairs to enter   [employer] of Steps: 3-4 Home Layout:  Two level;Bed/bath upstairs Home Equipment: None (family discussing need to get bathroom adn home equipped with grab bars and shower bench, etc)      Prior Function Level of Independence: Independent               Hand Dominance        Extremity/Trunk Assessment               Lower Extremity Assessment: Generalized weakness (noted 4-/5 for LLE and  3+/5 for RLE in sitting position when tested. and noted some "numbness" feeling in B LEs from his cancer medications)         Communication   Communication: No difficulties (little slow to process and slow to speak)  Cognition Arousal/Alertness: Awake/alert Behavior During Therapy: WFL for tasks assessed/performed Overall Cognitive Status: Within Functional Limits for tasks assessed                      General Comments      Exercises        Assessment/Plan    PT Assessment Patient needs continued PT services  PT Diagnosis Difficulty walking;Generalized weakness   PT Problem List Decreased strength;Decreased activity tolerance;Decreased balance;Decreased mobility;Decreased knowledge of use of DME;Decreased safety awareness  PT Treatment Interventions DME instruction;Gait training;Stair training;Functional mobility training;Therapeutic activities;Therapeutic exercise;Patient/family education;Neuromuscular re-education   PT Goals (Current goals can be found in the Care Plan section) Acute Rehab PT Goals Patient Stated Goal: to get better PT Goal Formulation: With patient Time For Goal Achievement: 12/06/14 Potential to Achieve Goals: Good    Frequency Min 3X/week   Barriers to discharge        Co-evaluation               End of Session Equipment Utilized During Treatment: Gait belt Activity Tolerance: Patient tolerated treatment well Patient left: in chair;with family/visitor present Nurse Communication: Mobility status         Time: 4401-0272 PT Time Calculation (min) (ACUTE ONLY): 50 min   Charges:   PT Evaluation $Initial PT Evaluation Tier I: 1 Procedure PT Treatments $Therapeutic Activity: 8-22 mins   PT G CodesClide Dales 12/08/14, 5:39 PM  Clide Dales, PT Pager: [phone] 12-08-2014

## 2014-11-22 NOTE — Progress Notes (Addendum)
Patient ID: Allen Schroeder, male   DOB: Oct 04, 1950, 64 y.o.   MRN: [id number]  [facility] PROGRESS NOTE  Allen Schroeder ION:[id number] DOB: 1950-08-16 DOA: 11/18/2014 PCP: Silvano Rusk, MD   Brief narrative:    64 year old male with known metastatic prostate cancer, under the care of Dr. [name], long history of alcohol abuse, still currently drinking, presented to Dr. Celesta Aver office earlier today prior to this admission with main concern of progressively worsening swelling in the feet and ankles, abdomen, fatigue and poor oral intake. In addition patient reported diffuse body pains over the past several weeks. Patient denies any specific symptom like chest pain or shortness of breath, denies fevers or chills. In the office, patient notably jaundiced with swelling in feet and ankles, lower extremities, abdomen distended. Tried hospitalist asked to admit for further evaluation and management. Please note that patient is currently rather tired and fairly difficult to obtain detailed history from. Per earlier report review, wife reported that patient has been through alcohol rehabilitation but that has not work as patient is still actively drinking and wife noted some episodes of confusion at home.  Assessment/Plan:    Active Problems:  Alcoholic hepatitis with ascites - appreciate IR assistance, pt is now status post paracentesis with 3 L fluid removed and sent for analysis 4/14 - Abdomen looks less distended on exam this morning, weight is trending down from 213 lbs --> 211 lbs - Patient says he was 233 pounds before coming to the hospital - appreciate GI team recommendations - Continue diuretics spironolactone and Lasix  Transaminitis with hyperbilirubinemia - alcohol induced hepatitis, LFT's overall unchanged, slightly better  - Bilirubin also slightly better  - Jaundiced skin, continue to monitor LFTs   Acute and intermittent encephalopathy - hepatic encephalopathy, ammonia level up  over the past 24 hours - pt is slightly more somnolent this AM and unclear if ativan related to hepatic encephalopathy  - Continue rifaximin and lactulose  - Repeat ammonia level in the morning - attempt PT evaluation once pt more alert and able to participate   Metastatic prostate cancer  - notified oncologist of patient's admission  - holding Xtandia   Hyperlipidemia - Holding statin    Diarrhea - C. Diff still pending, ask nursing staff on delay in collection    Thrombocytopenia - from alcohol induced bone marrow damage - Platelets improving  - no signs of bleeding - repeat CBC in AM   Severe PCM - in the setting of metastatic prostate cancer and alcohol use, poor oral intake - nutritionist consulted  - continue to provide ensure    Coagulopathy - With INR 1.59 in the setting of alcoholic liver disease - no signs of bleeding   Hyponatremia - Secondary to dehydration in the setting of alcohol intoxication - Na is trending up from 127 --> 130 --> 131 --> 132 --> 131 - repeat BMP in AM   Anemia of chronic disease, malignancy and alcohol use - no sings of bleeding - repeat CBC in AM  Alcohol intoxication in patient with known chronic alcohol abuse - Level greater than 136 on admission and undetectable this AM  - monitor on CIWA protocol, continue thiamine, MVI, folate  - Monitor closely for signs of withdrawal  Hypertension, Essential - Continue spironolactone and Lasix - resumed sotalol at the lower dose 80 mg QD due to still lower BP   DVT prophylaxis - SCDs  Code Status: Full.  Family Communication:  plan of care discussed with the  patient, wife ad children at bedside, spent over 45 minutes discussing diagnostic tests and results, progression of clinical condition and plan of care, all questions answered Disposition Plan: Home when stable. IR consult palced  IV access:  Peripheral IV  Procedures and diagnostic studies:    Ct Abdomen Pelvis W Contrast    11/11/2014   Hepatosplenomegaly with geographic fatty sparing or inflammation in the liver, potentially a result of cirrhosis or hepatitis. The degree of narrowing of the hepatic veins and intrahepatic IVC is worsened suggesting acute swelling of the liver. No vascular thrombosis observed. 2. Ascites. 3. Probably reactive adenopathy in the gastrohepatic ligament and porta hepatis. Mesenteric edema noted. 4. There is some mild irregularity of the lumen in the cecum. Cecal mass not readily excluded. In the nonacute setting consider colonoscopy if not recently performed. 5. Scattered sclerotic lesions from prior metastatic disease. Compared to the 2013 CT scan, I do see a new small sclerotic metastatic lesion anteriorly in the right L2 vertebra. 6. Atherosclerosis, including the coronary arteries. 7. Scattered atelectasis in the lung bases.    US Paracentesis   11/19/2014  Successful ultrasound-guided paracentesis yielding 3 liters of peritoneal fluid.   Medical Consultants:  GI IR  Other Consultants:  None  IAnti-Infectives:   None   Faye Ramsay, MD  [facility] Pager [phone]  If 7PM-7AM, please contact night-coverage [email or web address] Password [email or web address] 11/22/2014, 3:07 PM   LOS: 4 days   HPI/Subjective: Persistent diarrhea but overall better.   Objective: Filed Vitals:   11/21/14 2225 11/22/14 0637 11/22/14 0900 11/22/14 1050  BP: 98/51 111/71 120/72   Pulse: 86 79 78   Temp: 97.6 F (36.4 C) 97.7 F (36.5 C)    TempSrc: Oral Oral    Resp: 18 18    Height:      Weight:  99.066 kg (218 lb 6.4 oz)  96.5 kg (212 lb 11.9 oz)  SpO2: 96% 100%      Intake/Output Summary (Last 24 hours) at 11/22/14 1507 Last data filed at 11/22/14 0900  Gross per 24 hour  Intake      3 ml  Output      0 ml  Net      3 ml    Exam:   General:  Pt is alert, follows commands appropriately, not in acute distress, jaundiced skin   Cardiovascular: Regular rate and rhythm, S1/S2, no murmurs, no rubs, no  gallops  Respiratory: Clear to auscultation bilaterally, no wheezing, diminished breath sounds at bases   Abdomen: Soft, non tender, still distended, bowel sounds present, no guarding  Extremities: trace bilateral LE pitting edema, pulses DP and PT palpable bilaterally  Data Reviewed: Basic Metabolic Panel:  Recent Labs Lab 11/18/14 1407 11/19/14 0525 11/20/14 0550 11/21/14 0542 11/22/14 0527  NA 130* 131* 131* 132* 131*  K 3.8 4.2 4.0 4.5 4.1  CL 98 100 101 100 101  CO2 21 25 24 26 23   GLUCOSE 94 109* 90 92 101*  BUN 7 8 13 18 19   CREATININE 0.64 0.73 0.69 0.75 0.86  CALCIUM 7.2* 7.9* 7.7* 8.0* 7.5*  MG 2.0  --   --   --   --    Liver Function Tests:  Recent Labs Lab 11/18/14 1407 11/19/14 0525 11/20/14 0550 11/21/14 0542 11/22/14 0527  AST 160* 164* 142* 150* 148*  ALT 35 36 32 36 39  ALKPHOS 205* 187* 155* 169* 161*  BILITOT 8.5* 11.4* 11.7* 12.0* 11.6*  PROT 7.5 7.5 7.1 7.6 7.2  ALBUMIN 2.4* 2.4* 2.2* 2.4* 2.3*    Recent Labs Lab 11/18/14 1810 11/19/14 1039 11/20/14 0550 11/21/14 0542 11/22/14 0527  AMMONIA 39* 90* 50* 51* 70*   CBC:  Recent Labs Lab 11/18/14 1912 11/19/14 0525 11/19/14 1039 11/20/14 0550 11/21/14 0542  WBC 9.1 9.6 8.3 9.1 10.5  HGB 11.0* 11.0* 10.8* 10.2* 10.9*  HCT 31.8* 32.2* 32.2* 30.2* 32.7*  MCV 117.3* 119.7* 119.3* 120.8* 121.6*  PLT 113* 101* 110* 98* 108*    Scheduled Meds: . feeding supplement (ENSURE ENLIVE)  237 mL Oral BID BM  . folic acid  1 mg Oral Daily  . [START ON 11/23/2014] furosemide  10 mg Intravenous Daily  . lactulose  10 g Oral BID  . metoprolol succinate  12.5 mg Oral Daily  . multivitamin with minerals  1 tablet Oral Daily  . potassium chloride SA  20 mEq Oral BID  . rifaximin  550 mg Oral BID  . sodium chloride  3 mL Intravenous Q12H  . [START ON 11/23/2014] spironolactone  25 mg Oral Daily  . thiamine  100 mg Oral Daily   Or  . thiamine  100 mg Intravenous Daily   Continuous Infusions:

## 2014-11-23 ENCOUNTER — Inpatient Hospital Stay (HOSPITAL_COMMUNITY): Payer: Medicare Other

## 2014-11-23 ENCOUNTER — Encounter (HOSPITAL_COMMUNITY): Payer: Self-pay

## 2014-11-23 DIAGNOSIS — F411 Generalized anxiety disorder: Secondary | ICD-10-CM | POA: Insufficient documentation

## 2014-11-23 DIAGNOSIS — K766 Portal hypertension: Secondary | ICD-10-CM | POA: Insufficient documentation

## 2014-11-23 DIAGNOSIS — R17 Unspecified jaundice: Secondary | ICD-10-CM

## 2014-11-23 DIAGNOSIS — D696 Thrombocytopenia, unspecified: Secondary | ICD-10-CM | POA: Insufficient documentation

## 2014-11-23 DIAGNOSIS — F1010 Alcohol abuse, uncomplicated: Secondary | ICD-10-CM

## 2014-11-23 LAB — COMPREHENSIVE METABOLIC PANEL
ALBUMIN: 2.2 g/dL — AB (ref 3.5–5.2)
ALK PHOS: 150 U/L — AB (ref 39–117)
ALT: 44 U/L (ref 0–53)
ANION GAP: 6 (ref 5–15)
AST: 149 U/L — ABNORMAL HIGH (ref 0–37)
BUN: 18 mg/dL (ref 6–23)
CO2: 23 mmol/L (ref 19–32)
Calcium: 7.4 mg/dL — ABNORMAL LOW (ref 8.4–10.5)
Chloride: 101 mmol/L (ref 96–112)
Creatinine, Ser: 0.84 mg/dL (ref 0.50–1.35)
GFR calc Af Amer: >90 mL/min (ref >90)
GFR calc non Af Amer: 89 mL/min — ABNORMAL LOW (ref >90)
GLUCOSE: 89 mg/dL (ref 70–99)
POTASSIUM: 4.3 mmol/L (ref 3.5–5.1)
Sodium: 130 mmol/L — ABNORMAL LOW (ref 135–145)
TOTAL PROTEIN: 7.2 g/dL (ref 6.0–8.3)
Total Bilirubin: 12.1 mg/dL — ABNORMAL HIGH (ref 0.3–1.2)

## 2014-11-23 LAB — PROTIME-INR
INR: 1.51 — ABNORMAL HIGH (ref 0.00–1.49)
Prothrombin Time: 18.3 seconds — ABNORMAL HIGH (ref 11.6–15.2)

## 2014-11-23 LAB — CBC
HCT: 30.7 % — ABNORMAL LOW (ref 39.0–52.0)
Hemoglobin: 10.4 g/dL — ABNORMAL LOW (ref 13.0–17.0)
MCH: 41.4 pg — AB (ref 26.0–34.0)
MCHC: 33.9 g/dL (ref 30.0–36.0)
MCV: 122.3 fL — AB (ref 78.0–100.0)
PLATELETS: 121 10*3/uL — AB (ref 150–400)
RBC: 2.51 MIL/uL — ABNORMAL LOW (ref 4.22–5.81)
RDW: 20.7 % — AB (ref 11.5–15.5)
WBC: 10.4 10*3/uL (ref 4.0–10.5)

## 2014-11-23 LAB — AMMONIA: Ammonia: 67 umol/L — ABNORMAL HIGH (ref 11–32)

## 2014-11-23 MED ORDER — ALBUMIN HUMAN 25 % IV SOLN
75.0000 g | Freq: Once | INTRAVENOUS | Status: DC
  Filled 2014-11-23: qty 300

## 2014-11-23 MED ORDER — CHLORDIAZEPOXIDE HCL 25 MG PO CAPS
25.0000 mg | ORAL_CAPSULE | Freq: Three times a day (TID) | ORAL | Status: DC
  Administered 2014-11-23 – 2014-11-24 (×4): 25 mg via ORAL
  Filled 2014-11-23 (×4): qty 1

## 2014-11-23 NOTE — Consult Note (Signed)
Physical Medicine and Rehabilitation Consult Reason for Consult: Chronic encephalopathy/alcoholic hepatitis with ascites Referring Physician: [facility]   HPI: Allen Schroeder is a 64 y.o. right handed male with history of atrial fibrillation, metastatic prostate cancer followed by Dr. [name], long history of alcohol abuse. By report patient lives with his wife was independent prior to admission. Admitted 11/18/2014 with abdominal swelling as well as lower extremity edema and increasing fatigue with poor oral intake. Wife had noted episodes of confusion at home. Alcohol level on admission of 136. CT of the abdomen showed ascites as well as hepatosplenomegaly. Underwent ultrasound-guided paracentesis per interventional radiology with a total of approximately 3 L of serous fluid removed. Ammonia levels of 90 at admission and maintained on Chronulac. Monitor closely for alcohol draw all. Hyponatremia 127-131. Oncology services is aware of patient's admission currently Xtandia on hold for metastatic prostate cancer. C. difficile precautions with specimen pending. Physical therapy evaluation completed 11/22/2014 with recommendations of physical medicine rehabilitation consult   Review of Systems  Cardiovascular: Positive for palpitations.  Gastrointestinal: Positive for abdominal pain.  Musculoskeletal: Positive for joint pain.  Psychiatric/Behavioral:       Anxiety   Past Medical History  Diagnosis Date  . Atrial fibrillation   . Other and unspecified hyperlipidemia   . Unspecified essential hypertension   . Malignant neoplasm of prostate 11/2007    Mets to Bone  . Elevated prostate specific antigen (PSA)   . Personal history of colonic polyps   . Hypocalcemia   . Acute alcoholic hepatitis   . Essential and other specified forms of tremor   . Anxiety state, unspecified   . Family history of malignant neoplasm of gastrointestinal tract   . Alcohol abuse with physiological dependence  02/13/2012  . 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 Alcoholic hepatitis with ascites 11/13/2014   Past Surgical History  Procedure Laterality Date  . Vasectomy    . Cholecystectomy    . Coronary stent placement  2011  . Colonoscopy w/ biopsies     Family History  Problem Relation Age of Onset  . Prostate cancer Father   . Coronary artery disease Father   . Colon cancer Father   . Aneurysm Mother    Social History:  reports that he has quit smoking. His smoking use included Cigarettes. He has never used smokeless tobacco. He reports that he drinks alcohol. He reports that he does not use illicit drugs. Allergies: No Known Allergies Medications Prior to Admission  Medication Sig Dispense Refill  . CALCIUM CARB-CHOLECALCIFEROL PO Take 1 capsule by mouth daily.     . clopidogrel (PLAVIX) 75 MG tablet TAKE ONE TABLET BY MOUTH ONCE DAILY (Patient taking differently: Take 75 mg by mouth once. ) 30 tablet 11  . diphenhydrAMINE (BENADRYL) 25 MG tablet Take 25 mg by mouth every 6 (six) hours as needed for allergies or sleep.     . folic acid (FOLVITE) 1 MG tablet Take one tablet by mouth once a day.... (Patient taking differently: Take 1 mg by mouth daily. ) 30 tablet 6  . metoprolol succinate (TOPROL-XL) 25 MG 24 hr tablet TAKE ONE TABLET BY MOUTH ONCE DAILY (Patient taking differently: Take 25 mg by mouth daily. ) 30 tablet 11  . Multiple Vitamin (MULTIVITAMIN) tablet Take 1 tablet by mouth daily.      . naproxen sodium (ANAPROX) 220 MG tablet Take  440 mg by mouth every 6 (six) hours as needed (pain).    . potassium chloride SA (K-DUR,KLOR-CON) 20 MEQ tablet Take 1 tablet (20 mEq total) by mouth 2 (two) times daily. 30 tablet 1  . rosuvastatin (CRESTOR) 20 MG tablet TAKE ONE TABLET BY MOUTH ONCE DAILY (Patient taking differently: Take 20 mg by mouth daily. ) 30 tablet 11  . sotalol (BETAPACE) 120 MG tablet  TAKE ONE TABLET TWICE DAILY (Patient taking differently: Take 120 mg by mouth 2 (two) times daily. ) 60 tablet 11  . UNKNOWN TO PATIENT Slow release hormone implant placed 8/13 by Dr. Willette Cluster.  Good for one year    . XTANDI 40 MG capsule TAKE 4 CAPSULES BY MOUTH DAILY 120 capsule 1  . nitroGLYCERIN (NITROSTAT) 0.4 MG SL tablet Place 1 tablet (0.4 mg total) under the tongue every 5 (five) minutes as needed. (Patient not taking: Reported on 11/18/2014) 25 tablet 3    Home: Home Living Family/patient expects to be discharged to:: Private residence Living Arrangements: Spouse/significant other Available Help at Discharge: Family Type of Home: House Home Access: Stairs to enter [employer] of Steps: 3-4 Home Layout: Two level, Bed/bath upstairs Alternate Level Stairs-Number of Steps: flight (unsure if able to live on main level) Home Equipment: None (family discussing need to get bathroom adn home equipped with grab bars and shower bench, etc)  Functional History: Prior Function Level of Independence: Independent Functional Status:  Mobility: Bed Mobility Overal bed mobility: Needs Assistance Bed Mobility: Supine to Sit, Sit to Supine Supine to sit: Mod assist, HOB elevated General bed mobility comments: need assistnce with upper boday and little for scooting as well Transfers Overall transfer level: Needs assistance Equipment used: Rolling walker (2 wheeled) Transfers: Sit to/from Stand Sit to Stand: Mod assist General transfer comment: at bed needed a little lift to rise and at toilet required grab bar and littel assistance to rise and to steady once standing due to posterior inital lean Ambulation/Gait Ambulation/Gait assistance: +2 safety/equipment, Min assist Ambulation Distance (Feet): 10 Feet (to bathroom and back (so 2 sets fo 10 ft) tired after toileting due to length of time on toilet, so unable to walk further during this session) Assistive device: Rolling  walker (2 wheeled) Gait Pattern/deviations: Step-through pattern Gait velocity: slow General Gait Details: difficult to assess entirely due to short distance and focusing on lines, etc. however required 2 people for safety due to one with lines and other one assisting him. With some retropulsion and cued to shift weaight forward more onto toes instead of heels and pt responded to cueing and twas able to shift weight forward a little.     ADL:    Cognition: Cognition Overall Cognitive Status: Within Functional Limits for tasks assessed Cognition Arousal/Alertness: Awake/alert Behavior During Therapy: WFL for tasks assessed/performed Overall Cognitive Status: Within Functional Limits for tasks assessed  Blood pressure 113/72, pulse 77, temperature 98.5 F (36.9 C), temperature source Oral, resp. rate 18, height 6\' 2"  (1.88 m), weight 96.5 kg (212 lb 11.9 oz), SpO2 96 %. Physical Exam  Constitutional: He is oriented to person, place, and time.  HENT:  Head: Normocephalic.  Eyes: EOM are normal.  Neck: Normal range of motion. Neck supple. No thyromegaly present.  Cardiovascular: Normal rate and regular rhythm.   Respiratory: Effort normal and breath sounds normal. No respiratory distress.  GI: Soft. Bowel sounds are normal. There is no tenderness.  distended  Neurological: He is alert and oriented to person,  place, and time.  Slow to respond but appropriate    No results found for this or any previous visit (from the past 24 hour(s)). Dg Chest 1 View  11/22/2014   CLINICAL DATA:  Dyspnea. History of atrial fibrillation and prostate carcinoma metastatic to the bones. Ex-smoker.  EXAM: CHEST  1 VIEW  COMPARISON:  02/19/2012  FINDINGS: Cardiac silhouette is normal in size. No mediastinal or hilar masses or evidence of adenopathy.  Clear lungs.  No pleural effusion or pneumothorax.  Old left-sided rib fracture.  No visible osteoblastic lesions  IMPRESSION: No acute cardiopulmonary disease.    Electronically Signed   By: Lajean Manes M.D.   On: 11/22/2014 11:12    Assessment/Plan: Diagnosis: debility and encephalopathy related to etoh hepatitis 1. Does the need for close, 24 hr/day medical supervision in concert with the patient's rehab needs make it unreasonable for this patient to be served in a less intensive setting? Yes 2. Co-Morbidities requiring supervision/potential complications: afib, htn 3. Due to bladder management, bowel management, safety, skin/wound care, disease management, medication administration, pain management and patient education, does the patient require 24 hr/day rehab nursing? Yes 4. Does the patient require coordinated care of a physician, rehab nurse, PT (1-2 hrs/day, 5 days/week), OT (1-2 hrs/day, 5 days/week) and SLP (1-2 hrs/day, 5 days/week) to address physical and functional deficits in the context of the above medical diagnosis(es)? Yes Addressing deficits in the following areas: balance, endurance, locomotion, strength, transferring, bowel/bladder control, bathing, dressing, feeding, grooming, toileting and psychosocial support 5. Can the patient actively participate in an intensive therapy program of at least 3 hrs of therapy per day at least 5 days per week? Yes 6. The potential for patient to make measurable gains while on inpatient rehab is excellent 7. Anticipated functional outcomes upon discharge from inpatient rehab are modified independent and supervision  with PT, modified independent and supervision with OT, modified independent and supervision with SLP. 8. Estimated rehab length of stay to reach the above functional goals is: 7-9 days 9. Does the patient have adequate social supports and living environment to accommodate these discharge functional goals? Yes 10. Anticipated D/C setting: Home 11. Anticipated post D/C treatments: HH therapy and Outpatient therapy 12. Overall Rehab/Functional Prognosis: excellent  RECOMMENDATIONS: This  patient's condition is appropriate for continued rehabilitative care in the following setting: CIR Patient has agreed to participate in recommended program. Yes Note that insurance prior authorization may be required for reimbursement for recommended care.  Comment: Rehab Admissions Coordinator to follow up.  Thanks,  Meredith Staggers, MD, Mellody Drown     11/23/2014

## 2014-11-23 NOTE — Progress Notes (Signed)
Inpatient Rehabilitation  I met with the patient and his wife at the bedside to discuss his post acute rehab options.  Wife Dorian Pod is very interested in pt. coming for IP Rehab and pt. says he will participated.  I have notified them as well as Seth Bake CM and Claiborne Billings SW that it is unlikely that I will have bed availability.  We currently recommend SNF back up plan.  I mentioned the possibility of having CM check into IP Rehab elsewhere however Dorian Pod does not want to consider pt. Being further away.    Wife is agreeable to SNF as a backup.  Her primary concern is that he does not go directly home as she feels death will be relatively imminent if he resume drinking. I will follow up tomorrow .  Please call if questions.  [place] Admissions Coordinator Cell [phone] Office [phone]

## 2014-11-23 NOTE — Progress Notes (Signed)
CSW spoke with patient & wife, Allen Schroeder at bedside re: discharge planning. Awaiting response from CIR re: bed availability tomorrow morning, but as backup to CIR - patient is agreeable to SNF. CSW faxed information out & provided SNF bed offers. CSW will follow-up tomorrow.   Clinical Social Work Department CLINICAL SOCIAL WORK PLACEMENT NOTE 11/23/2014  Patient:  Allen Schroeder, Allen Schroeder  Account Number:  [id number] Admit date:  11/18/2014  Clinical Social Worker:  Renold Genta  Date/time:  11/23/2014 05:57 PM  Clinical Social Work is seeking post-discharge placement for this patient at the following level of care:   SKILLED NURSING   (*CSW will update this form in Epic as items are completed)   11/23/2014  Patient/family provided with [place] Department of Clinical Social Work's list of facilities offering this level of care within the geographic area requested by the patient (or if unable, by the patient's family).  11/23/2014  Patient/family informed of their freedom to choose among providers that offer the needed level of care, that participate in Medicare, Medicaid or managed care program needed by the patient, have an available bed and are willing to accept the patient.  11/23/2014  Patient/family informed of [facility]' ownership interest in [facility], as well as of the fact that they are under no obligation to receive care at this facility.  PASARR submitted to EDS on 11/23/2014 PASARR number received on 11/23/2014  FL2 transmitted to all facilities in geographic area requested by pt/family on  11/23/2014 FL2 transmitted to all facilities within larger geographic area on   Patient informed that his/her managed care company has contracts with or will negotiate with  certain facilities, including the following:     Patient/family informed of bed offers received:  11/23/2014 Patient chooses bed at  Physician recommends and patient chooses bed at    Patient to be  transferred to  on   Patient to be transferred to facility by  Patient and family notified of transfer on  Name of family member notified:    The following physician request were entered in Epic:   Additional Comments:    Allen Schroeder, [name] Social Worker cell #: [phone]

## 2014-11-23 NOTE — Progress Notes (Signed)
Patient presented for therapeutic paracentesis, upon reviewing [place] images trace amount of ascites is seen not amendable to percutaneous drainage. Procedure canceled.   Tsosie Billing PA-C Interventional Radiology  11/23/14  2:07 PM

## 2014-11-23 NOTE — Progress Notes (Signed)
PT Cancellation Note  Patient Details Name: Allen Schroeder MRN: [id number] DOB: [birth year]   Cancelled Treatment:    Reason Eval/Treat Not Completed: Patient declined, no reason specified. Pt stated he'd rather not walk right now. Will follow.    Blondell Reveal Kistler 11/23/2014, 12:24 PM [phone]

## 2014-11-23 NOTE — Progress Notes (Signed)
CARE MANAGEMENT NOTE 11/23/2014  Patient:  CHILTON, SALLADE   Account Number:  [id number]  Date Initiated:  11/23/2014  Documentation initiated by:  Edwyna Shell  Subjective/Objective Assessment:   64 yo male admitted with alcoholic hepatitis with ascies from home with spouse     Action/Plan:   discharge planning   Anticipated DC Date:  11/24/2014   Anticipated DC Plan:  IP REHAB FACILITY  In-house referral  Clinical Social Worker      DC Planning Services  CM consult      Choice offered to / List presented to:             Status of service:  In process, will continue to follow Medicare Important Message given?  YES (If response is "NO", the following Medicare IM given date fields will be blank) Date Medicare IM given:  11/23/2014 Medicare IM given by:  Edwyna Shell Date Additional Medicare IM given:   Additional Medicare IM given by:    Discharge Disposition:  ASSISTED LIVING  Per UR Regulation:    If discussed at Long Length of Stay Meetings, dates discussed:    Comments:  11/23/14 Edwyna Shell RN BSN CM [phone] Spoke with patient and spouse Dorian Pod regarding CIR referral but also discussed second plan pending bed availability at CIR, at time of discharge. Spouse and patient prefer CIR although understand the need for a secondary plan and agreed to a SNF search for rehab. Explained that SCW will follow with bed offers from SNF's and can explain the transition. Spouse Dorian Pod stated that it is not an option for the patient to come home at this time. Will continue to follow with dc plan

## 2014-11-23 NOTE — Progress Notes (Signed)
    Progress Note   Subjective  feels okay today.    Objective   Vital signs in last 24 hours: Temp:  [98.3 F (36.8 C)-98.7 F (37.1 C)] 98.5 F (36.9 C) (04/18 0545) Pulse Rate:  [73-85] 77 (04/18 0545) Resp:  [16-18] 18 (04/18 0545) BP: (92-135)/(54-95) 113/72 mmHg (04/18 0545) SpO2:  [94 %-96 %] 96 % (04/18 0545) Weight:  [212 lb 11.9 oz (96.5 kg)] 212 lb 11.9 oz (96.5 kg) (04/17 1050) Last BM Date: 11/22/14 General:    Pleasant deeply jaundiced white male in NAD reading newspaper.  Heart:  Regular rate and rhythm Abdomen:  Soft, nontender, moderately distended. Normal bowel sounds. Extremities:  Without edema. Neurologic:  Alert and oriented,  grossly normal neurologically. Psych:  Cooperative. Normal mood and affect.  Intake/Output from previous day: 04/17 0701 - 04/18 0700 In: 243 [P.O.:240; I.V.:3] Out: -  Intake/Output this shift:    Lab Results:  Recent Labs  11/21/14 0542 11/23/14 0556  WBC 10.5 10.4  HGB 10.9* 10.4*  HCT 32.7* 30.7*  PLT 108* 121*   BMET  Recent Labs  11/21/14 0542 11/22/14 0527 11/23/14 0556  NA 132* 131* 130*  K 4.5 4.1 4.3  CL 100 101 101  CO2 26 23 23   GLUCOSE 92 101* 89  BUN 18 19 18   CREATININE 0.75 0.86 0.84  CALCIUM 8.0* 7.5* 7.4*   LFT  Recent Labs  11/23/14 0556  PROT 7.2  ALBUMIN 2.2*  AST 149*  ALT 44  ALKPHOS 150*  BILITOT 12.1*   PT/INR  Recent Labs  11/22/14 0527 11/23/14 0556  LABPROT 19.1* 18.3*  INR 1.59* 1.51*      Assessment / Plan:   87. 64 year old male with with ETOH hepatitis. He has new development of portal HTN with hepatosplenomegaly and ascites. While portal HTN can be seen in severe ETOH hepatitis, underlying cirrhosis not excluded. Continue 2gram sodium diet, he is on a small amount of aldactone and lasix, renal function normal. Transaminases improving, bili remaining high around 12 (not uncommon).   2. Metastatic prostate cancer. Ascitic fluid negative for malignancy.    3. CAD, s/p stenting. On chronic plavix.   4  Anxiety. Patient feels he is "beyond"  Inpatient or outpatient rehab. He recalls good response to Librium QID several years ago. Maybe this is [facility] trying. If anxiety not controlled patient obviously at higher risk for ETOH relapse.   LOS: 5 days   Tye Savoy  11/23/2014, 8:53 AM

## 2014-11-23 NOTE — Progress Notes (Signed)
Inpatient Rehabilitation  We received a screen request from PT.  I note that an IP Rehab consult has also been ordered.  Will await input from MD and the admissions coordinator will follow up accordingly.    [place] Admissions Coordinator Cell [phone] Office [phone]

## 2014-11-23 NOTE — Progress Notes (Signed)
Patient ID: Allen Schroeder, male   DOB: [birth year], 64 y.o.   MRN: [id number]  [facility] PROGRESS NOTE  Allen Schroeder JHE:[id number] DOB: 1951-04-14 DOA: 11/18/2014 PCP: Silvano Rusk, MD   Brief narrative:    64 year old male with known metastatic prostate cancer, under the care of Dr. [name], long history of alcohol abuse, still currently drinking, presented to Dr. Celesta Aver office earlier today prior to this admission with main concern of progressively worsening swelling in the feet and ankles, abdomen, fatigue and poor oral intake. In addition patient reported diffuse body pains over the past several weeks. Patient denies any specific symptom like chest pain or shortness of breath, denies fevers or chills. In the office, patient notably jaundiced with swelling in feet and ankles, lower extremities, abdomen distended. Tried hospitalist asked to admit for further evaluation and management. Please note that patient is currently rather tired and fairly difficult to obtain detailed history from. Per earlier report review, wife reported that patient has been through alcohol rehabilitation but that has not work as patient is still actively drinking and wife noted some episodes of confusion at home.  Assessment/Plan:    Active Problems:  Alcoholic hepatitis with ascites - appreciate IR assistance, pt is now status post paracentesis with 3 L fluid removed and sent for analysis 4/14 - Weight remains stable at 212 LBS - Patient says he was 233 pounds before coming to the hospital - appreciate GI team recommendations - Continue diuretics spironolactone and Lasix - Plan on repeat paracentesis today, we'll give albumin with paracentesis as recommended per GI team - Remove 4 - 6 L if possible  Transaminitis with hyperbilirubinemia - alcohol induced hepatitis, LFT's overall unchanged, slightly better  - Bilirubstill elevated  - Jaundiced skin, continue to monitor LFTs   Acute and intermittent  encephalopathy - hepatic encephalopathy, ammonia level still elevated, discussed with gastroenterologist on call - No need to check ammonia level as it does not correlate clearly to mental status in patients with end-stage liver disease  - Continue rifaximin and lactulose  - If patient continues doing well, plan to discharge to inpatient rehabilitation within 24-48 hours if bed available   Metastatic prostate cancer  - notified oncologist of patient's admission  - holding Xtandia   Hyperlipidemia - Holding statin    Diarrhea - resolved, C. difficile never collected as diarrhea resolved    Thrombocytopenia - from alcohol induced bone marrow damage - Platelets improving  - no signs of bleeding - repeat CBC in AM   Severe PCM - in the setting of metastatic prostate cancer and alcohol use, poor oral intake - nutritionist consulted  - continue to provide ensure    Coagulopathy - With INR 1.51 in the setting of alcoholic liver disease - no signs of bleeding   Hyponatremia - Secondary to dehydration in the setting of alcohol intoxication - Na is trending up from 127 --> 130 --> 131 --> 132 --> 131 --> 130 - repeat BMP in AM   Anemia of chronic disease, malignancy and alcohol use - no sings of bleeding - repeat CBC in AM  Alcohol intoxication in patient with known chronic alcohol abuse - Level greater than 136 on admission and undetectable this AM  - monitor on CIWA protocol, continue thiamine, MVI, folate   - placed on Librium as patient was taken in the past to see if it works better for him   Hypertension, Essential - Continue spironolactone and Lasix - resumed sotalol at the lower  dose 80 mg QD due to still lower BP   DVT prophylaxis - SCDs  Code Status: Full.  Family Communication:  plan of care discussed with the patient, wife ad children at bedside, spent over 45 minutes discussing diagnostic tests and results, progression of clinical condition and plan of care, all  questions answered Disposition Plan: possible inpatient rehabilitation in 24-48 hours   IV access:  Peripheral IV  Procedures and diagnostic studies:    Ct Abdomen Pelvis W Contrast   11/11/2014   Hepatosplenomegaly with geographic fatty sparing or inflammation in the liver, potentially a result of cirrhosis or hepatitis. The degree of narrowing of the hepatic veins and intrahepatic IVC is worsened suggesting acute swelling of the liver. No vascular thrombosis observed. 2. Ascites. 3. Probably reactive adenopathy in the gastrohepatic ligament and porta hepatis. Mesenteric edema noted. 4. There is some mild irregularity of the lumen in the cecum. Cecal mass not readily excluded. In the nonacute setting consider colonoscopy if not recently performed. 5. Scattered sclerotic lesions from prior metastatic disease. Compared to the 2013 CT scan, I do see a new small sclerotic metastatic lesion anteriorly in the right L2 vertebra. 6. Atherosclerosis, including the coronary arteries. 7. Scattered atelectasis in the lung bases.    US Paracentesis   11/19/2014  Successful ultrasound-guided paracentesis yielding 3 liters of peritoneal fluid.   Medical Consultants:  GI IR Inpatient rehabilitation specialist   Other Consultants:  None  IAnti-Infectives:   None   Faye Ramsay, MD  [facility] Pager [phone]  If 7PM-7AM, please contact night-coverage [email or web address] Password [email or web address] 11/23/2014, 1:20 PM   LOS: 5 days   HPI/Subjective: Persistent diarrhea but overall better.   Objective: Filed Vitals:   11/22/14 1500 11/22/14 2141 11/23/14 0045 11/23/14 0545  BP: 118/75 92/54 135/95 113/72  Pulse: 85 80 73 77  Temp: 98.3 F (36.8 C) 98.7 F (37.1 C)  98.5 F (36.9 C)  TempSrc: Oral Oral  Oral  Resp: 16 18  18   Height:      Weight:      SpO2: 94% 95%  96%    Intake/Output Summary (Last 24 hours) at 11/23/14 1320 Last data filed at 11/23/14 0829  Gross per 24 hour  Intake    480 ml  Output       0 ml  Net    480 ml    Exam:   General:  Pt is alert, follows commands appropriately, not in acute distress, jaundiced skin   Cardiovascular: Regular rate and rhythm, S1/S2, no murmurs, no rubs, no gallops  Respiratory: Clear to auscultation bilaterally, no wheezing, diminished breath sounds at bases   Abdomen: Soft, non tender, still distended, bowel sounds present, no guarding  Extremities: trace bilateral LE pitting edema, pulses DP and PT palpable bilaterally  Data Reviewed: Basic Metabolic Panel:  Recent Labs Lab 11/18/14 1407 11/19/14 0525 11/20/14 0550 11/21/14 0542 11/22/14 0527 11/23/14 0556  NA 130* 131* 131* 132* 131* 130*  K 3.8 4.2 4.0 4.5 4.1 4.3  CL 98 100 101 100 101 101  CO2 21 25 24 26 23 23   GLUCOSE 94 109* 90 92 101* 89  BUN 7 8 13 18 19 18   CREATININE 0.64 0.73 0.69 0.75 0.86 0.84  CALCIUM 7.2* 7.9* 7.7* 8.0* 7.5* 7.4*  MG 2.0  --   --   --   --   --    Liver Function Tests:  Recent Labs Lab 11/19/14 0525 11/20/14 0550 11/21/14 0542 11/22/14 0527 11/23/14  0556  AST 164* 142* 150* 148* 149*  ALT 36 32 36 39 44  ALKPHOS 187* 155* 169* 161* 150*  BILITOT 11.4* 11.7* 12.0* 11.6* 12.1*  PROT 7.5 7.1 7.6 7.2 7.2  ALBUMIN 2.4* 2.2* 2.4* 2.3* 2.2*    Recent Labs Lab 11/19/14 1039 11/20/14 0550 11/21/14 0542 11/22/14 0527 11/23/14 0556  AMMONIA 90* 50* 51* 70* 67*   CBC:  Recent Labs Lab 11/19/14 0525 11/19/14 1039 11/20/14 0550 11/21/14 0542 11/23/14 0556  WBC 9.6 8.3 9.1 10.5 10.4  HGB 11.0* 10.8* 10.2* 10.9* 10.4*  HCT 32.2* 32.2* 30.2* 32.7* 30.7*  MCV 119.7* 119.3* 120.8* 121.6* 122.3*  PLT 101* 110* 98* 108* 121*    Scheduled Meds: . albumin human  75 g Intravenous Once  . clopidogrel  75 mg Oral Daily  . feeding supplement (ENSURE ENLIVE)  237 mL Oral BID BM  . folic acid  1 mg Oral Daily  . furosemide  10 mg Oral Daily  . lactulose  10 g Oral BID  . metoprolol succinate  12.5 mg Oral Daily  . multivitamin  with minerals  1 tablet Oral Daily  . potassium chloride SA  20 mEq Oral BID  . rifaximin  550 mg Oral BID  . sodium chloride  3 mL Intravenous Q12H  . sotalol  80 mg Oral Q12H  . spironolactone  25 mg Oral Daily  . thiamine  100 mg Oral Daily   Or  . thiamine  100 mg Intravenous Daily   Continuous Infusions:

## 2014-11-24 ENCOUNTER — Other Ambulatory Visit: Payer: Medicare Other

## 2014-11-24 ENCOUNTER — Ambulatory Visit: Payer: Medicare Other | Admitting: Oncology

## 2014-11-24 LAB — COMPREHENSIVE METABOLIC PANEL
ALK PHOS: 151 U/L — AB (ref 39–117)
ALT: 49 U/L (ref 0–53)
ANION GAP: 5 (ref 5–15)
AST: 148 U/L — ABNORMAL HIGH (ref 0–37)
Albumin: 2.4 g/dL — ABNORMAL LOW (ref 3.5–5.2)
BUN: 19 mg/dL (ref 6–23)
CO2: 24 mmol/L (ref 19–32)
Calcium: 7.7 mg/dL — ABNORMAL LOW (ref 8.4–10.5)
Chloride: 105 mmol/L (ref 96–112)
Creatinine, Ser: 0.84 mg/dL (ref 0.50–1.35)
GFR calc Af Amer: >90 mL/min (ref >90)
GFR, EST NON AFRICAN AMERICAN: 89 mL/min — AB (ref >90)
GLUCOSE: 85 mg/dL (ref 70–99)
POTASSIUM: 4.1 mmol/L (ref 3.5–5.1)
Sodium: 134 mmol/L — ABNORMAL LOW (ref 135–145)
Total Bilirubin: 12.9 mg/dL — ABNORMAL HIGH (ref 0.3–1.2)
Total Protein: 7.4 g/dL (ref 6.0–8.3)

## 2014-11-24 LAB — CBC
HCT: 30.8 % — ABNORMAL LOW (ref 39.0–52.0)
HEMOGLOBIN: 10.3 g/dL — AB (ref 13.0–17.0)
MCH: 41.2 pg — ABNORMAL HIGH (ref 26.0–34.0)
MCHC: 33.4 g/dL (ref 30.0–36.0)
MCV: 123.2 fL — ABNORMAL HIGH (ref 78.0–100.0)
PLATELETS: 124 10*3/uL — AB (ref 150–400)
RBC: 2.5 MIL/uL — ABNORMAL LOW (ref 4.22–5.81)
RDW: 21 % — ABNORMAL HIGH (ref 11.5–15.5)
WBC: 9.6 10*3/uL (ref 4.0–10.5)

## 2014-11-24 LAB — PROTIME-INR
INR: 1.56 — AB (ref 0.00–1.49)
PROTHROMBIN TIME: 18.8 s — AB (ref 11.6–15.2)

## 2014-11-24 LAB — AMMONIA: AMMONIA: 45 umol/L — AB (ref 11–32)

## 2014-11-24 MED ORDER — CHLORDIAZEPOXIDE HCL 25 MG PO CAPS: 25.0000 mg | ORAL_CAPSULE | Freq: Three times a day (TID) | ORAL | Status: DC

## 2014-11-24 MED ORDER — TEMAZEPAM 7.5 MG PO CAPS
7.5000 mg | ORAL_CAPSULE | Freq: Every evening | ORAL | Status: DC | PRN
Start: 2014-11-24 — End: 2014-12-06

## 2014-11-24 MED ORDER — FUROSEMIDE 20 MG PO TABS: 10.0000 mg | ORAL_TABLET | Freq: Every day | ORAL | Status: DC

## 2014-11-24 MED ORDER — SOTALOL HCL 80 MG PO TABS
80.0000 mg | ORAL_TABLET | Freq: Every day | ORAL | Status: DC
Start: 2014-11-24 — End: 2014-12-29

## 2014-11-24 MED ORDER — SPIRONOLACTONE 25 MG PO TABS: 25.0000 mg | ORAL_TABLET | Freq: Every day | ORAL | Status: DC

## 2014-11-24 MED ORDER — METOPROLOL SUCCINATE ER 25 MG PO TB24: 12.5000 mg | ORAL_TABLET | Freq: Every day | ORAL | Status: DC

## 2014-11-24 MED ORDER — LACTULOSE 10 GM/15ML PO SOLN: 10.0000 g | Freq: Two times a day (BID) | ORAL | Status: DC

## 2014-11-24 NOTE — Progress Notes (Signed)
Inpatient Rehabilitation  I unfortunately do not have a bed available to offer  Mr. Layton  I have notified his wife Dorian Pod by phone, as well as Claiborne Billings with SW and Seth Bake with CM.  Wife appears anxious about the prospect of pt. [place] soon , and she states "he is still very sick".  I will sign off.  Please call if questions.   [place] Admissions Coordinator Cell [phone] Office [phone]

## 2014-11-24 NOTE — Progress Notes (Signed)
CARE MANAGEMENT NOTE 11/24/2014  Patient:  Allen Schroeder, Allen Schroeder   Account Number:  [id number]  Date Initiated:  11/23/2014  Documentation initiated by:  Edwyna Shell  Subjective/Objective Assessment:   64 yo male admitted with alcoholic hepatitis with ascies from home with spouse     Action/Plan:   discharge planning   Anticipated DC Date:  11/24/2014   Anticipated DC Plan:  [place]  In-house referral  Clinical Social Worker      DC Planning Services  CM consult      Choice offered to / List presented to:             Status of service:  In process, will continue to follow Medicare Important Message given?  YES (If response is "NO", the following Medicare IM given date fields will be blank) Date Medicare IM given:  11/23/2014 Medicare IM given by:  Edwyna Shell Date Additional Medicare IM given:   Additional Medicare IM given by:    Discharge Disposition:  [place]  Per UR Regulation:    If discussed at Long Length of Stay Meetings, dates discussed:    Comments:  11/24/14 Charise Killian RN BSN CM [phone] Received call from Manuela Schwartz at [facility] and they do not have any available beds to offer patient. This CM then spoke with patient spouse and attending MD regarding discharging to SNF. Spouse working with CSW for facility placement.  11/23/14 Edwyna Shell RN BSN CM [phone] Spoke with patient and spouse Dorian Pod regarding CIR referral but also discussed second plan pending bed availability at CIR, at time of discharge. Spouse and patient prefer CIR although understand the need for a secondary plan and agreed to a SNF search for rehab. Explained that SCW will follow with bed offers from SNF's and can explain the transition. Spouse Dorian Pod stated that it is not an option for the patient to come home at this time. Will continue to follow with dc plan

## 2014-11-24 NOTE — Progress Notes (Signed)
NUTRITION FOLLOW-UP   INTERVENTION: - Continue Ensure Enlive po BID, each supplement provides 350 kcal and 20 grams of protein - Continue multivitamin and thiamin supplements  - RD will continue to monitor  NUTRITION DIAGNOSIS: Increased nutrient needs related to protein and calories as evidenced by alcoholic hepatitis with ascites. -ongoing  Goal: Pt to meet >/= 90% of their estimated nutrition needs -variably met  Monitor:  Weight trend, po intake of meals and supplements, I/O's, labs  64 y.o. male  Admitting Dx: alcoholic hepatitis with ascites  ASSESSMENT: 64 year old male with known metastatic prostate cancer, under the care of Dr. [name], long history of alcohol abuse, still currently drinking, presented to Dr. Celesta Aver office earlier today prior to this admission with main concern of progressively worsening swelling in the feet and ankles, abdomen, fatigue and poor oral intake.  4/14: - Pt with alcoholic hepatitis with ascites.  - Pt reports that he has been eating poorly PTA. Today he had yogurt and tea. He says that his appetite is improving.  - Wt loss r/t paracentesis (3L) this am. Unsure of dry weight. - Pt agreed to nutritional supplements to improve po intake.  - Mild muscle depletion at pt's temples.  - Labs and medications reviewed  Na 131  K WNL  Albumin 2.4  4/19: - Pt eating 50-100% since last assessment - D/c summary in 30 minutes ago - Pt meeting needs on average at this time - Labs and medications reviewed; Na: 134 mmol/L  Height: Ht Readings from Last 1 Encounters:  11/18/14 _0  (1.88 m)    Weight: Wt Readings from Last 1 Encounters:  11/24/14 213 lb 11.2 oz (96.934 kg)    Wt Readings from Last 10 Encounters:  11/24/14 213 lb 11.2 oz (96.934 kg)  11/18/14 233 lb (105.688 kg)  11/10/14 230 lb 11.2 oz (104.645 kg)  08/25/14 231 lb 11.2 oz (105.098 kg)  06/22/14 231 lb 1.9 oz (104.835 kg)  05/27/14 232 lb 12.8 oz (105.597 kg)  03/05/14  235 lb 8 oz (106.822 kg)  11/27/13 230 lb 12.8 oz (104.69 kg)  10/24/13 230 lb (104.327 kg)  06/24/13 226 lb (102.513 kg)    BMI:  Body mass index is 27.43 kg/(m^2).  Estimated Nutritional Needs: Kcal: 2300-2500 Protein: 110-120 grams Fluid: Per MD  Skin: WNL  Diet Order: Diet 2 gram sodium Room service appropriate?: Yes; Fluid consistency:: Thin   No intake or output data in the 24 hours ending 11/24/14 1301  Last BM: 4/19   Labs:   Recent Labs Lab 11/18/14 1407  11/22/14 0527 11/23/14 0556 11/24/14 0540  NA 130*  < > 131* 130* 134*  K 3.8  < > 4.1 4.3 4.1  CL 98  < > 101 101 105  CO2 21  < > _1 BUN 7  < > _2 CREATININE 0.64  < > 0.86 0.84 0.84  CALCIUM 7.2*  < > 7.5* 7.4* 7.7*  MG 2.0  --   --   --   --   GLUCOSE 94  < > 101* 89 85  < > = values in this interval not displayed.  CBG (last 3)  No results for input(s): GLUCAP in the last 72 hours.  Scheduled Meds: . albumin human  75 g Intravenous Once  . chlordiazePOXIDE  25 mg Oral TID  . clopidogrel  75 mg Oral Daily  . feeding supplement (ENSURE ENLIVE)  237 mL Oral BID BM  . folic  acid  1 mg Oral Daily  . furosemide  10 mg Oral Daily  . lactulose  10 g Oral BID  . metoprolol succinate  12.5 mg Oral Daily  . multivitamin with minerals  1 tablet Oral Daily  . potassium chloride SA  20 mEq Oral BID  . rifaximin  550 mg Oral BID  . sodium chloride  3 mL Intravenous Q12H  . sotalol  80 mg Oral Q12H  . spironolactone  25 mg Oral Daily  . thiamine  100 mg Oral Daily   Or  . thiamine  100 mg Intravenous Daily    Continuous Infusions:   Past Medical History  Diagnosis Date  . Atrial fibrillation   . Other and unspecified hyperlipidemia   . Unspecified essential hypertension   . Malignant neoplasm of prostate 11/2007    Mets to Bone  . Elevated prostate specific antigen (PSA)   . Personal history of colonic polyps   . Hypocalcemia   . Acute alcoholic hepatitis   . Essential and  other specified forms of tremor   . Anxiety state, unspecified   . Family history of malignant neoplasm of gastrointestinal tract   . Alcohol abuse with physiological dependence 02/13/2012  . 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 Alcoholic hepatitis with ascites 11/13/2014    Past Surgical History  Procedure Laterality Date  . Vasectomy    . Cholecystectomy    . Coronary stent placement  2011  . Colonoscopy w/ biopsies      Jarome Matin, RD, LDN Inpatient Clinical Dietitian Pager # [phone] After hours/weekend pager # [phone]

## 2014-11-24 NOTE — Progress Notes (Signed)
Patient is set to discharge to [facility] today. Patient & wife, Dorian Pod aware. Discharge packet given to RN, Joy. Patient's wife to transport to SNF.   Clinical Social Work Department CLINICAL SOCIAL WORK PLACEMENT NOTE 11/24/2014  Patient:  Allen Schroeder, Allen Schroeder  Account Number:  [id number] Admit date:  11/18/2014  Clinical Social Worker:  Renold Genta  Date/time:  11/23/2014 05:57 PM  Clinical Social Work is seeking post-discharge placement for this patient at the following level of care:   SKILLED NURSING   (*CSW will update this form in Epic as items are completed)   11/23/2014  Patient/family provided with [place] Department of Clinical Social Work's list of facilities offering this level of care within the geographic area requested by the patient (or if unable, by the patient's family).  11/23/2014  Patient/family informed of their freedom to choose among providers that offer the needed level of care, that participate in Medicare, Medicaid or managed care program needed by the patient, have an available bed and are willing to accept the patient.  11/23/2014  Patient/family informed of [facility]' ownership interest in [facility], as well as of the fact that they are under no obligation to receive care at this facility.  PASARR submitted to EDS on 11/23/2014 PASARR number received on 11/23/2014  FL2 transmitted to all facilities in geographic area requested by pt/family on  11/23/2014 FL2 transmitted to all facilities within larger geographic area on   Patient informed that his/her managed care company has contracts with or will negotiate with  certain facilities, including the following:     Patient/family informed of bed offers received:  11/23/2014 Patient chooses bed at [place] Physician recommends and patient chooses bed at    Patient to be transferred to [place] on  11/24/2014 Patient to be  transferred to facility by patient's wife via car Patient and family notified of transfer on 11/24/2014 Name of family member notified:  patient's wife at bedside  The following physician request were entered in Epic:   Additional Comments:     Raynaldo Opitz, [place] Social Worker cell #: [phone]

## 2014-11-24 NOTE — Discharge Instructions (Signed)
Ascites °Ascites is a gathering of fluid in the belly (abdomen). This is most often caused by liver disease. It may also be caused by a number of other less common problems. It causes a ballooning out (distension) of the abdomen. °CAUSES  °Scarring of the liver (cirrhosis) is the most common cause of ascites. Other causes include: °· Infection or inflammation in the abdomen. °· Cancer in the abdomen. °· Heart failure. °· Certain forms of kidney failure (nephritic syndrome). °· Inflammation of the pancreas. °· Clots in the veins of the liver. °SYMPTOMS  °In the early stages of ascites, you may not have any symptoms. The main symptom of ascites is a sense of abdominal bloating. This is due to the presence of fluid. This may also cause an increase in abdominal or waist size. People with this condition can develop swelling in the legs, and men can develop a swollen scrotum. When there is a lot of fluid, it may be hard to breath. Stretching of the abdomen by fluid can be painful. °DIAGNOSIS  °Certain features of your medical history, such as a history of liver disease and of an enlarging abdomen, can suggest the presence of ascites. The diagnosis of ascites can be made on physical exam by your caregiver. An abdominal ultrasound examination can confirm that ascites is present, and estimate the amount of fluid. °Once ascites is confirmed, it is important to determine its cause. Again, a history of one of the conditions listed in "CAUSES" provides a strong clue. A physical exam is important, and blood and X-ray tests may be needed. During a procedure called paracentesis, a sample of fluid is removed from the abdomen. This can determine certain key features about the fluid, such as whether or not infection or cancer is present. Your caregiver will determine if a paracentesis is necessary. They will describe the procedure to you. °PREVENTION  °Ascites is a complication of other conditions. Therefore to prevent ascites, you  must seek treatment for any significant health conditions you have. Once ascites is present, careful attention to fluid and salt intake may help prevent it from getting worse. If you have ascites, you should not drink alcohol. °PROGNOSIS  °The prognosis of ascites depends on the underlying disease. If the disease is reversible, such as with certain infections or with heart failure, then ascites may improve or disappear. When ascites is caused by cirrhosis, then it indicates that the liver disease has worsened, and further evaluation and treatment of the liver disease is needed. If your ascites is caused by cancer, then the success or failure of the cancer treatment will determine whether your ascites will improve or worsen. °RISKS AND COMPLICATIONS  °Ascites is likely to worsen if it is not properly diagnosed and treated. A large amount of ascites can cause pain and difficulty breathing. The main complication, besides worsening, is infection (called spontaneous bacterial peritonitis). This requires prompt treatment. °TREATMENT  °The treatment of ascites depends on its cause. When liver disease is your cause, medical management using water pills (diuretics) and decreasing salt intake is often effective. Ascites due to peritoneal inflammation or malignancy (cancer) alone does not respond to salt restriction and diuretics. Hospitalization is sometimes required. °If the treatment of ascites cannot be managed with medications, a number of other treatments are available. Your caregivers will help you decide which will work best for you. Some of these are: °· Removal of fluid from the abdomen (paracentesis). °· Fluid from the abdomen is passed into a vein (peritoneovenous shunting). °·   Liver transplantation. °· Transjugular intrahepatic portosystemic stent shunt. °HOME CARE INSTRUCTIONS  °It is important to monitor body weight and the intake and output of fluids. Weigh yourself at the same time every day. Record your  weights. Fluid restriction may be necessary. It is also important to know your salt intake. The more salt you take in, the more fluid you will retain. Ninety percent of people with ascites respond to this approach. °· Follow any directions for medicines carefully. °· Follow up with your caregiver, as directed. °· Report any changes in your health, especially any new or worsening symptoms. °· If your ascites is from liver disease, avoid alcohol and other substances toxic to the liver. °SEEK MEDICAL CARE IF:  °· Your weight increases more than a few pounds in a few days. °· Your abdominal or waist size increases. °· You develop swelling in your legs. °· You had swelling and it worsens. °SEEK IMMEDIATE MEDICAL CARE IF:  °· You develop a fever. °· You develop new abdominal pain. °· You develop difficulty breathing. °· You develop confusion. °· You have bleeding from the mouth, stomach, or rectum. °MAKE SURE YOU:  °· Understand these instructions. °· Will watch your condition. °· Will get help right away if you are not doing well or get worse. °Document Released: 07/24/2005 Document Revised: 10/16/2011 Document Reviewed: 02/22/2007 °ExitCare® Patient Information ©2015 [employer]. This information is not intended to replace advice given to you by your health care provider. Make sure you discuss any questions you have with your health care provider. ° °

## 2014-11-24 NOTE — Progress Notes (Signed)
    Progress Note   Subjective   No complaints today.    Objective   Vital signs in last 24 hours: Temp:  [97.6 F (36.4 C)-98 F (36.7 C)] 97.6 F (36.4 C) (04/19 0559) Pulse Rate:  [64-81] 64 (04/19 0559) Resp:  [19-22] 20 (04/19 0559) BP: (101-126)/(62-84) 101/62 mmHg (04/19 0559) SpO2:  [95 %-97 %] 95 % (04/19 0559) Weight:  [213 lb 11.2 oz (96.934 kg)] 213 lb 11.2 oz (96.934 kg) (04/19 0559) Last BM Date: 11/22/14 General:    Pleasant white male in NAD Heart:  Regular rate and rhythm Abdomen:  Soft, nontender and nondistended. Normal bowel sounds. Extremities:  Without edema. Neurologic:  Alert and oriented,  grossly normal neurologically. Psych:  Cooperative. Normal mood and affect.  Intake/Output from previous day: 04/18 0701 - 04/19 0700 In: 240 [P.O.:240] Out: -  Intake/Output this shift:    Lab Results:  Recent Labs  11/23/14 0556 11/24/14 0540  WBC 10.4 9.6  HGB 10.4* 10.3*  HCT 30.7* 30.8*  PLT 121* 124*   BMET  Recent Labs  11/22/14 0527 11/23/14 0556 11/24/14 0540  NA 131* 130* 134*  K 4.1 4.3 4.1  CL 101 101 105  CO2 23 23 24   GLUCOSE 101* 89 85  BUN 19 18 19   CREATININE 0.86 0.84 0.84  CALCIUM 7.5* 7.4* 7.7*   LFT  Recent Labs  11/24/14 0540  PROT 7.4  ALBUMIN 2.4*  AST 148*  ALT 49  ALKPHOS 151*  BILITOT 12.9*   PT/INR  Recent Labs  11/23/14 0556 11/24/14 0540  LABPROT 18.3* 18.8*  INR 1.51* 1.56*      Assessment / Plan:   1. ETOH hepatitis complicated by portal HTN. Hopefully he doesn't have underlying cirrhosis but not clear at this point. Discriminant function 45, it was 40 on admission. Prednisolone not started. Transaminases improving, bili continues to slowly rise (not uncommon in ETOH hepatitis). Coags stable. Mentation normal on Xifaxan. Awaiting inpatient rehab for PT and OT. Long discussion with wife about her concerns regarding ETOH use / liver disease. Patient opposed to inpatient or outpatient ETOH  rehab.  2. Metastatic prostate cancer.       LOS: 6 days   Tye Savoy  11/24/2014, 10:33 AM

## 2014-11-24 NOTE — Progress Notes (Signed)
Patient ready for discharge. Family will provide transportation.

## 2014-11-24 NOTE — Progress Notes (Signed)
Pt had small formed BM.

## 2014-11-24 NOTE — Progress Notes (Signed)
Called report to [facility], nurse at [place]. All questions answered and name and callback number left. Patient will be transported by family.

## 2014-11-24 NOTE — Progress Notes (Signed)
Physical Therapy Treatment Patient Details Name: STRUMMER CANIPE MRN: [id number] DOB: 1950-11-14 Today's Date: 11/24/2014    History of Present Illness pt admit with increased swelling in LEs and abdomen and weakness. Found to have alcohol induced hepatitis and ascites (3 L pulled 4/14) , acute on chronic encephalopathy, metastatic prostate cancer.     PT Comments    Pt in bed;NT stated pt was ambulating without assistive device; pt OOB to EOB; pt no c/o of pain; Assisted pt to BR from EOB; Pt no bowel movnmt stated "only gas"; Ambulated w/ walker in unit hall 130 ft;  pt requires assistive device for balance and safety; during ambulating pt LOB and c/o of legs getting tired; Increased WB in UE w/ walker; lateral LOB towards Left; attempeted to have pt ambulate without assistive device 91ft pt increased unsteadiness and LOB; too unsteady with out AD. Rec use of walker.  returned pt room and into chair.   Follow Up Recommendations  CIR;SNF     Equipment Recommendations  Rolling walker with 5" wheels    Recommendations for Other Services       Precautions / Restrictions Precautions Precautions: Fall Restrictions Weight Bearing Restrictions: No    Mobility  Bed Mobility Overal bed mobility: Needs Assistance;Modified Independent Bed Mobility: Supine to Sit     Supine to sit: HOB elevated;Supervision     General bed mobility comments: Pt supervision able to sit on EOB  Transfers Overall transfer level: Needs assistance Equipment used: Rolling walker (2 wheeled) Transfers: Sit to/from Stand Sit to Stand: Min guard         General transfer comment: Pt required no cues for hand placement.   Ambulation/Gait Ambulation/Gait assistance: Min guard;Supervision Ambulation Distance (Feet): 130 Feet Assistive device: Rolling walker (2 wheeled) Gait Pattern/deviations: Step-through pattern;Drifts right/left;Decreased stride length Gait velocity: slow   General Gait Details: Pt  unsteady during ambulating; decrease in balance; cues to stay close within walker when crossing over threshold.    Stairs            Wheelchair Mobility    Modified Rankin (Stroke Patients Only)       Balance                                    Cognition Arousal/Alertness: Awake/alert Behavior During Therapy: WFL for tasks assessed/performed Overall Cognitive Status: Within Functional Limits for tasks assessed                      Exercises      General Comments        Pertinent Vitals/Pain Pain Assessment: No/denies pain    Home Living                      Prior Function            PT Goals (current goals can now be found in the care plan section) Progress towards PT goals: Progressing toward goals    Frequency  Min 3X/week    PT Plan      Co-evaluation             End of Session Equipment Utilized During Treatment: Gait belt Activity Tolerance: Patient tolerated treatment well Patient left: in chair;with call bell/phone within reach;with chair alarm set;with family/visitor present     Time:   1205-1228    Charges:    1 gt  1 ta                   G Codes:      [place] PTA  11/24/2014, 12:53 PM  Reviewed and agree with above  Rica Koyanagi  PTA [facility]  Acute  Rehab Pager      [phone]

## 2014-11-24 NOTE — Discharge Summary (Signed)
Physician Discharge Summary  Allen Schroeder IRS:[id number] DOB: November 25, 1950 DOA: 11/18/2014  PCP: Allen Rusk, MD  Admit date: 11/18/2014 Discharge date: 11/24/2014  Recommendations for Outpatient Follow-up:  1. Pt will need to follow up with PCP in 2 weeks post discharge 2. Please obtain CMP to evaluate electrolytes and kidney function 3. Please also check CBC to evaluate Hg and Hct levels 4. Please note that pt is to take lasix and spironolactone as noted below 5. Weight on discharge is 213 lbs   Discharge Diagnoses:  Active Problems:   Alcoholic hepatitis with ascites   Abdominal pain   Alcohol withdrawal   Alcohol abuse   Portal hypertension   Jaundice   Thrombocytopenia   Generalized anxiety disorder  Discharge Condition: Stable  Diet recommendation: Heart healthy diet discussed in details    Brief narrative:    64 year old male with known metastatic prostate cancer, under the care of Dr. Alen Schroeder, long history of alcohol abuse, still currently drinking, presented to Dr. Celesta Schroeder office earlier today prior to this admission with main concern of progressively worsening swelling in the feet and ankles, abdomen, fatigue and poor oral intake. In addition patient reported diffuse body pains over the past several weeks. Patient denies any specific symptom like chest pain or shortness of breath, denies fevers or chills. In the office, patient notably jaundiced with swelling in feet and ankles, lower extremities, abdomen distended. Tried hospitalist asked to admit for further evaluation and management. Please note that patient is currently rather tired and fairly difficult to obtain detailed history from. Per earlier report review, wife reported that patient has been through alcohol rehabilitation but that has not work as patient is still actively drinking and wife noted some episodes of confusion at home.  Assessment/Plan:    Active Problems:  Alcoholic hepatitis with ascites -  appreciate IR assistance, pt is now status post paracentesis with 3 L fluid removed and sent for analysis 4/14 - Weight remains stable at 212 LBS - Patient says he was 233 pounds before coming to the hospital - appreciate GI team recommendations - Continue diuretics spironolactone and Lasix  Transaminitis with hyperbilirubinemia - alcohol induced hepatitis, LFT's overall unchanged, slightly better  - Bilirub stil elevated  - Jaundiced skin, continue to monitor LFTs  Acute and intermittent encephalopathy - hepatic encephalopathy, ammonia level still elevated but trending down  - No need to check ammonia level as it does not correlate clearly to mental status in patients with end-stage liver disease  - Continue lactulose   Metastatic prostate cancer  - notified oncologist of patient's admission  - holding Xtandia   Hyperlipidemia - Holding statin   Diarrhea - resolved, C. difficile never collected as diarrhea resolved   Thrombocytopenia - from alcohol induced bone marrow damage - Platelets improving  - no signs of bleeding  Severe PCM - in the setting of metastatic prostate cancer and alcohol use, poor oral intake - nutritionist consulted  - continue to provide ensure   Coagulopathy - With INR 1.56 in the setting of alcoholic liver disease - no signs of bleeding   Hyponatremia - Secondary to dehydration in the setting of alcohol intoxication - Na is trending up from 127 --> 130 --> 131 --> 132 --> 131 --> 130 --> 134  Anemia of chronic disease, malignancy and alcohol use - no sings of bleeding  Alcohol intoxication in patient with known chronic alcohol abuse - Level greater than 136 on admission and undetectable 48 hours after admission  -  placed on Librium as patient was taken in the past to see if it works better for him   Hypertension, Essential - Continue spironolactone and Lasix - resumed sotalol at the lower dose 80 mg QD due to still lower BP    DVT prophylaxis - SCDs  Code Status: Full.  Family Communication: plan of care discussed with the patient, wife at bedside  Disposition Plan: SNF today   IV access:  Peripheral IV  Procedures and diagnostic studies:   Ct Abdomen Pelvis W Contrast 11/11/2014 Hepatosplenomegaly with geographic fatty sparing or inflammation in the liver, potentially a result of cirrhosis or hepatitis. The degree of narrowing of the hepatic veins and intrahepatic IVC is worsened suggesting acute swelling of the liver. No vascular thrombosis observed. 2. Ascites. 3. Probably reactive adenopathy in the gastrohepatic ligament and porta hepatis. Mesenteric edema noted. 4. There is some mild irregularity of the lumen in the cecum. Cecal mass not readily excluded. In the nonacute setting consider colonoscopy if not recently performed. 5. Scattered sclerotic lesions from prior metastatic disease. Compared to the 2013 CT scan, I do see a new small sclerotic metastatic lesion anteriorly in the right L2 vertebra. 6. Atherosclerosis, including the coronary arteries. 7. Scattered atelectasis in the lung bases.   US Paracentesis 11/19/2014 Successful ultrasound-guided paracentesis yielding 3 liters of peritoneal fluid.   Medical Consultants:  GI IR Inpatient rehabilitation specialist  Other Consultants:  None  IAnti-Infectives:   None       Discharge Exam: Filed Vitals:   11/24/14 0559  BP: 101/62  Pulse: 64  Temp: 97.6 F (36.4 C)  Resp: 20   Filed Vitals:   11/23/14 0545 11/23/14 1322 11/23/14 2139 11/24/14 0559  BP: 113/72 126/84 113/63 101/62  Pulse: 77 81 73 64  Temp: 98.5 F (36.9 C) 98 F (36.7 C) 97.8 F (36.6 C) 97.6 F (36.4 C)  TempSrc: Oral Oral Oral Oral  Resp: 18 19 22 20   Height:      Weight:    96.934 kg (213 lb 11.2 oz)  SpO2: 96% 97% 96% 95%    General: Pt is alert, follows commands appropriately, not in acute distress, jaundiced skin Cardiovascular:  Regular rate and rhythm, S1/S2 +, no murmurs, no rubs, no gallops Respiratory: Clear to auscultation bilaterally, no wheezing, no crackles, no rhonchi Abdominal: Soft, non tender, slightly distended, bowel sounds +, no guarding Extremities: no edema, no cyanosis, pulses palpable bilaterally DP and PT  Discharge Instructions     Medication List    STOP taking these medications        naproxen sodium 220 MG tablet  Commonly known as:  ANAPROX     nitroGLYCERIN 0.4 MG SL tablet  Commonly known as:  NITROSTAT     rosuvastatin 20 MG tablet  Commonly known as:  CRESTOR     XTANDI 40 MG capsule  Generic drug:  enzalutamide      TAKE these medications        BENADRYL 25 MG tablet  Generic drug:  diphenhydrAMINE  Take 25 mg by mouth every 6 (six) hours as needed for allergies or sleep.     CALCIUM CARB-CHOLECALCIFEROL PO  Take 1 capsule by mouth daily.     chlordiazePOXIDE 25 MG capsule  Commonly known as:  LIBRIUM  Take 1 capsule (25 mg total) by mouth 3 (three) times daily.     clopidogrel 75 MG tablet  Commonly known as:  PLAVIX  TAKE ONE TABLET BY MOUTH ONCE DAILY  folic acid 1 MG tablet  Commonly known as:  FOLVITE  Take one tablet by mouth once a day....     furosemide 20 MG tablet  Commonly known as:  LASIX  Take 0.5 tablets (10 mg total) by mouth daily.     lactulose 10 GM/15ML solution  Commonly known as:  CHRONULAC  Take 15 mLs (10 g total) by mouth 2 (two) times daily.     metoprolol succinate 25 MG 24 hr tablet  Commonly known as:  TOPROL-XL  Take 0.5 tablets (12.5 mg total) by mouth daily.     multivitamin tablet  Take 1 tablet by mouth daily.     potassium chloride SA 20 MEQ tablet  Commonly known as:  K-DUR,KLOR-CON  Take 1 tablet (20 mEq total) by mouth 2 (two) times daily.     sotalol 80 MG tablet  Commonly known as:  BETAPACE  Take 1 tablet (80 mg total) by mouth daily. T     spironolactone 25 MG tablet  Commonly known as:  ALDACTONE   Take 1 tablet (25 mg total) by mouth daily.     temazepam 7.5 MG capsule  Commonly known as:  RESTORIL  Take 1 capsule (7.5 mg total) by mouth at bedtime as needed for sleep (give first dose tonight).     UNKNOWN TO PATIENT  Slow release hormone implant placed 8/13 by Dr. Willette Cluster.  Good for one year           Follow-up Information    Call Allen Rusk, MD.   Specialty:  Gastroenterology   Why:  As needed   Contact information:   [place] [place] [address] [phone]       Follow up with Faye Ramsay, MD.   Specialty:  Internal Medicine   Why:  As needed call my cell phone [phone]   Contact information:   [address] [place] [address] [phone]        The results of significant diagnostics from this hospitalization (including imaging, microbiology, ancillary and laboratory) are listed below for reference.     Microbiology: Recent Results (from the past 240 hour(s))  Body fluid culture     Status: None   Collection Time: 11/19/14  9:05 AM  Result Value Ref Range Status   Specimen Description ASCITIC  Final   Special Requests NONE  Final   Gram Stain   Final    NO WBC SEEN NO ORGANISMS SEEN Performed at [facility]    Culture   Final    NO GROWTH 3 DAYS Performed at [facility]    Report Status 11/22/2014 FINAL  Final     Labs: Basic Metabolic Panel:  Recent Labs Lab 11/18/14 1407  11/20/14 0550 11/21/14 0542 11/22/14 0527 11/23/14 0556 11/24/14 0540  NA 130*  < > 131* 132* 131* 130* 134*  K 3.8  < > 4.0 4.5 4.1 4.3 4.1  CL 98  < > 101 100 101 101 105  CO2 21  < > 24 26 23 23 24   GLUCOSE 94  < > 90 92 101* 89 85  BUN 7  < > 13 18 19 18 19   CREATININE 0.64  < > 0.69 0.75 0.86 0.84 0.84  CALCIUM 7.2*  < > 7.7* 8.0* 7.5* 7.4* 7.7*  MG 2.0  --   --   --   --   --   --   < > = values in  this interval not displayed. Liver Function Tests:  Recent Labs Lab  11/20/14 0550 11/21/14 0542 11/22/14 0527 11/23/14 0556 11/24/14 0540  AST 142* 150* 148* 149* 148*  ALT 32 36 39 44 49  ALKPHOS 155* 169* 161* 150* 151*  BILITOT 11.7* 12.0* 11.6* 12.1* 12.9*  PROT 7.1 7.6 7.2 7.2 7.4  ALBUMIN 2.2* 2.4* 2.3* 2.2* 2.4*    Recent Labs Lab 11/20/14 0550 11/21/14 0542 11/22/14 0527 11/23/14 0556 11/24/14 0540  AMMONIA 50* 51* 70* 67* 45*   CBC:  Recent Labs Lab 11/19/14 1039 11/20/14 0550 11/21/14 0542 11/23/14 0556 11/24/14 0540  WBC 8.3 9.1 10.5 10.4 9.6  HGB 10.8* 10.2* 10.9* 10.4* 10.3*  HCT 32.2* 30.2* 32.7* 30.7* 30.8*  MCV 119.3* 120.8* 121.6* 122.3* 123.2*  PLT 110* 98* 108* 121* 124*     SIGNED: Time coordinating discharge: 30 minutes  MAGICK-Evanell Redlich, MD  [facility] 11/24/2014, 12:19 PM Pager [phone]  If 7PM-7AM, please contact night-coverage [email or web address] Password [email or web address]

## 2014-11-25 ENCOUNTER — Encounter (HOSPITAL_COMMUNITY): Payer: Self-pay

## 2014-11-25 DIAGNOSIS — D689 Coagulation defect, unspecified: Secondary | ICD-10-CM | POA: Diagnosis not present

## 2014-11-25 DIAGNOSIS — C61 Malignant neoplasm of prostate: Secondary | ICD-10-CM | POA: Diagnosis not present

## 2014-11-25 DIAGNOSIS — K7290 Hepatic failure, unspecified without coma: Secondary | ICD-10-CM | POA: Diagnosis not present

## 2014-11-25 DIAGNOSIS — K7010 Alcoholic hepatitis without ascites: Secondary | ICD-10-CM | POA: Diagnosis not present

## 2014-11-27 ENCOUNTER — Telehealth: Payer: Self-pay | Admitting: Internal Medicine

## 2014-11-27 ENCOUNTER — Encounter (HOSPITAL_COMMUNITY): Payer: Self-pay

## 2014-11-27 NOTE — Telephone Encounter (Signed)
Pts wife called and would like for Dr. Carlean Purl to recommend and refer her husband to a PCP within [facility] that is familiar with pts that have alcoholic liver disease, prostate cancer, heart issues and will communicate with all of his doctors well. Pt is currently at [facility] rehab and she is not happy with the lack of communication between all caregivers. If possible there is a planning meeting regarding his care on Monday at 10am and she would love to have a name prior to that meeting. Please advise.

## 2014-11-30 ENCOUNTER — Encounter (HOSPITAL_COMMUNITY): Payer: Self-pay

## 2014-11-30 DIAGNOSIS — M545 Low back pain: Secondary | ICD-10-CM | POA: Diagnosis not present

## 2014-11-30 NOTE — Telephone Encounter (Signed)
Left message for patient to call back  

## 2014-11-30 NOTE — Telephone Encounter (Signed)
I spoke with the wife and advised her that Dr. Carlean Purl is not in the office and has not answered the message yet.  She wants a primary care in place prior to him leaving the rehab center.  I advised her that it may take several months to establish with a primary care, depending on who Dr. Carlean Purl recommends that he see.

## 2014-12-01 NOTE — Telephone Encounter (Signed)
Dr. Carlean Purl, please review the note and advise on your recommendation of a primary care

## 2014-12-01 NOTE — Telephone Encounter (Signed)
Spoke to wife She plans to see if Dr. Inda Merlin who has seen him at [facility] will help.  She knows to call me back if needed

## 2014-12-01 NOTE — Telephone Encounter (Signed)
I plan to call her to discuss options

## 2014-12-02 ENCOUNTER — Encounter (HOSPITAL_COMMUNITY): Payer: Self-pay

## 2014-12-02 DIAGNOSIS — W19XXXA Unspecified fall, initial encounter: Secondary | ICD-10-CM | POA: Diagnosis not present

## 2014-12-02 DIAGNOSIS — D689 Coagulation defect, unspecified: Secondary | ICD-10-CM | POA: Diagnosis not present

## 2014-12-02 DIAGNOSIS — K7010 Alcoholic hepatitis without ascites: Secondary | ICD-10-CM | POA: Diagnosis not present

## 2014-12-03 ENCOUNTER — Emergency Department (HOSPITAL_COMMUNITY): Payer: Medicare Other

## 2014-12-03 ENCOUNTER — Encounter (HOSPITAL_COMMUNITY): Payer: Self-pay | Admitting: *Deleted

## 2014-12-03 ENCOUNTER — Observation Stay (HOSPITAL_COMMUNITY)
Admission: EM | Admit: 2014-12-03 | Discharge: 2014-12-06 | Disposition: A | Discharge status: Home / Self Care | Payer: Medicare Other | Attending: Family Medicine | Admitting: Family Medicine

## 2014-12-03 DIAGNOSIS — R0902 Hypoxemia: Secondary | ICD-10-CM | POA: Insufficient documentation

## 2014-12-03 DIAGNOSIS — K7011 Alcoholic hepatitis with ascites: Secondary | ICD-10-CM | POA: Diagnosis not present

## 2014-12-03 DIAGNOSIS — E785 Hyperlipidemia, unspecified: Secondary | ICD-10-CM | POA: Insufficient documentation

## 2014-12-03 DIAGNOSIS — Z8042 Family history of malignant neoplasm of prostate: Secondary | ICD-10-CM | POA: Diagnosis not present

## 2014-12-03 DIAGNOSIS — K7290 Hepatic failure, unspecified without coma: Secondary | ICD-10-CM | POA: Diagnosis not present

## 2014-12-03 DIAGNOSIS — I959 Hypotension, unspecified: Secondary | ICD-10-CM

## 2014-12-03 DIAGNOSIS — Y92129 Unspecified place in nursing home as the place of occurrence of the external cause: Secondary | ICD-10-CM | POA: Insufficient documentation

## 2014-12-03 DIAGNOSIS — W1830XA Fall on same level, unspecified, initial encounter: Secondary | ICD-10-CM

## 2014-12-03 DIAGNOSIS — Z800 Family history of malignant neoplasm of digestive organs: Secondary | ICD-10-CM | POA: Diagnosis not present

## 2014-12-03 DIAGNOSIS — Z79899 Other long term (current) drug therapy: Secondary | ICD-10-CM | POA: Diagnosis not present

## 2014-12-03 DIAGNOSIS — I480 Paroxysmal atrial fibrillation: Secondary | ICD-10-CM | POA: Diagnosis not present

## 2014-12-03 DIAGNOSIS — Z86010 Personal history of colonic polyps: Secondary | ICD-10-CM | POA: Diagnosis not present

## 2014-12-03 DIAGNOSIS — S2241XA Multiple fractures of ribs, right side, initial encounter for closed fracture: Secondary | ICD-10-CM | POA: Diagnosis not present

## 2014-12-03 DIAGNOSIS — F1020 Alcohol dependence, uncomplicated: Secondary | ICD-10-CM | POA: Insufficient documentation

## 2014-12-03 DIAGNOSIS — K7010 Alcoholic hepatitis without ascites: Secondary | ICD-10-CM | POA: Diagnosis not present

## 2014-12-03 DIAGNOSIS — S2249XA Multiple fractures of ribs, unspecified side, initial encounter for closed fracture: Secondary | ICD-10-CM | POA: Insufficient documentation

## 2014-12-03 DIAGNOSIS — I10 Essential (primary) hypertension: Secondary | ICD-10-CM | POA: Diagnosis not present

## 2014-12-03 DIAGNOSIS — Z9181 History of falling: Secondary | ICD-10-CM | POA: Diagnosis not present

## 2014-12-03 DIAGNOSIS — R296 Repeated falls: Secondary | ICD-10-CM | POA: Diagnosis present

## 2014-12-03 DIAGNOSIS — S199XXA Unspecified injury of neck, initial encounter: Secondary | ICD-10-CM | POA: Diagnosis not present

## 2014-12-03 DIAGNOSIS — I9589 Other hypotension: Secondary | ICD-10-CM | POA: Diagnosis not present

## 2014-12-03 DIAGNOSIS — S064X0A Epidural hemorrhage without loss of consciousness, initial encounter: Secondary | ICD-10-CM | POA: Diagnosis not present

## 2014-12-03 DIAGNOSIS — W06XXXA Fall from bed, initial encounter: Secondary | ICD-10-CM | POA: Insufficient documentation

## 2014-12-03 DIAGNOSIS — S0003XA Contusion of scalp, initial encounter: Secondary | ICD-10-CM | POA: Insufficient documentation

## 2014-12-03 DIAGNOSIS — S2231XA Fracture of one rib, right side, initial encounter for closed fracture: Secondary | ICD-10-CM

## 2014-12-03 DIAGNOSIS — Z955 Presence of coronary angioplasty implant and graft: Secondary | ICD-10-CM | POA: Insufficient documentation

## 2014-12-03 DIAGNOSIS — S2239XA Fracture of one rib, unspecified side, initial encounter for closed fracture: Secondary | ICD-10-CM | POA: Insufficient documentation

## 2014-12-03 DIAGNOSIS — Z7902 Long term (current) use of antithrombotics/antiplatelets: Secondary | ICD-10-CM | POA: Diagnosis not present

## 2014-12-03 DIAGNOSIS — Z8546 Personal history of malignant neoplasm of prostate: Secondary | ICD-10-CM | POA: Insufficient documentation

## 2014-12-03 DIAGNOSIS — C61 Malignant neoplasm of prostate: Secondary | ICD-10-CM | POA: Diagnosis not present

## 2014-12-03 DIAGNOSIS — I2510 Atherosclerotic heart disease of native coronary artery without angina pectoris: Secondary | ICD-10-CM | POA: Insufficient documentation

## 2014-12-03 DIAGNOSIS — C7951 Secondary malignant neoplasm of bone: Secondary | ICD-10-CM | POA: Diagnosis not present

## 2014-12-03 DIAGNOSIS — Z87891 Personal history of nicotine dependence: Secondary | ICD-10-CM | POA: Insufficient documentation

## 2014-12-03 DIAGNOSIS — S0990XA Unspecified injury of head, initial encounter: Secondary | ICD-10-CM | POA: Diagnosis not present

## 2014-12-03 DIAGNOSIS — W19XXXA Unspecified fall, initial encounter: Secondary | ICD-10-CM

## 2014-12-03 DIAGNOSIS — R400 Somnolence: Secondary | ICD-10-CM | POA: Diagnosis not present

## 2014-12-03 LAB — CBC WITH DIFFERENTIAL/PLATELET
Basophils Absolute: 0.1 10*3/uL (ref 0.0–0.1)
Basophils Relative: 1 % (ref 0–1)
EOS PCT: 4 % (ref 0–5)
Eosinophils Absolute: 0.5 10*3/uL (ref 0.0–0.7)
HEMATOCRIT: 29.7 % — AB (ref 39.0–52.0)
Hemoglobin: 10 g/dL — ABNORMAL LOW (ref 13.0–17.0)
LYMPHS PCT: 11 % — AB (ref 12–46)
Lymphs Abs: 1.3 10*3/uL (ref 0.7–4.0)
MCH: 41.3 pg — ABNORMAL HIGH (ref 26.0–34.0)
MCHC: 33.7 g/dL (ref 30.0–36.0)
MCV: 122.7 fL — AB (ref 78.0–100.0)
MONO ABS: 0.7 10*3/uL (ref 0.1–1.0)
MONOS PCT: 6 % (ref 3–12)
Neutro Abs: 9.4 10*3/uL — ABNORMAL HIGH (ref 1.7–7.7)
Neutrophils Relative %: 78 % — ABNORMAL HIGH (ref 43–77)
PLATELETS: 264 10*3/uL (ref 150–400)
RBC: 2.42 MIL/uL — ABNORMAL LOW (ref 4.22–5.81)
RDW: 18.3 % — ABNORMAL HIGH (ref 11.5–15.5)
WBC: 12 10*3/uL — ABNORMAL HIGH (ref 4.0–10.5)

## 2014-12-03 LAB — AMMONIA: AMMONIA: 49 umol/L — AB (ref 11–32)

## 2014-12-03 LAB — COMPREHENSIVE METABOLIC PANEL
ALK PHOS: 151 U/L — AB (ref 39–117)
ALT: 60 U/L — AB (ref 0–53)
AST: 136 U/L — AB (ref 0–37)
Albumin: 2.5 g/dL — ABNORMAL LOW (ref 3.5–5.2)
Anion gap: 5 (ref 5–15)
BILIRUBIN TOTAL: 9.6 mg/dL — AB (ref 0.3–1.2)
BUN: 12 mg/dL (ref 6–23)
CHLORIDE: 107 mmol/L (ref 96–112)
CO2: 21 mmol/L (ref 19–32)
Calcium: 7.6 mg/dL — ABNORMAL LOW (ref 8.4–10.5)
Creatinine, Ser: 1.01 mg/dL (ref 0.50–1.35)
GFR calc Af Amer: 87 mL/min — ABNORMAL LOW (ref >90)
GFR calc non Af Amer: 75 mL/min — ABNORMAL LOW (ref >90)
Glucose, Bld: 86 mg/dL (ref 70–99)
Potassium: 4.1 mmol/L (ref 3.5–5.1)
SODIUM: 133 mmol/L — AB (ref 135–145)
Total Protein: 7.5 g/dL (ref 6.0–8.3)

## 2014-12-03 LAB — APTT: aPTT: 43 seconds — ABNORMAL HIGH (ref 24–37)

## 2014-12-03 LAB — PROTIME-INR
INR: 1.53 — ABNORMAL HIGH (ref 0.00–1.49)
Prothrombin Time: 18.5 seconds — ABNORMAL HIGH (ref 11.6–15.2)

## 2014-12-03 MED ORDER — VITAMIN B-1 100 MG PO TABS
100.0000 mg | ORAL_TABLET | Freq: Every day | ORAL | Status: DC
  Administered 2014-12-04 – 2014-12-06 (×3): 100 mg via ORAL
  Filled 2014-12-03 (×3): qty 1

## 2014-12-03 MED ORDER — LORAZEPAM 2 MG/ML IJ SOLN: 1.0000 mg | Freq: Four times a day (QID) | INTRAMUSCULAR | Status: DC | PRN

## 2014-12-03 MED ORDER — THIAMINE HCL 100 MG/ML IJ SOLN
100.0000 mg | Freq: Every day | INTRAMUSCULAR | Status: DC
  Filled 2014-12-03 (×3): qty 1

## 2014-12-03 MED ORDER — SODIUM CHLORIDE 0.9 % IV SOLN
INTRAVENOUS | Status: DC
  Administered 2014-12-03 – 2014-12-04 (×2): via INTRAVENOUS

## 2014-12-03 MED ORDER — SODIUM CHLORIDE 0.9 % IJ SOLN
3.0000 mL | Freq: Two times a day (BID) | INTRAMUSCULAR | Status: DC
  Administered 2014-12-04 – 2014-12-05 (×2): 3 mL via INTRAVENOUS

## 2014-12-03 MED ORDER — SODIUM CHLORIDE 0.9 % IV BOLUS (SEPSIS)
500.0000 mL | Freq: Once | INTRAVENOUS | Status: AC
  Administered 2014-12-03: 500 mL via INTRAVENOUS

## 2014-12-03 MED ORDER — CHLORDIAZEPOXIDE HCL 25 MG PO CAPS
25.0000 mg | ORAL_CAPSULE | Freq: Three times a day (TID) | ORAL | Status: DC
  Administered 2014-12-04 – 2014-12-06 (×8): 25 mg via ORAL
  Filled 2014-12-03 (×8): qty 1

## 2014-12-03 MED ORDER — MORPHINE SULFATE 2 MG/ML IJ SOLN
1.0000 mg | INTRAMUSCULAR | Status: DC | PRN
  Administered 2014-12-04: 2 mg via INTRAVENOUS
  Administered 2014-12-05: 1 mg via INTRAVENOUS
  Filled 2014-12-03 (×2): qty 1

## 2014-12-03 MED ORDER — HEPARIN SODIUM (PORCINE) 5000 UNIT/ML IJ SOLN
5000.0000 [IU] | Freq: Three times a day (TID) | INTRAMUSCULAR | Status: DC
  Administered 2014-12-03 – 2014-12-06 (×8): 5000 [IU] via SUBCUTANEOUS
  Filled 2014-12-03 (×11): qty 1

## 2014-12-03 MED ORDER — THIAMINE HCL 100 MG/ML IJ SOLN
Freq: Once | INTRAVENOUS | Status: AC
  Administered 2014-12-03: via INTRAVENOUS
  Filled 2014-12-03: qty 1000

## 2014-12-03 MED ORDER — LACTULOSE 10 GM/15ML PO SOLN
10.0000 g | Freq: Two times a day (BID) | ORAL | Status: DC
  Administered 2014-12-03 – 2014-12-06 (×6): 10 g via ORAL
  Filled 2014-12-03 (×7): qty 15

## 2014-12-03 MED ORDER — HYDROCODONE-ACETAMINOPHEN 5-325 MG PO TABS
1.0000 | ORAL_TABLET | Freq: Once | ORAL | Status: AC
  Administered 2014-12-03: 1 via ORAL
  Filled 2014-12-03: qty 1

## 2014-12-03 MED ORDER — FOLIC ACID 1 MG PO TABS
1.0000 mg | ORAL_TABLET | Freq: Every day | ORAL | Status: DC
  Administered 2014-12-04 – 2014-12-06 (×3): 1 mg via ORAL
  Filled 2014-12-03 (×3): qty 1

## 2014-12-03 MED ORDER — ADULT MULTIVITAMIN W/MINERALS CH
1.0000 | ORAL_TABLET | Freq: Every day | ORAL | Status: DC
  Administered 2014-12-04 – 2014-12-06 (×3): 1 via ORAL
  Filled 2014-12-03 (×3): qty 1

## 2014-12-03 MED ORDER — CLOPIDOGREL BISULFATE 75 MG PO TABS
75.0000 mg | ORAL_TABLET | Freq: Every day | ORAL | Status: DC
  Administered 2014-12-04 – 2014-12-06 (×3): 75 mg via ORAL
  Filled 2014-12-03 (×3): qty 1

## 2014-12-03 MED ORDER — LORAZEPAM 1 MG PO TABS: 1.0000 mg | ORAL_TABLET | Freq: Four times a day (QID) | ORAL | Status: DC | PRN

## 2014-12-03 MED ORDER — TEMAZEPAM 7.5 MG PO CAPS
7.5000 mg | ORAL_CAPSULE | Freq: Every evening | ORAL | Status: DC | PRN
  Administered 2014-12-04: 7.5 mg via ORAL
  Filled 2014-12-03: qty 1

## 2014-12-03 MED ORDER — SPIRONOLACTONE 25 MG PO TABS
25.0000 mg | ORAL_TABLET | Freq: Every day | ORAL | Status: DC
  Administered 2014-12-04 – 2014-12-06 (×3): 25 mg via ORAL
  Filled 2014-12-03 (×3): qty 1

## 2014-12-03 NOTE — ED Notes (Signed)
Patient cleaned from bowel movement

## 2014-12-03 NOTE — ED Notes (Signed)
Nurse currently starting IV 

## 2014-12-03 NOTE — H&P (Addendum)
Hospitalist Admission History and Physical  Patient name: Allen Schroeder Medical record number: [id number] Date of birth: Mar 31, 1951 Age: 64 y.o. Gender: male  Primary Care Provider: Silvano Rusk, MD  Chief Complaint: fall, rib fractures, hypotension  History of Present Illness:This is a 64 y.o. year old male with significant past medical history of multiple medical problems including metastatic prostate cancer, alcoholic hepatitis w/ ascites, CAD, HTN, atrial fibrillation, jaundice, portal hypertension, hepatic encephalopathy presenting with fall, rib fractures, hypotension. Patient noted to have been admitted April 13 through April 19 for alcoholic hepatitis/transaminitis among other problems. Please see discharge summary for full details. Patient is noted to have been discharged to [facility] for short-term rehabilitation. Family reports the patient has had recurrent episodes of falls while at [facility]. Family feels patient was not getting adequate care at the [facility]. Patient reports persistent right-sided pain. Per the family, x-rays were obtained at the center that were negative for fracture. Also with decreased by mouth intake per report. Presented to the ER afebrile, heart rate in the 60s, respirations in the tens, blood pressure in the 80s to 90s. Improved to the 100s after 1 L normal saline bolus. Initially satting in the upper 80s on room air, now in upper 90s on 2 L. Notable labs white blood cell count 12, hemoglobin 10, creatinine 1.01. INR 1.53. Platelets 260. Head and C-spine CT within normal limits. Right-sided rib and chest films show 10th, 11th, 12th rib fractures. 10th and 11th rib fractures mildly displaced. Right lower lobe atelectasis with no evidence of pneumonia. Family is currently having discussions about setting up constant home care however has many questions about this. Does not seem to be agreeable to [facility] placement at present.   Assessment and Plan:  Active  Problems:   Rib fracture   Hypotension   1- Fall  -Recurrent in setting of multiple comorbidities -fall precautions -PT/OT consult   2-Rib Fractures -pain control  -I/S -noted transient hypoxia on admission-likely secondary to splinting-concominant mild leukocytosis in setting of rib fracture -defer PNA coverage for now-LOW THRESHOLD for HCAP coverage if pt spikes fever, hypoxia worsens -consider repeat CXR once pt hydrates  3-Hypotension -noted SBPs in 90s on presentation-improved w/ IVFs -suspect mild dehydration -hold diuretics and BB overnight-retitrate slowly  -gently hydrate -follow   4-Alcoholic Hepatitis/End stage liver disease  -MELD Score 22 -LFTs/Tbili at or below levels from hospital discharge -follow -CIWA  -cont librium  5-Metatstatic prostate cancer -noted bony mets  -holding medication 2/2 LFTs -onc c/s as clinically indicated   6-Atrial Fibrillation -not on anticoagulation likely 2/2 recurrent falls -cont plavix -hold BB in the interim given low BP (has taken meds for the day) -titrate over night -tele bed  7-Hepatic Encephalopathy -mildly elevated ammonia -mentation appropriate at present -cont lactulose   FEN/GI: heart healthy diet. PPI Prophylaxis: sub q heparin  Disposition: pending further evaluation. Family desiring possible home care. Has several questions. Unclear if family is fully aware of extent of severity of comorbidities. Will need further discussion w/ family in terms of long term care plans as well as goals of care.  Code Status:FUll Code    Patient Active Problem List   Diagnosis Date Noted  . Alcohol abuse   . Portal hypertension   . Jaundice   . Thrombocytopenia   . Generalized anxiety disorder   . Alcohol withdrawal   . Alcoholism 11/18/2014  . Ascites 11/18/2014  . Edema 11/18/2014  . Abdominal pain 11/18/2014  . Alcoholic hepatitis with  ascites 11/13/2014  . Bisphosphonate-associated osteonecrosis of the jaw  04/28/2013  . Prostate cancer, primary, with metastasis from prostate to other site 02/13/2012  . History of placement of stent in LAD coronary artery 02/13/2012  . Alcohol abuse with physiological dependence 02/13/2012  . CAD, NATIVE VESSEL 04/26/2010  . HYPERLIPIDEMIA 01/22/2009  . ATRIAL FIBRILLATION 12/29/2008  . ESSENTIAL HYPERTENSION 02/10/2008   Past Medical History: Past Medical History  Diagnosis Date  . Atrial fibrillation   . Other and unspecified hyperlipidemia   . Unspecified essential hypertension   . Malignant neoplasm of prostate 11/2007    Mets to Bone  . Elevated prostate specific antigen (PSA)   . Personal history of colonic polyps   . Hypocalcemia   . Acute alcoholic hepatitis   . Essential and other specified forms of tremor   . Anxiety state, unspecified   . Family history of malignant neoplasm of gastrointestinal tract   . Alcohol abuse with physiological dependence 02/13/2012  . 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 Alcoholic hepatitis with ascites 11/13/2014    Past Surgical History: Past Surgical History  Procedure Laterality Date  . Vasectomy    . Cholecystectomy    . Coronary stent placement  2011  . Colonoscopy w/ biopsies      Social History: History   Social History  . Marital Status: Married    Spouse Name: N/A  . Number of Children: 3  . Years of Education: N/A   Occupational History  . lawyer     VP of [employer] History Main Topics  . Smoking status: Former Smoker    Types: Cigarettes  . Smokeless tobacco: Never Used     Comment: stopped in college  . Alcohol Use: Yes     Comment: has consumed as much as a 1/2 gallon of vodka in a day, now drinks at least 3-5 drinks per day.   . Drug Use: No  . Sexual Activity: No   Other Topics Concern  . None   Social History Narrative   Patient is married. 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   11/18/2014       Family History: Family History  Problem Relation Age of Onset  . Prostate cancer Father   . Coronary artery disease Father   . Colon cancer Father   . Aneurysm Mother     Allergies: No Known Allergies  Current Facility-Administered Medications  Medication Dose Route Frequency Provider Last Rate Last Dose  . chlordiazePOXIDE (LIBRIUM) capsule 25 mg  25 mg Oral TID Deneise Lever, MD      . Derrill Memo ON 12/04/2014] clopidogrel (PLAVIX) tablet 75 mg  75 mg Oral Daily Deneise Lever, MD      . HYDROcodone-acetaminophen (NORCO/VICODIN) 5-325 MG per tablet 1 tablet  1 tablet Oral Once Blanchie Dessert, MD      . lactulose (CHRONULAC) 10 GM/15ML solution 10 g  10 g Oral BID Deneise Lever, MD      . Derrill Memo ON 12/04/2014] spironolactone (ALDACTONE) tablet 25 mg  25 mg Oral Daily Deneise Lever, MD      . temazepam (RESTORIL) capsule 7.5 mg  7.5 mg Oral QHS PRN Deneise Lever, MD       Current Outpatient Prescriptions  Medication Sig Dispense Refill  . CALCIUM CARB-CHOLECALCIFEROL PO Take 1  capsule by mouth daily.     . chlordiazePOXIDE (LIBRIUM) 25 MG capsule Take 1 capsule (25 mg total) by mouth 3 (three) times daily. 90 capsule 0  . clopidogrel (PLAVIX) 75 MG tablet TAKE ONE TABLET BY MOUTH ONCE DAILY (Patient taking differently: Take 75 mg by mouth daily. ) 30 tablet 11  . folic acid (FOLVITE) 1 MG tablet Take one tablet by mouth once a day.... (Patient taking differently: Take 1 mg by mouth daily. ) 30 tablet 6  . furosemide (LASIX) 20 MG tablet Take 0.5 tablets (10 mg total) by mouth daily. 30 tablet 1  . lactulose (CHRONULAC) 10 GM/15ML solution Take 15 mLs (10 g total) by mouth 2 (two) times daily. 240 mL 0  . metoprolol succinate (TOPROL-XL) 25 MG 24 hr tablet Take 0.5 tablets (12.5 mg total) by mouth daily. 30 tablet 1  . Multiple Vitamin (MULTIVITAMIN) tablet Take 1 tablet by mouth daily.      .  potassium chloride SA (K-DUR,KLOR-CON) 20 MEQ tablet Take 1 tablet (20 mEq total) by mouth 2 (two) times daily. 30 tablet 1  . sotalol (BETAPACE) 80 MG tablet Take 1 tablet (80 mg total) by mouth daily. T 30 tablet 1  . spironolactone (ALDACTONE) 25 MG tablet Take 1 tablet (25 mg total) by mouth daily. 30 tablet 1  . temazepam (RESTORIL) 7.5 MG capsule Take 1 capsule (7.5 mg total) by mouth at bedtime as needed for sleep (give first dose tonight). 30 capsule 0  . traMADol (ULTRAM) 50 MG tablet Take 25 mg by mouth 2 (two) times daily as needed for moderate pain or severe pain.    [name] UNKNOWN TO PATIENT Slow release hormone implant placed 8/13 by Dr. Willette Cluster.  Good for one year    . diphenhydrAMINE (BENADRYL) 25 MG tablet Take 25 mg by mouth every 6 (six) hours as needed for allergies or sleep.      Review Of Systems: 12 point ROS negative except as noted above in HPI.  Physical Exam: Filed Vitals:   12/03/14 2200  BP: 107/58  Pulse: 69  Temp:   Resp:     General: cooperative, cachectic and jaundiced HEENT: extra ocular movement intact and + scleral icterus Heart: S1, S2 normal, no murmur, rub or gallop, regular rate and rhythm Lungs: clear to auscultation, no wheezes or rales and unlabored breathing Abdomen: + mild abd distension, non tender, + bowel sounds  Extremities: extremities normal, atraumatic, no cyanosis or edema Skin:no rashes Neurology: + mild generalized weakness, otherwise grossly normal exam  Labs and Imaging: Lab Results  Component Value Date/Time   NA 133* 12/03/2014 06:41 PM   NA 131* 11/10/2014 03:32 PM   K 4.1 12/03/2014 06:41 PM   K 3.3* 11/10/2014 03:32 PM   CL 107 12/03/2014 06:41 PM   CL 100 12/10/2012 03:40 PM   CO2 21 12/03/2014 06:41 PM   CO2 23 11/10/2014 03:32 PM   BUN 12 12/03/2014 06:41 PM   BUN 5.6* 11/10/2014 03:32 PM   CREATININE 1.01 12/03/2014 06:41 PM   CREATININE 0.8 11/10/2014 03:32 PM   GLUCOSE 86 12/03/2014 06:41 PM   GLUCOSE 90  11/10/2014 03:32 PM   GLUCOSE 109* 12/10/2012 03:40 PM   Lab Results  Component Value Date   WBC 12.0* 12/03/2014   HGB 10.0* 12/03/2014   HCT 29.7* 12/03/2014   MCV 122.7* 12/03/2014   PLT 264 12/03/2014    Dg Ribs Unilateral W/chest Right  12/03/2014   CLINICAL DATA:  Fell at the nursing home wall transferring from bed to wheelchair, striking the back of the head. Patient has fallen 3-4 times in the past week. Mid to lower right chest/rib pain. Initial encounter.  EXAM: RIGHT RIBS AND CHEST - 3+ VIEW  COMPARISON:  No prior rib imaging. Chest x-ray 11/22/2014 and earlier. Whole-body bone scan 08/14/2012, 02/19/2012.  FINDINGS: Comminuted, mildly displaced fractures involving the right posterior tenth and eleventh ribs. Nondisplaced fracture involving the posterolateral eleventh rib.  Atelectasis involving the right lower lobe, superimposed on chronic elevation of the right hemidiaphragm. Lungs otherwise clear. No localized airspace consolidation. No pleural effusions. No pneumothorax. Normal pulmonary vascularity. Cardiac silhouette mildly enlarged for AP technique, unchanged.  IMPRESSION: 1. Acute traumatic fractures involving the right posterior tenth, eleventh and twelfth ribs. The tenth and eleventh rib fractures are mildly displaced, the 12 rib fracture is nondisplaced. 2. Right lower lobe atelectasis. No acute cardiopulmonary disease otherwise. Stable mild cardiomegaly.   Electronically Signed   By: Evangeline Dakin M.D.   On: 12/03/2014 19:16   Ct Head Wo Contrast  12/03/2014   CLINICAL DATA:  Fall transferring from bed to wheelchair. Patient struck back of head on bed radially. History of alcoholism. Prior history of prostate cancer.  EXAM: CT HEAD WITHOUT CONTRAST  CT CERVICAL SPINE WITHOUT CONTRAST  TECHNIQUE: Multidetector CT imaging of the head and cervical spine was performed following the standard protocol without intravenous contrast. Multiplanar CT image reconstructions of the  cervical spine were also generated.  COMPARISON:  Head CT and neck CT 11/30/2007  FINDINGS: CT HEAD FINDINGS  No intracranial hemorrhage. No parenchymal contusion. No midline shift or mass effect. Basilar cisterns are patent. No skull base fracture. No fluid in the paranasal sinuses or mastoid air cells. Orbits are normal. There is generalized cortical atrophy and proportional ventricular dilatation. Mild periventricular white matter hypodensities.  CT CERVICAL SPINE FINDINGS  No prevertebral soft tissue swelling. Normal alignment of cervical vertebral bodies. No loss of vertebral body height. Normal facet articulation. Normal craniocervical junction.  No evidence epidural or paraspinal hematoma.  There is multiple levels of endplate spurring most severe from C5-C7. There is joint space narrowing at C6-C7.  There is a new sclerotic lesion within the pars interarticularis of the C5 vertebral body on the left. There is a rounded lesion within the T1 vertebral body which is new also (image 80, series 6) and measures 10 mm.  IMPRESSION: 1. No acute intracranial findings. 2. Cortical atrophy and ventricular dilatation similar prior. 3. No cervical spine fracture. 4. Sclerotic skeletal metastasis at C5 and T1 are new from CT of 11/30/2007.   Electronically Signed   By: Suzy Bouchard M.D.   On: 12/03/2014 21:03   Ct Cervical Spine Wo Contrast  12/03/2014   CLINICAL DATA:  Fall transferring from bed to wheelchair. Patient struck back of head on bed radially. History of alcoholism. Prior history of prostate cancer.  EXAM: CT HEAD WITHOUT CONTRAST  CT CERVICAL SPINE WITHOUT CONTRAST  TECHNIQUE: Multidetector CT imaging of the head and cervical spine was performed following the standard protocol without intravenous contrast. Multiplanar CT image reconstructions of the cervical spine were also generated.  COMPARISON:  Head CT and neck CT 11/30/2007  FINDINGS: CT HEAD FINDINGS  No intracranial hemorrhage. No parenchymal  contusion. No midline shift or mass effect. Basilar cisterns are patent. No skull base fracture. No fluid in the paranasal sinuses or mastoid air cells. Orbits are normal. There is generalized cortical atrophy and proportional  ventricular dilatation. Mild periventricular white matter hypodensities.  CT CERVICAL SPINE FINDINGS  No prevertebral soft tissue swelling. Normal alignment of cervical vertebral bodies. No loss of vertebral body height. Normal facet articulation. Normal craniocervical junction.  No evidence epidural or paraspinal hematoma.  There is multiple levels of endplate spurring most severe from C5-C7. There is joint space narrowing at C6-C7.  There is a new sclerotic lesion within the pars interarticularis of the C5 vertebral body on the left. There is a rounded lesion within the T1 vertebral body which is new also (image 80, series 6) and measures 10 mm.  IMPRESSION: 1. No acute intracranial findings. 2. Cortical atrophy and ventricular dilatation similar prior. 3. No cervical spine fracture. 4. Sclerotic skeletal metastasis at C5 and T1 are new from CT of 11/30/2007.   Electronically Signed   By: Suzy Bouchard M.D.   On: 12/03/2014 21:03           Shanda Howells MD  Pager: [phone]

## 2014-12-03 NOTE — Progress Notes (Signed)
CSW attempted to speak with pt at bedside. However, the pt was not present at time. Per note, patient is from [facility] and presents to [facility] due to falling while attempting to transfer himself from his bed to wheelchair.  CSW will check back in with pt later.  Willette Brace [phone] ED CSW 12/03/2014 6:50 PM

## 2014-12-03 NOTE — ED Notes (Signed)
Patient to radiology.

## 2014-12-03 NOTE — ED Notes (Signed)
Bed: [facility] Expected date:  Expected time:  Means of arrival:  Comments: EMS- fall, head injury

## 2014-12-03 NOTE — ED Provider Notes (Signed)
CSN: [id number]     Arrival date & time 12/03/14  1719 History   First MD Initiated Contact with Patient 12/03/14 1748     Chief Complaint  Patient presents with  . Fall  . Head Injury     (Consider location/radiation/quality/duration/timing/severity/associated sxs/prior Treatment) HPI Comments: Patient presents to the emergency room via EMS from a rehabilitation facility. He is being brought in for recurrent falls. Patient has had 3 falls in the last 1 week. The first fall was related to him using his walker and getting tangled curtained causing him to fall onto an air conditioning unit on his right side. The patient's second fall was when he was coming out of the bathroom and he lost his balance falling backwards and his third fall was he slid out of the bed onto the floor. Patient is unclear when he hit his head but he does have an injury to the back of his head. Family states that he has been slightly confused but feel that his jaundice is improving. Patient was recently discharged from the hospital on 11/24/2014 after hospitalization for acute alcoholic hepatitis, ascites and had to be taken off of medication for his metastatic prostate cancer. They have been giving him pain medication and Librium for alcohol withdrawal which family thinks contributes to some of his confusion.  Patient is a 64 y.o. male presenting with fall and head injury. The history is provided by the spouse and the patient.  Fall This is a recurrent problem. Episode onset: 3 falls in the last week.   Head Injury   Past Medical History  Diagnosis Date  . Atrial fibrillation   . Other and unspecified hyperlipidemia   . Unspecified essential hypertension   . Malignant neoplasm of prostate 11/2007    Mets to Bone  . Elevated prostate specific antigen (PSA)   . Personal history of colonic polyps   . Hypocalcemia   . Acute alcoholic hepatitis   . Essential and other specified forms of tremor   . Anxiety state,  unspecified   . Family history of malignant neoplasm of gastrointestinal tract   . Alcohol abuse with physiological dependence 02/13/2012  . 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 Alcoholic hepatitis with ascites 11/13/2014   Past Surgical History  Procedure Laterality Date  . Vasectomy    . Cholecystectomy    . Coronary stent placement  2011  . Colonoscopy w/ biopsies     Family History  Problem Relation Age of Onset  . Prostate cancer Father   . Coronary artery disease Father   . Colon cancer Father   . Aneurysm Mother    History  Substance Use Topics  . Smoking status: Former Smoker    Types: Cigarettes  . Smokeless tobacco: Never Used     Comment: stopped in college  . Alcohol Use: Yes     Comment: has consumed as much as a 1/2 gallon of vodka in a day, now drinks at least 3-5 drinks per day.     Review of Systems  All other systems reviewed and are negative.     Allergies  Review of patient's allergies indicates no known allergies.  Home Medications   Prior to Admission medications   Medication Sig Start Date End Date Taking? Authorizing Provider  CALCIUM CARB-CHOLECALCIFEROL PO Take 1 capsule by mouth daily.     Historical Provider, MD  chlordiazePOXIDE (LIBRIUM) 25 MG  capsule Take 1 capsule (25 mg total) by mouth 3 (three) times daily. 11/24/14   Theodis Blaze, MD  clopidogrel (PLAVIX) 75 MG tablet TAKE ONE TABLET BY MOUTH ONCE DAILY Patient taking differently: Take 75 mg by mouth once.  06/22/14   Sherren Mocha, MD  diphenhydrAMINE (BENADRYL) 25 MG tablet Take 25 mg by mouth every 6 (six) hours as needed for allergies or sleep.     Historical Provider, MD  folic acid (FOLVITE) 1 MG tablet Take one tablet by mouth once a day.... Patient taking differently: Take 1 mg by mouth daily.  06/22/14   Sherren Mocha, MD  furosemide (LASIX) 20 MG tablet Take 0.5 tablets (10 mg total)  by mouth daily. 11/24/14   Theodis Blaze, MD  lactulose (CHRONULAC) 10 GM/15ML solution Take 15 mLs (10 g total) by mouth 2 (two) times daily. 11/24/14   Theodis Blaze, MD  metoprolol succinate (TOPROL-XL) 25 MG 24 hr tablet Take 0.5 tablets (12.5 mg total) by mouth daily. 11/24/14   Theodis Blaze, MD  Multiple Vitamin (MULTIVITAMIN) tablet Take 1 tablet by mouth daily.      Historical Provider, MD  potassium chloride SA (K-DUR,KLOR-CON) 20 MEQ tablet Take 1 tablet (20 mEq total) by mouth 2 (two) times daily. 11/13/14   Gatha Mayer, MD  sotalol (BETAPACE) 80 MG tablet Take 1 tablet (80 mg total) by mouth daily. T 11/24/14   Theodis Blaze, MD  spironolactone (ALDACTONE) 25 MG tablet Take 1 tablet (25 mg total) by mouth daily. 11/24/14   Theodis Blaze, MD  temazepam (RESTORIL) 7.5 MG capsule Take 1 capsule (7.5 mg total) by mouth at bedtime as needed for sleep (give first dose tonight). 11/24/14   Theodis Blaze, MD  UNKNOWN TO PATIENT Slow release hormone implant placed 8/13 by Dr. Willette Cluster.  Good for one year    Historical Provider, MD   BP 87/42 mmHg  Pulse 64  Temp(Src) 98.1 F (36.7 C) (Oral)  Resp 18  SpO2 90% Physical Exam  Constitutional: He is oriented to person, place, and time. He appears well-developed and well-nourished. No distress.  HENT:  Head: Normocephalic. Head is with contusion.    Mouth/Throat: Oropharynx is clear and moist.  Eyes: Conjunctivae and EOM are normal. Pupils are equal, round, and reactive to light. Scleral icterus is present.  Neck: Normal range of motion. Neck supple. No spinous process tenderness and no muscular tenderness present.  Cardiovascular: Normal rate, regular rhythm and intact distal pulses.   No murmur heard. Pulmonary/Chest: Effort normal and breath sounds normal. No respiratory distress. He has no wheezes. He has no rales. He exhibits tenderness. He exhibits no crepitus.    Abdominal: Soft. He exhibits no distension. There is no tenderness.  There is no rebound and no guarding.    Musculoskeletal: Normal range of motion. He exhibits no edema or tenderness.  Neurological: He is alert and oriented to person, place, and time.  Skin: Skin is warm and dry. No rash noted. No erythema.  Jaundiced  Psychiatric: He has a normal mood and affect. His behavior is normal.  Nursing note and vitals reviewed.   ED Course  Procedures (including critical care time) Labs Review Labs Reviewed  CBC WITH DIFFERENTIAL/PLATELET - Abnormal; Notable for the following:    WBC 12.0 (*)    RBC 2.42 (*)    Hemoglobin 10.0 (*)    HCT 29.7 (*)    MCV 122.7 (*)    [facility]  41.3 (*)    RDW 18.3 (*)    Neutrophils Relative % 78 (*)    Lymphocytes Relative 11 (*)    Neutro Abs 9.4 (*)    All other components within normal limits  COMPREHENSIVE METABOLIC PANEL - Abnormal; Notable for the following:    Sodium 133 (*)    Calcium 7.6 (*)    Albumin 2.5 (*)    AST 136 (*)    ALT 60 (*)    Alkaline Phosphatase 151 (*)    Total Bilirubin 9.6 (*)    GFR calc non Af Amer 75 (*)    GFR calc Af Amer 87 (*)    All other components within normal limits  PROTIME-INR - Abnormal; Notable for the following:    Prothrombin Time 18.5 (*)    INR 1.53 (*)    All other components within normal limits  APTT - Abnormal; Notable for the following:    aPTT 43 (*)    All other components within normal limits  AMMONIA - Abnormal; Notable for the following:    Ammonia 49 (*)    All other components within normal limits    Imaging Review Dg Ribs Unilateral W/chest Right  12/03/2014   CLINICAL DATA:  [place] at the nursing home wall transferring from bed to wheelchair, striking the back of the head. Patient has fallen 3-4 times in the past week. Mid to lower right chest/rib pain. Initial encounter.  EXAM: RIGHT RIBS AND CHEST - 3+ VIEW  COMPARISON:  No prior rib imaging. Chest x-ray 11/22/2014 and earlier. Whole-body bone scan 08/14/2012, 02/19/2012.  FINDINGS: Comminuted,  mildly displaced fractures involving the right posterior tenth and eleventh ribs. Nondisplaced fracture involving the posterolateral eleventh rib.  Atelectasis involving the right lower lobe, superimposed on chronic elevation of the right hemidiaphragm. Lungs otherwise clear. No localized airspace consolidation. No pleural effusions. No pneumothorax. Normal pulmonary vascularity. Cardiac silhouette mildly enlarged for AP technique, unchanged.  IMPRESSION: 1. Acute traumatic fractures involving the right posterior tenth, eleventh and twelfth ribs. The tenth and eleventh rib fractures are mildly displaced, the 12 rib fracture is nondisplaced. 2. Right lower lobe atelectasis. No acute cardiopulmonary disease otherwise. Stable mild cardiomegaly.   Electronically Signed   By: Evangeline Dakin M.D.   On: 12/03/2014 19:16   Ct Head Wo Contrast  12/03/2014   CLINICAL DATA:  Fall transferring from bed to wheelchair. Patient struck back of head on bed radially. History of alcoholism. Prior history of prostate cancer.  EXAM: CT HEAD WITHOUT CONTRAST  CT CERVICAL SPINE WITHOUT CONTRAST  TECHNIQUE: Multidetector CT imaging of the head and cervical spine was performed following the standard protocol without intravenous contrast. Multiplanar CT image reconstructions of the cervical spine were also generated.  COMPARISON:  Head CT and neck CT 11/30/2007  FINDINGS: CT HEAD FINDINGS  No intracranial hemorrhage. No parenchymal contusion. No midline shift or mass effect. Basilar cisterns are patent. No skull base fracture. No fluid in the paranasal sinuses or mastoid air cells. Orbits are normal. There is generalized cortical atrophy and proportional ventricular dilatation. Mild periventricular white matter hypodensities.  CT CERVICAL SPINE FINDINGS  No prevertebral soft tissue swelling. Normal alignment of cervical vertebral bodies. No loss of vertebral body height. Normal facet articulation. Normal craniocervical junction.  No  evidence epidural or paraspinal hematoma.  There is multiple levels of endplate spurring most severe from C5-C7. There is joint space narrowing at C6-C7.  There is a new sclerotic lesion within the pars interarticularis  of the C5 vertebral body on the left. There is a rounded lesion within the T1 vertebral body which is new also (image 80, series 6) and measures 10 mm.  IMPRESSION: 1. No acute intracranial findings. 2. Cortical atrophy and ventricular dilatation similar prior. 3. No cervical spine fracture. 4. Sclerotic skeletal metastasis at C5 and T1 are new from CT of 11/30/2007.   Electronically Signed   By: Suzy Bouchard M.D.   On: 12/03/2014 21:03   Ct Cervical Spine Wo Contrast  12/03/2014   CLINICAL DATA:  Fall transferring from bed to wheelchair. Patient struck back of head on bed radially. History of alcoholism. Prior history of prostate cancer.  EXAM: CT HEAD WITHOUT CONTRAST  CT CERVICAL SPINE WITHOUT CONTRAST  TECHNIQUE: Multidetector CT imaging of the head and cervical spine was performed following the standard protocol without intravenous contrast. Multiplanar CT image reconstructions of the cervical spine were also generated.  COMPARISON:  Head CT and neck CT 11/30/2007  FINDINGS: CT HEAD FINDINGS  No intracranial hemorrhage. No parenchymal contusion. No midline shift or mass effect. Basilar cisterns are patent. No skull base fracture. No fluid in the paranasal sinuses or mastoid air cells. Orbits are normal. There is generalized cortical atrophy and proportional ventricular dilatation. Mild periventricular white matter hypodensities.  CT CERVICAL SPINE FINDINGS  No prevertebral soft tissue swelling. Normal alignment of cervical vertebral bodies. No loss of vertebral body height. Normal facet articulation. Normal craniocervical junction.  No evidence epidural or paraspinal hematoma.  There is multiple levels of endplate spurring most severe from C5-C7. There is joint space narrowing at C6-C7.   There is a new sclerotic lesion within the pars interarticularis of the C5 vertebral body on the left. There is a rounded lesion within the T1 vertebral body which is new also (image 80, series 6) and measures 10 mm.  IMPRESSION: 1. No acute intracranial findings. 2. Cortical atrophy and ventricular dilatation similar prior. 3. No cervical spine fracture. 4. Sclerotic skeletal metastasis at C5 and T1 are new from CT of 11/30/2007.   Electronically Signed   By: Suzy Bouchard M.D.   On: 12/03/2014 21:03     EKG Interpretation None      MDM   Final diagnoses:  Fall  Hypoxia  Rib fractures, right, closed, initial encounter  Hypotension, unspecified hypotension type    Patient presents from a rehabilitation facility after multiple falls in the last 1 week the most recent fall was yesterday. During one of his falls he hit his side and an air conditioning unit and hit his head. Wife and son are present with the patient and states that he has had significant weakness and deconditioning since his hospitalization where he was found to have alcoholic hepatitis. Patient is awake and alert but family has noted some confusion. He is complaining of pain in the right ribs but denies headache. Patient has a large hematoma of the back portion of his head, jaundice, significant bruising over the right ribs without abdominal tenderness and normal function of his legs.  Here patient is hypotensive 87/42 with prior blood pressures in the 120s on prior hospitalization. Per wife patient has not been eating or drinking much.  He does take Plavix but takes no other blood thinning medications.  Part of his weakness is thought to be due to discontinuing a prostate cancer medication due to harming the liver.  CBC, CMP, INR, PTT, ammonia, head CT, C-spine, right rib films and chest x-ray pending. Patient will be  given 500 mL saline bolus.   10:13 PM Patient found to have right-sided rib fractures which would explain  his hypoxia and right-sided rib pain. CBC, CMP, INR, PTT, and ammonia are all improving from recent hospitalization. Hypotension improved to 100 over 50s after 1 L bolus. Findings discussed with family who are at bedside and they are concerned for patient to return to the facility. They're concerned that the facility is unable to care for him. He has fallen multiple times and having difficulty versus obtaining in physical therapy. Also now with requiring pain medication and being hypotensive and hypoxic concern that patient will need further IV fluids and possible blood pressure medication adjustment prior to discharge. Family also expressing a desire to take the patient directly home and avoid rehabilitation centers.  Blanchie Dessert, MD 12/03/14 2215

## 2014-12-03 NOTE — ED Notes (Addendum)
Per EMS, pt fell while transferring from his bed to his wheelchair, hitting the back of his head on a railing. Pt is from [facility]. Nursing home states the pt has fallen 3-4 times in the past week. Pt has hx of alcoholism, has recently quit drinking 8 days ago.

## 2014-12-04 ENCOUNTER — Encounter (HOSPITAL_COMMUNITY): Payer: Self-pay

## 2014-12-04 DIAGNOSIS — K7011 Alcoholic hepatitis with ascites: Secondary | ICD-10-CM | POA: Diagnosis not present

## 2014-12-04 DIAGNOSIS — S2231XA Fracture of one rib, right side, initial encounter for closed fracture: Secondary | ICD-10-CM

## 2014-12-04 DIAGNOSIS — S2241XA Multiple fractures of ribs, right side, initial encounter for closed fracture: Secondary | ICD-10-CM | POA: Diagnosis not present

## 2014-12-04 DIAGNOSIS — I959 Hypotension, unspecified: Secondary | ICD-10-CM | POA: Diagnosis not present

## 2014-12-04 DIAGNOSIS — S2231XK Fracture of one rib, right side, subsequent encounter for fracture with nonunion: Secondary | ICD-10-CM

## 2014-12-04 DIAGNOSIS — S2239XA Fracture of one rib, unspecified side, initial encounter for closed fracture: Secondary | ICD-10-CM | POA: Insufficient documentation

## 2014-12-04 DIAGNOSIS — S2249XA Multiple fractures of ribs, unspecified side, initial encounter for closed fracture: Secondary | ICD-10-CM | POA: Insufficient documentation

## 2014-12-04 DIAGNOSIS — I480 Paroxysmal atrial fibrillation: Secondary | ICD-10-CM

## 2014-12-04 LAB — CBC WITH DIFFERENTIAL/PLATELET
Basophils Absolute: 0.1 10*3/uL (ref 0.0–0.1)
Basophils Relative: 1 % (ref 0–1)
EOS ABS: 0.5 10*3/uL (ref 0.0–0.7)
Eosinophils Relative: 5 % (ref 0–5)
HCT: 29.3 % — ABNORMAL LOW (ref 39.0–52.0)
Hemoglobin: 9.6 g/dL — ABNORMAL LOW (ref 13.0–17.0)
LYMPHS PCT: 13 % (ref 12–46)
Lymphs Abs: 1.4 10*3/uL (ref 0.7–4.0)
MCH: 40.7 pg — ABNORMAL HIGH (ref 26.0–34.0)
MCHC: 32.8 g/dL (ref 30.0–36.0)
MCV: 124.2 fL — ABNORMAL HIGH (ref 78.0–100.0)
MONOS PCT: 7 % (ref 3–12)
Monocytes Absolute: 0.7 10*3/uL (ref 0.1–1.0)
Neutro Abs: 7.8 10*3/uL — ABNORMAL HIGH (ref 1.7–7.7)
Neutrophils Relative %: 74 % (ref 43–77)
Platelets: 224 10*3/uL (ref 150–400)
RBC: 2.36 MIL/uL — ABNORMAL LOW (ref 4.22–5.81)
RDW: 18.4 % — ABNORMAL HIGH (ref 11.5–15.5)
WBC: 10.5 10*3/uL (ref 4.0–10.5)

## 2014-12-04 LAB — COMPREHENSIVE METABOLIC PANEL
ALK PHOS: 133 U/L — AB (ref 39–117)
ALT: 53 U/L (ref 0–53)
AST: 119 U/L — ABNORMAL HIGH (ref 0–37)
Albumin: 2.2 g/dL — ABNORMAL LOW (ref 3.5–5.2)
Anion gap: 6 (ref 5–15)
BUN: 11 mg/dL (ref 6–23)
CO2: 20 mmol/L (ref 19–32)
CREATININE: 0.9 mg/dL (ref 0.50–1.35)
Calcium: 7.6 mg/dL — ABNORMAL LOW (ref 8.4–10.5)
Chloride: 110 mmol/L (ref 96–112)
GFR calc Af Amer: >90 mL/min (ref >90)
GFR calc non Af Amer: 87 mL/min — ABNORMAL LOW (ref >90)
GLUCOSE: 84 mg/dL (ref 70–99)
Potassium: 4 mmol/L (ref 3.5–5.1)
SODIUM: 136 mmol/L (ref 135–145)
Total Bilirubin: 9 mg/dL — ABNORMAL HIGH (ref 0.3–1.2)
Total Protein: 6.8 g/dL (ref 6.0–8.3)

## 2014-12-04 NOTE — Progress Notes (Signed)
UR completed 

## 2014-12-04 NOTE — Progress Notes (Signed)
PROGRESS NOTE  Allen Schroeder:[id number] DOB: 1951-01-23 DOA: 12/03/2014 PCP: Silvano Rusk, MD  64 y.o. year old male with significant past medical history of multiple medical problems including metastatic prostate cancer, alcoholic hepatitis w/ ascites, CAD, HTN, atrial fibrillation, jaundice, portal hypertension, hepatic encephalopathy presenting with fall, rib fractures, hypotension. The patient was recently discharged from the hospital on 11/24/2014 secondary to decompensated hepatitis with ascites. The patient had paracentesis at that time removing 3 L. His discharge weight was 212 pounds. The patient was discharged to [facility]. Apparently, the patient suffered a number of falls with the most recent fall resulting in right-sided rib fractures. In the emergency department, the patient was afebrile although mildly hypotensive with a systolic blood pressure in 80s. The patient was fluid resuscitated and his blood pressure improved. His diuretics were initially held.  Assessment/Plan: Rib fractures -Secondary to mechanical fall -Patient is presently controlled with opioids -Denies any respiratory distress -Incentive spirometry -Initial hypoxemia due to hypoventilation Hypotension -Blood pressure remained soft but stable - patient had intermittent soft blood pressures on his last admission  -Hold diuretics for today  -Continue IV fluids  Transaminitis with hyperbilirubinemia - alcohol induced hepatitis, LFT's overall unchanged, slightly better  - Bilirubin also slightly better  - Jaundiced skin, continue to monitor Alcoholic hepatitis with cirrhosis  -Appears to be compensated presently -Discharge weight 212 pounds -Check daily weights - has not had any alcohol since his last discharge to [facility] Paroxysmal Atrial fibrillation -satolol on hold due to soft BP -not AC candidate due to falls -continue plavix -CHADS-VASc = 3 (HTN, CAD, Age)  Acute and  intermittent encephalopathy - hepatic encephalopathy, ammonia level still elevated but trending down  - Will not check ammonia level unless pt decompensates as it does not correlate clearly to mental status  - Continue lactulose    Family Communication:   Wife and son updated at beside; total time 42min, >50% spent counseling and coordinating care Disposition Plan:   Home when medically stable (refuses SNF)        Procedures/Studies: Dg Chest 1 View  11/22/2014   CLINICAL DATA:  Dyspnea. History of atrial fibrillation and prostate carcinoma metastatic to the bones. Ex-smoker.  EXAM: CHEST  1 VIEW  COMPARISON:  02/19/2012  FINDINGS: Cardiac silhouette is normal in size. No mediastinal or hilar masses or evidence of adenopathy.  Clear lungs.  No pleural effusion or pneumothorax.  Old left-sided rib fracture.  No visible osteoblastic lesions  IMPRESSION: No acute cardiopulmonary disease.   Electronically Signed   By: Lajean Manes M.D.   On: 11/22/2014 11:12   Dg Ribs Unilateral W/chest Right  12/03/2014   CLINICAL DATA:  [place] at the nursing home wall transferring from bed to wheelchair, striking the back of the head. Patient has fallen 3-4 times in the past week. Mid to lower right chest/rib pain. Initial encounter.  EXAM: RIGHT RIBS AND CHEST - 3+ VIEW  COMPARISON:  No prior rib imaging. Chest x-ray 11/22/2014 and earlier. Whole-body bone scan 08/14/2012, 02/19/2012.  FINDINGS: Comminuted, mildly displaced fractures involving the right posterior tenth and eleventh ribs. Nondisplaced fracture involving the posterolateral eleventh rib.  Atelectasis involving the right lower lobe, superimposed on chronic elevation of the right hemidiaphragm. Lungs otherwise clear. No localized airspace consolidation. No pleural effusions. No pneumothorax. Normal pulmonary vascularity. Cardiac silhouette mildly enlarged for AP technique, unchanged.  IMPRESSION: 1. Acute traumatic fractures involving the right  posterior tenth,  eleventh and twelfth ribs. The tenth and eleventh rib fractures are mildly displaced, the 12 rib fracture is nondisplaced. 2. Right lower lobe atelectasis. No acute cardiopulmonary disease otherwise. Stable mild cardiomegaly.   Electronically Signed   By: Evangeline Dakin M.D.   On: 12/03/2014 19:16   Ct Head Wo Contrast  12/03/2014   CLINICAL DATA:  Fall transferring from bed to wheelchair. Patient struck back of head on bed radially. History of alcoholism. Prior history of prostate cancer.  EXAM: CT HEAD WITHOUT CONTRAST  CT CERVICAL SPINE WITHOUT CONTRAST  TECHNIQUE: Multidetector CT imaging of the head and cervical spine was performed following the standard protocol without intravenous contrast. Multiplanar CT image reconstructions of the cervical spine were also generated.  COMPARISON:  Head CT and neck CT 11/30/2007  FINDINGS: CT HEAD FINDINGS  No intracranial hemorrhage. No parenchymal contusion. No midline shift or mass effect. Basilar cisterns are patent. No skull base fracture. No fluid in the paranasal sinuses or mastoid air cells. Orbits are normal. There is generalized cortical atrophy and proportional ventricular dilatation. Mild periventricular white matter hypodensities.  CT CERVICAL SPINE FINDINGS  No prevertebral soft tissue swelling. Normal alignment of cervical vertebral bodies. No loss of vertebral body height. Normal facet articulation. Normal craniocervical junction.  No evidence epidural or paraspinal hematoma.  There is multiple levels of endplate spurring most severe from C5-C7. There is joint space narrowing at C6-C7.  There is a new sclerotic lesion within the pars interarticularis of the C5 vertebral body on the left. There is a rounded lesion within the T1 vertebral body which is new also (image 80, series 6) and measures 10 mm.  IMPRESSION: 1. No acute intracranial findings. 2. Cortical atrophy and ventricular dilatation similar prior. 3. No cervical spine fracture.  4. Sclerotic skeletal metastasis at C5 and T1 are new from CT of 11/30/2007.   Electronically Signed   By: Suzy Bouchard M.D.   On: 12/03/2014 21:03   Ct Cervical Spine Wo Contrast  12/03/2014   CLINICAL DATA:  Fall transferring from bed to wheelchair. Patient struck back of head on bed radially. History of alcoholism. Prior history of prostate cancer.  EXAM: CT HEAD WITHOUT CONTRAST  CT CERVICAL SPINE WITHOUT CONTRAST  TECHNIQUE: Multidetector CT imaging of the head and cervical spine was performed following the standard protocol without intravenous contrast. Multiplanar CT image reconstructions of the cervical spine were also generated.  COMPARISON:  Head CT and neck CT 11/30/2007  FINDINGS: CT HEAD FINDINGS  No intracranial hemorrhage. No parenchymal contusion. No midline shift or mass effect. Basilar cisterns are patent. No skull base fracture. No fluid in the paranasal sinuses or mastoid air cells. Orbits are normal. There is generalized cortical atrophy and proportional ventricular dilatation. Mild periventricular white matter hypodensities.  CT CERVICAL SPINE FINDINGS  No prevertebral soft tissue swelling. Normal alignment of cervical vertebral bodies. No loss of vertebral body height. Normal facet articulation. Normal craniocervical junction.  No evidence epidural or paraspinal hematoma.  There is multiple levels of endplate spurring most severe from C5-C7. There is joint space narrowing at C6-C7.  There is a new sclerotic lesion within the pars interarticularis of the C5 vertebral body on the left. There is a rounded lesion within the T1 vertebral body which is new also (image 80, series 6) and measures 10 mm.  IMPRESSION: 1. No acute intracranial findings. 2. Cortical atrophy and ventricular dilatation similar prior. 3. No cervical spine fracture. 4. Sclerotic skeletal metastasis at C5 and T1  are new from CT of 11/30/2007.   Electronically Signed   By: Suzy Bouchard M.D.   On: 12/03/2014 21:03    Ct Abdomen Pelvis W Contrast  11/11/2014   CLINICAL DATA:  Abdominal distention. Jaundice. History of metastatic prostate cancer in 2009 with ongoing hormonal chemotherapy.  EXAM: CT ABDOMEN AND PELVIS WITH CONTRAST  TECHNIQUE: Multidetector CT imaging of the abdomen and pelvis was performed using the standard protocol following bolus administration of intravenous contrast.  CONTRAST:  174mL OMNIPAQUE IOHEXOL 300 MG/ML  SOLN  COMPARISON:  Multiple exams, including 02/19/2012  FINDINGS: Lower chest: Subsegmental atelectasis or scarring in the right middle lobe, right lower lobe, lingula, and anteriorly in the left lower lobe. Multiple old left rib fractures. Considerable coronary artery atherosclerosis.  Hepatobiliary: Prominent hepatomegaly with irregular and indistinctly marginated extensive hepatic steatosis, reticular hepatic enhancement, and flattening/narrowing of the hepatic veins an to some degree of the intrahepatic IVC. Portal veins and tributaries appear patent and I do not see overt venous thrombosis.  Pancreas: Unremarkable  Spleen: Splenomegaly, splenic volume 900 cc.  Adrenals/Urinary Tract: 5.3 cm right kidney lower pole simple cyst.  Stomach/Bowel: Luminal irregularity impossible fold thickening in the cecum. Sigmoid diverticulosis.  Vascular/Lymphatic: Suspected varices in the gastrohepatic ligament. The portal vein is patent. Splenic vein patent. Narrowing of the hepatic veins and intrahepatic IVC is noted above.  Aortoiliac atherosclerotic vascular disease. Mild dilatation of both common iliac arteries. Scattered small porta hepatis lymph nodes.  Reproductive: Unremarkable  Other: Moderate scattered ascites.  Mesenteric edema.  Musculoskeletal: Most of the sclerotic metastatic disease involving the lumbar spine, ribs, and pelvis is stable from the prior exam, although there is a new right anterior L2 vertebral body sclerotic lesion on image 49 of series 2 which was not present on 02/19/2012.   Spondylosis and degenerative disc disease in the lumbar spine most notable at L3-4 and L4-5. Suspected left lateral recess disc herniation at L3-4.  IMPRESSION: 1. Hepatosplenomegaly with geographic fatty sparing or inflammation in the liver, potentially a result of cirrhosis or hepatitis. The degree of narrowing of the hepatic veins and intrahepatic IVC is worsened suggesting acute swelling of the liver. No vascular thrombosis observed. 2. Ascites. 3. Probably reactive adenopathy in the gastrohepatic ligament and porta hepatis. Mesenteric edema noted. 4. There is some mild irregularity of the lumen in the cecum. Cecal mass not readily excluded. In the nonacute setting consider colonoscopy if not recently performed. 5. Scattered sclerotic lesions from prior metastatic disease. Compared to the 2013 CT scan, I do see a new small sclerotic metastatic lesion anteriorly in the right L2 vertebra. 6. Atherosclerosis, including the coronary arteries. 7. Scattered atelectasis in the lung bases.   Electronically Signed   By: Van Clines M.D.   On: 11/11/2014 17:31   US Abdomen Limited  11/23/2014   CLINICAL DATA:  Ascites, past history of alcoholic hepatitis, hypertension, atrial fibrillation, former smoker  EXAM: LIMITED ABDOMEN ULTRASOUND FOR ASCITES  TECHNIQUE: Limited ultrasound survey for ascites was performed in all four abdominal quadrants.  COMPARISON:  11/19/2014  FINDINGS: Minimal ascites identified in the lower quadrants.  Volume of ascites is insufficient for paracentesis and represents a marked decrease since the previous exam.  IMPRESSION: Minimal ascites, insufficient for paracentesis.   Electronically Signed   By: Lavonia Dana M.D.   On: 11/23/2014 14:11   US Paracentesis  11/19/2014   INDICATION: Ascites, request for diagnostic and therapeutic paracentesis.  EXAM: ULTRASOUND-GUIDED PARACENTESIS  COMPARISON:  CT Abdomen/Pelvis 11/11/14.  MEDICATIONS: None.  COMPLICATIONS: None immediate  TECHNIQUE:  Informed written consent was obtained from the patient after a discussion of the risks, benefits and alternatives to treatment. A timeout was performed prior to the initiation of the procedure.  Initial ultrasound scanning demonstrates a moderate amount of ascites within the left lower abdominal quadrant. The left lower abdomen was prepped and draped in the usual sterile fashion. 1% lidocaine was used for local anesthesia. Under direct ultrasound guidance, a 19 gauge, 7-cm, Yueh catheter was introduced. An ultrasound image was saved for documentation purposed. The paracentesis was performed. The catheter was removed and a dressing was applied. The patient tolerated the procedure well without immediate post procedural complication.  FINDINGS: A total of approximately 3 liters of serous fluid was removed. Samples were sent to the laboratory as requested by the clinical team.  IMPRESSION: Successful ultrasound-guided paracentesis yielding 3 liters of peritoneal fluid.  Read By:  Tsosie Billing PA-C   Electronically Signed   By: Sandi Mariscal M.D.   On: 11/19/2014 10:13         Subjective: Patient denies fevers, chills, headache, chest pain, dyspnea, nausea, vomiting, diarrhea, abdominal pain, dysuria, hematuria   Objective: Filed Vitals:   12/03/14 2330 12/03/14 2351 12/04/14 0549 12/04/14 1430  BP: 107/66 95/62 100/60 96/53  Pulse:  67 62 64  Temp:  98.3 F (36.8 C) 98.4 F (36.9 C) 98.1 F (36.7 C)  TempSrc:  Oral Oral Oral  Resp: 21 18 18 20   Height:  6\' 2"  (1.88 m)    Weight:  93.3 kg (205 lb 11 oz)    SpO2:  94% 96% 99%    Intake/Output Summary (Last 24 hours) at 12/04/14 1739 Last data filed at 12/04/14 0933  Gross per 24 hour  Intake    250 ml  Output    300 ml  Net    -50 ml   Weight change:  Exam:   General:  Pt is alert, follows commands appropriately, not in acute distress  HEENT: No icterus, No thrush, No meningismus, [place]/AT  Cardiovascular: RRR, S1/S2, no rubs, no  gallops  Respiratory: CTA bilaterally, no wheezing, no crackles, no rhonchi  Abdomen: Soft/+BS, non tender, non distended, no guarding  Extremities: 1+LE edema, No lymphangitis, No petechiae, No rashes, no synovitis  Data Reviewed: Basic Metabolic Panel:  Recent Labs Lab 12/03/14 1841 12/04/14 0540  NA 133* 136  K 4.1 4.0  CL 107 110  CO2 21 20  GLUCOSE 86 84  BUN 12 11  CREATININE 1.01 0.90  CALCIUM 7.6* 7.6*   Liver Function Tests:  Recent Labs Lab 12/03/14 1841 12/04/14 0540  AST 136* 119*  ALT 60* 53  ALKPHOS 151* 133*  BILITOT 9.6* 9.0*  PROT 7.5 6.8  ALBUMIN 2.5* 2.2*   No results for input(s): LIPASE, AMYLASE in the last 168 hours.  Recent Labs Lab 12/03/14 1842  AMMONIA 49*   CBC:  Recent Labs Lab 12/03/14 1841 12/04/14 0540  WBC 12.0* 10.5  NEUTROABS 9.4* 7.8*  HGB 10.0* 9.6*  HCT 29.7* 29.3*  MCV 122.7* 124.2*  PLT 264 224   Cardiac Enzymes: No results for input(s): CKTOTAL, CKMB, CKMBINDEX, TROPONINI in the last 168 hours. BNP: Invalid input(s): POCBNP CBG: No results for input(s): GLUCAP in the last 168 hours.  No results found for this or any previous visit (from the past 240 hour(s)).   Scheduled Meds: . chlordiazePOXIDE  25 mg Oral TID  . clopidogrel  75 mg Oral Daily  .  folic acid  1 mg Oral Daily  . heparin  5,000 Units Subcutaneous 3 times per day  . lactulose  10 g Oral BID  . multivitamin with minerals  1 tablet Oral Daily  . sodium chloride  3 mL Intravenous Q12H  . spironolactone  25 mg Oral Daily  . thiamine  100 mg Oral Daily   Or  . thiamine  100 mg Intravenous Daily   Continuous Infusions: . sodium chloride 75 mL/hr at 12/04/14 0851     Babe Anthis, DO  [facility] Pager [phone]  If 7PM-7AM, please contact night-coverage [email or web address] Password [facility] 12/04/2014, 5:39 PM

## 2014-12-04 NOTE — Clinical Social Work Note (Addendum)
Clinical Social Work Assessment  Patient Details  Name: Allen Schroeder MRN: [id number] Date of Birth: 11-22-1950  Date of referral:  12/03/14               Reason for consult:   (FALL)                Permission sought to share information with:   (None) Permission granted to share information::  No  Name::        Agency::     Relationship::     Contact Information:     Housing/Transportation Living arrangements for the past 2 months:  [place] ([facility] [employer]) Source of Information:  Patient, Spouse Patient Interpreter Needed:  None Criminal Activity/Legal Involvement Pertinent to Current Situation/Hospitalization:  No - Comment as needed Significant Relationships:  Adult Children, Spouse Lives with:  Spouse (Wife states that the pt lives at home with her in [place].) Do you feel safe going back to the place where you live?  No (Family expressed that they have concerns with the pt returning home and if they would be able to properly care for him. As of now, the pt is at a SNF. Family states that they are not sure if they would  like the pt to be admitted into a ALF or not. ) Need for family participation in patient care:  Yes (Comment) (The pt has a great support system. Wife states that she has home health set up through an agency named " [facility]". )  Care giving concerns:  The pt is currently receiving care from a SNF. The pt has a supportive family.    Social Worker assessment / plan:  CSW met with pt at bedside. Family was present. Family confirms that the pt receives care from [facility]. Also, they confirm that the pt presents to [facility] due to a fall. Per note, pt fell while transferring himself from bed to wheelchair. Family states that the pt receives assistance with completing his ADL's at this time.  Wife informed CSW that the pt does fall often.  Wife states that she is the pt's primary support.  Wife/Allen W. [phone]  Employment status:    Insurance information:  Medicare PT Recommendations:  [place] / Referral to community resources:   (Patient is currently at a SNF.)  Patient/Family's Response to care:  Family expressed to CSW that they may be interested in the pt receiving more care once he leaves his current facility for rehab. However, family appears to be unsure. Wife states that she would like to have the pt home.   Patient appeared to be overwhelmed during the interview. He asked the family and CSW to politely stop talking about future placement.  Patient/Family's Understanding of and Emotional Response to Diagnosis, Current Treatment, and Prognosis:  Family and pt are aware that pt presents to [facility] due to fall.   Emotional Assessment Appearance:   (Patient face appears to be a dull yellow.) Attitude/Demeanor/Rapport:  Guarded, Avoidant (Patient informed CSW and family that he did not want to talk about placement at this time. Patient appears to be slightly overwhelmed.) Affect (typically observed):  Overwhelmed Orientation:  Oriented to Self, Oriented to Place, Oriented to  Time, Oriented to Situation Alcohol / Substance use:    Psych involvement (Current and /or in the community):  No (Comment)  Discharge Needs  Concerns to be addressed:  Adjustment to Illness Readmission within the last  30 days:  Yes Current discharge risk:  None Barriers to Discharge:  No Barriers Identified   Bernita Buffy, LCSW 12/04/2014, 12:04 AM

## 2014-12-04 NOTE — Progress Notes (Addendum)
CARE MANAGEMENT NOTE 12/04/2014  Patient:  Allen Schroeder, Allen Schroeder   Account Number:  [id number]  Date Initiated:  12/04/2014  Documentation initiated by:  Edwyna Shell  Subjective/Objective Assessment:   64 yo male admitted with rib fracture related to fall     Action/Plan:   discharge planning   Anticipated DC Date:  12/05/2014   Anticipated DC Plan:  [place]  CM consult      [facility] Choice  HOME HEALTH   Choice offered to / List presented to:  C-4 Adult Children           [place].   Status of service:  In process, will continue to follow Medicare Important Message given?   (If response is "NO", the following Medicare IM given date fields will be blank) Date Medicare IM given:   Medicare IM given by:   Date Additional Medicare IM given:   Additional Medicare IM given by:    Discharge Disposition:  [place]  Per UR Regulation:    If discussed at Long Length of Stay Meetings, dates discussed:    Comments:  4/49[birth year] Allen Chang RN BSN CM [phone] Spoke with patient son Allen Schroeder and he stated that patient was recenlty in the hospital and was discharged to [facility] SNF fro rehab. Their preference now is the take the patient home. They have hired 24/7 caregivers that will be intitiated upon discharge. They have chosen [facility] for [facility] services and would like all disciplines: RN, PT, OT, SW, aide, awaiting orders. They would also like DME to be delivered to the home prior to dc: hospital bed, wheelchair, roll walker, 3in1. They would like the patient to be transported home via [facility] at time of discharge. Patient PCP is Allen Schroeder with [facility]

## 2014-12-04 NOTE — Evaluation (Signed)
Physical Therapy Evaluation Patient Details Name: Allen Schroeder MRN: [id number] DOB: 1951-04-08 Today's Date: 12/04/2014   History of Present Illness  64 y.o. year old male with significant past medical history of multiple medical problems including metastatic prostate cancer, alcoholic hepatitis w/ ascites, CAD, HTN, atrial fibrillation, jaundice, portal hypertension, hepatic encephalopathy admitted after fall at SNF with resulting rib fractures and hypotension  Clinical Impression  Pt admitted with above diagnosis. Pt currently with functional limitations due to the deficits listed below (see PT Problem List).  Pt will benefit from skilled PT to increase their independence and safety with mobility to allow discharge to the venue listed below.   Pt presents with overall limited endurance and generalized weakness.  Family plan to take pt home, therefore would recommend HHPT and equipment listed below.  Due to falls and weakness, recommend 24/7 assist.      Follow Up Recommendations SNF (recommend SNF however family and pt plan for d/c home)    Equipment Recommendations  Rolling walker with 5" wheels;3in1 (PT);Wheelchair (measurements PT);Wheelchair cushion (measurements PT);Hospital bed    Recommendations for Other Services       Precautions / Restrictions Precautions Precautions: Fall      Mobility  Bed Mobility Overal bed mobility: Needs Assistance Bed Mobility: Sidelying to Sit;Rolling Rolling: Mod assist Sidelying to sit: Max assist;+2 for physical assistance       General bed mobility comments: pt educated on log roll technique due to rib fx pain, required assist and tactile cues especially for trunk upright  Transfers Overall transfer level: Needs assistance Equipment used: Rolling walker (2 wheeled) Transfers: Sit to/from [facility] Sit to Stand: Mod assist Stand pivot transfers: Min assist       General transfer comment: verbal cues for hand  placement, assist to rise and steady, pt with difficulty moving LEs to side step to recliner so deferred ambulation today for safety  Ambulation/Gait                Stairs            Wheelchair Mobility    Modified Rankin (Stroke Patients Only)       Balance                                             Pertinent Vitals/Pain Pain Assessment: Faces Faces Pain Scale: Hurts little more Pain Location: right ribs Pain Descriptors / Indicators: Sore Pain Intervention(s): Limited activity within patient's tolerance;Monitored during session;Repositioned    Home Living Family/patient expects to be discharged to:: Private residence Living Arrangements: Spouse/significant other Available Help at Discharge: Family Type of Home: House Home Access: Stairs to enter   Technical brewer of Steps: 3-4 Home Layout: Two level;Bed/bath upstairs Home Equipment: None Additional Comments: family plans to take pt home with hospital bed on main level which does not have a full bath    Prior Function Level of Independence: Needs assistance   Gait / Transfers Assistance Needed: pt reports transferring to w/c, not ambulating very much at SNF     Comments: independent prior to last admission (discharged to SNF for rehab)     Hand Dominance        Extremity/Trunk Assessment               Lower Extremity Assessment: Generalized weakness (grossly able to move bil LEs against gravity)  Communication   Communication: No difficulties (little slow to process and slow to speak)  Cognition Arousal/Alertness: Awake/alert Behavior During Therapy: WFL for tasks assessed/performed Overall Cognitive Status: Within Functional Limits for tasks assessed                      General Comments      Exercises        Assessment/Plan    PT Assessment Patient needs continued PT services  PT Diagnosis Difficulty walking;Generalized weakness    PT Problem List Decreased strength;Decreased activity tolerance;Decreased balance;Decreased mobility;Decreased knowledge of use of DME;Decreased safety awareness;Pain  PT Treatment Interventions DME instruction;Gait training;Stair training;Functional mobility training;Therapeutic activities;Therapeutic exercise;Patient/family education;Neuromuscular re-education;Wheelchair mobility training   PT Goals (Current goals can be found in the Care Plan section) Acute Rehab PT Goals PT Goal Formulation: With patient Time For Goal Achievement: 12/18/14 Potential to Achieve Goals: Good    Frequency Min 3X/week   Barriers to discharge        Co-evaluation               End of Session Equipment Utilized During Treatment: Gait belt Activity Tolerance: Patient limited by fatigue Patient left: in chair;with call bell/phone within reach;with nursing/sitter in room Nurse Communication: Mobility status (NT assisted with pericare with transfers, aware pt in recliner)    Functional Assessment Tool Used: clinical judgement Functional Limitation: Mobility: Walking and moving around Mobility: Walking and Moving Around Current Status [phone]): At least 40 percent but less than 60 percent impaired, limited or restricted Mobility: Walking and Moving Around Goal Status [phone]): At least 1 percent but less than 20 percent impaired, limited or restricted    Time: 0865-7846 PT Time Calculation (min) (ACUTE ONLY): 15 min   Charges:   PT Evaluation $Initial PT Evaluation Tier I: 1 Procedure     PT G Codes:   PT G-Codes **NOT FOR INPATIENT CLASS** Functional Assessment Tool Used: clinical judgement Functional Limitation: Mobility: Walking and moving around Mobility: Walking and Moving Around Current Status ([id number]): At least 40 percent but less than 60 percent impaired, limited or restricted Mobility: Walking and Moving Around Goal Status [phone]): At least 1 percent but less than 20 percent impaired,  limited or restricted    Makinsey Pepitone,KATHrine E 12/04/2014, 12:45 PM Carmelia Bake, PT, DPT 12/04/2014 Pager: [phone]

## 2014-12-05 DIAGNOSIS — S2241XA Multiple fractures of ribs, right side, initial encounter for closed fracture: Secondary | ICD-10-CM | POA: Diagnosis not present

## 2014-12-05 DIAGNOSIS — S2231XK Fracture of one rib, right side, subsequent encounter for fracture with nonunion: Secondary | ICD-10-CM | POA: Diagnosis not present

## 2014-12-05 DIAGNOSIS — I480 Paroxysmal atrial fibrillation: Secondary | ICD-10-CM

## 2014-12-05 DIAGNOSIS — I9589 Other hypotension: Secondary | ICD-10-CM | POA: Diagnosis not present

## 2014-12-05 DIAGNOSIS — K7011 Alcoholic hepatitis with ascites: Secondary | ICD-10-CM | POA: Diagnosis not present

## 2014-12-05 LAB — COMPREHENSIVE METABOLIC PANEL
ALT: 52 U/L (ref 0–53)
AST: 124 U/L — AB (ref 0–37)
Albumin: 2.2 g/dL — ABNORMAL LOW (ref 3.5–5.2)
Alkaline Phosphatase: 135 U/L — ABNORMAL HIGH (ref 39–117)
Anion gap: 6 (ref 5–15)
BILIRUBIN TOTAL: 9.6 mg/dL — AB (ref 0.3–1.2)
BUN: 10 mg/dL (ref 6–23)
CO2: 21 mmol/L (ref 19–32)
CREATININE: 0.8 mg/dL (ref 0.50–1.35)
Calcium: 7.5 mg/dL — ABNORMAL LOW (ref 8.4–10.5)
Chloride: 109 mmol/L (ref 96–112)
GFR calc Af Amer: >90 mL/min (ref >90)
GLUCOSE: 93 mg/dL (ref 70–99)
Potassium: 3.4 mmol/L — ABNORMAL LOW (ref 3.5–5.1)
SODIUM: 136 mmol/L (ref 135–145)
Total Protein: 6.7 g/dL (ref 6.0–8.3)

## 2014-12-05 LAB — CBC WITH DIFFERENTIAL/PLATELET
BASOS PCT: 1 % (ref 0–1)
Basophils Absolute: 0.1 10*3/uL (ref 0.0–0.1)
EOS ABS: 0.4 10*3/uL (ref 0.0–0.7)
EOS PCT: 4 % (ref 0–5)
HCT: 28.1 % — ABNORMAL LOW (ref 39.0–52.0)
HEMOGLOBIN: 9.1 g/dL — AB (ref 13.0–17.0)
Lymphocytes Relative: 11 % — ABNORMAL LOW (ref 12–46)
Lymphs Abs: 1.2 10*3/uL (ref 0.7–4.0)
MCH: 40.6 pg — ABNORMAL HIGH (ref 26.0–34.0)
MCHC: 32.4 g/dL (ref 30.0–36.0)
MCV: 125.4 fL — ABNORMAL HIGH (ref 78.0–100.0)
MONO ABS: 0.8 10*3/uL (ref 0.1–1.0)
Monocytes Relative: 7 % (ref 3–12)
NEUTROS PCT: 77 % (ref 43–77)
Neutro Abs: 8.6 10*3/uL — ABNORMAL HIGH (ref 1.7–7.7)
PLATELETS: 207 10*3/uL (ref 150–400)
RBC: 2.24 MIL/uL — AB (ref 4.22–5.81)
RDW: 17.9 % — ABNORMAL HIGH (ref 11.5–15.5)
WBC: 11.1 10*3/uL — ABNORMAL HIGH (ref 4.0–10.5)

## 2014-12-05 MED ORDER — CHLORDIAZEPOXIDE HCL 25 MG PO CAPS
25.0000 mg | ORAL_CAPSULE | Freq: Three times a day (TID) | ORAL | Status: DC
Start: 2014-12-05 — End: 2014-12-06

## 2014-12-05 MED ORDER — OXYCODONE HCL 5 MG PO TABS: 5.0000 mg | ORAL_TABLET | ORAL | Status: DC | PRN

## 2014-12-05 MED ORDER — OXYCODONE HCL 5 MG PO TABS
5.0000 mg | ORAL_TABLET | ORAL | Status: DC | PRN
  Administered 2014-12-05 (×2): 5 mg via ORAL
  Filled 2014-12-05 (×2): qty 1

## 2014-12-05 MED ORDER — SODIUM CHLORIDE 0.9 % IV SOLN
INTRAVENOUS | Status: DC
  Administered 2014-12-05: 23:00:00 via INTRAVENOUS
  Filled 2014-12-05 (×2): qty 1000

## 2014-12-05 NOTE — Discharge Summary (Signed)
Physician Discharge Summary  Allen Schroeder:[id number] DOB: 08/27/1950 DOA: 12/03/2014  PCP: [name], MD  Admit date: 12/03/2014 Discharge date: 12/05/2014  Recommendations for Outpatient Follow-up:  1. Pt will need to follow up with PCP on 12/08/14 2. BMP and CBC in one week  Discharge Diagnoses:  Rib fractures -Secondary to mechanical fall -Patient is presently controlled with opioids -Denies any respiratory distress -Incentive spirometry -Initial hypoxemia due to hypoventilation-->stable on room air -home with OxyIR 5mg , #30, one po q 4 hrs prn pain, no RF Hypotension -Blood pressure remained soft but stable-->improved to 110s - patient had intermittent soft blood pressures on his last admission  -restart previous diuretic regimen--lasix 10mg  daily, aldactone 25mg  daily -Continue IV fluids-->saline lock Transaminitis with hyperbilirubinemia - alcohol induced hepatitis, LFT's overall unchanged, slightly better  - Bilirubin better than usual baseline of 11-12 -total bili 9.0-9.6 during the admission - Jaundiced skin, continue to monitor Alcoholic hepatitis with cirrhosis  -Appears to be compensated presently -Discharge weight 212 pounds -Check daily weights - has not had any alcohol since his last discharge to [facility]-->continue librium with which he was discharge to [facility] home on 4/19 -continue lactulose to have 2 BMs daily Paroxysmal Atrial fibrillation -satolol on hold due to soft BP-->restart as BP improved -not AC candidate due to falls -continue plavix -CHADS-VASc = 3 (HTN, CAD, Age)  Acute and intermittent encephalopathy - hepatic encephalopathy, ammonia level still elevated but pt is lucid at his baseline - Will not check ammonia level unless pt decompensates as it does not correlate clearly to mental status  - Continue lactulose  Deconditioning -The patient was evaluated by physical therapy--> skilled nursing facility was recommended;  however patient and family refused -Home health PT, OT, RN, health aide was set up with the assistance of case management Discharge Condition: stable  Disposition: home with home health  Diet: low sodium Wt Readings from Last 3 Encounters:  12/03/14 93.3 kg (205 lb 11 oz)  11/24/14 96.934 kg (213 lb 11.2 oz)  11/18/14 105.688 kg (233 lb)    History of present illness:  64 y.o. year old male with significant past medical history of multiple medical problems including metastatic prostate cancer, alcoholic hepatitis w/ ascites, CAD, HTN, atrial fibrillation, jaundice, portal hypertension, hepatic encephalopathy presenting with fall, rib fractures, hypotension. The patient was recently discharged from the hospital on 11/24/2014 secondary to decompensated hepatitis with ascites. The patient had paracentesis at that time removing 3 L. His discharge weight was 212 pounds. The patient was discharged to [facility]. Apparently, the patient suffered a number of falls with the most recent fall resulting in right-sided rib fractures. In the emergency department, the patient was afebrile although mildly hypotensive with a systolic blood pressure in 80s. The patient was fluid resuscitated and his blood pressure improved. His diuretics were initially held, but were restarted at the time of discharge. In addition, the patient's sotalol will be restarted. The patient remained in sinus rhythm. At the time of discharge, the patient's systolic blood pressure remained stable in the 110s. The patient's pain was controlled, and he was tolerating his diet. His lactulose will be continued to have at least 2 bowel movements daily. The patient's mental status remained at baseline, and he was conversant and lucid during the hospitalization.  Discharge Exam: Filed Vitals:   12/05/14 0557  BP: 113/64  Pulse: 73  Temp: 98.3 F (36.8 C)  Resp: 20   Filed Vitals:   12/04/14 0549 12/04/14 1430 12/04/14 2358  12/05/14 0557    BP: 100/60 96/53 111/60 113/64  Pulse: 62 64 73 73  Temp: 98.4 F (36.9 C) 98.1 F (36.7 C) 97.9 F (36.6 C) 98.3 F (36.8 C)  TempSrc: Oral Oral Oral Oral  Resp: 18 20 22 20   Height:      Weight:      SpO2: 96% 99% 99% 98%   General: A&O x 3, NAD, pleasant, cooperative Cardiovascular: RRR, no rub, no gallop, no S3 Respiratory: Bibasilar crackles. Left lung auscultation. No wheeze Abdomen:soft, nontender, nondistended, positive bowel sounds Extremities: 1+LE edema, No lymphangitis, no petechiae  Discharge Instructions      Discharge Instructions    Diet - low sodium heart healthy    Complete by:  As directed      Increase activity slowly    Complete by:  As directed             Medication List    STOP taking these medications        BENADRYL 25 MG tablet  Generic drug:  diphenhydrAMINE     metoprolol succinate 25 MG 24 hr tablet  Commonly known as:  TOPROL-XL     temazepam 7.5 MG capsule  Commonly known as:  RESTORIL     traMADol 50 MG tablet  Commonly known as:  ULTRAM      TAKE these medications        CALCIUM CARB-CHOLECALCIFEROL PO  Take 1 capsule by mouth daily.     chlordiazePOXIDE 25 MG capsule  Commonly known as:  LIBRIUM  Take 1 capsule (25 mg total) by mouth 3 (three) times daily.     clopidogrel 75 MG tablet  Commonly known as:  PLAVIX  TAKE ONE TABLET BY MOUTH ONCE DAILY     folic acid 1 MG tablet  Commonly known as:  FOLVITE  Take one tablet by mouth once a day....     furosemide 20 MG tablet  Commonly known as:  LASIX  Take 0.5 tablets (10 mg total) by mouth daily.     lactulose 10 GM/15ML solution  Commonly known as:  CHRONULAC  Take 15 mLs (10 g total) by mouth 2 (two) times daily.     multivitamin tablet  Take 1 tablet by mouth daily.     oxyCODONE 5 MG immediate release tablet  Commonly known as:  Oxy IR/ROXICODONE  Take 1 tablet (5 mg total) by mouth every 4 (four) hours as needed for moderate pain.     potassium  chloride SA 20 MEQ tablet  Commonly known as:  K-DUR,KLOR-CON  Take 1 tablet (20 mEq total) by mouth 2 (two) times daily.     sotalol 80 MG tablet  Commonly known as:  BETAPACE  Take 1 tablet (80 mg total) by mouth daily. T     spironolactone 25 MG tablet  Commonly known as:  ALDACTONE  Take 1 tablet (25 mg total) by mouth daily.     UNKNOWN TO PATIENT  Slow release hormone implant placed 8/13 by Dr. Willette Cluster.  Good for one year         The results of significant diagnostics from this hospitalization (including imaging, microbiology, ancillary and laboratory) are listed below for reference.    Significant Diagnostic Studies: Dg Chest 1 View  11/22/2014   CLINICAL DATA:  Dyspnea. History of atrial fibrillation and prostate carcinoma metastatic to the bones. Ex-smoker.  EXAM: CHEST  1 VIEW  COMPARISON:  02/19/2012  FINDINGS: Cardiac silhouette is normal in  size. No mediastinal or hilar masses or evidence of adenopathy.  Clear lungs.  No pleural effusion or pneumothorax.  Old left-sided rib fracture.  No visible osteoblastic lesions  IMPRESSION: No acute cardiopulmonary disease.   Electronically Signed   By: Lajean Manes M.D.   On: 11/22/2014 11:12   Dg Ribs Unilateral W/chest Right  12/03/2014   CLINICAL DATA:  [place] at the nursing home wall transferring from bed to wheelchair, striking the back of the head. Patient has fallen 3-4 times in the past week. Mid to lower right chest/rib pain. Initial encounter.  EXAM: RIGHT RIBS AND CHEST - 3+ VIEW  COMPARISON:  No prior rib imaging. Chest x-ray 11/22/2014 and earlier. Whole-body bone scan 08/14/2012, 02/19/2012.  FINDINGS: Comminuted, mildly displaced fractures involving the right posterior tenth and eleventh ribs. Nondisplaced fracture involving the posterolateral eleventh rib.  Atelectasis involving the right lower lobe, superimposed on chronic elevation of the right hemidiaphragm. Lungs otherwise clear. No localized airspace  consolidation. No pleural effusions. No pneumothorax. Normal pulmonary vascularity. Cardiac silhouette mildly enlarged for AP technique, unchanged.  IMPRESSION: 1. Acute traumatic fractures involving the right posterior tenth, eleventh and twelfth ribs. The tenth and eleventh rib fractures are mildly displaced, the 12 rib fracture is nondisplaced. 2. Right lower lobe atelectasis. No acute cardiopulmonary disease otherwise. Stable mild cardiomegaly.   Electronically Signed   By: Evangeline Dakin M.D.   On: 12/03/2014 19:16   Ct Head Wo Contrast  12/03/2014   CLINICAL DATA:  Fall transferring from bed to wheelchair. Patient struck back of head on bed radially. History of alcoholism. Prior history of prostate cancer.  EXAM: CT HEAD WITHOUT CONTRAST  CT CERVICAL SPINE WITHOUT CONTRAST  TECHNIQUE: Multidetector CT imaging of the head and cervical spine was performed following the standard protocol without intravenous contrast. Multiplanar CT image reconstructions of the cervical spine were also generated.  COMPARISON:  Head CT and neck CT 11/30/2007  FINDINGS: CT HEAD FINDINGS  No intracranial hemorrhage. No parenchymal contusion. No midline shift or mass effect. Basilar cisterns are patent. No skull base fracture. No fluid in the paranasal sinuses or mastoid air cells. Orbits are normal. There is generalized cortical atrophy and proportional ventricular dilatation. Mild periventricular white matter hypodensities.  CT CERVICAL SPINE FINDINGS  No prevertebral soft tissue swelling. Normal alignment of cervical vertebral bodies. No loss of vertebral body height. Normal facet articulation. Normal craniocervical junction.  No evidence epidural or paraspinal hematoma.  There is multiple levels of endplate spurring most severe from C5-C7. There is joint space narrowing at C6-C7.  There is a new sclerotic lesion within the pars interarticularis of the C5 vertebral body on the left. There is a rounded lesion within the T1  vertebral body which is new also (image 80, series 6) and measures 10 mm.  IMPRESSION: 1. No acute intracranial findings. 2. Cortical atrophy and ventricular dilatation similar prior. 3. No cervical spine fracture. 4. Sclerotic skeletal metastasis at C5 and T1 are new from CT of 11/30/2007.   Electronically Signed   By: Suzy Bouchard M.D.   On: 12/03/2014 21:03   Ct Cervical Spine Wo Contrast  12/03/2014   CLINICAL DATA:  Fall transferring from bed to wheelchair. Patient struck back of head on bed radially. History of alcoholism. Prior history of prostate cancer.  EXAM: CT HEAD WITHOUT CONTRAST  CT CERVICAL SPINE WITHOUT CONTRAST  TECHNIQUE: Multidetector CT imaging of the head and cervical spine was performed following the standard protocol without intravenous contrast. Multiplanar  CT image reconstructions of the cervical spine were also generated.  COMPARISON:  Head CT and neck CT 11/30/2007  FINDINGS: CT HEAD FINDINGS  No intracranial hemorrhage. No parenchymal contusion. No midline shift or mass effect. Basilar cisterns are patent. No skull base fracture. No fluid in the paranasal sinuses or mastoid air cells. Orbits are normal. There is generalized cortical atrophy and proportional ventricular dilatation. Mild periventricular white matter hypodensities.  CT CERVICAL SPINE FINDINGS  No prevertebral soft tissue swelling. Normal alignment of cervical vertebral bodies. No loss of vertebral body height. Normal facet articulation. Normal craniocervical junction.  No evidence epidural or paraspinal hematoma.  There is multiple levels of endplate spurring most severe from C5-C7. There is joint space narrowing at C6-C7.  There is a new sclerotic lesion within the pars interarticularis of the C5 vertebral body on the left. There is a rounded lesion within the T1 vertebral body which is new also (image 80, series 6) and measures 10 mm.  IMPRESSION: 1. No acute intracranial findings. 2. Cortical atrophy and  ventricular dilatation similar prior. 3. No cervical spine fracture. 4. Sclerotic skeletal metastasis at C5 and T1 are new from CT of 11/30/2007.   Electronically Signed   By: Suzy Bouchard M.D.   On: 12/03/2014 21:03   Ct Abdomen Pelvis W Contrast  11/11/2014   CLINICAL DATA:  Abdominal distention. Jaundice. History of metastatic prostate cancer in 2009 with ongoing hormonal chemotherapy.  EXAM: CT ABDOMEN AND PELVIS WITH CONTRAST  TECHNIQUE: Multidetector CT imaging of the abdomen and pelvis was performed using the standard protocol following bolus administration of intravenous contrast.  CONTRAST:  172mL OMNIPAQUE IOHEXOL 300 MG/ML  SOLN  COMPARISON:  Multiple exams, including 02/19/2012  FINDINGS: Lower chest: Subsegmental atelectasis or scarring in the right middle lobe, right lower lobe, lingula, and anteriorly in the left lower lobe. Multiple old left rib fractures. Considerable coronary artery atherosclerosis.  Hepatobiliary: Prominent hepatomegaly with irregular and indistinctly marginated extensive hepatic steatosis, reticular hepatic enhancement, and flattening/narrowing of the hepatic veins an to some degree of the intrahepatic IVC. Portal veins and tributaries appear patent and I do not see overt venous thrombosis.  Pancreas: Unremarkable  Spleen: Splenomegaly, splenic volume 900 cc.  Adrenals/Urinary Tract: 5.3 cm right kidney lower pole simple cyst.  Stomach/Bowel: Luminal irregularity impossible fold thickening in the cecum. Sigmoid diverticulosis.  Vascular/Lymphatic: Suspected varices in the gastrohepatic ligament. The portal vein is patent. Splenic vein patent. Narrowing of the hepatic veins and intrahepatic IVC is noted above.  Aortoiliac atherosclerotic vascular disease. Mild dilatation of both common iliac arteries. Scattered small porta hepatis lymph nodes.  Reproductive: Unremarkable  Other: Moderate scattered ascites.  Mesenteric edema.  Musculoskeletal: Most of the sclerotic metastatic  disease involving the lumbar spine, ribs, and pelvis is stable from the prior exam, although there is a new right anterior L2 vertebral body sclerotic lesion on image 49 of series 2 which was not present on 02/19/2012.  Spondylosis and degenerative disc disease in the lumbar spine most notable at L3-4 and L4-5. Suspected left lateral recess disc herniation at L3-4.  IMPRESSION: 1. Hepatosplenomegaly with geographic fatty sparing or inflammation in the liver, potentially a result of cirrhosis or hepatitis. The degree of narrowing of the hepatic veins and intrahepatic IVC is worsened suggesting acute swelling of the liver. No vascular thrombosis observed. 2. Ascites. 3. Probably reactive adenopathy in the gastrohepatic ligament and porta hepatis. Mesenteric edema noted. 4. There is some mild irregularity of the lumen in the cecum.  Cecal mass not readily excluded. In the nonacute setting consider colonoscopy if not recently performed. 5. Scattered sclerotic lesions from prior metastatic disease. Compared to the 2013 CT scan, I do see a new small sclerotic metastatic lesion anteriorly in the right L2 vertebra. 6. Atherosclerosis, including the coronary arteries. 7. Scattered atelectasis in the lung bases.   Electronically Signed   By: Van Clines M.D.   On: 11/11/2014 17:31   US Abdomen Limited  11/23/2014   CLINICAL DATA:  Ascites, past history of alcoholic hepatitis, hypertension, atrial fibrillation, former smoker  EXAM: LIMITED ABDOMEN ULTRASOUND FOR ASCITES  TECHNIQUE: Limited ultrasound survey for ascites was performed in all four abdominal quadrants.  COMPARISON:  11/19/2014  FINDINGS: Minimal ascites identified in the lower quadrants.  Volume of ascites is insufficient for paracentesis and represents a marked decrease since the previous exam.  IMPRESSION: Minimal ascites, insufficient for paracentesis.   Electronically Signed   By: Lavonia Dana M.D.   On: 11/23/2014 14:11   US  Paracentesis  11/19/2014   INDICATION: Ascites, request for diagnostic and therapeutic paracentesis.  EXAM: ULTRASOUND-GUIDED PARACENTESIS  COMPARISON:  CT Abdomen/Pelvis 11/11/14.  MEDICATIONS: None.  COMPLICATIONS: None immediate  TECHNIQUE: Informed written consent was obtained from the patient after a discussion of the risks, benefits and alternatives to treatment. A timeout was performed prior to the initiation of the procedure.  Initial ultrasound scanning demonstrates a moderate amount of ascites within the left lower abdominal quadrant. The left lower abdomen was prepped and draped in the usual sterile fashion. 1% lidocaine was used for local anesthesia. Under direct ultrasound guidance, a 19 gauge, 7-cm, Yueh catheter was introduced. An ultrasound image was saved for documentation purposed. The paracentesis was performed. The catheter was removed and a dressing was applied. The patient tolerated the procedure well without immediate post procedural complication.  FINDINGS: A total of approximately 3 liters of serous fluid was removed. Samples were sent to the laboratory as requested by the clinical team.  IMPRESSION: Successful ultrasound-guided paracentesis yielding 3 liters of peritoneal fluid.  Read By:  Tsosie Billing PA-C   Electronically Signed   By: Sandi Mariscal M.D.   On: 11/19/2014 10:13     Microbiology: No results found for this or any previous visit (from the past 240 hour(s)).   Labs: Basic Metabolic Panel:  Recent Labs Lab 12/03/14 1841 12/04/14 0540 12/05/14 0540  NA 133* 136 136  K 4.1 4.0 3.4*  CL 107 110 109  CO2 21 20 21   GLUCOSE 86 84 93  BUN 12 11 10   CREATININE 1.01 0.90 0.80  CALCIUM 7.6* 7.6* 7.5*   Liver Function Tests:  Recent Labs Lab 12/03/14 1841 12/04/14 0540 12/05/14 0540  AST 136* 119* 124*  ALT 60* 53 52  ALKPHOS 151* 133* 135*  BILITOT 9.6* 9.0* 9.6*  PROT 7.5 6.8 6.7  ALBUMIN 2.5* 2.2* 2.2*   No results for input(s): LIPASE, AMYLASE in the  last 168 hours.  Recent Labs Lab 12/03/14 1842  AMMONIA 49*   CBC:  Recent Labs Lab 12/03/14 1841 12/04/14 0540 12/05/14 0540  WBC 12.0* 10.5 11.1*  NEUTROABS 9.4* 7.8* 8.6*  HGB 10.0* 9.6* 9.1*  HCT 29.7* 29.3* 28.1*  MCV 122.7* 124.2* 125.4*  PLT 264 224 207   Cardiac Enzymes: No results for input(s): CKTOTAL, CKMB, CKMBINDEX, TROPONINI in the last 168 hours. BNP: Invalid input(s): POCBNP CBG: No results for input(s): GLUCAP in the last 168 hours.  Time coordinating discharge:  Greater than  30 minutes  Signed:  Jahsir Rama, DO [facility] Pager: [phone] 12/05/2014, 11:00 AM

## 2014-12-05 NOTE — Care Management (Signed)
Utilization Review completed.  

## 2014-12-05 NOTE — Progress Notes (Signed)
CARE MANAGEMENT NOTE 12/05/2014  Patient:  Allen Schroeder, Allen Schroeder   Account Number:  [id number]  Date Initiated:  12/04/2014  Documentation initiated by:  Edwyna Shell  Subjective/Objective Assessment:   64 yo male admitted with rib fracture related to fall     Action/Plan:   discharge planning   Anticipated DC Date:  12/05/2014   Anticipated DC Plan:  [place]  CM consult      [facility] Choice  HOME HEALTH   Choice offered to / List presented to:  C-4 Adult Children   DME arranged  [place] [facility]      DME agency  [place] arranged  HH-1 RN  [place]      [place] agency  [place].   Status of service:  Completed, signed off Medicare Important Message given?  YES (If response is "NO", the following Medicare IM given date fields will be blank) Date Medicare IM given:  12/05/2014 Medicare IM given by:  [facility] Date Additional Medicare IM given:   Additional Medicare IM given by:    Discharge Disposition:  [place]  Per UR Regulation:    If discussed at Long Length of Stay Meetings, dates discussed:    Comments:  11/25/2014 1400 Pt's family did Medicare Appeal. Noted pt was obs and Medicare Appeal process is only for pt's with IP status. NCM explained to pt's family that appeal process does not apply to pt's obs status. Pt family states they thought pt was IP on time of admission. Faxed [facility] notification on obs status currently. And per Judithann Graves, case will  be closed and they will notify family. Pt had concerns with pt's currently medical tx. NCM contacted attending, Dr. Carles Collet. Jonnie Finner RN CCM Case Mgmt phone [phone]  12/05/2014 1100 NCM spoke to pt and gave permission to speak to wife and children and [facility] scheduled to deliver hospital bed today. Contacted [facility] and bed deliver is scheduled  for 1:30 pm. Jonnie Finner RN CCM Case Mgmt phone [phone]  12/04/14 Leanne Chang RN BSN CM [phone] Spoke with patient son Tyee Vandevoorde and he stated that patient was recenlty in the hospital and was discharged to [facility] fro rehab. Their preference now is the take the patient home. They have hired 24/7 caregivers that will be intitiated upon discharge. They have chosen [facility] for [facility] services and would like all disciplines: RN, PT, OT, SW, aide, awaiting orders. They would also like DME to be delivered to the home prior to dc: hospital bed, wheelchair, roll walker, 3in1. They would like the patient to be transported home via [facility] at time of discharge. Patient PCP is Rachell Cipro Reviewed and DME(R.W.,3 in 1,Wheelchair and electric hospital bed)(wife understands there may be a copay for the dme) and [place] RN,PT,OT and aide ordered through [place].

## 2014-12-06 DIAGNOSIS — S2241XA Multiple fractures of ribs, right side, initial encounter for closed fracture: Secondary | ICD-10-CM | POA: Diagnosis not present

## 2014-12-06 DIAGNOSIS — N189 Chronic kidney disease, unspecified: Secondary | ICD-10-CM | POA: Diagnosis not present

## 2014-12-06 LAB — COMPREHENSIVE METABOLIC PANEL
ALBUMIN: 2.1 g/dL — AB (ref 3.5–5.0)
ALT: 51 U/L (ref 17–63)
AST: 128 U/L — AB (ref 15–41)
Alkaline Phosphatase: 141 U/L — ABNORMAL HIGH (ref 38–126)
Anion gap: 7 (ref 5–15)
BUN: 8 mg/dL (ref 6–20)
CHLORIDE: 108 mmol/L (ref 101–111)
CO2: 19 mmol/L — ABNORMAL LOW (ref 22–32)
Calcium: 7.6 mg/dL — ABNORMAL LOW (ref 8.9–10.3)
Creatinine, Ser: 0.81 mg/dL (ref 0.61–1.24)
GFR calc Af Amer: >60 mL/min (ref >60)
GFR calc non Af Amer: >60 mL/min (ref >60)
Glucose, Bld: 112 mg/dL — ABNORMAL HIGH (ref 70–99)
Potassium: 3.4 mmol/L — ABNORMAL LOW (ref 3.5–5.1)
Sodium: 134 mmol/L — ABNORMAL LOW (ref 135–145)
Total Bilirubin: 9.6 mg/dL — ABNORMAL HIGH (ref 0.3–1.2)
Total Protein: 6.7 g/dL (ref 6.5–8.1)

## 2014-12-06 LAB — CBC WITH DIFFERENTIAL/PLATELET
Basophils Absolute: 0.1 10*3/uL (ref 0.0–0.1)
Basophils Relative: 1 % (ref 0–1)
Eosinophils Absolute: 0.5 10*3/uL (ref 0.0–0.7)
Eosinophils Relative: 5 % (ref 0–5)
HCT: 27.6 % — ABNORMAL LOW (ref 39.0–52.0)
Hemoglobin: 9.2 g/dL — ABNORMAL LOW (ref 13.0–17.0)
LYMPHS PCT: 14 % (ref 12–46)
Lymphs Abs: 1.4 10*3/uL (ref 0.7–4.0)
MCH: 41.6 pg — ABNORMAL HIGH (ref 26.0–34.0)
MCHC: 33.3 g/dL (ref 30.0–36.0)
MCV: 124.9 fL — ABNORMAL HIGH (ref 78.0–100.0)
MONO ABS: 0.6 10*3/uL (ref 0.1–1.0)
Monocytes Relative: 6 % (ref 3–12)
NEUTROS PCT: 74 % (ref 43–77)
Neutro Abs: 7.6 10*3/uL (ref 1.7–7.7)
Platelets: 206 10*3/uL (ref 150–400)
RBC: 2.21 MIL/uL — ABNORMAL LOW (ref 4.22–5.81)
RDW: 18.2 % — ABNORMAL HIGH (ref 11.5–15.5)
WBC: 10.2 10*3/uL (ref 4.0–10.5)

## 2014-12-06 LAB — AMMONIA: AMMONIA: 58 umol/L — AB (ref 9–35)

## 2014-12-06 MED ORDER — FUROSEMIDE 20 MG PO TABS: 10.0000 mg | ORAL_TABLET | Freq: Every day | ORAL | Status: DC

## 2014-12-06 MED ORDER — LACTULOSE 10 GM/15ML PO SOLN: 10.0000 g | Freq: Two times a day (BID) | ORAL | Status: DC

## 2014-12-06 MED ORDER — SPIRONOLACTONE 25 MG PO TABS: 25.0000 mg | ORAL_TABLET | Freq: Every day | ORAL | Status: DC

## 2014-12-06 MED ORDER — CHLORDIAZEPOXIDE HCL 25 MG PO CAPS: 25.0000 mg | ORAL_CAPSULE | Freq: Three times a day (TID) | ORAL | Status: DC

## 2014-12-06 MED ORDER — OXYCODONE HCL 5 MG PO TABS: 5.0000 mg | ORAL_TABLET | ORAL | Status: DC | PRN

## 2014-12-06 NOTE — Clinical Social Work Note (Signed)
CSW received a call for pt transport home.  CSW met with pt's family at bedside to confirm address, prepared discharge packet and called for transportation  No further CSW needs  .Dede Query, LCSW [facility] Clinical Social Worker - Weekend Coverage cell #: [phone]

## 2014-12-06 NOTE — Progress Notes (Signed)
Chart reviewed Vital signs reviewed  Patient sitting up ion bed, not hungry but ate 25% NO specific issues No n/v/cp  P As per Dr. Doristine Devoid d/c summary-stable for d/c this am   Verneita Griffes, MD [facility] (P) [phone]

## 2014-12-07 ENCOUNTER — Encounter (HOSPITAL_COMMUNITY): Payer: Self-pay

## 2014-12-07 DIAGNOSIS — W19XXXD Unspecified fall, subsequent encounter: Secondary | ICD-10-CM | POA: Diagnosis not present

## 2014-12-07 DIAGNOSIS — Z8546 Personal history of malignant neoplasm of prostate: Secondary | ICD-10-CM | POA: Diagnosis not present

## 2014-12-07 DIAGNOSIS — S2241XD Multiple fractures of ribs, right side, subsequent encounter for fracture with routine healing: Secondary | ICD-10-CM | POA: Diagnosis not present

## 2014-12-07 DIAGNOSIS — I10 Essential (primary) hypertension: Secondary | ICD-10-CM | POA: Diagnosis not present

## 2014-12-07 DIAGNOSIS — K7011 Alcoholic hepatitis with ascites: Secondary | ICD-10-CM | POA: Diagnosis not present

## 2014-12-07 DIAGNOSIS — R269 Unspecified abnormalities of gait and mobility: Secondary | ICD-10-CM | POA: Diagnosis not present

## 2014-12-07 DIAGNOSIS — I480 Paroxysmal atrial fibrillation: Secondary | ICD-10-CM | POA: Diagnosis not present

## 2014-12-07 DIAGNOSIS — Z85830 Personal history of malignant neoplasm of bone: Secondary | ICD-10-CM | POA: Diagnosis not present

## 2014-12-08 DIAGNOSIS — R946 Abnormal results of thyroid function studies: Secondary | ICD-10-CM | POA: Diagnosis not present

## 2014-12-08 DIAGNOSIS — K7030 Alcoholic cirrhosis of liver without ascites: Secondary | ICD-10-CM | POA: Diagnosis not present

## 2014-12-09 ENCOUNTER — Telehealth: Payer: Self-pay | Admitting: *Deleted

## 2014-12-09 ENCOUNTER — Telehealth: Payer: Self-pay | Admitting: Oncology

## 2014-12-09 ENCOUNTER — Encounter (HOSPITAL_COMMUNITY): Payer: Self-pay

## 2014-12-09 DIAGNOSIS — I10 Essential (primary) hypertension: Secondary | ICD-10-CM | POA: Diagnosis not present

## 2014-12-09 DIAGNOSIS — K7011 Alcoholic hepatitis with ascites: Secondary | ICD-10-CM | POA: Diagnosis not present

## 2014-12-09 DIAGNOSIS — S2241XD Multiple fractures of ribs, right side, subsequent encounter for fracture with routine healing: Secondary | ICD-10-CM | POA: Diagnosis not present

## 2014-12-09 DIAGNOSIS — R269 Unspecified abnormalities of gait and mobility: Secondary | ICD-10-CM | POA: Diagnosis not present

## 2014-12-09 DIAGNOSIS — I480 Paroxysmal atrial fibrillation: Secondary | ICD-10-CM | POA: Diagnosis not present

## 2014-12-09 DIAGNOSIS — W19XXXD Unspecified fall, subsequent encounter: Secondary | ICD-10-CM | POA: Diagnosis not present

## 2014-12-09 NOTE — Telephone Encounter (Signed)
TC from pt's wife needing to re-schedule an appt with Dr. [name].  He had an appt on 11/24/14 but he was an in-patient at the time. Please call wife  To re-schedule. Thank you.

## 2014-12-09 NOTE — Telephone Encounter (Signed)
Returned call to patient wife re r/s 4/19 appointment - patient was in the hospital. Wife given new appointment for lab/FS 5/25 @ 12:45pm

## 2014-12-11 ENCOUNTER — Encounter (HOSPITAL_COMMUNITY): Payer: Self-pay

## 2014-12-11 DIAGNOSIS — W19XXXD Unspecified fall, subsequent encounter: Secondary | ICD-10-CM | POA: Diagnosis not present

## 2014-12-11 DIAGNOSIS — K7011 Alcoholic hepatitis with ascites: Secondary | ICD-10-CM | POA: Diagnosis not present

## 2014-12-11 DIAGNOSIS — S2241XD Multiple fractures of ribs, right side, subsequent encounter for fracture with routine healing: Secondary | ICD-10-CM | POA: Diagnosis not present

## 2014-12-11 DIAGNOSIS — I10 Essential (primary) hypertension: Secondary | ICD-10-CM | POA: Diagnosis not present

## 2014-12-11 DIAGNOSIS — I480 Paroxysmal atrial fibrillation: Secondary | ICD-10-CM | POA: Diagnosis not present

## 2014-12-11 DIAGNOSIS — R269 Unspecified abnormalities of gait and mobility: Secondary | ICD-10-CM | POA: Diagnosis not present

## 2014-12-12 DIAGNOSIS — I10 Essential (primary) hypertension: Secondary | ICD-10-CM | POA: Diagnosis not present

## 2014-12-12 DIAGNOSIS — I480 Paroxysmal atrial fibrillation: Secondary | ICD-10-CM | POA: Diagnosis not present

## 2014-12-12 DIAGNOSIS — S2241XD Multiple fractures of ribs, right side, subsequent encounter for fracture with routine healing: Secondary | ICD-10-CM | POA: Diagnosis not present

## 2014-12-12 DIAGNOSIS — W19XXXD Unspecified fall, subsequent encounter: Secondary | ICD-10-CM | POA: Diagnosis not present

## 2014-12-12 DIAGNOSIS — K7011 Alcoholic hepatitis with ascites: Secondary | ICD-10-CM | POA: Diagnosis not present

## 2014-12-12 DIAGNOSIS — R269 Unspecified abnormalities of gait and mobility: Secondary | ICD-10-CM | POA: Diagnosis not present

## 2014-12-14 ENCOUNTER — Encounter (HOSPITAL_COMMUNITY): Payer: Self-pay

## 2014-12-14 DIAGNOSIS — I480 Paroxysmal atrial fibrillation: Secondary | ICD-10-CM | POA: Diagnosis not present

## 2014-12-14 DIAGNOSIS — R269 Unspecified abnormalities of gait and mobility: Secondary | ICD-10-CM | POA: Diagnosis not present

## 2014-12-14 DIAGNOSIS — W19XXXD Unspecified fall, subsequent encounter: Secondary | ICD-10-CM | POA: Diagnosis not present

## 2014-12-14 DIAGNOSIS — K7011 Alcoholic hepatitis with ascites: Secondary | ICD-10-CM | POA: Diagnosis not present

## 2014-12-14 DIAGNOSIS — I10 Essential (primary) hypertension: Secondary | ICD-10-CM | POA: Diagnosis not present

## 2014-12-14 DIAGNOSIS — S2241XD Multiple fractures of ribs, right side, subsequent encounter for fracture with routine healing: Secondary | ICD-10-CM | POA: Diagnosis not present

## 2014-12-15 DIAGNOSIS — I480 Paroxysmal atrial fibrillation: Secondary | ICD-10-CM | POA: Diagnosis not present

## 2014-12-15 DIAGNOSIS — K7011 Alcoholic hepatitis with ascites: Secondary | ICD-10-CM | POA: Diagnosis not present

## 2014-12-15 DIAGNOSIS — S2241XD Multiple fractures of ribs, right side, subsequent encounter for fracture with routine healing: Secondary | ICD-10-CM | POA: Diagnosis not present

## 2014-12-15 DIAGNOSIS — R269 Unspecified abnormalities of gait and mobility: Secondary | ICD-10-CM | POA: Diagnosis not present

## 2014-12-15 DIAGNOSIS — W19XXXD Unspecified fall, subsequent encounter: Secondary | ICD-10-CM | POA: Diagnosis not present

## 2014-12-15 DIAGNOSIS — I10 Essential (primary) hypertension: Secondary | ICD-10-CM | POA: Diagnosis not present

## 2014-12-16 ENCOUNTER — Encounter (HOSPITAL_COMMUNITY): Payer: Self-pay

## 2014-12-16 ENCOUNTER — Encounter: Payer: Self-pay | Admitting: Cardiovascular Disease

## 2014-12-16 DIAGNOSIS — K7011 Alcoholic hepatitis with ascites: Secondary | ICD-10-CM | POA: Diagnosis not present

## 2014-12-16 DIAGNOSIS — I480 Paroxysmal atrial fibrillation: Secondary | ICD-10-CM | POA: Diagnosis not present

## 2014-12-16 DIAGNOSIS — R269 Unspecified abnormalities of gait and mobility: Secondary | ICD-10-CM | POA: Diagnosis not present

## 2014-12-16 DIAGNOSIS — I10 Essential (primary) hypertension: Secondary | ICD-10-CM | POA: Diagnosis not present

## 2014-12-16 DIAGNOSIS — W19XXXD Unspecified fall, subsequent encounter: Secondary | ICD-10-CM | POA: Diagnosis not present

## 2014-12-16 DIAGNOSIS — S2241XD Multiple fractures of ribs, right side, subsequent encounter for fracture with routine healing: Secondary | ICD-10-CM | POA: Diagnosis not present

## 2014-12-17 DIAGNOSIS — I10 Essential (primary) hypertension: Secondary | ICD-10-CM | POA: Diagnosis not present

## 2014-12-17 DIAGNOSIS — K7011 Alcoholic hepatitis with ascites: Secondary | ICD-10-CM | POA: Diagnosis not present

## 2014-12-17 DIAGNOSIS — S2241XD Multiple fractures of ribs, right side, subsequent encounter for fracture with routine healing: Secondary | ICD-10-CM | POA: Diagnosis not present

## 2014-12-17 DIAGNOSIS — R269 Unspecified abnormalities of gait and mobility: Secondary | ICD-10-CM | POA: Diagnosis not present

## 2014-12-17 DIAGNOSIS — W19XXXD Unspecified fall, subsequent encounter: Secondary | ICD-10-CM | POA: Diagnosis not present

## 2014-12-17 DIAGNOSIS — I480 Paroxysmal atrial fibrillation: Secondary | ICD-10-CM | POA: Diagnosis not present

## 2014-12-18 ENCOUNTER — Encounter (HOSPITAL_COMMUNITY): Payer: Self-pay

## 2014-12-18 ENCOUNTER — Telehealth: Payer: Self-pay | Admitting: *Deleted

## 2014-12-18 DIAGNOSIS — R269 Unspecified abnormalities of gait and mobility: Secondary | ICD-10-CM | POA: Diagnosis not present

## 2014-12-18 DIAGNOSIS — I10 Essential (primary) hypertension: Secondary | ICD-10-CM | POA: Diagnosis not present

## 2014-12-18 DIAGNOSIS — S2241XD Multiple fractures of ribs, right side, subsequent encounter for fracture with routine healing: Secondary | ICD-10-CM | POA: Diagnosis not present

## 2014-12-18 DIAGNOSIS — I480 Paroxysmal atrial fibrillation: Secondary | ICD-10-CM | POA: Diagnosis not present

## 2014-12-18 DIAGNOSIS — K7011 Alcoholic hepatitis with ascites: Secondary | ICD-10-CM | POA: Diagnosis not present

## 2014-12-18 DIAGNOSIS — W19XXXD Unspecified fall, subsequent encounter: Secondary | ICD-10-CM | POA: Diagnosis not present

## 2014-12-18 NOTE — Telephone Encounter (Signed)
PHYSICAL  THERAPY SUGGESTED A TENS UNIT TO ASSIST WITH PT.'S RIB PAIN SO HE CAN SLEEP AT NIGHT. IS IT OK WITH DR.[name] FOR PT. TO USE THE TENS UNIT?

## 2014-12-20 DIAGNOSIS — K7011 Alcoholic hepatitis with ascites: Secondary | ICD-10-CM | POA: Diagnosis not present

## 2014-12-20 DIAGNOSIS — I10 Essential (primary) hypertension: Secondary | ICD-10-CM | POA: Diagnosis not present

## 2014-12-20 DIAGNOSIS — S2241XD Multiple fractures of ribs, right side, subsequent encounter for fracture with routine healing: Secondary | ICD-10-CM | POA: Diagnosis not present

## 2014-12-20 DIAGNOSIS — W19XXXD Unspecified fall, subsequent encounter: Secondary | ICD-10-CM | POA: Diagnosis not present

## 2014-12-20 DIAGNOSIS — I480 Paroxysmal atrial fibrillation: Secondary | ICD-10-CM | POA: Diagnosis not present

## 2014-12-20 DIAGNOSIS — R269 Unspecified abnormalities of gait and mobility: Secondary | ICD-10-CM | POA: Diagnosis not present

## 2014-12-21 ENCOUNTER — Inpatient Hospital Stay (HOSPITAL_COMMUNITY)
Admission: EM | Admit: 2014-12-21 | Discharge: 2014-12-29 | DRG: 871 | Disposition: A | Discharge status: Home Health | Payer: Medicare Other | Attending: Internal Medicine | Admitting: Internal Medicine

## 2014-12-21 ENCOUNTER — Encounter (HOSPITAL_COMMUNITY): Payer: Self-pay

## 2014-12-21 ENCOUNTER — Emergency Department (HOSPITAL_COMMUNITY): Payer: Medicare Other

## 2014-12-21 ENCOUNTER — Other Ambulatory Visit (HOSPITAL_COMMUNITY): Payer: Self-pay

## 2014-12-21 ENCOUNTER — Encounter (HOSPITAL_COMMUNITY): Payer: Self-pay | Admitting: *Deleted

## 2014-12-21 DIAGNOSIS — A419 Sepsis, unspecified organism: Secondary | ICD-10-CM

## 2014-12-21 DIAGNOSIS — M545 Low back pain, unspecified: Secondary | ICD-10-CM | POA: Insufficient documentation

## 2014-12-21 DIAGNOSIS — I2510 Atherosclerotic heart disease of native coronary artery without angina pectoris: Secondary | ICD-10-CM | POA: Diagnosis present

## 2014-12-21 DIAGNOSIS — J9601 Acute respiratory failure with hypoxia: Secondary | ICD-10-CM | POA: Diagnosis present

## 2014-12-21 DIAGNOSIS — C7951 Secondary malignant neoplasm of bone: Secondary | ICD-10-CM | POA: Diagnosis present

## 2014-12-21 DIAGNOSIS — R4182 Altered mental status, unspecified: Secondary | ICD-10-CM | POA: Diagnosis not present

## 2014-12-21 DIAGNOSIS — R509 Fever, unspecified: Secondary | ICD-10-CM

## 2014-12-21 DIAGNOSIS — K7290 Hepatic failure, unspecified without coma: Secondary | ICD-10-CM | POA: Insufficient documentation

## 2014-12-21 DIAGNOSIS — E871 Hypo-osmolality and hyponatremia: Secondary | ICD-10-CM | POA: Diagnosis present

## 2014-12-21 DIAGNOSIS — R410 Disorientation, unspecified: Secondary | ICD-10-CM

## 2014-12-21 DIAGNOSIS — G893 Neoplasm related pain (acute) (chronic): Secondary | ICD-10-CM | POA: Diagnosis present

## 2014-12-21 DIAGNOSIS — B9620 Unspecified Escherichia coli [E. coli] as the cause of diseases classified elsewhere: Secondary | ICD-10-CM | POA: Insufficient documentation

## 2014-12-21 DIAGNOSIS — R296 Repeated falls: Secondary | ICD-10-CM | POA: Diagnosis present

## 2014-12-21 DIAGNOSIS — D684 Acquired coagulation factor deficiency: Secondary | ICD-10-CM | POA: Diagnosis present

## 2014-12-21 DIAGNOSIS — I4891 Unspecified atrial fibrillation: Secondary | ICD-10-CM | POA: Diagnosis present

## 2014-12-21 DIAGNOSIS — B9689 Other specified bacterial agents as the cause of diseases classified elsewhere: Secondary | ICD-10-CM | POA: Diagnosis present

## 2014-12-21 DIAGNOSIS — Z66 Do not resuscitate: Secondary | ICD-10-CM | POA: Diagnosis present

## 2014-12-21 DIAGNOSIS — R188 Other ascites: Secondary | ICD-10-CM | POA: Diagnosis not present

## 2014-12-21 DIAGNOSIS — Z955 Presence of coronary angioplasty implant and graft: Secondary | ICD-10-CM | POA: Diagnosis not present

## 2014-12-21 DIAGNOSIS — G9340 Encephalopathy, unspecified: Secondary | ICD-10-CM | POA: Insufficient documentation

## 2014-12-21 DIAGNOSIS — R278 Other lack of coordination: Secondary | ICD-10-CM | POA: Diagnosis present

## 2014-12-21 DIAGNOSIS — D619 Aplastic anemia, unspecified: Secondary | ICD-10-CM | POA: Diagnosis present

## 2014-12-21 DIAGNOSIS — Z789 Other specified health status: Secondary | ICD-10-CM | POA: Diagnosis not present

## 2014-12-21 DIAGNOSIS — R6520 Severe sepsis without septic shock: Secondary | ICD-10-CM | POA: Diagnosis present

## 2014-12-21 DIAGNOSIS — E872 Acidosis: Secondary | ICD-10-CM | POA: Diagnosis present

## 2014-12-21 DIAGNOSIS — A4151 Sepsis due to Escherichia coli [E. coli]: Secondary | ICD-10-CM | POA: Diagnosis not present

## 2014-12-21 DIAGNOSIS — Z8249 Family history of ischemic heart disease and other diseases of the circulatory system: Secondary | ICD-10-CM

## 2014-12-21 DIAGNOSIS — Z800 Family history of malignant neoplasm of digestive organs: Secondary | ICD-10-CM

## 2014-12-21 DIAGNOSIS — R7881 Bacteremia: Secondary | ICD-10-CM | POA: Diagnosis not present

## 2014-12-21 DIAGNOSIS — Y92129 Unspecified place in nursing home as the place of occurrence of the external cause: Secondary | ICD-10-CM | POA: Diagnosis not present

## 2014-12-21 DIAGNOSIS — K7031 Alcoholic cirrhosis of liver with ascites: Secondary | ICD-10-CM | POA: Diagnosis present

## 2014-12-21 DIAGNOSIS — K7011 Alcoholic hepatitis with ascites: Secondary | ICD-10-CM | POA: Diagnosis not present

## 2014-12-21 DIAGNOSIS — E876 Hypokalemia: Secondary | ICD-10-CM | POA: Diagnosis not present

## 2014-12-21 DIAGNOSIS — F411 Generalized anxiety disorder: Secondary | ICD-10-CM | POA: Diagnosis present

## 2014-12-21 DIAGNOSIS — R627 Adult failure to thrive: Secondary | ICD-10-CM | POA: Diagnosis not present

## 2014-12-21 DIAGNOSIS — E785 Hyperlipidemia, unspecified: Secondary | ICD-10-CM | POA: Diagnosis present

## 2014-12-21 DIAGNOSIS — S2241XS Multiple fractures of ribs, right side, sequela: Secondary | ICD-10-CM

## 2014-12-21 DIAGNOSIS — F1020 Alcohol dependence, uncomplicated: Secondary | ICD-10-CM | POA: Diagnosis present

## 2014-12-21 DIAGNOSIS — J9811 Atelectasis: Secondary | ICD-10-CM | POA: Diagnosis not present

## 2014-12-21 DIAGNOSIS — W19XXXD Unspecified fall, subsequent encounter: Secondary | ICD-10-CM | POA: Diagnosis not present

## 2014-12-21 DIAGNOSIS — K7682 Hepatic encephalopathy: Secondary | ICD-10-CM | POA: Insufficient documentation

## 2014-12-21 DIAGNOSIS — Z515 Encounter for palliative care: Secondary | ICD-10-CM | POA: Diagnosis not present

## 2014-12-21 DIAGNOSIS — R269 Unspecified abnormalities of gait and mobility: Secondary | ICD-10-CM | POA: Diagnosis not present

## 2014-12-21 DIAGNOSIS — C801 Malignant (primary) neoplasm, unspecified: Secondary | ICD-10-CM | POA: Diagnosis not present

## 2014-12-21 DIAGNOSIS — D638 Anemia in other chronic diseases classified elsewhere: Secondary | ICD-10-CM | POA: Diagnosis present

## 2014-12-21 DIAGNOSIS — W1830XS Fall on same level, unspecified, sequela: Secondary | ICD-10-CM | POA: Diagnosis present

## 2014-12-21 DIAGNOSIS — F4489 Other dissociative and conversion disorders: Secondary | ICD-10-CM | POA: Diagnosis not present

## 2014-12-21 DIAGNOSIS — Z8042 Family history of malignant neoplasm of prostate: Secondary | ICD-10-CM

## 2014-12-21 DIAGNOSIS — I10 Essential (primary) hypertension: Secondary | ICD-10-CM | POA: Diagnosis present

## 2014-12-21 DIAGNOSIS — I6789 Other cerebrovascular disease: Secondary | ICD-10-CM | POA: Diagnosis not present

## 2014-12-21 DIAGNOSIS — I480 Paroxysmal atrial fibrillation: Secondary | ICD-10-CM | POA: Diagnosis present

## 2014-12-21 DIAGNOSIS — K7010 Alcoholic hepatitis without ascites: Secondary | ICD-10-CM | POA: Diagnosis not present

## 2014-12-21 DIAGNOSIS — C61 Malignant neoplasm of prostate: Secondary | ICD-10-CM | POA: Diagnosis present

## 2014-12-21 DIAGNOSIS — S2241XD Multiple fractures of ribs, right side, subsequent encounter for fracture with routine healing: Secondary | ICD-10-CM | POA: Diagnosis not present

## 2014-12-21 DIAGNOSIS — K7040 Alcoholic hepatic failure without coma: Secondary | ICD-10-CM | POA: Diagnosis present

## 2014-12-21 DIAGNOSIS — Z87891 Personal history of nicotine dependence: Secondary | ICD-10-CM

## 2014-12-21 LAB — CBC
HEMATOCRIT: 32.2 % — AB (ref 39.0–52.0)
Hemoglobin: 10.8 g/dL — ABNORMAL LOW (ref 13.0–17.0)
MCH: 40.6 pg — AB (ref 26.0–34.0)
MCHC: 33.5 g/dL (ref 30.0–36.0)
MCV: 121.1 fL — AB (ref 78.0–100.0)
Platelets: 241 10*3/uL (ref 150–400)
RBC: 2.66 MIL/uL — ABNORMAL LOW (ref 4.22–5.81)
RDW: 15.2 % (ref 11.5–15.5)
WBC: 14.9 10*3/uL — ABNORMAL HIGH (ref 4.0–10.5)

## 2014-12-21 LAB — COMPREHENSIVE METABOLIC PANEL
ALBUMIN: 2.7 g/dL — AB (ref 3.5–5.0)
ALT: 41 U/L (ref 17–63)
ALT: 32 U/L (ref 17–63)
ANION GAP: 8 (ref 5–15)
AST: 104 U/L — AB (ref 15–41)
AST: 83 U/L — ABNORMAL HIGH (ref 15–41)
Albumin: 2.3 g/dL — ABNORMAL LOW (ref 3.5–5.0)
Alkaline Phosphatase: 150 U/L — ABNORMAL HIGH (ref 38–126)
Alkaline Phosphatase: 117 U/L (ref 38–126)
Anion gap: 6 (ref 5–15)
BILIRUBIN TOTAL: 5.3 mg/dL — AB (ref 0.3–1.2)
BILIRUBIN TOTAL: 4.8 mg/dL — AB (ref 0.3–1.2)
BUN: 16 mg/dL (ref 6–20)
BUN: 15 mg/dL (ref 6–20)
CALCIUM: 8.2 mg/dL — AB (ref 8.9–10.3)
CALCIUM: 7.4 mg/dL — AB (ref 8.9–10.3)
CHLORIDE: 106 mmol/L (ref 101–111)
CO2: 23 mmol/L (ref 22–32)
CO2: 19 mmol/L — ABNORMAL LOW (ref 22–32)
Chloride: 98 mmol/L — ABNORMAL LOW (ref 101–111)
Creatinine, Ser: 1.13 mg/dL (ref 0.61–1.24)
Creatinine, Ser: 1.03 mg/dL (ref 0.61–1.24)
GFR calc Af Amer: >60 mL/min (ref >60)
GFR calc non Af Amer: >60 mL/min (ref >60)
Glucose, Bld: 92 mg/dL (ref 65–99)
Glucose, Bld: 99 mg/dL (ref 65–99)
POTASSIUM: 4.2 mmol/L (ref 3.5–5.1)
Potassium: 3.6 mmol/L (ref 3.5–5.1)
Sodium: 127 mmol/L — ABNORMAL LOW (ref 135–145)
Sodium: 133 mmol/L — ABNORMAL LOW (ref 135–145)
Total Protein: 8 g/dL (ref 6.5–8.1)
Total Protein: 6.4 g/dL — ABNORMAL LOW (ref 6.5–8.1)

## 2014-12-21 LAB — TSH: TSH: 1.5 u[IU]/mL (ref 0.350–4.500)

## 2014-12-21 LAB — I-STAT CG4 LACTIC ACID, ED
LACTIC ACID, VENOUS: 2.41 mmol/L — AB (ref 0.5–2.0)
LACTIC ACID, VENOUS: 0.95 mmol/L (ref 0.5–2.0)

## 2014-12-21 LAB — CBC WITH DIFFERENTIAL/PLATELET
Basophils Absolute: 0 K/uL (ref 0.0–0.1)
Basophils Relative: 0 % (ref 0–1)
Eosinophils Absolute: 0 K/uL (ref 0.0–0.7)
Eosinophils Relative: 0 % (ref 0–5)
HCT: 27.6 % — ABNORMAL LOW (ref 39.0–52.0)
Hemoglobin: 9.2 g/dL — ABNORMAL LOW (ref 13.0–17.0)
Lymphocytes Relative: 7 % — ABNORMAL LOW (ref 12–46)
Lymphs Abs: 1 K/uL (ref 0.7–4.0)
MCH: 40.7 pg — ABNORMAL HIGH (ref 26.0–34.0)
MCHC: 33.3 g/dL (ref 30.0–36.0)
MCV: 122.1 fL — ABNORMAL HIGH (ref 78.0–100.0)
Monocytes Absolute: 0.8 K/uL (ref 0.1–1.0)
Monocytes Relative: 6 % (ref 3–12)
Neutro Abs: 12.3 K/uL — ABNORMAL HIGH (ref 1.7–7.7)
Neutrophils Relative %: 87 % — ABNORMAL HIGH (ref 43–77)
Platelets: 202 K/uL (ref 150–400)
RBC: 2.26 MIL/uL — ABNORMAL LOW (ref 4.22–5.81)
RDW: 15.4 % (ref 11.5–15.5)
WBC: 14.1 K/uL — ABNORMAL HIGH (ref 4.0–10.5)

## 2014-12-21 LAB — URINALYSIS, ROUTINE W REFLEX MICROSCOPIC
BILIRUBIN URINE: NEGATIVE
GLUCOSE, UA: NEGATIVE mg/dL
Ketones, ur: NEGATIVE mg/dL
Nitrite: POSITIVE — AB
PROTEIN: 30 mg/dL — AB
Specific Gravity, Urine: 1.01 (ref 1.005–1.030)
Urobilinogen, UA: 0.2 mg/dL (ref 0.0–1.0)
pH: 6 (ref 5.0–8.0)

## 2014-12-21 LAB — URINE MICROSCOPIC-ADD ON

## 2014-12-21 LAB — AMMONIA: Ammonia: 41 umol/L — ABNORMAL HIGH (ref 9–35)

## 2014-12-21 LAB — PROTIME-INR
INR: 1.57 — ABNORMAL HIGH (ref 0.00–1.49)
PROTHROMBIN TIME: 18.9 s — AB (ref 11.6–15.2)

## 2014-12-21 LAB — LACTIC ACID, PLASMA: Lactic Acid, Venous: 1.2 mmol/L (ref 0.5–2.0)

## 2014-12-21 LAB — APTT: APTT: 30 s (ref 24–37)

## 2014-12-21 MED ORDER — DEXTROSE 5 % IV SOLN
2.0000 g | Freq: Once | INTRAVENOUS | Status: AC
  Administered 2014-12-21: 2 g via INTRAVENOUS
  Filled 2014-12-21: qty 2

## 2014-12-21 MED ORDER — OXYCODONE HCL 5 MG PO TABS
2.5000 mg | ORAL_TABLET | Freq: Three times a day (TID) | ORAL | Status: DC | PRN
  Filled 2014-12-21: qty 1

## 2014-12-21 MED ORDER — VANCOMYCIN HCL 10 G IV SOLR
1250.0000 mg | Freq: Two times a day (BID) | INTRAVENOUS | Status: DC
  Administered 2014-12-22: 1250 mg via INTRAVENOUS
  Filled 2014-12-21 (×2): qty 1250

## 2014-12-21 MED ORDER — IBUPROFEN 800 MG PO TABS
800.0000 mg | ORAL_TABLET | Freq: Once | ORAL | Status: AC
  Administered 2014-12-21: 800 mg via ORAL
  Filled 2014-12-21: qty 1

## 2014-12-21 MED ORDER — ADULT MULTIVITAMIN W/MINERALS CH
1.0000 | ORAL_TABLET | Freq: Every day | ORAL | Status: DC
  Administered 2014-12-21 – 2014-12-29 (×9): 1 via ORAL
  Filled 2014-12-21 (×10): qty 1

## 2014-12-21 MED ORDER — PIPERACILLIN-TAZOBACTAM 3.375 G IVPB 30 MIN
3.3750 g | Freq: Once | INTRAVENOUS | Status: DC
Start: 2014-12-21 — End: 2014-12-21

## 2014-12-21 MED ORDER — SODIUM CHLORIDE 0.9 % IV BOLUS (SEPSIS)
1000.0000 mL | INTRAVENOUS | Status: AC
  Administered 2014-12-21 (×3): 1000 mL via INTRAVENOUS

## 2014-12-21 MED ORDER — SODIUM CHLORIDE 0.9 % IV BOLUS (SEPSIS)
1000.0000 mL | Freq: Once | INTRAVENOUS | Status: AC
  Administered 2014-12-21: 1000 mL via INTRAVENOUS

## 2014-12-21 MED ORDER — ONDANSETRON HCL 4 MG PO TABS: 4.0000 mg | ORAL_TABLET | Freq: Four times a day (QID) | ORAL | Status: DC | PRN

## 2014-12-21 MED ORDER — LACTULOSE 10 GM/15ML PO SOLN
30.0000 g | Freq: Once | ORAL | Status: AC
  Administered 2014-12-21: 30 g via ORAL
  Filled 2014-12-21: qty 45

## 2014-12-21 MED ORDER — ALBUTEROL SULFATE (2.5 MG/3ML) 0.083% IN NEBU
5.0000 mg | INHALATION_SOLUTION | Freq: Once | RESPIRATORY_TRACT | Status: AC
  Administered 2014-12-21: 5 mg via RESPIRATORY_TRACT
  Filled 2014-12-21: qty 6

## 2014-12-21 MED ORDER — DEXTROSE 5 % IV SOLN
1.0000 g | Freq: Three times a day (TID) | INTRAVENOUS | Status: DC
  Filled 2014-12-21: qty 1

## 2014-12-21 MED ORDER — ONDANSETRON HCL 4 MG/2ML IJ SOLN: 4.0000 mg | Freq: Four times a day (QID) | INTRAMUSCULAR | Status: DC | PRN

## 2014-12-21 MED ORDER — PIPERACILLIN-TAZOBACTAM 3.375 G IVPB
3.3750 g | Freq: Three times a day (TID) | INTRAVENOUS | Status: DC
  Administered 2014-12-22 – 2014-12-24 (×7): 3.375 g via INTRAVENOUS
  Filled 2014-12-21 (×7): qty 50

## 2014-12-21 MED ORDER — VANCOMYCIN HCL IN DEXTROSE 1-5 GM/200ML-% IV SOLN: 1000.0000 mg | Freq: Once | INTRAVENOUS | Status: DC

## 2014-12-21 MED ORDER — VITAMIN B-1 100 MG PO TABS
100.0000 mg | ORAL_TABLET | Freq: Every day | ORAL | Status: DC
  Administered 2014-12-21 – 2014-12-29 (×9): 100 mg via ORAL
  Filled 2014-12-21 (×9): qty 1

## 2014-12-21 MED ORDER — FOLIC ACID 1 MG PO TABS
1.0000 mg | ORAL_TABLET | Freq: Every day | ORAL | Status: DC
  Administered 2014-12-21 – 2014-12-29 (×9): 1 mg via ORAL
  Filled 2014-12-21 (×9): qty 1

## 2014-12-21 MED ORDER — VANCOMYCIN HCL IN DEXTROSE 1-5 GM/200ML-% IV SOLN
1000.0000 mg | Freq: Once | INTRAVENOUS | Status: AC
  Administered 2014-12-21: 1000 mg via INTRAVENOUS
  Filled 2014-12-21: qty 200

## 2014-12-21 MED ORDER — SODIUM CHLORIDE 0.9 % IV SOLN
1250.0000 mg | Freq: Two times a day (BID) | INTRAVENOUS | Status: DC
  Filled 2014-12-21: qty 1250

## 2014-12-21 MED ORDER — OXYCODONE HCL 5 MG PO TABS
5.0000 mg | ORAL_TABLET | ORAL | Status: DC | PRN
  Administered 2014-12-22 – 2014-12-23 (×3): 5 mg via ORAL
  Filled 2014-12-21 (×2): qty 1

## 2014-12-21 MED ORDER — SODIUM CHLORIDE 0.9 % IJ SOLN
3.0000 mL | Freq: Two times a day (BID) | INTRAMUSCULAR | Status: DC
  Administered 2014-12-21 – 2014-12-29 (×10): 3 mL via INTRAVENOUS

## 2014-12-21 MED ORDER — DOCUSATE SODIUM 100 MG PO CAPS
100.0000 mg | ORAL_CAPSULE | Freq: Two times a day (BID) | ORAL | Status: DC
  Administered 2014-12-21 – 2014-12-23 (×2): 100 mg via ORAL
  Filled 2014-12-21 (×2): qty 1

## 2014-12-21 MED ORDER — LACTULOSE 10 GM/15ML PO SOLN
10.0000 g | Freq: Three times a day (TID) | ORAL | Status: DC
  Administered 2014-12-21 – 2014-12-26 (×14): 10 g via ORAL
  Filled 2014-12-21 (×4): qty 15
  Filled 2014-12-21: qty 30
  Filled 2014-12-21: qty 15
  Filled 2014-12-21 (×2): qty 30
  Filled 2014-12-21 (×7): qty 15

## 2014-12-21 MED ORDER — SODIUM CHLORIDE 0.9 % IV BOLUS (SEPSIS)
500.0000 mL | Freq: Once | INTRAVENOUS | Status: AC
  Administered 2014-12-21: 500 mL via INTRAVENOUS

## 2014-12-21 MED ORDER — SODIUM CHLORIDE 0.9 % IV SOLN
INTRAVENOUS | Status: DC
  Administered 2014-12-21: 23:00:00 via INTRAVENOUS

## 2014-12-21 NOTE — ED Notes (Signed)
Bed: TM62 Expected date:  Expected time:  Means of arrival:  Comments: EMS/87F/fever/AMS

## 2014-12-21 NOTE — Progress Notes (Addendum)
ANTIBIOTIC CONSULT NOTE - INITIAL  Pharmacy Consult for vancomycin/Cefepime --> zosyn Indication: pneumonia  No Known Allergies  Patient Measurements:   Adjusted Body Weight:   Vital Signs:   Intake/Output from previous day:   Intake/Output from this shift:    Labs: No results for input(s): WBC, HGB, PLT, LABCREA, CREATININE in the last 72 hours. CrCl cannot be calculated (Unknown ideal weight.). No results for input(s): VANCOTROUGH, VANCOPEAK, VANCORANDOM, GENTTROUGH, GENTPEAK, GENTRANDOM, TOBRATROUGH, TOBRAPEAK, TOBRARND, AMIKACINPEAK, AMIKACINTROU, AMIKACIN in the last 72 hours.   Microbiology: No results found for this or any previous visit (from the past 720 hour(s)).  Medical History: Past Medical History  Diagnosis Date  . Atrial fibrillation   . Other and unspecified hyperlipidemia   . Unspecified essential hypertension   . Malignant neoplasm of prostate 11/2007    Mets to Bone  . Elevated prostate specific antigen (PSA)   . Personal history of colonic polyps   . Hypocalcemia   . Acute alcoholic hepatitis   . Essential and other specified forms of tremor   . Anxiety state, unspecified   . Family history of malignant neoplasm of gastrointestinal tract   . Alcohol abuse with physiological dependence 02/13/2012  . 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 Alcoholic hepatitis with ascites 11/13/2014   Assessment: 58 YOM presents AMS changes, fever and recents falls.  He was recently discharged from [facility] on 4/30 following fall and broken ribs.  Orders for pharmacy to dose vancomycin and cefepime for HCAP. LFTs and Tbili remain elevated (elevated at April admission), thought to be secondary to EtoHic hepatitis.   WBC WNL, SCr WNL, + fever (norm CrCl = 44ml/min)  5/16 >> vanco >> 5/16 >> Cefepime >> 5/16 5/16 >> pip/tazo >>  5/16 Blood:  5/16 Urine:   Goal of Therapy:   Vancomycin trough level 15-20 mcg/ml  Plan:   Vancomycin 1gm IV x 1 then 1250mg  IV q12h  Check vancomycin trough if remains on vancomycin > 3 days  Follow renal function  Cefepime 2gm IV x 1 then 1gm IV q8h  Doreene Eland, PharmD, BCPS.   Pager: [phone] 12/21/2014,4:54 PM  Addendum:  Admission orders have cefepime changed to zosyn, start zosyn 3.375gm IV q8h over 4h infusion starting when next dose of cefepime was to be due.  Doreene Eland, PharmD, BCPS.   Pager: [phone] 12/21/2014 9:23 PM

## 2014-12-21 NOTE — ED Notes (Signed)
Prostate ca.  [place] a month ago, fell again on friday 102.4 fever today home Altered mental status Knows name 98/56 last bp, pulse 86 Hepatitis, jaundice

## 2014-12-21 NOTE — Progress Notes (Signed)
Spoke with Dr. Humphrey Rolls regarding patient's pending admission to floor around 20:00 because he appeared to be septic and more appropriate for step down.  He informed me that patient's blood pressure was responding to fluid boluses in the ED, that there were no step down beds, and that patient would be ok to come to the floor.  On arrival to floor patient's blood pressure  Was 83/42 at it's lowest, paged Dr. Humphrey Rolls to inform him of patient's blood pressure.  Order received to give a 500cc fluid bolus and to recheck blood pressure.  Notified by house [facility] that patient will be transferring to step down.  Will continue to monitor while patient is on the floor.  [place], Anaia Frith Cherie

## 2014-12-21 NOTE — H&P (Signed)
[facility] History and Physical  Allen Schroeder HCW:[id number] DOB: 1951-02-20 DOA: 12/21/2014  Referring physician: Noemi Chapel, MD PCP: [name], MD   Chief Complaint: Altered Mental Status  HPI: Allen Schroeder is a 64 y.o. male with cirrhosis of the liver with alcohol abuse, atrial fibrillation, metastatic prostate cancer and HTN presents to the ED with altered mental status. He was last noted to be his normal state over the weekend. His wife states that he was working with OT on Sunday. She states that he was more confused this afternoon around 230 PM. Patient was working with the therapist and she noted a fever of 102.23F around 315PM. He was afebrile this am. He has had occasional cough. He has been clearing out his lung and it appears not to be like a sick cough. He denies having any abdominal pain but the wife notes that his abdomen is more swollen than he was on the last admission. He was discharged to a facility in April and during that stay he fell and had possible rib fractures. He was discharged on May 1st to home. His wife states that he seems to be less confused now than  He was earlier this afternoon. Patient has been on pain meds and was recently taken off librium last week. Patient has been told not to take tylenol and aleve for pain.   Review of Systems:  Constitutional:  +weight loss, no night sweats, +Fevers, no chills, +fatigue.  HEENT:  Minor headaches, +Sore throat, No sneezing, itching  Cardio-vascular:  No chest pain, Orthopnea, no swelling in lower extremities +dizziness  GI:  No heartburn, indigestion, abdominal pain, nausea, vomiting, +diarrhea  Resp:  No shortness of breath. no productive cough, No coughing up of blood.  Skin:  +rash GU:  no dysuria, no urgency or frequency.  Musculoskeletal:  No joint pain or swelling. No decreased range of motion Psych:  +depression. A little memory loss.   Past Medical History  Diagnosis Date  . Atrial  fibrillation   . Other and unspecified hyperlipidemia   . Unspecified essential hypertension   . Malignant neoplasm of prostate 11/2007    Mets to Bone  . Elevated prostate specific antigen (PSA)   . Personal history of colonic polyps   . Hypocalcemia   . Acute alcoholic hepatitis   . Essential and other specified forms of tremor   . Anxiety state, unspecified   . Family history of malignant neoplasm of gastrointestinal tract   . Alcohol abuse with physiological dependence 02/13/2012  . 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 Alcoholic hepatitis with ascites 11/13/2014   Past Surgical History  Procedure Laterality Date  . Vasectomy    . Cholecystectomy    . Coronary stent placement  2011  . Colonoscopy w/ biopsies     Social History:  reports that he has quit smoking. His smoking use included Cigarettes. He has never used smokeless tobacco. He reports that he drinks alcohol. He reports that he does not use illicit drugs.  No Known Allergies  Family History  Problem Relation Age of Onset  . Prostate cancer Father   . Coronary artery disease Father   . Colon cancer Father   . Aneurysm Mother      Prior to Admission medications   Medication Sig Start Date End Date Taking? Authorizing Provider  CALCIUM CARB-CHOLECALCIFEROL PO Take 1 capsule by mouth 2 (two)  times daily.    Yes Historical Provider, MD  clopidogrel (PLAVIX) 75 MG tablet TAKE ONE TABLET BY MOUTH ONCE DAILY Patient taking differently: Take 75 mg by mouth daily.  06/22/14  Yes Sherren Mocha, MD  folic acid (FOLVITE) 1 MG tablet Take one tablet by mouth once a day.... Patient taking differently: Take 1 mg by mouth daily.  06/22/14  Yes Sherren Mocha, MD  furosemide (LASIX) 20 MG tablet Take 0.5 tablets (10 mg total) by mouth daily. 12/06/14  Yes Nita Sells, MD  lactulose (CHRONULAC) 10 GM/15ML solution Take 15 mLs (10 g total)  by mouth 2 (two) times daily. Patient taking differently: Take 10 g by mouth 3 (three) times daily.  12/06/14  Yes Nita Sells, MD  Multiple Vitamin (MULTIVITAMIN) tablet Take 1 tablet by mouth daily.     Yes Historical Provider, MD  oxyCODONE (OXY IR/ROXICODONE) 5 MG immediate release tablet Take 1 tablet (5 mg total) by mouth every 4 (four) hours as needed for moderate pain. Patient taking differently: Take 2.5-5 mg by mouth 3 (three) times daily as needed for severe pain (pain).  12/06/14  Yes Nita Sells, MD  potassium chloride SA (K-DUR,KLOR-CON) 20 MEQ tablet Take 1 tablet (20 mEq total) by mouth 2 (two) times daily. 11/13/14  Yes Gatha Mayer, MD  sotalol (BETAPACE) 120 MG tablet Take 120 mg by mouth daily.  11/10/14  Yes Historical Provider, MD  spironolactone (ALDACTONE) 25 MG tablet Take 1 tablet (25 mg total) by mouth daily. 12/06/14  Yes Nita Sells, MD  chlordiazePOXIDE (LIBRIUM) 25 MG capsule Take 1 capsule (25 mg total) by mouth 3 (three) times daily. Patient not taking: Reported on 12/21/2014 12/06/14   Nita Sells, MD  sotalol (BETAPACE) 80 MG tablet Take 1 tablet (80 mg total) by mouth daily. T Patient not taking: Reported on 12/21/2014 11/24/14   Theodis Blaze, MD  UNKNOWN TO PATIENT Slow release hormone implant placed 8/13 by Dr. Willette Cluster.  Good for one year    Historical Provider, MD   Physical Exam: Filed Vitals:   12/21/14 1719 12/21/14 1731 12/21/14 1900 12/21/14 1916  BP:  128/58 91/46 91/46   Pulse:  86  76  Temp:    101.6 F (38.7 C)  TempSrc:    Oral  Resp:  25 18 22   Height: 6\' 2"  (1.88 m)     Weight: 92.08 kg (203 lb)     SpO2:  95%  92%    Wt Readings from Last 3 Encounters:  12/21/14 92.08 kg (203 lb)  12/03/14 93.3 kg (205 lb 11 oz)  11/24/14 96.934 kg (213 lb 11.2 oz)    General:  Appears calm and comfortable Eyes: PERRL, normal lids, irises & conjunctiva ENT: grossly normal hearing, lips & tongue Neck: no LAD, masses or  thyromegaly Cardiovascular: RRR, no m/r/g. No LE edema. Respiratory: CTA bilaterally, no w/r/r. Normal respiratory effort. Abdomen: soft, distended non-tender Skin: +rash seen on limited exam Musculoskeletal: grossly normal tone BUE/BLE Psychiatric: speech fluent but slow Neurologic: grossly non-focal moves all 4 extremities gait was not checked          Labs on Admission:  Basic Metabolic Panel:  Recent Labs Lab 12/21/14 1706  NA 127*  K 4.2  CL 98*  CO2 23  GLUCOSE 92  BUN 16  CREATININE 1.13  CALCIUM 8.2*   Liver Function Tests:  Recent Labs Lab 12/21/14 1706  AST 104*  ALT 41  ALKPHOS 150*  BILITOT 5.3*  PROT  8.0  ALBUMIN 2.7*   No results for input(s): LIPASE, AMYLASE in the last 168 hours.  Recent Labs Lab 12/21/14 1705  AMMONIA 41*   CBC:  Recent Labs Lab 12/21/14 1706  WBC 14.9*  HGB 10.8*  HCT 32.2*  MCV 121.1*  PLT 241   Cardiac Enzymes: No results for input(s): CKTOTAL, CKMB, CKMBINDEX, TROPONINI in the last 168 hours.  BNP (last 3 results) No results for input(s): BNP in the last 8760 hours.  ProBNP (last 3 results) No results for input(s): PROBNP in the last 8760 hours.  CBG: No results for input(s): GLUCAP in the last 168 hours.  Radiological Exams on Admission: Dg Chest Port 1 View  12/21/2014   CLINICAL DATA:  Altered mental status.  Fever.  EXAM: PORTABLE CHEST - 1 VIEW  COMPARISON:  12/03/2014  FINDINGS: Mild cardiomegaly. Low lung volumes with bibasilar opacities likely atelectasis. No effusions or pneumothorax. Previously seen right rib fractures not visible on today's study.  IMPRESSION: Low lung volumes with bibasilar atelectasis.   Electronically Signed   By: Rolm Baptise M.D.   On: 12/21/2014 16:53      Assessment/Plan Active Problems:   Essential hypertension   Atrial fibrillation   Ascites   Altered mental status   Cirrhosis with alcoholism   Hyponatremia   Altered mental state   1. Altered Mental  Status -likely related to encephalopathy from his cirrhosis -will continue with lactulose he appears to be clearing up here in the ED -will check ammonia levels  2. Hypotension -will hold lasix and spironolactone for now -fluid bolus being given and has shown good response in the ED Keep MAP>65 -will admit to Telemetry as no step down beds currently available and he is clinically responding  3. Elevated WBC -has had fever noted prior history of ascites will need further evaluation -will start on empiric antibiotics vanc and zosyn with rocephin to cover for possible SBP also  4. Cirrhosis with alcohol abuse -currently he is not drinking -reviewed LFTs appear to be close to prior levels at baseline  5. Ascites -have ordered a paracentesis for the morning -will assess for possible SBP -start on empiric antibiotics  6. Atrial Fibrillation -currently rate controlled -holding plavix for now in prep for paracentesis -will monitor on telemetry  7. Essential HTN -pressures are soft -will need to monitor -hold betapace dose for today and reassess in am  8. Hyponatremia -started on IVF -repeat labs in am    Code Status: Full Code (must indicate code status--if unknown or must be presumed, indicate so) DVT Prophylaxis:SCD Family Communication: wife (indicate person spoken with, if applicable, with phone number if by telephone) Disposition Plan: Home (indicate anticipated LOS)  Time spent: 38min  Samik Balkcom A [facility] Pager [phone]

## 2014-12-21 NOTE — ED Provider Notes (Signed)
CSN: [id number]     Arrival date & time 12/21/14  1609 History   First MD Initiated Contact with Patient 12/21/14 1612     Chief Complaint  Patient presents with  . Altered Mental Status  . Fever     (Consider location/radiation/quality/duration/timing/severity/associated sxs/prior Treatment) HPI Comments: Level V caveat apply secondary to altered mental status  The patient is a 64 year old male, he has a history of prostate cancer as well as alcohol-induced hepatitis, cirrhosis and resultant hepatic encephalopathy. He had a recent fall resulting in fractures of posterior ribs #10, #11, #12. He was admitted to the hospital secondary to hypotension, hypoxia. This improved and the patient was discharged home. He presents today by ambulance stating that the patient has had recurrent fall on Friday and today was noted to be febrile at 102.5, had altered mental status, confusion, unable to follow commands very well. The patient is unable to give me any other valuable information, he is able to wake up and answer simple questions but not give any historical answers. Paramedics reported blood pressure of 95 systolic, no tachycardia, no tachypnea or hypoxia.  Prostate cancer was diagnosed in 2009 has a primary prostate with metastatic disease to bone. He underwent treatment with chemotherapy with good response at that time. As recently as April of this year the patient was still undergoing some kind of chemotherapy which was stopped while he was inpatient in April.  Patient is a 64 y.o. male presenting with altered mental status and fever. The history is provided by the patient and the EMS personnel.  Altered Mental Status Associated symptoms: fever   Fever   Past Medical History  Diagnosis Date  . Atrial fibrillation   . Other and unspecified hyperlipidemia   . Unspecified essential hypertension   . Malignant neoplasm of prostate 11/2007    Mets to Bone  . Elevated prostate specific antigen  (PSA)   . Personal history of colonic polyps   . Hypocalcemia   . Acute alcoholic hepatitis   . Essential and other specified forms of tremor   . Anxiety state, unspecified   . Family history of malignant neoplasm of gastrointestinal tract   . Alcohol abuse with physiological dependence 02/13/2012  . 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 Alcoholic hepatitis with ascites 11/13/2014   Past Surgical History  Procedure Laterality Date  . Vasectomy    . Cholecystectomy    . Coronary stent placement  2011  . Colonoscopy w/ biopsies     Family History  Problem Relation Age of Onset  . Prostate cancer Father   . Coronary artery disease Father   . Colon cancer Father   . Aneurysm Mother    History  Substance Use Topics  . Smoking status: Former Smoker    Types: Cigarettes  . Smokeless tobacco: Never Used     Comment: stopped in college  . Alcohol Use: Yes     Comment: has consumed as much as a 1/2 gallon of vodka in a day, now drinks at least 3-5 drinks per day.     Review of Systems  Unable to perform ROS: Mental status change  Constitutional: Positive for fever.      Allergies  Review of patient's allergies indicates no known allergies.  Home Medications   Prior to Admission medications   Medication Sig Start Date End Date Taking? Authorizing Provider  CALCIUM CARB-CHOLECALCIFEROL PO Take 1  capsule by mouth 2 (two) times daily.    Yes Historical Provider, MD  clopidogrel (PLAVIX) 75 MG tablet TAKE ONE TABLET BY MOUTH ONCE DAILY Patient taking differently: Take 75 mg by mouth daily.  06/22/14  Yes Sherren Mocha, MD  folic acid (FOLVITE) 1 MG tablet Take one tablet by mouth once a day.... Patient taking differently: Take 1 mg by mouth daily.  06/22/14  Yes Sherren Mocha, MD  furosemide (LASIX) 20 MG tablet Take 0.5 tablets (10 mg total) by mouth daily. 12/06/14  Yes Nita Sells,  MD  lactulose (CHRONULAC) 10 GM/15ML solution Take 15 mLs (10 g total) by mouth 2 (two) times daily. Patient taking differently: Take 10 g by mouth 3 (three) times daily.  12/06/14  Yes Nita Sells, MD  Multiple Vitamin (MULTIVITAMIN) tablet Take 1 tablet by mouth daily.     Yes Historical Provider, MD  oxyCODONE (OXY IR/ROXICODONE) 5 MG immediate release tablet Take 1 tablet (5 mg total) by mouth every 4 (four) hours as needed for moderate pain. Patient taking differently: Take 2.5-5 mg by mouth 3 (three) times daily as needed for severe pain (pain).  12/06/14  Yes Nita Sells, MD  potassium chloride SA (K-DUR,KLOR-CON) 20 MEQ tablet Take 1 tablet (20 mEq total) by mouth 2 (two) times daily. 11/13/14  Yes Gatha Mayer, MD  sotalol (BETAPACE) 120 MG tablet Take 120 mg by mouth daily.  11/10/14  Yes Historical Provider, MD  spironolactone (ALDACTONE) 25 MG tablet Take 1 tablet (25 mg total) by mouth daily. 12/06/14  Yes Nita Sells, MD  chlordiazePOXIDE (LIBRIUM) 25 MG capsule Take 1 capsule (25 mg total) by mouth 3 (three) times daily. Patient not taking: Reported on 12/21/2014 12/06/14   Nita Sells, MD  sotalol (BETAPACE) 80 MG tablet Take 1 tablet (80 mg total) by mouth daily. T Patient not taking: Reported on 12/21/2014 11/24/14   Theodis Blaze, MD  UNKNOWN TO PATIENT Slow release hormone implant placed 8/13 by Dr. Willette Cluster.  Good for one year    Historical Provider, MD   BP 90/47 mmHg  Pulse 71  Temp(Src) 100 F (37.8 C) (Oral)  Resp 22  Ht 6\' 2"  (1.88 m)  Wt 203 lb (92.08 kg)  BMI 26.05 kg/m2  SpO2 94% Physical Exam  Constitutional: He appears well-developed and well-nourished. No distress.  HENT:  Head: Normocephalic and atraumatic.  Mouth/Throat: Oropharynx is clear and moist. No oropharyngeal exudate.  No erythema exudate asymmetry or hypertrophy of the posterior pharynx, mucous membranes are moist  Eyes: EOM are normal. Pupils are equal, round, and  reactive to light. Right eye exhibits no discharge. Left eye exhibits no discharge. Scleral icterus is present.  Jaundiced eyes bilaterally  Neck: Normal range of motion. Neck supple. No JVD present. No thyromegaly present.  Cardiovascular: Normal rate, regular rhythm, normal heart sounds and intact distal pulses.  Exam reveals no gallop and no friction rub.   No murmur heard. Pulmonary/Chest: Effort normal. No respiratory distress. He has no wheezes. He has rales ( Rales at the right base). He exhibits tenderness (mild chest tenderness to the posterior right chest wall).  Abdominal: Soft. Bowel sounds are normal. He exhibits no distension and no mass. There is no tenderness.  Hepatosplenomegaly palpated  Musculoskeletal: Normal range of motion. He exhibits edema (scant bilateral lower extremity symmetrical edema). He exhibits no tenderness.  Compartments are soft, joints are supple, no rashes to the skin  Lymphadenopathy:    He has no cervical adenopathy.  Neurological: He is alert. Coordination normal.  Somnolent, easily arousable, follows simple commands, diffusely weak, significant difficulty sitting up in bed. Extraocular movements are normal, cranial nerves III through XII appear normal, strength in both extremities is generally weak but symmetrical  Skin: Skin is warm and dry. No rash noted. No erythema.  Psychiatric: He has a normal mood and affect. His behavior is normal.  Nursing note and vitals reviewed.   ED Course  Procedures (including critical care time) Labs Review Labs Reviewed  AMMONIA - Abnormal; Notable for the following:    Ammonia 41 (*)    All other components within normal limits  COMPREHENSIVE METABOLIC PANEL - Abnormal; Notable for the following:    Sodium 127 (*)    Chloride 98 (*)    Calcium 8.2 (*)    Albumin 2.7 (*)    AST 104 (*)    Alkaline Phosphatase 150 (*)    Total Bilirubin 5.3 (*)    All other components within normal limits  CBC - Abnormal;  Notable for the following:    WBC 14.9 (*)    RBC 2.66 (*)    Hemoglobin 10.8 (*)    HCT 32.2 (*)    MCV 121.1 (*)    MCH 40.6 (*)    All other components within normal limits  URINALYSIS, ROUTINE W REFLEX MICROSCOPIC - Abnormal; Notable for the following:    Hgb urine dipstick TRACE (*)    Protein, ur 30 (*)    Nitrite POSITIVE (*)    Leukocytes, UA SMALL (*)    All other components within normal limits  I-STAT CG4 LACTIC ACID, ED - Abnormal; Notable for the following:    Lactic Acid, Venous 2.41 (*)    All other components within normal limits  CULTURE, BLOOD (ROUTINE X 2)  CULTURE, BLOOD (ROUTINE X 2)  URINE CULTURE  URINE MICROSCOPIC-ADD ON  I-STAT CG4 LACTIC ACID, ED  I-STAT CG4 LACTIC ACID, ED  I-STAT CG4 LACTIC ACID, ED    Imaging Review Dg Chest Port 1 View  12/21/2014   CLINICAL DATA:  Altered mental status.  Fever.  EXAM: PORTABLE CHEST - 1 VIEW  COMPARISON:  12/03/2014  FINDINGS: Mild cardiomegaly. Low lung volumes with bibasilar opacities likely atelectasis. No effusions or pneumothorax. Previously seen right rib fractures not visible on today's study.  IMPRESSION: Low lung volumes with bibasilar atelectasis.   Electronically Signed   By: Rolm Baptise M.D.   On: 12/21/2014 16:53     EKG Interpretation   Date/Time:  Monday Dec 21 2014 16:57:48 EDT Ventricular Rate:  81 PR Interval:  155 QRS Duration: 93 QT Interval:  414 QTC Calculation: 481 R Axis:   -13 Text Interpretation:  Sinus rhythm Low voltage, precordial leads  Nonspecific T abnormalities, anterior leads Borderline prolonged QT  interval Abnormal ekg since last tracing no significant change Confirmed  by Sabra Heck  MD, Nguyet Mercer ([id number]) on 12/21/2014 5:32:32 PM      MDM   Final diagnoses:  Acute encephalopathy  Fever, unknown origin  Sepsis  The patient is a febrile illness with altered mental status, of course this could be related to hepatic encephalopathy but in the presence of fever and altered  mental status there is also significant concern for sepsis especially given his hypotension. We'll start with fluid resuscitation, he does have rales at the right base, this is the location of his prior rib fractures, atelectasis was seen on prior x-ray however would assume this is infectious until proven  otherwise. Antibiotics ordered. Check labs, x-ray, anticipate admission to the hospital.  Urinalysis clean, chest x-ray without acute infiltrates, blood counts are elevated consistent with infection, fever has defervesced after getting ibuprofen, blood pressures are also soft requiring fluid resuscitation, 30 mL/kg, broad-spectrum antibiotics for sepsis protocol, the patient is critically ill with ongoing hypotension and fever with leukocytosis consistent with SIRS and likely sepsis.  CRITICAL CARE Performed by: Johnna Acosta Total critical care time: 35 Critical care time was exclusive of separately billable procedures and treating other patients. Critical care was necessary to treat or prevent imminent or life-threatening deterioration. Critical care was time spent personally by me on the following activities: development of treatment plan with patient and/or surrogate as well as nursing, discussions with consultants, evaluation of patient's response to treatment, examination of patient, obtaining history from patient or surrogate, ordering and performing treatments and interventions, ordering and review of laboratory studies, ordering and review of radiographic studies, pulse oximetry and re-evaluation of patient's condition.   Meds given in ED:  Medications  sodium chloride 0.9 % bolus 1,000 mL (1,000 mLs Intravenous New Bag/Given 12/21/14 1937)  ceFEPIme (MAXIPIME) 1 g in dextrose 5 % 50 mL IVPB (not administered)  vancomycin (VANCOCIN) 1,250 mg in sodium chloride 0.9 % 250 mL IVPB (not administered)  albuterol (PROVENTIL) (2.5 MG/3ML) 0.083% nebulizer solution 5 mg (not administered)   sodium chloride 0.9 % bolus 1,000 mL (1,000 mLs Intravenous New Bag/Given 12/21/14 1707)  ibuprofen (ADVIL,MOTRIN) tablet 800 mg (800 mg Oral Given 12/21/14 1823)  ceFEPIme (MAXIPIME) 2 g in dextrose 5 % 50 mL IVPB (0 g Intravenous Stopped 12/21/14 1737)  vancomycin (VANCOCIN) IVPB 1000 mg/200 mL premix (0 mg Intravenous Stopped 12/21/14 1852)  lactulose (CHRONULAC) 10 GM/15ML solution 30 g (30 g Oral Given 12/21/14 1901)      Noemi Chapel, MD 12/21/14 1944

## 2014-12-22 ENCOUNTER — Encounter: Payer: Self-pay | Admitting: *Deleted

## 2014-12-22 ENCOUNTER — Telehealth: Payer: Self-pay | Admitting: *Deleted

## 2014-12-22 ENCOUNTER — Inpatient Hospital Stay (HOSPITAL_COMMUNITY): Payer: Medicare Other

## 2014-12-22 DIAGNOSIS — A4150 Gram-negative sepsis, unspecified: Secondary | ICD-10-CM

## 2014-12-22 DIAGNOSIS — K7031 Alcoholic cirrhosis of liver with ascites: Secondary | ICD-10-CM

## 2014-12-22 DIAGNOSIS — I4891 Unspecified atrial fibrillation: Secondary | ICD-10-CM

## 2014-12-22 DIAGNOSIS — G9340 Encephalopathy, unspecified: Secondary | ICD-10-CM

## 2014-12-22 DIAGNOSIS — A419 Sepsis, unspecified organism: Secondary | ICD-10-CM

## 2014-12-22 LAB — BASIC METABOLIC PANEL
Anion gap: 6 (ref 5–15)
BUN: 12 mg/dL (ref 6–20)
CALCIUM: 7.1 mg/dL — AB (ref 8.9–10.3)
CO2: 21 mmol/L — ABNORMAL LOW (ref 22–32)
CREATININE: 1.05 mg/dL (ref 0.61–1.24)
Chloride: 107 mmol/L (ref 101–111)
GFR calc Af Amer: >60 mL/min (ref >60)
GFR calc non Af Amer: >60 mL/min (ref >60)
GLUCOSE: 120 mg/dL — AB (ref 65–99)
Potassium: 3.8 mmol/L (ref 3.5–5.1)
Sodium: 134 mmol/L — ABNORMAL LOW (ref 135–145)

## 2014-12-22 LAB — CBC
HEMATOCRIT: 24.7 % — AB (ref 39.0–52.0)
Hemoglobin: 8.1 g/dL — ABNORMAL LOW (ref 13.0–17.0)
MCH: 39.7 pg — ABNORMAL HIGH (ref 26.0–34.0)
MCHC: 32.8 g/dL (ref 30.0–36.0)
MCV: 121.1 fL — ABNORMAL HIGH (ref 78.0–100.0)
PLATELETS: 164 10*3/uL (ref 150–400)
RBC: 2.04 MIL/uL — ABNORMAL LOW (ref 4.22–5.81)
RDW: 15.1 % (ref 11.5–15.5)
WBC: 15 10*3/uL — ABNORMAL HIGH (ref 4.0–10.5)

## 2014-12-22 LAB — COMPREHENSIVE METABOLIC PANEL
ALK PHOS: 107 U/L (ref 38–126)
ALT: 33 U/L (ref 17–63)
AST: 77 U/L — ABNORMAL HIGH (ref 15–41)
Albumin: 2.1 g/dL — ABNORMAL LOW (ref 3.5–5.0)
Anion gap: 9 (ref 5–15)
BILIRUBIN TOTAL: 4.9 mg/dL — AB (ref 0.3–1.2)
BUN: 14 mg/dL (ref 6–20)
CALCIUM: 6.8 mg/dL — AB (ref 8.9–10.3)
CO2: 17 mmol/L — ABNORMAL LOW (ref 22–32)
Chloride: 107 mmol/L (ref 101–111)
Creatinine, Ser: 0.89 mg/dL (ref 0.61–1.24)
GFR calc Af Amer: >60 mL/min (ref >60)
Glucose, Bld: 93 mg/dL (ref 65–99)
POTASSIUM: 3.4 mmol/L — AB (ref 3.5–5.1)
Sodium: 133 mmol/L — ABNORMAL LOW (ref 135–145)
Total Protein: 6.3 g/dL — ABNORMAL LOW (ref 6.5–8.1)

## 2014-12-22 LAB — URINE CULTURE
Colony Count: NO GROWTH
Culture: NO GROWTH

## 2014-12-22 LAB — PROCALCITONIN: Procalcitonin: 0.95 ng/mL

## 2014-12-22 LAB — MRSA PCR SCREENING: MRSA by PCR: NEGATIVE

## 2014-12-22 LAB — LACTIC ACID, PLASMA
LACTIC ACID, VENOUS: 0.9 mmol/L (ref 0.5–2.0)
LACTIC ACID, VENOUS: 2.5 mmol/L — AB (ref 0.5–2.0)

## 2014-12-22 LAB — PROTIME-INR
INR: 1.95 — ABNORMAL HIGH (ref 0.00–1.49)
Prothrombin Time: 22.4 seconds — ABNORMAL HIGH (ref 11.6–15.2)

## 2014-12-22 LAB — OCCULT BLOOD X 1 CARD TO LAB, STOOL: Fecal Occult Bld: POSITIVE — AB

## 2014-12-22 LAB — CLOSTRIDIUM DIFFICILE BY PCR: CDIFFPCR: NEGATIVE

## 2014-12-22 MED ORDER — SODIUM CHLORIDE 0.9 % IV SOLN: INTRAVENOUS | Status: DC

## 2014-12-22 MED ORDER — SODIUM CHLORIDE 0.9 % IV BOLUS (SEPSIS)
500.0000 mL | Freq: Once | INTRAVENOUS | Status: AC
  Administered 2014-12-22: 500 mL via INTRAVENOUS

## 2014-12-22 MED ORDER — SODIUM CHLORIDE 0.9 % IV BOLUS (SEPSIS)
1000.0000 mL | Freq: Once | INTRAVENOUS | Status: AC
  Administered 2014-12-22: 1000 mL via INTRAVENOUS

## 2014-12-22 MED ORDER — SODIUM CHLORIDE 0.9 % IV SOLN: INTRAVENOUS | Status: AC

## 2014-12-22 MED ORDER — SODIUM CHLORIDE 0.9 % IV BOLUS (SEPSIS)
250.0000 mL | Freq: Once | INTRAVENOUS | Status: AC
  Administered 2014-12-22: 250 mL via INTRAVENOUS

## 2014-12-22 MED ORDER — LORAZEPAM 2 MG/ML IJ SOLN
INTRAMUSCULAR | Status: AC
  Filled 2014-12-22: qty 1

## 2014-12-22 MED ORDER — LORAZEPAM 2 MG/ML IJ SOLN
0.5000 mg | Freq: Once | INTRAMUSCULAR | Status: AC
Start: 2014-12-22 — End: 2014-12-22
  Administered 2014-12-22: 0.5 mg via INTRAVENOUS

## 2014-12-22 MED ORDER — CLOPIDOGREL BISULFATE 75 MG PO TABS
75.0000 mg | ORAL_TABLET | Freq: Every day | ORAL | Status: DC
  Administered 2014-12-22 – 2014-12-29 (×8): 75 mg via ORAL
  Filled 2014-12-22 (×8): qty 1

## 2014-12-22 MED ORDER — RIFAXIMIN 550 MG PO TABS
550.0000 mg | ORAL_TABLET | Freq: Two times a day (BID) | ORAL | Status: DC
  Administered 2014-12-22 – 2014-12-29 (×15): 550 mg via ORAL
  Filled 2014-12-22 (×19): qty 1

## 2014-12-22 MED ORDER — SPIRONOLACTONE 25 MG PO TABS: 25.0000 mg | ORAL_TABLET | Freq: Every day | ORAL | Status: DC

## 2014-12-22 MED ORDER — FUROSEMIDE 20 MG PO TABS: 10.0000 mg | ORAL_TABLET | Freq: Every day | ORAL | Status: DC

## 2014-12-22 MED ORDER — SODIUM BICARBONATE 8.4 % IV SOLN
INTRAVENOUS | Status: DC
  Administered 2014-12-22: 16:00:00 via INTRAVENOUS
  Filled 2014-12-22 (×2): qty 150

## 2014-12-22 MED ORDER — POTASSIUM CHLORIDE CRYS ER 20 MEQ PO TBCR
40.0000 meq | EXTENDED_RELEASE_TABLET | Freq: Once | ORAL | Status: AC
  Administered 2014-12-22: 40 meq via ORAL
  Filled 2014-12-22: qty 2

## 2014-12-22 MED ORDER — POTASSIUM CHLORIDE CRYS ER 20 MEQ PO TBCR
20.0000 meq | EXTENDED_RELEASE_TABLET | Freq: Once | ORAL | Status: AC
  Administered 2014-12-22: 20 meq via ORAL
  Filled 2014-12-22: qty 1

## 2014-12-22 MED ORDER — SODIUM CHLORIDE 0.9 % IV SOLN
INTRAVENOUS | Status: DC
  Administered 2014-12-28: 08:00:00 via INTRAVENOUS

## 2014-12-22 MED ORDER — SOTALOL HCL 120 MG PO TABS: 120.0000 mg | ORAL_TABLET | Freq: Every day | ORAL | Status: DC

## 2014-12-22 NOTE — Telephone Encounter (Signed)
TC from Dr. Leisa Lenz. Pt has been admitted to rm # [id number] with fever, altered mental status, sepsis

## 2014-12-22 NOTE — Progress Notes (Signed)
Regarding a message from 12/18/14, TENS unit per physical therapy. Per Dr. [name], it's not his call since, he is not treating the patient. The patient is currently admitted to [facility]

## 2014-12-22 NOTE — Progress Notes (Addendum)
Patient ID: Allen Schroeder, male   DOB: [birth year], 64 y.o.   MRN: [id number] [facility] PROGRESS NOTE  Allen Schroeder YQM:[id number] DOB: [birth year] DOA: 12/21/2014 PCP: Allen Ly, MD  Brief narrative:    64 y.o. 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 [name]), was on [facility] which was stopped because patient developed jaundiced although he has had an excellent response to Xtandi at least in regards to monitored PSA values. Of note, last CT scan in 11/2014 showed sclerotic lesions from metastatic disease and new sclerotic lesion in L2 area. Further history includes CAD, atrial fibrillation, alcohol abuse, hypertension, recent hospitalization in 11/2014 for fall at which time he sustained rib fractures.  Patient presented from home to [facility] with worsening mental status changes, confusion and fevers. On admission, blood pressure was 77/44, HR 98, RR 16 - 30, T max 102.9 F and oxygen saturation as low as 89% which has improved with Central oxygen support.  blood work was notable for leukocytosis of 14.9, hemoglobin 10.8, sodium 127, normal creatinine, INR 1.57. Lactic acid was 2.41 and procalcitonin 0.95. Ammonia level was about 40. Chest x-ray showed low lung volumes with bibasilar atelectasis.   Patient was started on broad-spectrum antibiotics, vanco and zosyn for sepsis although no clear source of infection identified at the time of the admission (now we know his blood cultures are growing gram negative rods). He was admitted to stepdown unit because of hypotension. Please note that patient remains in step down unit because of ongoing hypotension.   Assessment/Plan:    Principal problem: Sepsis / leukocytosis / Gram negative rod bacteremia  - Sepsis criteria met on the admission with hypotension, tachycardia, tachypnea, fever, hypoxia. Lactic acid was 2.41 and procalcitonin 0.95. Source of infection  - gram negative rod bacteremia based on blood cultures obtained on the admission. - Patient was admitted to SDU and started on broad-spectrum antibiotics, vancomycin and Zosyn. - Patient remains hypotensive but is not on pressor support.  - PCCM consulted for sepsis management.  - Will continue IV fluids for now.  - Continue to monitor in step down unit.  Active Problem: Acute respiratory failure with hypoxia - Considering patient's history of alcohol abuse, aspiration pneumonia is a possibility as well as healthcare associated pneumonia considering his recent admission. - Chest x-ray on the admission did not reveal acute cardiopulmonary findings other than bibasilar atelectasis. - Vancomycin and Zosyn started at the time of the admission. Continue this antibiotic regimen. - Respiratory status is stable at this time.  History of prostate cancer with bone metastasis - Follows with Dr. [name] on oncology - Last CT abdomen/pelvis in April 2016 with sclerotic lesions consistent with metastasis and there was a new sclerotic lesion in L2 area. - Dr. [name] informed of patient's admission.  Anemia of chronic disease -Related to bone marrow failure from alcohol abuse and history of malignancy - Hemoglobin is 8.1 this morning. Continue to monitor CBC daily.   Hypokalemia - Perhaps related to sepsis, Lasix. Lasix was placed on hold at the time of the admission because of hypotension. - Supplemented. - Follow-up BMP tomorrow morning.  Hyponatremia - Likely related to liver cirrhosis. - Sodium improved since admission from 127 to 133 likely from initial fluids given in ED.  Transaminitis with hyperbilirubinemia / Alcoholic hepatitis with cirrhosis / Coagulopathy  - Likely alcohol induced hepatitis. - AST elevated at 77 and bilirubin 4.9 - Abdominal US on this  admission showed questionable minimal ascites. - Limited in terms of giving lasix, spironolactone and sotalol due to hypotension    Paroxysmal Atrial fibrillation - CHADS vasc score at least 3 - Sotalol on hold due to hypotension - Continue Plavix daily  - Not on other anticoagulation due to risk of falls and alcohol abuse   Acute hepatic encephalopathy - Related to history of alcohol abuse. Ammonia level is around 40 on this admission. - Mental status better this morning. - Continue lactulose 10 g 3 times daily - Start rifaximin.      DVT Prophylaxis  - SCD's bilaterally    Code Status: Full.  Family Communication:  plan of care discussed with the patient, his son and wife at the bedside  Disposition Plan: still hypotensive, septic so remains in SDU.  IV access:  Peripheral IV  Procedures and diagnostic studies:    US Abdomen Limited 12/22/2014  Questionable minimal ascites, significantly decreased.   Electronically Signed   By: Claudie Revering M.D.   On: 12/22/2014 09:42   Dg Chest Port 1 View 12/21/2014  Low lung volumes with bibasilar atelectasis.   Electronically Signed   By: Rolm Baptise M.D.   On: 12/21/2014 16:53   Medical Consultants:  PCCM   Other Consultants:  None   IAnti-Infectives:   Vancomycin 12/21/2014 --> Zosyn 12/21/2014 -->   Leisa Lenz, MD  [facility] Pager [phone]  Time spent in minutes: 25 minutes  If 7PM-7AM, please contact night-coverage [email or web address] Password [facility] 12/22/2014, 11:31 AM   LOS: 1 day    HPI/Subjective: No acute overnight events. Patient reports no nausea or vomiting. Speech is slow. Per family, pt less confused.   Objective: Filed Vitals:   12/22/14 0530 12/22/14 0600 12/22/14 0700 12/22/14 0800  BP: 80/46 85/47 90/52  88/53  Pulse: 73 73 72 75  Temp:    98 F (36.7 C)  TempSrc:    Oral  Resp: 24 25 22 18   Height:      Weight:      SpO2: 94% 95% 96% 99%    Intake/Output Summary (Last 24 hours) at 12/22/14 1131 Last data filed at 12/22/14 0600  Gross per 24 hour  Intake    600 ml  Output    300 ml  Net    300 ml     Exam:   General:  Pt is alert, follows commands appropriately, not in acute distress, (+) jaundice   Cardiovascular: Regular rate and rhythm, S1/S2 (+)  Respiratory: Clear to auscultation bilaterally, no wheezing, no crackles, no rhonchi  Abdomen: Softly distended, bowel sounds present  Extremities: No edema, pulses DP and PT palpable bilaterally  Neuro: Grossly nonfocal  Data Reviewed: Basic Metabolic Panel:  Recent Labs Lab 12/21/14 1706 12/21/14 2227 12/22/14 0415  NA 127* 133* 133*  K 4.2 3.6 3.4*  CL 98* 106 107  CO2 23 19* 17*  GLUCOSE 92 99 93  BUN 16 15 14   CREATININE 1.13 1.03 0.89  CALCIUM 8.2* 7.4* 6.8*   Liver Function Tests:  Recent Labs Lab 12/21/14 1706 12/21/14 2227 12/22/14 0415  AST 104* 83* 77*  ALT 41 32 33  ALKPHOS 150* 117 107  BILITOT 5.3* 4.8* 4.9*  PROT 8.0 6.4* 6.3*  ALBUMIN 2.7* 2.3* 2.1*   No results for input(s): LIPASE, AMYLASE in the last 168 hours.  Recent Labs Lab 12/21/14 1705  AMMONIA 41*   CBC:  Recent Labs Lab 12/21/14 1706 12/21/14 2227 12/22/14 0415  WBC 14.9* 14.1*  15.0*  NEUTROABS  --  12.3*  --   HGB 10.8* 9.2* 8.1*  HCT 32.2* 27.6* 24.7*  MCV 121.1* 122.1* 121.1*  PLT 241 202 164   Cardiac Enzymes: No results for input(s): CKTOTAL, CKMB, CKMBINDEX, TROPONINI in the last 168 hours. BNP: Invalid input(s): POCBNP CBG: No results for input(s): GLUCAP in the last 168 hours.  Recent Results (from the past 240 hour(s))  Blood culture (routine x 2)     Status: None (Preliminary result)   Collection Time: 12/21/14  4:48 PM  Result Value Ref Range Status   Specimen Description BLOOD RAC  Final   Special Requests BOTTLES DRAWN AEROBIC AND ANAEROBIC 5CC  Final   Culture   Final    GRAM NEGATIVE RODS Note: Gram Stain Report Called to,Read Back By and Verified With: TAMMY PENNINGTON 12/22/14 @ 0950 BY PARDA Performed at [facility]    Report Status PENDING  Incomplete  Blood culture  (routine x 2)     Status: None (Preliminary result)   Collection Time: 12/21/14  4:48 PM  Result Value Ref Range Status   Specimen Description BLOOD LAC  Final   Special Requests BOTTLES DRAWN AEROBIC AND ANAEROBIC 5CC  Final   Culture   Final    GRAM NEGATIVE RODS Note: Gram Stain Report Called to,Read Back By and Verified With: TAMMY PENNINGTON 1118 ON [id number] BY Stanley Performed at [facility]    Report Status PENDING  Incomplete  MRSA PCR Screening     Status: None   Collection Time: 12/21/14 11:08 PM  Result Value Ref Range Status   MRSA by PCR NEGATIVE NEGATIVE Final    Comment:        The GeneXpert MRSA Assay (FDA approved for NASAL specimens only), is one component of a comprehensive MRSA colonization surveillance program. It is not intended to diagnose MRSA infection nor to guide or monitor treatment for MRSA infections.   Clostridium Difficile by PCR     Status: None   Collection Time: 12/21/14 11:58 PM  Result Value Ref Range Status   C difficile by pcr NEGATIVE NEGATIVE Final     Scheduled Meds: . docusate sodium  100 mg Oral BID  . folic acid  1 mg Oral Daily  . lactulose  10 g Oral TID  . multivitamin with minerals  1 tablet Oral Daily  . piperacillin-tazobactam (ZOSYN)  IV  3.375 g Intravenous 3 times per day  . potassium chloride  20 mEq Oral Once  . rifaximin  550 mg Oral BID  . sodium chloride  3 mL Intravenous Q12H  . thiamine  100 mg Oral Daily  . vancomycin  1,250 mg Intravenous Q12H   Continuous Infusions: . sodium chloride

## 2014-12-22 NOTE — Progress Notes (Signed)
[facility]  Patient Status: Active (receiving services up to time of hospitalization)  [facility] is providing the following services: RN, PT and OT  If patient discharges after hours, please call [phone].   Allen Schroeder 12/22/2014, 12:12 PM

## 2014-12-22 NOTE — Consult Note (Signed)
Name: Allen Schroeder MRN: [id number] DOB: 1951-03-04    ADMISSION DATE:  12/21/2014 CONSULTATION DATE:  5/17  REFERRING MD :  Charlies Silvers   CHIEF COMPLAINT:  Sepsis   BRIEF PATIENT DESCRIPTION:  64 year old male admitted on 5/16 w/ GNR bacteremia. Initially had lactic acid 2.5, and hypotension. Was volume resuscitated, antibiotics were started. PCCM asked to see on 5/17 to see for further recs.   SIGNIFICANT EVENTS    STUDIES:     HISTORY OF PRESENT ILLNESS:   64 y.o. male with cirrhosis of the liver with alcohol abuse, atrial fibrillation, metastatic prostate cancer and HTN presents to the ED with altered mental status on 5/16.  He was last noted to be his normal state over the weekend. His wife states that he was working with OT on Sunday. She reports that he was more the following afternoon. Patient was working with the therapist and she noted a fever of 102.39F. He reported occasional cough. Denied having any abdominal pain but the wife notes that his abdomen is more swollen than he was on the last admission. He was discharged to a facility in April and during that stay he fell and had possible rib fractures. He was discharged on May 1st to home. His wife states that he seems to be less confused now than He was earlier this afternoon. Patient has been on pain meds and was recently taken off librium last week. Patient has been told not to take tylenol and aleve for pain. He was admitted w/ working dx of SIRS/sepsis w/ initial lactic acid of 2.5. Cultures were obtained, empiric antibiotics were started, and he was started on fluid resuscitation. His lactic acid cleared. Hemodynamically his BP seems to have stabilized. Cultures to data are growing GNR in blood. PCCM was asked to see for sepsis.     PAST MEDICAL HISTORY :   has a past medical history of Atrial fibrillation; Other and unspecified hyperlipidemia; Unspecified essential hypertension; Malignant neoplasm of prostate (11/2007);  Elevated prostate specific antigen (PSA); Personal history of colonic polyps; Hypocalcemia; Acute alcoholic hepatitis; Essential and other specified forms of tremor; Anxiety state, unspecified; Family history of malignant neoplasm of gastrointestinal tract; Alcohol abuse with physiological dependence (02/13/2012); Bisphosphonate-associated osteonecrosis of the jaw (7/82/9562); and Alcoholic hepatitis with ascites (11/13/2014).  has past surgical history that includes Vasectomy; Cholecystectomy; Coronary stent placement (2011); and Colonoscopy w/ biopsies. Prior to Admission medications   Medication Sig Start Date End Date Taking? Authorizing Provider  CALCIUM CARB-CHOLECALCIFEROL PO Take 1 capsule by mouth 2 (two) times daily.    Yes Historical Provider, MD  clopidogrel (PLAVIX) 75 MG tablet TAKE ONE TABLET BY MOUTH ONCE DAILY Patient taking differently: Take 75 mg by mouth daily.  06/22/14  Yes Sherren Mocha, MD  folic acid (FOLVITE) 1 MG tablet Take one tablet by mouth once a day.... Patient taking differently: Take 1 mg by mouth daily.  06/22/14  Yes Sherren Mocha, MD  furosemide (LASIX) 20 MG tablet Take 0.5 tablets (10 mg total) by mouth daily. 12/06/14  Yes Nita Sells, MD  lactulose (CHRONULAC) 10 GM/15ML solution Take 15 mLs (10 g total) by mouth 2 (two) times daily. Patient taking differently: Take 10 g by mouth 3 (three) times daily.  12/06/14  Yes Nita Sells, MD  Multiple Vitamin (MULTIVITAMIN) tablet Take 1 tablet by mouth daily.     Yes Historical Provider, MD  oxyCODONE (OXY IR/ROXICODONE) 5 MG immediate release tablet Take 1 tablet (5 mg total) by  mouth every 4 (four) hours as needed for moderate pain. Patient taking differently: Take 2.5-5 mg by mouth 3 (three) times daily as needed for severe pain (pain).  12/06/14  Yes Nita Sells, MD  potassium chloride SA (K-DUR,KLOR-CON) 20 MEQ tablet Take 1 tablet (20 mEq total) by mouth 2 (two) times daily. 11/13/14  Yes Gatha Mayer, MD  sotalol (BETAPACE) 120 MG tablet Take 120 mg by mouth daily.  11/10/14  Yes Historical Provider, MD  spironolactone (ALDACTONE) 25 MG tablet Take 1 tablet (25 mg total) by mouth daily. 12/06/14  Yes Nita Sells, MD  chlordiazePOXIDE (LIBRIUM) 25 MG capsule Take 1 capsule (25 mg total) by mouth 3 (three) times daily. Patient not taking: Reported on 12/21/2014 12/06/14   Nita Sells, MD  sotalol (BETAPACE) 80 MG tablet Take 1 tablet (80 mg total) by mouth daily. T Patient not taking: Reported on 12/21/2014 11/24/14   Theodis Blaze, MD  UNKNOWN TO PATIENT Slow release hormone implant placed 8/13 by Dr. Willette Cluster.  Good for one year    Historical Provider, MD   No Known Allergies  FAMILY HISTORY:  family history includes Aneurysm in his mother; Colon cancer in his father; Coronary artery disease in his father; Prostate cancer in his father. SOCIAL HISTORY:  reports that he has quit smoking. His smoking use included Cigarettes. He has never used smokeless tobacco. He reports that he drinks alcohol. He reports that he does not use illicit drugs.  REVIEW OF SYSTEMS:   Unable due to encephalopathy  VITAL SIGNS: Temp:  [97.7 F (36.5 C)-102.9 F (39.4 C)] 98.1 F (36.7 C) (05/17 1200) Pulse Rate:  [67-98] 76 (05/17 1100) Resp:  [13-30] 25 (05/17 1100) BP: (77-129)/(42-106) 103/55 mmHg (05/17 1100) SpO2:  [89 %-100 %] 93 % (05/17 1100) Weight:  [92.08 kg (203 lb)-92.2 kg (203 lb 4.2 oz)] 92.2 kg (203 lb 4.2 oz) (05/16 2300)  PHYSICAL EXAMINATION: General:  64 year old male, in no acute distress.  Neuro:  Awake, alert, confused. Oriented x2 HEENT:  MMM, NCAT Cardiovascular:  rrr Lungs:  Decreased in bases  Abdomen:  Distended, non-tender + bowel sounds  Musculoskeletal:  Intact  Skin:  Intact    Recent Labs Lab 12/21/14 1706 12/21/14 2227 12/22/14 0415  NA 127* 133* 133*  K 4.2 3.6 3.4*  CL 98* 106 107  CO2 23 19* 17*  BUN 16 15 14   CREATININE 1.13 1.03  0.89  GLUCOSE 92 99 93   Hepatic Function Latest Ref Rng 12/22/2014 12/21/2014 12/21/2014  Total Protein 6.5 - 8.1 g/dL 6.3(L) 6.4(L) 8.0  Albumin 3.5 - 5.0 g/dL 2.1(L) 2.3(L) 2.7(L)  AST 15 - 41 U/L 77(H) 83(H) 104(H)  ALT 17 - 63 U/L 33 32 41  Alk Phosphatase 38 - 126 U/L 107 117 150(H)  Total Bilirubin 0.3 - 1.2 mg/dL 4.9(H) 4.8(H) 5.3(H)  Bilirubin, Direct 0.0 - 0.3 mg/dL - - -   ABG    Component Value Date/Time   TCO2 25 12/29/2008 1727     Recent Labs Lab 12/21/14 1706 12/21/14 2012 12/21/14 2217 12/21/14 2227 12/22/14 0045 12/22/14 0415  PROCALCITON  --   --   --  0.95  --   --   WBC 14.9*  --   --  14.1*  --  15.0*  LATICACIDVEN  --  0.95 1.2  --  2.5* 0.9    Recent Labs Lab 12/21/14 1706 12/21/14 2227 12/22/14 0415  HGB 10.8* 9.2* 8.1*  HCT  32.2* 27.6* 24.7*  WBC 14.9* 14.1* 15.0*  PLT 241 202 164   US Abdomen Limited  12/22/2014   CLINICAL DATA:  Past history of alcoholic hepatitis. Followup minimal ascites to determine if more has accumulated.  EXAM: LIMITED ABDOMEN ULTRASOUND FOR ASCITES  TECHNIQUE: Limited ultrasound survey for ascites was performed in all four abdominal quadrants.  COMPARISON:  11/23/2014.  FINDINGS: On one of the images, there is questionable minimal ascites adjacent to the liver. Otherwise, no free peritoneal fluid is seen.  IMPRESSION: Questionable minimal ascites, significantly decreased.   Electronically Signed   By: Claudie Revering M.D.   On: 12/22/2014 09:42   Dg Chest Port 1 View  12/21/2014   CLINICAL DATA:  Altered mental status.  Fever.  EXAM: PORTABLE CHEST - 1 VIEW  COMPARISON:  12/03/2014  FINDINGS: Mild cardiomegaly. Low lung volumes with bibasilar opacities likely atelectasis. No effusions or pneumothorax. Previously seen right rib fractures not visible on today's study.  IMPRESSION: Low lung volumes with bibasilar atelectasis.   Electronically Signed   By: Rolm Baptise M.D.   On: 12/21/2014 16:53  PCXR w/ basilar atx    ASSESSMENT / PLAN:  Sepsis in setting GNR bacteremia. SIRS now resolved. Lactic acid cleared. Source not entirely clear. ? Urinary tract? GI? No sig abd pain so doubt SBP. Cdiff was neg. No sig cough or pulm symptoms.  Currently he is encephalopathic but remains hemodynamically stable. Most likely the source is GI or urinary tract.  Plan/rec Cont volume resuscitation efforts/ need to keep him even to positive  Continue zosyn and await sensitivities. Given we know this is a GNR organism think we can d/c vanc  Bibasilar atelectasis Plan pulm hygiene   NAG metabolic acidosis Plan Change IVF to D5w w/ bicarb given bicarb loss w/ diarrhea.  Serial chemistries  Cirrhosis  Plan Cont lactulose and xifaxan   Diarrhea due to above Plan Titrate lactulose accordingly.   Acute Encephalopathy in setting of sepsis Plan Supportive care  All other issues: prostate cancer, anemia of chronic disease, electrolyte imbalance (hypokalemia), coaguloapthy  Plan  Per IM     Have nothing to add to the excellent care provided by Dr Charlies Silvers and her colleagues. We will be available as needed if he were to have symptomatic decline.   Erick Colace ACNP-BC [place] Pager # [phone] OR # [phone] if no answer 12/22/2014, 12:41 PM  Attending Note:  I have examined patient, reviewed labs, studies and notes. I have discussed the case with Jerrye Bushy, and I agree with the data and plans as amended above. Allen Schroeder was admitted with severe sepsis, GNR bacteremia. Original source yet to be determined but suspect urological tract. He has responded well to abx and IVF. On my eval his BP is improved and lactate has cleared with resuscitation. His main residual organ dysfunction is neurological - he wakes but is poorly oriented, is unable to give a history or describe his presenting symptoms. Suspect some of this also reflects his liver disease. We will follow peripherally should he  backtrack, otherwise continue current plans.   Baltazar Apo, MD, PhD 12/22/2014, 5:21 PM [facility] [phone] or if no answer [phone]

## 2014-12-22 NOTE — Care Management Note (Signed)
Case Management Note  Patient Details  Name: Allen Schroeder MRN: [id number] Date of Birth: September 10, 1950  Subjective/Objective:                 Allen Schroeder cirrhosis and sepsis   Action/Plan:  Home when stable   Expected Discharge Date:   (unknown)           [id number]   Expected Discharge Plan:  Home/Self Care  In-House Referral:  Clinical Social Work  Discharge planning Services  CM Consult  Post Acute Care Choice:  NA Choice offered to:  NA  DME Arranged:    DME Agency:     HH Arranged:    [place] Agency:     Status of Service:  In process, will continue to follow  Medicare Important Message Given:    Date Medicare IM Given:    Medicare IM give by:    Date Additional Medicare IM Given:    Additional Medicare Important Message give by:     If discussed at [employer] of Stay Meetings, dates discussed:    Additional Comments:  Leeroy Cha, RN 12/22/2014, 12:05 PM

## 2014-12-22 NOTE — Progress Notes (Signed)
CRITICAL VALUE ALERT  Critical value received:  Lactic acid 2.5  Date of notification:  5/17  Time of notification:  1504  Critical value read back:Yes.    Nurse who received alert:  Darrin Nipper, RN  MD notified (1st page):  [facility] on-call   Time of first page:  0200

## 2014-12-23 ENCOUNTER — Encounter (HOSPITAL_COMMUNITY): Payer: Self-pay

## 2014-12-23 DIAGNOSIS — A4151 Sepsis due to Escherichia coli [E. coli]: Principal | ICD-10-CM

## 2014-12-23 DIAGNOSIS — I480 Paroxysmal atrial fibrillation: Secondary | ICD-10-CM

## 2014-12-23 DIAGNOSIS — K7290 Hepatic failure, unspecified without coma: Secondary | ICD-10-CM

## 2014-12-23 LAB — CBC
HEMATOCRIT: 27.6 % — AB (ref 39.0–52.0)
Hemoglobin: 9.3 g/dL — ABNORMAL LOW (ref 13.0–17.0)
MCH: 40.6 pg — AB (ref 26.0–34.0)
MCHC: 33.7 g/dL (ref 30.0–36.0)
MCV: 120.5 fL — ABNORMAL HIGH (ref 78.0–100.0)
Platelets: 153 10*3/uL (ref 150–400)
RBC: 2.29 MIL/uL — ABNORMAL LOW (ref 4.22–5.81)
RDW: 15.5 % (ref 11.5–15.5)
WBC: 7.8 10*3/uL (ref 4.0–10.5)

## 2014-12-23 LAB — HEMOGLOBIN A1C
HEMOGLOBIN A1C: 4.6 % — AB (ref 4.8–5.6)
MEAN PLASMA GLUCOSE: 85 mg/dL

## 2014-12-23 LAB — BASIC METABOLIC PANEL
ANION GAP: 6 (ref 5–15)
BUN: 11 mg/dL (ref 6–20)
CO2: 24 mmol/L (ref 22–32)
Calcium: 7.2 mg/dL — ABNORMAL LOW (ref 8.9–10.3)
Chloride: 105 mmol/L (ref 101–111)
Creatinine, Ser: 0.97 mg/dL (ref 0.61–1.24)
GFR calc Af Amer: >60 mL/min (ref >60)
Glucose, Bld: 92 mg/dL (ref 65–99)
Potassium: 3.7 mmol/L (ref 3.5–5.1)
SODIUM: 135 mmol/L (ref 135–145)

## 2014-12-23 LAB — GLUCOSE, CAPILLARY
Glucose-Capillary: 96 mg/dL (ref 65–99)
Glucose-Capillary: 89 mg/dL (ref 65–99)

## 2014-12-23 MED ORDER — POTASSIUM CHLORIDE IN NACL 40-0.9 MEQ/L-% IV SOLN
INTRAVENOUS | Status: DC
  Administered 2014-12-23: 60 mL/h via INTRAVENOUS
  Filled 2014-12-23 (×2): qty 1000

## 2014-12-23 MED ORDER — LIDOCAINE 5 % EX PTCH
1.0000 | MEDICATED_PATCH | CUTANEOUS | Status: DC
  Administered 2014-12-23 – 2014-12-28 (×6): 1 via TRANSDERMAL
  Filled 2014-12-23 (×8): qty 1

## 2014-12-23 MED ORDER — OXYCODONE HCL 5 MG PO TABS
2.5000 mg | ORAL_TABLET | Freq: Four times a day (QID) | ORAL | Status: DC | PRN
  Administered 2014-12-23 – 2014-12-25 (×6): 5 mg via ORAL
  Filled 2014-12-23 (×6): qty 1

## 2014-12-23 MED ORDER — OXYCODONE HCL 5 MG PO TABS
5.0000 mg | ORAL_TABLET | Freq: Once | ORAL | Status: AC
Start: 2014-12-23 — End: 2014-12-23
  Administered 2014-12-23: 5 mg via ORAL
  Filled 2014-12-23: qty 1

## 2014-12-23 NOTE — Progress Notes (Signed)
Pt morning labs showed Venous Co2 of 24. Value reported to [place], K and continuous Bicarb drip dc'd.

## 2014-12-23 NOTE — Progress Notes (Addendum)
PROGRESS NOTE    Allen Schroeder UDJ:[id number] DOB: 09/28/1950 DOA: 12/21/2014 PCP: [name], MD  Primary Oncologist: Dr. Zola Button Primary GI: Dr. Silvano Rusk Primary Cardiologist: Dr. Sherren Mocha.  HPI/Brief narrative 64 y.o. 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 [name]), was on [facility] which was stopped because patient developed jaundiced although he has had an excellent response to Xtandi at least in regards to monitored PSA values. Of note, last CT scan in 11/2014 showed sclerotic lesions from metastatic disease and new sclerotic lesion in L2 area. Further history includes CAD, atrial fibrillation, alcohol abuse, hypertension, recent hospitalization in 11/2014 for fall at which time he sustained rib fractures.  Patient presented from home to [facility] with worsening mental status changes, confusion and fevers. On admission, blood pressure was 77/44, HR 98, RR 16 - 30, T max 102.9 F and oxygen saturation as low as 89% which has improved with [place] oxygen support. Blood work was notable for leukocytosis of 14.9, hemoglobin 10.8, sodium 127, normal creatinine, INR 1.57. Lactic acid was 2.41 and procalcitonin 0.95. Ammonia level was about 40. Chest x-ray showed low lung volumes with bibasilar atelectasis.   Patient was started on broad-spectrum antibiotics, vanco and zosyn for sepsis although no clear source of infection identified at the time of the admission (now we know his blood cultures are growing gram negative rods). He was admitted to stepdown unit because of hypotension.     Assessment/Plan:  Principal problem: Sepsis secondary to Escherichia coli bacteremia - Sepsis criteria met on the admission with hypotension, tachycardia, tachypnea, fever, hypoxia. Lactic acid was 2.41 and procalcitonin 0.95.  - Source not clear.? GI translocation. Urine culture negative. C. difficile PCR  negative. Not much ascitic fluid and no symptoms to suggest SBP.  - Patient was admitted to SDU and started on broad-spectrum antibiotics, vancomycin and Zosyn. Vancomycin discontinued. - Clinically improved with temperature defervesced - Follow final blood culture results and adjust antibiotics appropriately. - Soft blood pressures-continue brief IV fluids  Active Problem: Acute respiratory failure with hypoxia - Likely secondary to atelectasis in the context of acute hepatic encephalopathy. - Low index of suspicion for pneumonia. - Chest x-ray on the admission did not reveal acute cardiopulmonary findings other than bibasilar atelectasis. - Resolved  History of prostate cancer with bone metastasis - Follows with Dr. [name] on oncology - Last CT abdomen/pelvis in April 2016 with sclerotic lesions consistent with metastasis and there was a new sclerotic lesion in L2 area. - Discussed with patient's primary oncologist who will meet with/discuss with family and update cancer treatment. Apparently has done well off of Xtandi  Anemia of chronic disease -Related to bone marrow failure from alcohol abuse and history of malignancy - Follow CBC in a.m. and transfuse for hemoglobin < 7 g per DL. - Drop in hemoglobin from 10.8 > 8.1 may be related to critical illness and hemodilution. No reported bleeding.  Hypokalemia - Replaced  Hyponatremia - Likely related to liver cirrhosis. - Improved  Alcoholic cirrhosis with coagulopathy - Abdominal US on this admission showed questionable minimal ascites. - Limited in terms of giving lasix, spironolactone and sotalol due to hypotension  Paroxysmal Atrial fibrillation - CHADS vasc score at least 3 - Sotalol on hold due to hypotension - Continue Plavix daily  - Not on other anticoagulation due to risk of falls and alcohol abuse   Acute hepatic encephalopathy - Secondary to acute illness complicating  alcoholic cirrhosis. Ammonia level is  around 40 on this admission. - Continue lactulose 10 g 3 times daily and rifaximin added - Mental status improving but still has mild asterixis. - Minimize opioids and sedative medications  Right-sided pleuritic/musculoskeletal chest pain - Related to prior rib fractures. - Chest x-ray 5/16 showed bibasilar atelectasis but could not see previous rib fractures - Pain management: Minimize opioids. May have to add lidocaine patch.  Failure to thrive - Secondary to alcoholic cirrhosis and metastatic prostate cancer. - Overall prognosis from liver cirrhosis is poor. Discussed with patient's primary gastroenterologist and oncologist on 5/18. - Patient keen to go on hospice. Family not sure but are agreeable to palliative care consult for goals of care.   History of alcohol abuse - As per family, patient has not had alcohol intake since 11/17/14 - No overt withdrawal features.    Code Status: Full Family Communication: Discussed extensively with patient's spouse and daughter at bedside. Disposition Plan: To be determined. Continue treatment in stepdown unit for additional 24 hours for close monitoring related to intermittent soft blood pressures and high nursing care needs.   Consultants:  CCM-signed off 5/17  Palliative care team  Procedures:  Rectal tube  Condom cath  Antibiotics:  IV cefepime 1 dose on 5/16  IV Zosyn 5/16 >  IV vancomycin 2 doses on 5/16  Rifaximin 5/17>  Subjective: Patient complains of right-sided rib cage pain which she states has been going on since his previous fall and rib fractures. Pain intermittent and rated at 6-7/10. Denies dyspnea or cough.  Objective: Filed Vitals:   12/23/14 1054 12/23/14 1100 12/23/14 1200 12/23/14 1300  BP:  1_0  Pulse: 75 75 73 74  Temp:   98.2 F (36.8 C)   TempSrc:   Oral   Resp: _1 Height:      Weight:      SpO2: 98% 98% 99% 100%    Intake/Output Summary (Last 24 hours) at  12/23/14 1341 Last data filed at 12/23/14 1331  Gross per 24 hour  Intake 1429.17 ml  Output   2310 ml  Net -880.83 ml   Filed Weights   12/21/14 1719 12/21/14 2300 12/23/14 0500  Weight: 92.08 kg (203 lb) 92.2 kg (203 lb 4.2 oz) 95 kg (209 lb 7 oz)     Exam:  General exam: Moderately built and nourished middle-aged chronically ill-looking male sitting up comfortably in bed without distress Respiratory system: Reduced breath sounds in the bases but otherwise clear to auscultation. No increased work of breathing. Cardiovascular system: S1 & S2 heard, RRR. No JVD, murmurs, gallops, clicks or pedal edema. telemetry: Sinus rhythm   Gastrointestinal system: Abdomen is  mildly distended, soft and nontender. Normal bowel sounds heard. Central nervous system: Alert and oriented 3 . No focal neurological deficits. asterixis +  Extremities: Symmetric 5 x 5 power.   Data Reviewed: Basic Metabolic Panel:  Recent Labs Lab 12/21/14 1706 12/21/14 2227 12/22/14 0415 12/22/14 1617 12/23/14 0312  NA 127* 133* 133* 134* 135  K 4.2 3.6 3.4* 3.8 3.7  CL 98* 106 107 107 105  CO2 23 19* 17* 21* 24  GLUCOSE 92 99 93 120* 92  BUN _2 CREATININE 1.13 1.03 0.89 1.05 0.97  CALCIUM 8.2* 7.4* 6.8* 7.1* 7.2*   Liver Function Tests:  Recent Labs Lab 12/21/14 1706 12/21/14 2227 12/22/14 0415  AST 104* 83* 77*  ALT 41 32 33  ALKPHOS  150* 117 107  BILITOT 5.3* 4.8* 4.9*  PROT 8.0 6.4* 6.3*  ALBUMIN 2.7* 2.3* 2.1*   No results for input(s): LIPASE, AMYLASE in the last 168 hours.  Recent Labs Lab 12/21/14 1705  AMMONIA 41*   CBC:  Recent Labs Lab 12/21/14 1706 12/21/14 2227 12/22/14 0415  WBC 14.9* 14.1* 15.0*  NEUTROABS  --  12.3*  --   HGB 10.8* 9.2* 8.1*  HCT 32.2* 27.6* 24.7*  MCV 121.1* 122.1* 121.1*  PLT 241 202 164   Cardiac Enzymes: No results for input(s): CKTOTAL, CKMB, CKMBINDEX, TROPONINI in the last 168 hours. BNP (last 3 results) No results for  input(s): PROBNP in the last 8760 hours. CBG:  Recent Labs Lab 12/22/14 0737 12/23/14 0745  GLUCAP 96 89    Recent Results (from the past 240 hour(s))  Blood culture (routine x 2)     Status: None (Preliminary result)   Collection Time: 12/21/14  4:48 PM  Result Value Ref Range Status   Specimen Description BLOOD RAC  Final   Special Requests BOTTLES DRAWN AEROBIC AND ANAEROBIC 5CC  Final   Culture   Final    ESCHERICHIA COLI Note: Gram Stain Report Called to,Read Back By and Verified With: TAMMY PENNINGTON 12/22/14 @ 0950 BY [place] Performed at [facility]    Report Status PENDING  Incomplete  Blood culture (routine x 2)     Status: None (Preliminary result)   Collection Time: 12/21/14  4:48 PM  Result Value Ref Range Status   Specimen Description BLOOD LAC  Final   Special Requests BOTTLES DRAWN AEROBIC AND ANAEROBIC 5CC  Final   Culture   Final    ESCHERICHIA COLI Note: Gram Stain Report Called to,Read Back By and Verified With: TAMMY PENNINGTON [id number] BY [place] Performed at [facility]    Report Status PENDING  Incomplete  Urine culture     Status: None   Collection Time: 12/21/14  6:16 PM  Result Value Ref Range Status   Specimen Description URINE, CATHETERIZED  Final   Special Requests Immunocompromised  Final   Colony Count NO GROWTH Performed at [facility]   Final   Culture NO GROWTH Performed at [facility]   Final   Report Status 12/22/2014 FINAL  Final  Culture, blood (x 2)     Status: None (Preliminary result)   Collection Time: 12/21/14 10:08 PM  Result Value Ref Range Status   Specimen Description BLOOD RHAND  Final   Special Requests BOTTLES DRAWN AEROBIC AND ANAEROBIC 5CC  Final   Culture   Final           BLOOD CULTURE RECEIVED NO GROWTH TO DATE CULTURE WILL BE HELD FOR 5 DAYS BEFORE ISSUING A FINAL NEGATIVE REPORT Performed at [facility]    Report Status PENDING  Incomplete  Culture, blood (x  2)     Status: None (Preliminary result)   Collection Time: 12/21/14 10:27 PM  Result Value Ref Range Status   Specimen Description BLOOD LHAND  Final   Special Requests BOTTLES DRAWN AEROBIC ONLY 1CC  Final   Culture   Final           BLOOD CULTURE RECEIVED NO GROWTH TO DATE CULTURE WILL BE HELD FOR 5 DAYS BEFORE ISSUING A FINAL NEGATIVE REPORT Note: Culture results may be compromised due to an inadequate volume of blood received in culture bottles. Performed at [place] PENDING  Incomplete  MRSA PCR Screening     Status: None   Collection Time: 12/21/14 11:08 PM  Result Value Ref Range Status   MRSA by PCR NEGATIVE NEGATIVE Final    Comment:        The GeneXpert MRSA Assay (FDA approved for NASAL specimens only), is one component of a comprehensive MRSA colonization surveillance program. It is not intended to diagnose MRSA infection nor to guide or monitor treatment for MRSA infections.   Clostridium Difficile by PCR     Status: None   Collection Time: 12/21/14 11:58 PM  Result Value Ref Range Status   C difficile by pcr NEGATIVE NEGATIVE Final           Studies: US Abdomen Limited  12/22/2014   CLINICAL DATA:  Past history of alcoholic hepatitis. Followup minimal ascites to determine if more has accumulated.  EXAM: LIMITED ABDOMEN ULTRASOUND FOR ASCITES  TECHNIQUE: Limited ultrasound survey for ascites was performed in all four abdominal quadrants.  COMPARISON:  11/23/2014.  FINDINGS: On one of the images, there is questionable minimal ascites adjacent to the liver. Otherwise, no free peritoneal fluid is seen.  IMPRESSION: Questionable minimal ascites, significantly decreased.   Electronically Signed   By: Claudie Revering M.D.   On: 12/22/2014 09:42   Dg Chest Port 1 View  12/21/2014   CLINICAL DATA:  Altered mental status.  Fever.  EXAM: PORTABLE CHEST - 1 VIEW  COMPARISON:  12/03/2014  FINDINGS: Mild cardiomegaly. Low lung volumes with  bibasilar opacities likely atelectasis. No effusions or pneumothorax. Previously seen right rib fractures not visible on today's study.  IMPRESSION: Low lung volumes with bibasilar atelectasis.   Electronically Signed   By: Rolm Baptise M.D.   On: 12/21/2014 16:53        Scheduled Meds: . clopidogrel  75 mg Oral Daily  . folic acid  1 mg Oral Daily  . lactulose  10 g Oral TID  . multivitamin with minerals  1 tablet Oral Daily  . piperacillin-tazobactam (ZOSYN)  IV  3.375 g Intravenous 3 times per day  . rifaximin  550 mg Oral BID  . sodium chloride  3 mL Intravenous Q12H  . thiamine  100 mg Oral Daily   Continuous Infusions: . sodium chloride      Active Problems:   Essential hypertension   Atrial fibrillation   Ascites   Altered mental status   Cirrhosis with alcoholism   Hyponatremia   Altered mental state   Acute encephalopathy   Sepsis    Time spent: 73 minutes    Baker Kogler, MD, FACP, FHM. [facility] Pager [phone]  If 7PM-7AM, please contact night-coverage [email or web address] Password [email or web address] 12/23/2014, 1:41 PM    LOS: 2 days

## 2014-12-23 NOTE — Progress Notes (Signed)
   Reviewed situation with wife by phone and also spoke to Dr. Algis Liming today.  Agree with current care including pallaitive care consult.  Would most likely use SBP prohylaxis after Tx of Sepsis.  I will try to visit in next 1-2 days.  Gatha Mayer, MD, [facility] [phone] (pager) 12/23/2014 4:36 PM

## 2014-12-24 ENCOUNTER — Encounter (HOSPITAL_COMMUNITY): Payer: Self-pay | Admitting: Internal Medicine

## 2014-12-24 DIAGNOSIS — Z789 Other specified health status: Secondary | ICD-10-CM

## 2014-12-24 DIAGNOSIS — K7290 Hepatic failure, unspecified without coma: Secondary | ICD-10-CM | POA: Insufficient documentation

## 2014-12-24 DIAGNOSIS — R7881 Bacteremia: Secondary | ICD-10-CM

## 2014-12-24 DIAGNOSIS — K7682 Hepatic encephalopathy: Secondary | ICD-10-CM | POA: Insufficient documentation

## 2014-12-24 LAB — COMPREHENSIVE METABOLIC PANEL
ALBUMIN: 1.9 g/dL — AB (ref 3.5–5.0)
ALT: 34 U/L (ref 17–63)
AST: 74 U/L — AB (ref 15–41)
Alkaline Phosphatase: 105 U/L (ref 38–126)
Anion gap: 5 (ref 5–15)
BUN: 11 mg/dL (ref 6–20)
CALCIUM: 7.4 mg/dL — AB (ref 8.9–10.3)
CHLORIDE: 103 mmol/L (ref 101–111)
CO2: 24 mmol/L (ref 22–32)
Creatinine, Ser: 0.83 mg/dL (ref 0.61–1.24)
GFR calc Af Amer: >60 mL/min (ref >60)
GFR calc non Af Amer: >60 mL/min (ref >60)
GLUCOSE: 81 mg/dL (ref 65–99)
Potassium: 3.7 mmol/L (ref 3.5–5.1)
Sodium: 132 mmol/L — ABNORMAL LOW (ref 135–145)
TOTAL PROTEIN: 5.9 g/dL — AB (ref 6.5–8.1)
Total Bilirubin: 3.2 mg/dL — ABNORMAL HIGH (ref 0.3–1.2)

## 2014-12-24 LAB — CULTURE, BLOOD (ROUTINE X 2)

## 2014-12-24 LAB — AMMONIA: Ammonia: 33 umol/L (ref 9–35)

## 2014-12-24 LAB — CBC
HEMATOCRIT: 26.4 % — AB (ref 39.0–52.0)
HEMOGLOBIN: 8.9 g/dL — AB (ref 13.0–17.0)
MCH: 40.5 pg — ABNORMAL HIGH (ref 26.0–34.0)
MCHC: 33.7 g/dL (ref 30.0–36.0)
MCV: 120 fL — AB (ref 78.0–100.0)
Platelets: 140 10*3/uL — ABNORMAL LOW (ref 150–400)
RBC: 2.2 MIL/uL — ABNORMAL LOW (ref 4.22–5.81)
RDW: 15.5 % (ref 11.5–15.5)
WBC: 7 10*3/uL (ref 4.0–10.5)

## 2014-12-24 MED ORDER — FENTANYL CITRATE (PF) 100 MCG/2ML IJ SOLN
25.0000 ug | INTRAMUSCULAR | Status: DC | PRN
  Administered 2014-12-24 (×3): 25 ug via INTRAVENOUS
  Filled 2014-12-24 (×3): qty 2

## 2014-12-24 MED ORDER — CEFTRIAXONE SODIUM IN DEXTROSE 40 MG/ML IV SOLN
2.0000 g | INTRAVENOUS | Status: DC
  Administered 2014-12-24 – 2014-12-25 (×2): 2 g via INTRAVENOUS
  Filled 2014-12-24 (×3): qty 50

## 2014-12-24 NOTE — Progress Notes (Signed)
   I saw the patient is wife son and daughter today. 15 minutes of time spent with them reviewing his situation of alcoholic cirrhosis and associated problems. Wife and requested that I be involved in care.  I had previously made it clear to a phone call that I agree with his care at this time.  The patient is clear and is desirous for palliative and hospice care. This is unsettling to his family and they're struggling to accept it.  He is clearly improved since admission.  I explained that his Escherichia coli bacteremia should be treatable. His liver disease is better than it was last month. Really Audelia Hives is down. It's possible Xtandi contributed some to his rising bilirubin. He has not been drinking.  His child Pugh score is 12 which gives him a class of C and a life expectancy of 1-3 years. His son was asking about meld scores also. I explained that that had some prognostic information as well but that I had not calculated that.  I explained that I had full confidence in the palliative care team and there are plans for medical therapy of his pain.  He wants to alleviate his suffering. It certainly possible he could improve and reconsider his decision towards no treatment of his prostate cancer but that will be up to him. His family is trying to deal with this.  I recommend SBP prophylaxis for him even though we have not proven that - I suggest trimetoprim Sulfa DS 1 daily after treatment of E coli bacteremia is completed.  Alternative would be daily cipro.  Please call if ?  Gatha Mayer, MD, [facility] [phone] (pager) 12/24/2014 1:34 PM

## 2014-12-24 NOTE — Progress Notes (Signed)
PROGRESS NOTE    Allen Schroeder NOI:[id number] DOB: [birth year] DOA: 12/21/2014 PCP: [name], MD  Primary Oncologist: Dr. Zola Button Primary GI: Dr. Silvano Rusk Primary Cardiologist: Dr. Sherren Mocha.  HPI/Brief narrative 64 y.o. 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 [name]), was on [facility] which was stopped because patient developed jaundiced although he has had an excellent response to Xtandi at least in regards to monitored PSA values. Of note, last CT scan in 11/2014 showed sclerotic lesions from metastatic disease and new sclerotic lesion in L2 area. Further history includes CAD, atrial fibrillation, alcohol abuse, hypertension, recent hospitalization in 11/2014 for fall at which time he sustained rib fractures.  Patient presented from home to [facility] with worsening mental status changes, confusion and fevers. On admission, blood pressure was 77/44, HR 98, RR 16 - 30, T max 102.9 F and oxygen saturation as low as 89% which has improved with [place] oxygen support. Blood work was notable for leukocytosis of 14.9, hemoglobin 10.8, sodium 127, normal creatinine, INR 1.57. Lactic acid was 2.41 and procalcitonin 0.95. Ammonia level was about 40. Chest x-ray showed low lung volumes with bibasilar atelectasis.   Patient was started on broad-spectrum antibiotics, vanco and zosyn for sepsis although no clear source of infection identified at the time of the admission (now we know his blood cultures are growing gram negative rods). He was admitted to stepdown unit because of hypotension.     Assessment/Plan:  Principal problem: Sepsis secondary to Escherichia coli bacteremia - Sepsis criteria met on the admission with hypotension, tachycardia, tachypnea, fever, hypoxia. Lactic acid was 2.41 and procalcitonin 0.95.  - Source not clear.? GI translocation. Urine culture negative. C. difficile PCR  negative. Not much ascitic fluid and no symptoms to suggest SBP.  - Patient was admitted to SDU and started on broad-spectrum antibiotics, vancomycin and Zosyn. Vancomycin discontinued. - Clinically improved with temperature defervesced - Mild hypotension has improved with brief IV fluids. - Escherichia coli is pansensitive: Change IV Zosyn to Rocephin and eventually DC home on oral Cipro/Bactrim complete total 2 weeks treatment. Following treatment for bacteremia, as per GI recommendations-keep on Bactrim prophylaxis for SBP.  Active Problem: Acute respiratory failure with hypoxia - Likely secondary to atelectasis in the context of acute hepatic encephalopathy. - Low index of suspicion for pneumonia. - Chest x-ray on the admission did not reveal acute cardiopulmonary findings other than bibasilar atelectasis. - Resolved  History of prostate cancer with bone metastasis - Follows with Dr. [name] on oncology - Last CT abdomen/pelvis in April 2016 with sclerotic lesions consistent with metastasis and there was a new sclerotic lesion in L2 area. - Oncology input 5/19 appreciated: Recommended discontinuing Xtandi  Anemia of chronic disease -Related to bone marrow failure from alcohol abuse and history of malignancy - Follow CBC in a.m. and transfuse for hemoglobin < 7 g per DL. - Drop in hemoglobin from 10.8 > 8.1 may be related to critical illness and hemodilution. No reported bleeding. - stable  Hypokalemia - Replaced  Hyponatremia - Likely related to liver cirrhosis. - Improved/stable  Alcoholic cirrhosis with coagulopathy - Abdominal US on this admission showed questionable minimal ascites. - Limited in terms of giving lasix, spironolactone and sotalol due to hypotension - GI input 5/19 appreciated: Child Pugh score 12 which gives him a class of C and life expectancy of 1-3 years. Believes Xtandi contributed to rising bilirubin. GI states that it is  possible he could improve and may  reconsider decisions regarding cancer treatment.  Paroxysmal Atrial fibrillation - CHADS vasc score at least 3 - Sotalol on hold due to hypotension - Continue Plavix daily  - Not on other anticoagulation due to risk of falls and alcohol abuse   Acute hepatic encephalopathy - Secondary to acute illness complicating alcoholic cirrhosis. Ammonia level is around 40 on this admission. - Continue lactulose 10 g 3 times daily and rifaximin added - Mental status improving but still has mild asterixis. - Minimize opioids and sedative medications  Right-sided pleuritic/musculoskeletal chest pain - Related to prior rib fractures. - Chest x-ray 5/16 showed bibasilar atelectasis but could not see previous rib fractures - Pain management: Minimize opioids.  - Added lidocaine patch with better pain control.  Failure to thrive - Secondary to alcoholic cirrhosis and metastatic prostate cancer. - Overall prognosis from liver cirrhosis is poor.  - Patient keen to go on hospice. Family not sure but are agreeable to palliative care consult for goals of care.  - Awaiting palliative care input/recommendations.  History of alcohol abuse - As per family, patient has not had alcohol intake since 11/17/14 - No overt withdrawal features.    Code Status: Full Family Communication: Discussed extensively with patient's spouse and daughter at bedside on 5/18. None at bedside today. Disposition Plan: To be determined. Continue treatment in stepdown unit for additional 24 hours for close monitoring.   Consultants:  CCM-signed off 5/17  Palliative care team   GI   Oncology   Procedures:  Rectal tube  Condom cath  Antibiotics:  IV cefepime 1 dose on 5/16  IV Zosyn 5/16 >  IV vancomycin 2 doses on 5/16  Rifaximin 5/17>  Subjective: ? Right-sided rib cage pain/possiblt LBP better since last night.  Objective: Filed Vitals:   12/24/14 0500 12/24/14 0600 12/24/14 0800  12/24/14 1200  BP:   92/47 105/61  Pulse: 75 77 76 78  Temp:   98.6 F (37 C) 98.7 F (37.1 C)  TempSrc:   Oral Oral  Resp: _0 Height:      Weight:      SpO2: 98% 95% 98% 100%    Intake/Output Summary (Last 24 hours) at 12/24/14 1409 Last data filed at 12/24/14 0800  Gross per 24 hour  Intake    739 ml  Output   1200 ml  Net   -461 ml   Filed Weights   12/21/14 2300 12/23/14 0500 12/24/14 0406  Weight: 92.2 kg (203 lb 4.2 oz) 95 kg (209 lb 7 oz) 96 kg (211 lb 10.3 oz)     Exam:  General exam: Moderately built and nourished middle-aged chronically ill-looking male sitting up comfortably in bed without distress Respiratory system: Reduced breath sounds in the bases but otherwise clear to auscultation. No increased work of breathing. Cardiovascular system: S1 & S2 heard, RRR. No JVD, murmurs, gallops, clicks. Trace pedal edema. Telemetry: Sinus rhythm   Gastrointestinal system: Abdomen is  mildly distended, soft and nontender. Normal bowel sounds heard. Central nervous system: Alert and oriented 3 . No focal neurological deficits. asterixis +  Extremities: Symmetric 5 x 5 power.   Data Reviewed: Basic Metabolic Panel:  Recent Labs Lab 12/21/14 2227 12/22/14 0415 12/22/14 1617 12/23/14 0312 12/24/14 0510  NA 133* 133* 134* 135 132*  K 3.6 3.4* 3.8 3.7 3.7  CL 106 107 107 105 103  CO2 19* 17* 21* 24 24  GLUCOSE 99 93 120* 92  81  BUN _0 CREATININE 1.03 0.89 1.05 0.97 0.83  CALCIUM 7.4* 6.8* 7.1* 7.2* 7.4*   Liver Function Tests:  Recent Labs Lab 12/21/14 1706 12/21/14 2227 12/22/14 0415 12/24/14 0510  AST 104* 83* 77* 74*  ALT 41 32 33 34  ALKPHOS 150* 117 107 105  BILITOT 5.3* 4.8* 4.9* 3.2*  PROT 8.0 6.4* 6.3* 5.9*  ALBUMIN 2.7* 2.3* 2.1* 1.9*   No results for input(s): LIPASE, AMYLASE in the last 168 hours.  Recent Labs Lab 12/21/14 1705 12/24/14 0510  AMMONIA 41* 33   CBC:  Recent Labs Lab 12/21/14 1706  12/21/14 2227 12/22/14 0415 12/23/14 1700 12/24/14 0510  WBC 14.9* 14.1* 15.0* 7.8 7.0  NEUTROABS  --  12.3*  --   --   --   HGB 10.8* 9.2* 8.1* 9.3* 8.9*  HCT 32.2* 27.6* 24.7* 27.6* 26.4*  MCV 121.1* 122.1* 121.1* 120.5* 120.0*  PLT 241 202 164 153 140*   Cardiac Enzymes: No results for input(s): CKTOTAL, CKMB, CKMBINDEX, TROPONINI in the last 168 hours. BNP (last 3 results) No results for input(s): PROBNP in the last 8760 hours. CBG:  Recent Labs Lab 12/22/14 0737 12/23/14 0745  GLUCAP 96 89    Recent Results (from the past 240 hour(s))  Blood culture (routine x 2)     Status: None   Collection Time: 12/21/14  4:48 PM  Result Value Ref Range Status   Specimen Description BLOOD RAC  Final   Special Requests BOTTLES DRAWN AEROBIC AND ANAEROBIC 5CC  Final   Culture   Final    ESCHERICHIA COLI Note: SUSCEPTIBILITIES PERFORMED ON PREVIOUS CULTURE WITHIN THE LAST 5 DAYS. Note: Gram Stain Report Called to,Read Back By and Verified With: TAMMY PENNINGTON 12/22/14 @ 0950 BY PARDA Performed at [facility]    Report Status 12/24/2014 FINAL  Final  Blood culture (routine x 2)     Status: None   Collection Time: 12/21/14  4:48 PM  Result Value Ref Range Status   Specimen Description BLOOD LAC  Final   Special Requests BOTTLES DRAWN AEROBIC AND ANAEROBIC 5CC  Final   Culture   Final    ESCHERICHIA COLI Note: Gram Stain Report Called to,Read Back By and Verified With: TAMMY PENNINGTON 1118 ON [id number] BY [place] Performed at [facility]    Report Status 12/24/2014 FINAL  Final   Organism ID, Bacteria ESCHERICHIA COLI  Final      Susceptibility   Escherichia coli - MIC*    AMPICILLIN <=2 SENSITIVE Sensitive     AMPICILLIN/SULBACTAM <=2 SENSITIVE Sensitive     CEFAZOLIN <=4 SENSITIVE Sensitive     CEFEPIME <=1 SENSITIVE Sensitive     CEFTAZIDIME <=1 SENSITIVE Sensitive     CEFTRIAXONE <=1 SENSITIVE Sensitive     CIPROFLOXACIN <=0.25 SENSITIVE Sensitive      GENTAMICIN <=1 SENSITIVE Sensitive     IMIPENEM <=0.25 SENSITIVE Sensitive     PIP/TAZO <=4 SENSITIVE Sensitive     TOBRAMYCIN <=1 SENSITIVE Sensitive     TRIMETH/SULFA <=20 SENSITIVE Sensitive     * ESCHERICHIA COLI  Urine culture     Status: None   Collection Time: 12/21/14  6:16 PM  Result Value Ref Range Status   Specimen Description URINE, CATHETERIZED  Final   Special Requests Immunocompromised  Final   Colony Count NO GROWTH Performed at [facility]   Final   Culture NO GROWTH Performed at [facility]   Final  Report Status 12/22/2014 FINAL  Final  Culture, blood (x 2)     Status: None (Preliminary result)   Collection Time: 12/21/14 10:08 PM  Result Value Ref Range Status   Specimen Description BLOOD RHAND  Final   Special Requests BOTTLES DRAWN AEROBIC AND ANAEROBIC 5CC  Final   Culture   Final           BLOOD CULTURE RECEIVED NO GROWTH TO DATE CULTURE WILL BE HELD FOR 5 DAYS BEFORE ISSUING A FINAL NEGATIVE REPORT Performed at [facility]    Report Status PENDING  Incomplete  Culture, blood (x 2)     Status: None (Preliminary result)   Collection Time: 12/21/14 10:27 PM  Result Value Ref Range Status   Specimen Description BLOOD LHAND  Final   Special Requests BOTTLES DRAWN AEROBIC ONLY 1CC  Final   Culture   Final           BLOOD CULTURE RECEIVED NO GROWTH TO DATE CULTURE WILL BE HELD FOR 5 DAYS BEFORE ISSUING A FINAL NEGATIVE REPORT Note: Culture results may be compromised due to an inadequate volume of blood received in culture bottles. Performed at [facility]    Report Status PENDING  Incomplete  MRSA PCR Screening     Status: None   Collection Time: 12/21/14 11:08 PM  Result Value Ref Range Status   MRSA by PCR NEGATIVE NEGATIVE Final    Comment:        The GeneXpert MRSA Assay (FDA approved for NASAL specimens only), is one component of a comprehensive MRSA colonization surveillance program. It is not intended to  diagnose MRSA infection nor to guide or monitor treatment for MRSA infections.   Clostridium Difficile by PCR     Status: None   Collection Time: 12/21/14 11:58 PM  Result Value Ref Range Status   C difficile by pcr NEGATIVE NEGATIVE Final           Studies: No results found.      Scheduled Meds: . clopidogrel  75 mg Oral Daily  . folic acid  1 mg Oral Daily  . lactulose  10 g Oral TID  . lidocaine  1 patch Transdermal Q24H  . multivitamin with minerals  1 tablet Oral Daily  . piperacillin-tazobactam (ZOSYN)  IV  3.375 g Intravenous 3 times per day  . rifaximin  550 mg Oral BID  . sodium chloride  3 mL Intravenous Q12H  . thiamine  100 mg Oral Daily   Continuous Infusions: . sodium chloride      Active Problems:   Essential hypertension   Atrial fibrillation   Ascites   Altered mental status   Cirrhosis with alcoholism   Hyponatremia   Altered mental state   Acute encephalopathy   Sepsis    Time spent: 53 minutes    Sharelle Burditt, MD, FACP, FHM. [facility] Pager [phone]  If 7PM-7AM, please contact night-coverage [email or web address] Password [email or web address] 12/24/2014, 2:09 PM    LOS: 3 days

## 2014-12-24 NOTE — Progress Notes (Signed)
ANTIBIOTIC CONSULT NOTE  Pharmacy Consult for Ceftriaxone Indication: sepsis, E.coli bacteremia  No Known Allergies  Patient Measurements: Height: 6\' 2"  (188 cm) Weight: 211 lb 10.3 oz (96 kg) IBW/kg (Calculated) : 82.2   Vital Signs: Temp: 98.7 F (37.1 C) (05/19 1200) Temp Source: Oral (05/19 1200) BP: 105/61 mmHg (05/19 1200) Pulse Rate: 78 (05/19 1200) Intake/Output from previous day: 05/18 0701 - 05/19 0700 In: 789 [I.V.:639; IV Piggyback:150] Out: 1385 [Urine:1310; Stool:75] Intake/Output from this shift: Total I/O In: -  Out: 275 [Urine:250; Stool:25]  Labs:  Recent Labs  12/22/14 0415 12/22/14 1617 12/23/14 0312 12/23/14 1700 12/24/14 0510  WBC 15.0*  --   --  7.8 7.0  HGB 8.1*  --   --  9.3* 8.9*  PLT 164  --   --  153 140*  CREATININE 0.89 1.05 0.97  --  0.83   Estimated Creatinine Clearance: 101.8 mL/min (by C-G formula based on Cr of 0.83). No results for input(s): VANCOTROUGH, VANCOPEAK, VANCORANDOM, GENTTROUGH, GENTPEAK, GENTRANDOM, TOBRATROUGH, TOBRAPEAK, TOBRARND, AMIKACINPEAK, AMIKACINTROU, AMIKACIN in the last 72 hours.   Microbiology: Recent Results (from the past 720 hour(s))  Blood culture (routine x 2)     Status: None   Collection Time: 12/21/14  4:48 PM  Result Value Ref Range Status   Specimen Description BLOOD RAC  Final   Special Requests BOTTLES DRAWN AEROBIC AND ANAEROBIC 5CC  Final   Culture   Final    ESCHERICHIA COLI Note: SUSCEPTIBILITIES PERFORMED ON PREVIOUS CULTURE WITHIN THE LAST 5 DAYS. Note: Gram Stain Report Called to,Read Back By and Verified With: TAMMY PENNINGTON 12/22/14 @ 0950 BY PARDA Performed at [facility]    Report Status 12/24/2014 FINAL  Final  Blood culture (routine x 2)     Status: None   Collection Time: 12/21/14  4:48 PM  Result Value Ref Range Status   Specimen Description BLOOD LAC  Final   Special Requests BOTTLES DRAWN AEROBIC AND ANAEROBIC 5CC  Final   Culture   Final    ESCHERICHIA  COLI Note: Gram Stain Report Called to,Read Back By and Verified With: TAMMY PENNINGTON 1118 ON [id number] BY [place] Performed at [facility]    Report Status 12/24/2014 FINAL  Final   Organism ID, Bacteria ESCHERICHIA COLI  Final      Susceptibility   Escherichia coli - MIC*    AMPICILLIN <=2 SENSITIVE Sensitive     AMPICILLIN/SULBACTAM <=2 SENSITIVE Sensitive     CEFAZOLIN <=4 SENSITIVE Sensitive     CEFEPIME <=1 SENSITIVE Sensitive     CEFTAZIDIME <=1 SENSITIVE Sensitive     CEFTRIAXONE <=1 SENSITIVE Sensitive     CIPROFLOXACIN <=0.25 SENSITIVE Sensitive     GENTAMICIN <=1 SENSITIVE Sensitive     IMIPENEM <=0.25 SENSITIVE Sensitive     PIP/TAZO <=4 SENSITIVE Sensitive     TOBRAMYCIN <=1 SENSITIVE Sensitive     TRIMETH/SULFA <=20 SENSITIVE Sensitive     * ESCHERICHIA COLI  Urine culture     Status: None   Collection Time: 12/21/14  6:16 PM  Result Value Ref Range Status   Specimen Description URINE, CATHETERIZED  Final   Special Requests Immunocompromised  Final   Colony Count NO GROWTH Performed at [facility]   Final   Culture NO GROWTH Performed at [facility]   Final   Report Status 12/22/2014 FINAL  Final  Culture, blood (x 2)     Status: None (Preliminary result)   Collection Time: 12/21/14  10:08 PM  Result Value Ref Range Status   Specimen Description BLOOD RHAND  Final   Special Requests BOTTLES DRAWN AEROBIC AND ANAEROBIC 5CC  Final   Culture   Final           BLOOD CULTURE RECEIVED NO GROWTH TO DATE CULTURE WILL BE HELD FOR 5 DAYS BEFORE ISSUING A FINAL NEGATIVE REPORT Performed at [facility]    Report Status PENDING  Incomplete  Culture, blood (x 2)     Status: None (Preliminary result)   Collection Time: 12/21/14 10:27 PM  Result Value Ref Range Status   Specimen Description BLOOD LHAND  Final   Special Requests BOTTLES DRAWN AEROBIC ONLY 1CC  Final   Culture   Final           BLOOD CULTURE RECEIVED NO GROWTH TO DATE  CULTURE WILL BE HELD FOR 5 DAYS BEFORE ISSUING A FINAL NEGATIVE REPORT Note: Culture results may be compromised due to an inadequate volume of blood received in culture bottles. Performed at [facility]    Report Status PENDING  Incomplete  MRSA PCR Screening     Status: None   Collection Time: 12/21/14 11:08 PM  Result Value Ref Range Status   MRSA by PCR NEGATIVE NEGATIVE Final    Comment:        The GeneXpert MRSA Assay (FDA approved for NASAL specimens only), is one component of a comprehensive MRSA colonization surveillance program. It is not intended to diagnose MRSA infection nor to guide or monitor treatment for MRSA infections.   Clostridium Difficile by PCR     Status: None   Collection Time: 12/21/14 11:58 PM  Result Value Ref Range Status   C difficile by pcr NEGATIVE NEGATIVE Final    Medical History: Past Medical History  Diagnosis Date  . Atrial fibrillation   . Other and unspecified hyperlipidemia   . Unspecified essential hypertension   . Malignant neoplasm of prostate 11/2007    Mets to Bone  . Elevated prostate specific antigen (PSA)   . Personal history of colonic polyps   . Hypocalcemia   . Acute alcoholic hepatitis   . Essential and other specified forms of tremor   . Anxiety state, unspecified   . Family history of malignant neoplasm of gastrointestinal tract   . Alcohol abuse with physiological dependence 02/13/2012  . 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 Alcoholic hepatitis with ascites 11/13/2014   Assessment: 49 YOM on Zosyn per pharmacy for sepsis 2/2 E.coli bacteremia, now narrowing to Ceftriaxone per sensitivities with pharmacy requested to assist with dosing.  GI recommending Septra 1 DS tablet daily for SBP ppx after tx of E.coli bacteremia for SBP ppx.  5/16 >> Cefepime >> 5/16 5/16 >> vanco >> 5/17 5/16 >> pip/tazo >> 5/19 5/19 >> CTX  >>  5/16 blood x2: E.coli (pan-sensitive) 5/16 blood x2: NGTD  5/16 urine: NGF  5/16 cdiff PCR (-) 5/16 MRSA PCR (-)  Goal of Therapy:  Appropriate antibiotic dosing for indication Eradication of infection  Plan:   Ceftriaxone 2g IV q24h for E.coli bacteremia.  No dose adjustment needed, therefore will sign off.  Thank you for the consult.   Lindell Spar, PharmD, BCPS Pager: [phone] 12/24/2014 2:42 PM

## 2014-12-24 NOTE — Progress Notes (Signed)
ANTIBIOTIC CONSULT NOTE - Follow Up  Pharmacy Consult for Zosyn Indication: sepsis, E.coli bacteremia  No Known Allergies  Patient Measurements: Height: 6\' 2"  (188 cm) Weight: 211 lb 10.3 oz (96 kg) IBW/kg (Calculated) : 82.2 Adjusted Body Weight:   Vital Signs: Temp: 98.7 F (37.1 C) (05/19 1200) Temp Source: Oral (05/19 1200) BP: 105/61 mmHg (05/19 1200) Pulse Rate: 78 (05/19 1200) Intake/Output from previous day: 05/18 0701 - 05/19 0700 In: 789 [I.V.:639; IV Piggyback:150] Out: 1385 [Urine:1310; Stool:75] Intake/Output from this shift: Total I/O In: -  Out: 275 [Urine:250; Stool:25]  Labs:  Recent Labs  12/22/14 0415 12/22/14 1617 12/23/14 0312 12/23/14 1700 12/24/14 0510  WBC 15.0*  --   --  7.8 7.0  HGB 8.1*  --   --  9.3* 8.9*  PLT 164  --   --  153 140*  CREATININE 0.89 1.05 0.97  --  0.83   Estimated Creatinine Clearance: 101.8 mL/min (by C-G formula based on Cr of 0.83). No results for input(s): VANCOTROUGH, VANCOPEAK, VANCORANDOM, GENTTROUGH, GENTPEAK, GENTRANDOM, TOBRATROUGH, TOBRAPEAK, TOBRARND, AMIKACINPEAK, AMIKACINTROU, AMIKACIN in the last 72 hours.   Microbiology: Recent Results (from the past 720 hour(s))  Blood culture (routine x 2)     Status: None   Collection Time: 12/21/14  4:48 PM  Result Value Ref Range Status   Specimen Description BLOOD RAC  Final   Special Requests BOTTLES DRAWN AEROBIC AND ANAEROBIC 5CC  Final   Culture   Final    ESCHERICHIA COLI Note: SUSCEPTIBILITIES PERFORMED ON PREVIOUS CULTURE WITHIN THE LAST 5 DAYS. Note: Gram Stain Report Called to,Read Back By and Verified With: TAMMY PENNINGTON 12/22/14 @ 0950 BY PARDA Performed at [facility]    Report Status 12/24/2014 FINAL  Final  Blood culture (routine x 2)     Status: None   Collection Time: 12/21/14  4:48 PM  Result Value Ref Range Status   Specimen Description BLOOD LAC  Final   Special Requests BOTTLES DRAWN AEROBIC AND ANAEROBIC 5CC  Final   Culture   Final    ESCHERICHIA COLI Note: Gram Stain Report Called to,Read Back By and Verified With: TAMMY PENNINGTON [id number] ON [id number] BY [place] Performed at [facility]    Report Status 12/24/2014 FINAL  Final   Organism ID, Bacteria ESCHERICHIA COLI  Final      Susceptibility   Escherichia coli - MIC*    AMPICILLIN <=2 SENSITIVE Sensitive     AMPICILLIN/SULBACTAM <=2 SENSITIVE Sensitive     CEFAZOLIN <=4 SENSITIVE Sensitive     CEFEPIME <=1 SENSITIVE Sensitive     CEFTAZIDIME <=1 SENSITIVE Sensitive     CEFTRIAXONE <=1 SENSITIVE Sensitive     CIPROFLOXACIN <=0.25 SENSITIVE Sensitive     GENTAMICIN <=1 SENSITIVE Sensitive     IMIPENEM <=0.25 SENSITIVE Sensitive     PIP/TAZO <=4 SENSITIVE Sensitive     TOBRAMYCIN <=1 SENSITIVE Sensitive     TRIMETH/SULFA <=20 SENSITIVE Sensitive     * ESCHERICHIA COLI  Urine culture     Status: None   Collection Time: 12/21/14  6:16 PM  Result Value Ref Range Status   Specimen Description URINE, CATHETERIZED  Final   Special Requests Immunocompromised  Final   Colony Count NO GROWTH Performed at [facility]   Final   Culture NO GROWTH Performed at [facility]   Final   Report Status 12/22/2014 FINAL  Final  Culture, blood (x 2)     Status: None (Preliminary result)  Collection Time: 12/21/14 10:08 PM  Result Value Ref Range Status   Specimen Description BLOOD RHAND  Final   Special Requests BOTTLES DRAWN AEROBIC AND ANAEROBIC 5CC  Final   Culture   Final           BLOOD CULTURE RECEIVED NO GROWTH TO DATE CULTURE WILL BE HELD FOR 5 DAYS BEFORE ISSUING A FINAL NEGATIVE REPORT Performed at [facility]    Report Status PENDING  Incomplete  Culture, blood (x 2)     Status: None (Preliminary result)   Collection Time: 12/21/14 10:27 PM  Result Value Ref Range Status   Specimen Description BLOOD LHAND  Final   Special Requests BOTTLES DRAWN AEROBIC ONLY 1CC  Final   Culture   Final           BLOOD CULTURE  RECEIVED NO GROWTH TO DATE CULTURE WILL BE HELD FOR 5 DAYS BEFORE ISSUING A FINAL NEGATIVE REPORT Note: Culture results may be compromised due to an inadequate volume of blood received in culture bottles. Performed at [facility]    Report Status PENDING  Incomplete  MRSA PCR Screening     Status: None   Collection Time: 12/21/14 11:08 PM  Result Value Ref Range Status   MRSA by PCR NEGATIVE NEGATIVE Final    Comment:        The GeneXpert MRSA Assay (FDA approved for NASAL specimens only), is one component of a comprehensive MRSA colonization surveillance program. It is not intended to diagnose MRSA infection nor to guide or monitor treatment for MRSA infections.   Clostridium Difficile by PCR     Status: None   Collection Time: 12/21/14 11:58 PM  Result Value Ref Range Status   C difficile by pcr NEGATIVE NEGATIVE Final    Medical History: Past Medical History  Diagnosis Date  . Atrial fibrillation   . Other and unspecified hyperlipidemia   . Unspecified essential hypertension   . Malignant neoplasm of prostate 11/2007    Mets to Bone  . Elevated prostate specific antigen (PSA)   . Personal history of colonic polyps   . Hypocalcemia   . Acute alcoholic hepatitis   . Essential and other specified forms of tremor   . Anxiety state, unspecified   . Family history of malignant neoplasm of gastrointestinal tract   . Alcohol abuse with physiological dependence 02/13/2012  . 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 Alcoholic hepatitis with ascites 11/13/2014   Assessment: 50 YOM on Zosyn per pharmacy for sepsis 2/2 E.coli bacteremia.  GI recommending Septra 1 DS tablet daily for SBP ppx after tx of E.coli bacteremia for SBP ppx.  5/16 >> Cefepime >> 5/16 5/16 >> vanco >> 5/17 5/16 >> pip/tazo >>  5/16 blood x2: E.coli (pan-sensitive) 5/16 blood x2: NGTD  5/16 urine: NGF  5/16  cdiff PCR (-) 5/16 MRSA PCR (-)  Goal of Therapy:  Doses adjusted per renal function Eradication of infection  Plan:  Continue Zosyn 3.375g IV q8h (4 hour infusion time).  E.coli blood culture is pan-sensitive.  Consider narrowing antibiotic.  Hershal Coria, PharmD, BCPS Pager: [phone] 12/24/2014 2:03 PM

## 2014-12-24 NOTE — Progress Notes (Signed)
Events in the last few days noted. The case was discussed with Dr. Algis Liming as well. Patient is desiring hospice care moving forward. I have no objections at this time. His prostate cancer although was under reasonable control with the last PSA of 0.01, he still has an incurable malignancy. Given his hepatic failure I see no point of continuing his prostate cancer care at this time.  I have recommended discontinuing Xtandi on proceeding with hospice care. I believe his limited life expectancy is related to his liver disease more than his cancer.  I have attempted to call his wife yesterday 12/23/2014 and was not able to reach her and I will try her again today to update her regarding my recommendations.  Please do not hesitate to call at any time with questions regarding Allen Schroeder.[name]

## 2014-12-24 NOTE — Progress Notes (Signed)
Palliative Medicine consult received. Family meeting scheduled for 5/19 at [place], [place]

## 2014-12-24 NOTE — Progress Notes (Signed)
Physical Therapy Evaluation Patient Details Name: Allen Schroeder MRN: [id number] DOB: 10/03/1950 Today's Date: 12/24/2014   History of Present Illness  Allen Schroeder is a 64 y.o. male with cirrhosis of the liver with alcohol abuse, atrial fibrillation, metastatic prostate cancer, fall in April 2016 with rib fxs and HTN presents to the ED with altered mental status. Dx of sepsis, respiratory failure, hepatic failure.   Clinical Impression  Pt admitted with above diagnosis. Pt currently with functional limitations due to the deficits listed below (see PT Problem List). +2 assist for bed to recliner with RW. Pt fatigues quickly. At present he needs 24* assist. SNF vs. Home with 24* assist from family recommended.  Pt will benefit from skilled PT to increase their independence and safety with mobility to allow discharge to the venue listed below.   2    Follow Up Recommendations SNF (SNF vs home if family able to provide 14* assist)    Equipment Recommendations  None recommended by PT    Recommendations for Other Services       Precautions / Restrictions Precautions Precautions: Fall Precaution Comments: fell in April, sustained R rib fxs Restrictions Weight Bearing Restrictions: No      Mobility  Bed Mobility Overal bed mobility: Needs Assistance Bed Mobility: Sidelying to Sit;Rolling Rolling: Mod assist Sidelying to sit: Max assist;+2 for physical assistance       General bed mobility comments: assist to raise trunk, verbal/tactile cues for technique  Transfers Overall transfer level: Needs assistance Equipment used: Rolling walker (2 wheeled) Transfers: Sit to/from Stand Sit to Stand: Mod assist;+2 physical assistance;+2 safety/equipment Stand pivot transfers: Min assist;+2 safety/equipment;+2 physical assistance       General transfer comment: verbal cues for hand placement, assist to rise and steady, pt fatigued quickly so deferred ambulation  Ambulation/Gait                 Stairs            Wheelchair Mobility    Modified Rankin (Stroke Patients Only)       Balance Overall balance assessment: Needs assistance   Sitting balance-Leahy Scale: Good       Standing balance-Leahy Scale: Poor                               Pertinent Vitals/Pain Pain Assessment: No/denies pain    Home Living Family/patient expects to be discharged to:: Private residence Living Arrangements: Spouse/significant other Available Help at Discharge: Family Type of Home: House Home Access: Stairs to enter   Technical brewer of Steps: 3-4 Home Layout: Two level;Bed/bath upstairs Home Equipment: Walker - 4 wheels;Bedside commode;Hospital bed;Wheelchair - manual      Prior Function Level of Independence: Needs assistance   Gait / Transfers Assistance Needed: walked with rollator with assist  ADL's / Homemaking Assistance Needed: assist needed        Hand Dominance        Extremity/Trunk Assessment   Upper Extremity Assessment: Overall WFL for tasks assessed           Lower Extremity Assessment: Generalized weakness (4/5 B knee extension but fatigues quickly in standing, decreased sensation to light touch B feet)      Cervical / Trunk Assessment: Normal  Communication   Communication: No difficulties (little slow to process and slow to speak)  Cognition Arousal/Alertness: Awake/alert Behavior During Therapy: WFL for tasks assessed/performed Overall Cognitive Status: Impaired/Different from  baseline Area of Impairment: Memory                    General Comments      Exercises        Assessment/Plan    PT Assessment Patient needs continued PT services  PT Diagnosis Difficulty walking;Generalized weakness   PT Problem List Decreased strength;Decreased activity tolerance;Decreased balance;Decreased mobility;Decreased knowledge of use of DME;Decreased safety awareness;Pain  PT Treatment  Interventions DME instruction;Gait training;Stair training;Functional mobility training;Therapeutic activities;Therapeutic exercise;Patient/family education;Neuromuscular re-education;Wheelchair mobility training   PT Goals (Current goals can be found in the Care Plan section) Acute Rehab PT Goals Patient Stated Goal: to walk PT Goal Formulation: With patient Time For Goal Achievement: 01/07/15 Potential to Achieve Goals: Good    Frequency Min 3X/week   Barriers to discharge        Co-evaluation               End of Session Equipment Utilized During Treatment: Gait belt Activity Tolerance: Patient limited by fatigue Patient left: in chair;with call bell/phone within reach;with chair alarm set Nurse Communication: Mobility status (NT assisted with pericare with transfers, aware pt in recliner)         Time: 3818-4037 PT Time Calculation (min) (ACUTE ONLY): 25 min   Charges:   PT Evaluation $Initial PT Evaluation Tier I: 1 Procedure PT Treatments $Therapeutic Activity: 8-22 mins   PT G Codes:        Philomena Doheny 12/24/2014, 2:01 PM [phone]

## 2014-12-25 ENCOUNTER — Encounter (HOSPITAL_COMMUNITY): Payer: Self-pay

## 2014-12-25 ENCOUNTER — Inpatient Hospital Stay (HOSPITAL_COMMUNITY): Payer: Medicare Other

## 2014-12-25 DIAGNOSIS — I4891 Unspecified atrial fibrillation: Secondary | ICD-10-CM

## 2014-12-25 LAB — CBC
HCT: 25.1 % — ABNORMAL LOW (ref 39.0–52.0)
Hemoglobin: 8.8 g/dL — ABNORMAL LOW (ref 13.0–17.0)
MCH: 41.5 pg — AB (ref 26.0–34.0)
MCHC: 35.1 g/dL (ref 30.0–36.0)
MCV: 118.4 fL — AB (ref 78.0–100.0)
Platelets: 139 10*3/uL — ABNORMAL LOW (ref 150–400)
RBC: 2.12 MIL/uL — ABNORMAL LOW (ref 4.22–5.81)
RDW: 15.3 % (ref 11.5–15.5)
WBC: 8.1 10*3/uL (ref 4.0–10.5)

## 2014-12-25 LAB — COMPREHENSIVE METABOLIC PANEL
ALBUMIN: 2 g/dL — AB (ref 3.5–5.0)
ALT: 32 U/L (ref 17–63)
AST: 81 U/L — AB (ref 15–41)
Alkaline Phosphatase: 118 U/L (ref 38–126)
Anion gap: 8 (ref 5–15)
BILIRUBIN TOTAL: 2.6 mg/dL — AB (ref 0.3–1.2)
BUN: 9 mg/dL (ref 6–20)
CHLORIDE: 101 mmol/L (ref 101–111)
CO2: 23 mmol/L (ref 22–32)
Calcium: 7.2 mg/dL — ABNORMAL LOW (ref 8.9–10.3)
Creatinine, Ser: 0.77 mg/dL (ref 0.61–1.24)
GFR calc Af Amer: >60 mL/min (ref >60)
GFR calc non Af Amer: >60 mL/min (ref >60)
Glucose, Bld: 87 mg/dL (ref 65–99)
Potassium: 3.5 mmol/L (ref 3.5–5.1)
Sodium: 132 mmol/L — ABNORMAL LOW (ref 135–145)
Total Protein: 6.1 g/dL — ABNORMAL LOW (ref 6.5–8.1)

## 2014-12-25 LAB — MAGNESIUM: Magnesium: 2 mg/dL (ref 1.7–2.4)

## 2014-12-25 MED ORDER — FENTANYL CITRATE (PF) 100 MCG/2ML IJ SOLN
25.0000 ug | INTRAMUSCULAR | Status: DC | PRN
  Administered 2014-12-25 – 2014-12-26 (×4): 25 ug via INTRAVENOUS
  Filled 2014-12-25 (×4): qty 2

## 2014-12-25 MED ORDER — POTASSIUM CHLORIDE CRYS ER 20 MEQ PO TBCR
40.0000 meq | EXTENDED_RELEASE_TABLET | Freq: Once | ORAL | Status: AC
  Administered 2014-12-25: 40 meq via ORAL
  Filled 2014-12-25: qty 2

## 2014-12-25 MED ORDER — OXYCODONE HCL 5 MG PO TABS
2.5000 mg | ORAL_TABLET | Freq: Four times a day (QID) | ORAL | Status: DC | PRN
  Administered 2014-12-25 – 2014-12-29 (×10): 5 mg via ORAL
  Filled 2014-12-25 (×10): qty 1

## 2014-12-25 NOTE — Progress Notes (Signed)
PROGRESS NOTE    Allen Schroeder VOP:[id number] DOB: [birth year] DOA: 12/21/2014 PCP: [name], MD  Primary Oncologist: Dr. Zola Button Primary GI: Dr. Silvano Rusk Primary Cardiologist: Dr. Sherren Mocha.  HPI/Brief narrative 64 y.o. 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 [name]), was on [facility] which was stopped because patient developed jaundiced although he has had an excellent response to Xtandi at least in regards to monitored PSA values. Of note, last CT scan in 11/2014 showed sclerotic lesions from metastatic disease and new sclerotic lesion in L2 area. Further history includes CAD, atrial fibrillation, alcohol abuse, hypertension, recent hospitalization in 11/2014 for fall at which time he sustained rib fractures.  Patient presented from home to [facility] with worsening mental status changes, confusion and fevers. On admission, blood pressure was 77/44, HR 98, RR 16 - 30, T max 102.9 F and oxygen saturation as low as 89% which has improved with [place] oxygen support. Blood work was notable for leukocytosis of 14.9, hemoglobin 10.8, sodium 127, normal creatinine, INR 1.57. Lactic acid was 2.41 and procalcitonin 0.95. Ammonia level was about 40. Chest x-ray showed low lung volumes with bibasilar atelectasis.   Patient was started on broad-spectrum antibiotics, vanco and zosyn for sepsis although no clear source of infection identified at the time of the admission (now we know his blood cultures are growing gram negative rods). He was admitted to stepdown unit because of hypotension.     Assessment/Plan:  Principal problem: Sepsis secondary to Escherichia coli bacteremia - Sepsis criteria met on the admission with hypotension, tachycardia, tachypnea, fever, hypoxia. Lactic acid was 2.41 and procalcitonin 0.95.  - Source not clear.? GI translocation. Urine culture negative. C. difficile PCR  negative. Not much ascitic fluid and no symptoms to suggest SBP.  - Patient was admitted to SDU and started on broad-spectrum antibiotics, vancomycin and Zosyn. Vancomycin discontinued. - Clinically improved with temperature defervesced - Mild hypotension has improved with brief IV fluids. - Escherichia coli is pansensitive: Changed IV Zosyn to Rocephin and eventually DC home on oral Cipro/Bactrim complete total 2 weeks treatment. Following treatment for bacteremia, as per GI recommendations-keep on Bactrim prophylaxis for SBP.  Active Problem: Acute respiratory failure with hypoxia - Likely secondary to atelectasis in the context of acute hepatic encephalopathy. - Low index of suspicion for pneumonia. - Chest x-ray on the admission did not reveal acute cardiopulmonary findings other than bibasilar atelectasis. - Resolved  History of prostate cancer with bone metastasis - Follows with Dr. [name] on oncology - Last CT abdomen/pelvis in April 2016 with sclerotic lesions consistent with metastasis and there was a new sclerotic lesion in L2 area. - Oncology input 5/19 appreciated: Recommended discontinuing Xtandi  Anemia of chronic disease -Related to bone marrow failure from alcohol abuse and history of malignancy - Follow CBC in a.m. and transfuse for hemoglobin < 7 g per DL. - Drop in hemoglobin from 10.8 > 8.1 may be related to critical illness and hemodilution. No reported bleeding. - stable  Hypokalemia - Replaced  Hyponatremia - Likely related to liver cirrhosis. - Improved/stable  Alcoholic cirrhosis with coagulopathy - Abdominal US on this admission showed questionable minimal ascites. - Limited in terms of giving lasix, spironolactone and sotalol due to hypotension - GI input 5/19 appreciated: Child Pugh score 12 which gives him a class of C and life expectancy of 1-3 years. Believes Xtandi contributed to rising bilirubin. GI states that it is  possible he could improve and may  reconsider decisions regarding cancer treatment.  Paroxysmal Atrial fibrillation - CHADS vasc score at least 3 - Sotalol on hold due to hypotension - Continue Plavix daily  - Not on other anticoagulation due to risk of falls and alcohol abuse   Acute hepatic encephalopathy - Secondary to acute illness complicating alcoholic cirrhosis. Ammonia level is around 40 on this admission. - Continue lactulose 10 g 3 times daily and rifaximin added - Mental status improving but still has mild asterixis. - Minimize opioids and sedative medications - Stool output has decreased.   - rectal tube removed 5/19. - Target for 3-4 BMs daily and may have to adjust lactulose likewise.  Right-sided pleuritic/musculoskeletal chest pain - Related to prior rib fractures. - Chest x-ray 5/16 showed bibasilar atelectasis but could not see previous rib fractures - Pain management: Minimize opioids.  - Added lidocaine patch with better pain control.  Failure to thrive - Secondary to alcoholic cirrhosis and metastatic prostate cancer. - Overall prognosis from liver cirrhosis is poor.  - Patient keen to go on hospice. Family not sure but are agreeable to palliative care consult for goals of care.  - palliative care input/recommendations appreciated .  History of alcohol abuse - As per family, patient has not had alcohol intake since 11/17/14 - No overt withdrawal features.  Nonsustained SVT and? VT - Seen on monitor. Asymptomatic. - Check 2-D echo - TSH recently normal. - Continue monitoring on telemetry -Unable to resume beta blocker secondary to soft blood pressures  - Attempt to keep potassium >4 and magnesium >2   Code Status: Full Family Communication: Discussed extensively with patient's spouse and daughter at bedside on 5/18. None at bedside today. Disposition Plan:  Transfer to telemetry.   Consultants:  CCM-signed off 5/17  Palliative care team  [facility]    Oncology   Procedures:  Rectal tube-DC'd  Condom cath  Antibiotics:  IV cefepime 1 dose on 5/16  IV Zosyn 5/16 > 5/19  IV vancomycin 2 doses on 5/16  Rifaximin 5/17>  IV Rocephin 5/19 >  Subjective: ? Right-sided rib cage pain/possiblt LBP controlled. As per nursing, persists with mild confusion.  Objective: Filed Vitals:   12/25/14 0400 12/25/14 0611 12/25/14 0800 12/25/14 1200  BP: 89/48 99/63 92/53  94/50  Pulse: 73 77 73 79  Temp: 99.9 F (37.7 C)  98.3 F (36.8 C) 98.2 F (36.8 C)  TempSrc: Oral  Oral Oral  Resp: 16 20 19 15   Height:      Weight:      SpO2: 96% 100% 100% 93%    Intake/Output Summary (Last 24 hours) at 12/25/14 1703 Last data filed at 12/25/14 1431  Gross per 24 hour  Intake    150 ml  Output   1225 ml  Net  -1075 ml   Filed Weights   12/21/14 2300 12/23/14 0500 12/24/14 0406  Weight: 92.2 kg (203 lb 4.2 oz) 95 kg (209 lb 7 oz) 96 kg (211 lb 10.3 oz)     Exam:  General exam: Moderately built and nourished middle-aged chronically ill-looking male sitting up comfortably in bed without distress eating breakfast this morning Respiratory system: Reduced breath sounds in the bases but otherwise clear to auscultation. No increased work of breathing. Cardiovascular system: S1 & S2 heard, RRR. No JVD, murmurs, gallops, clicks. Trace pedal edema. Telemetry: Sinus rhythm. Single episode of brief nonsustained SVT and? VT versus A. fib with aberrancy   Gastrointestinal system: Abdomen is  mildly distended, soft and nontender. Normal bowel sounds heard. Central nervous system: Alert and oriented 3 . No focal neurological deficits. asterixis +  Extremities: Symmetric 5 x 5 power.   Data Reviewed: Basic Metabolic Panel:  Recent Labs Lab 12/22/14 0415 12/22/14 1617 12/23/14 0312 12/24/14 0510 12/25/14 0325  NA 133* 134* 135 132* 132*  K 3.4* 3.8 3.7 3.7 3.5  CL 107 107 105 103 101  CO2 17* 21* 24 24 23   GLUCOSE 93 120* 92 81 87   BUN 14 12 11 11 9   CREATININE 0.89 1.05 0.97 0.83 0.77  CALCIUM 6.8* 7.1* 7.2* 7.4* 7.2*   Liver Function Tests:  Recent Labs Lab 12/21/14 1706 12/21/14 2227 12/22/14 0415 12/24/14 0510 12/25/14 0325  AST 104* 83* 77* 74* 81*  ALT 41 32 33 34 32  ALKPHOS 150* 117 107 105 118  BILITOT 5.3* 4.8* 4.9* 3.2* 2.6*  PROT 8.0 6.4* 6.3* 5.9* 6.1*  ALBUMIN 2.7* 2.3* 2.1* 1.9* 2.0*   No results for input(s): LIPASE, AMYLASE in the last 168 hours.  Recent Labs Lab 12/21/14 1705 12/24/14 0510  AMMONIA 41* 33   CBC:  Recent Labs Lab 12/21/14 2227 12/22/14 0415 12/23/14 1700 12/24/14 0510 12/25/14 0325  WBC 14.1* 15.0* 7.8 7.0 8.1  NEUTROABS 12.3*  --   --   --   --   HGB 9.2* 8.1* 9.3* 8.9* 8.8*  HCT 27.6* 24.7* 27.6* 26.4* 25.1*  MCV 122.1* 121.1* 120.5* 120.0* 118.4*  PLT 202 164 153 140* 139*   Cardiac Enzymes: No results for input(s): CKTOTAL, CKMB, CKMBINDEX, TROPONINI in the last 168 hours. BNP (last 3 results) No results for input(s): PROBNP in the last 8760 hours. CBG:  Recent Labs Lab 12/22/14 0737 12/23/14 0745  GLUCAP 96 89    Recent Results (from the past 240 hour(s))  Blood culture (routine x 2)     Status: None   Collection Time: 12/21/14  4:48 PM  Result Value Ref Range Status   Specimen Description BLOOD RAC  Final   Special Requests BOTTLES DRAWN AEROBIC AND ANAEROBIC 5CC  Final   Culture   Final    ESCHERICHIA COLI Note: SUSCEPTIBILITIES PERFORMED ON PREVIOUS CULTURE WITHIN THE LAST 5 DAYS. Note: Gram Stain Report Called to,Read Back By and Verified With: TAMMY PENNINGTON 12/22/14 @ 0950 BY PARDA Performed at [facility]    Report Status 12/24/2014 FINAL  Final  Blood culture (routine x 2)     Status: None   Collection Time: 12/21/14  4:48 PM  Result Value Ref Range Status   Specimen Description BLOOD LAC  Final   Special Requests BOTTLES DRAWN AEROBIC AND ANAEROBIC 5CC  Final   Culture   Final    ESCHERICHIA COLI Note: Gram  Stain Report Called to,Read Back By and Verified With: TAMMY PENNINGTON [id number] ON [id number] BY [place] Performed at [facility]    Report Status 12/24/2014 FINAL  Final   Organism ID, Bacteria ESCHERICHIA COLI  Final      Susceptibility   Escherichia coli - MIC*    AMPICILLIN <=2 SENSITIVE Sensitive     AMPICILLIN/SULBACTAM <=2 SENSITIVE Sensitive     CEFAZOLIN <=4 SENSITIVE Sensitive     CEFEPIME <=1 SENSITIVE Sensitive     CEFTAZIDIME <=1 SENSITIVE Sensitive     CEFTRIAXONE <=1 SENSITIVE Sensitive     CIPROFLOXACIN <=0.25 SENSITIVE Sensitive     GENTAMICIN <=1 SENSITIVE Sensitive     IMIPENEM <=0.25 SENSITIVE Sensitive  PIP/TAZO <=4 SENSITIVE Sensitive     TOBRAMYCIN <=1 SENSITIVE Sensitive     TRIMETH/SULFA <=20 SENSITIVE Sensitive     * ESCHERICHIA COLI  Urine culture     Status: None   Collection Time: 12/21/14  6:16 PM  Result Value Ref Range Status   Specimen Description URINE, CATHETERIZED  Final   Special Requests Immunocompromised  Final   Colony Count NO GROWTH Performed at [facility]   Final   Culture NO GROWTH Performed at [facility]   Final   Report Status 12/22/2014 FINAL  Final  Culture, blood (x 2)     Status: None (Preliminary result)   Collection Time: 12/21/14 10:08 PM  Result Value Ref Range Status   Specimen Description BLOOD RHAND  Final   Special Requests BOTTLES DRAWN AEROBIC AND ANAEROBIC 5CC  Final   Culture   Final           BLOOD CULTURE RECEIVED NO GROWTH TO DATE CULTURE WILL BE HELD FOR 5 DAYS BEFORE ISSUING A FINAL NEGATIVE REPORT Performed at [facility]    Report Status PENDING  Incomplete  Culture, blood (x 2)     Status: None (Preliminary result)   Collection Time: 12/21/14 10:27 PM  Result Value Ref Range Status   Specimen Description BLOOD LHAND  Final   Special Requests BOTTLES DRAWN AEROBIC ONLY 1CC  Final   Culture   Final           BLOOD CULTURE RECEIVED NO GROWTH TO DATE CULTURE WILL BE HELD  FOR 5 DAYS BEFORE ISSUING A FINAL NEGATIVE REPORT Note: Culture results may be compromised due to an inadequate volume of blood received in culture bottles. Performed at [facility]    Report Status PENDING  Incomplete  MRSA PCR Screening     Status: None   Collection Time: 12/21/14 11:08 PM  Result Value Ref Range Status   MRSA by PCR NEGATIVE NEGATIVE Final    Comment:        The GeneXpert MRSA Assay (FDA approved for NASAL specimens only), is one component of a comprehensive MRSA colonization surveillance program. It is not intended to diagnose MRSA infection nor to guide or monitor treatment for MRSA infections.   Clostridium Difficile by PCR     Status: None   Collection Time: 12/21/14 11:58 PM  Result Value Ref Range Status   C difficile by pcr NEGATIVE NEGATIVE Final           Studies: No results found.      Scheduled Meds: . cefTRIAXone (ROCEPHIN)  IV  2 g Intravenous Q24H  . clopidogrel  75 mg Oral Daily  . folic acid  1 mg Oral Daily  . lactulose  10 g Oral TID  . lidocaine  1 patch Transdermal Q24H  . multivitamin with minerals  1 tablet Oral Daily  . rifaximin  550 mg Oral BID  . sodium chloride  3 mL Intravenous Q12H  . thiamine  100 mg Oral Daily   Continuous Infusions: . sodium chloride      Active Problems:   Essential hypertension   Atrial fibrillation   Ascites   Altered mental status   Cirrhosis with alcoholism   Hyponatremia   Altered mental state   Acute encephalopathy   Sepsis   Bacteremia, escherichia coli   Encephalopathy, hepatic    Time spent: 63 minutes    Tavin Vernet, MD, FACP, FHM. [facility] Pager [phone]  If 7PM-7AM, please  contact night-coverage [email or web address] Password [email or web address] 12/25/2014, 5:03 PM    LOS: 4 days

## 2014-12-25 NOTE — Progress Notes (Signed)
  Echocardiogram 2D Echocardiogram has been performed.  Darcee Dekker FRANCES 12/25/2014, 3:15 PM

## 2014-12-25 NOTE — Progress Notes (Signed)
Received  from SDU, alert and orientedx3, agree with previous RN's assessment.

## 2014-12-25 NOTE — Consult Note (Addendum)
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 He was admitted with Sepsis related to E. Coli Bacteremia and hepatic encephalopathy. Patient also had an admission for alcoholic transaminitis two months ago and went to short term physical rehab at [facility] where he had multiple falls and issues with delirium. Wife reports that he has declined since the fall and she discusses her perception of the many medical failures with him.   Allen Schroeder is clearly suffering with pain in his back and ribs from 10-12 displaced rib fractures. Allen Schroeder requested hospice- he tells me "I am tired" "Im not suicidal-but I cant take this anymore and I want to die".  He has a high level of not only physical pain but there is clear existential emotional suffering going on. His wife is extremely anxious and has not been allowing pain medications to be administered- she speaks for him often and they do not agree. She tells me that he needs to be mentally clear for all of his visitors and that he is a social person and would not want to be sedated. She also says "with his history of addiction you have to be careful". I attempted to re-frame the primary focus on Allen Schroeder and his suffering. I told them this was serious and he may be approaching EOL. I also shared with her that I did not think his pain was being adequately managed and explained the specialty role of palliative and  symptom management.  Mrs. Trippe was hesitant to allow me to be involved in his care-she requested that I ask Dr. Abner Greenspan and Dr. Carlean Purl for permission to change his pain medication and gave me instructions not to make any changes unless this was done and reminded me that NSAIDS were bad for people with Cirrhosis and that he could not take those. Allen Schroeder himself was grateful for my involvement and clearly wanted to have an EOL conversation and gave me complete permission to get him comfortable.  Given his pain level- both physical and emotional I made a decision to first try to get him more comfortable before I discussed Hospice or Code status and clearly his wife did not have buy-in with the palliative approach.  This case will probably take time and patience - he would meet hospice referral criteria based on his liver disease and sepsis. I suspect there are very complicated psycho-social issues going and complicating psychology around families who are afflicted by addiction.    Palliative Care Discussion Held Today Patient, his wife and daughter were present.  Code Status/Advance Care Planning:  Conversation deferred-Unable to discuss during today's goals of care due to family anxiety and uncontrolled pain   Symptom Management:   For now I have written for fentanyl IV boluses PRN for pain- I will continue to work with his wife on an acceptable plan and if she remains resistant will discuss alone with patient and assess for capacity.  Psycho-social/Spiritual:   Support System: Family  Desire for further Chaplaincy  support:yes  Prognosis: TBD based on interventions  Discharge Planning:  TBD       Chief Complaint/History of Present Illness: Liver failure, Sepsis  Primary Diagnoses  Present on Admission:  . Essential hypertension . Ascites . Atrial fibrillation . Altered mental state  Palliative Review of Systems:  I have reviewed the medical record, interviewed the  patient and family, and examined the patient. The following aspects are pertinent.  Past Medical History  Diagnosis Date  . Atrial fibrillation   . Other and unspecified hyperlipidemia   . Unspecified essential hypertension   . Malignant neoplasm of prostate 11/2007    Mets to Bone  . Elevated prostate specific antigen (PSA)   . Personal history of colonic polyps   . Hypocalcemia   . Acute alcoholic hepatitis   . Essential and other specified forms of tremor   . Anxiety state, unspecified   . Family history of malignant neoplasm of gastrointestinal tract   . Alcohol abuse with physiological dependence 02/13/2012  . 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 Alcoholic hepatitis with ascites 11/13/2014   History   Social History  . Marital Status: Married    Spouse Name: N/A  . Number of Children: 3  . Years of Education: N/A   Occupational History  . lawyer     VP of [employer] History Main Topics  . Smoking status: Former Smoker    Types: Cigarettes  . Smokeless tobacco: Never Used     Comment: stopped in college  . Alcohol Use: Yes     Comment: has consumed as much as a 1/2 gallon of vodka in a day, now drinks at least 3-5 drinks per day.   . Drug Use: No  . Sexual Activity: No   Other Topics Concern  . None   Social History Narrative   Patient is married. 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   11/18/2014      Family History  Problem Relation Age of Onset  . Prostate cancer Father   . Coronary artery disease Father   . Colon cancer Father   . Aneurysm Mother    Scheduled Meds: . cefTRIAXone (ROCEPHIN)  IV  2 g Intravenous Q24H  . clopidogrel  75 mg Oral Daily  . folic acid  1 mg Oral Daily  . lactulose  10 g Oral TID  . lidocaine  1 patch Transdermal Q24H  . multivitamin with minerals  1 tablet Oral Daily  .  rifaximin  550 mg Oral BID  . sodium chloride  3 mL Intravenous Q12H  . thiamine  100 mg Oral Daily   Continuous Infusions: . sodium chloride     PRN Meds:.fentaNYL (SUBLIMAZE) injection, ondansetron **OR** ondansetron (ZOFRAN) IV, oxyCODONE Medications Prior to Admission:  Prior to Admission medications   Medication Sig Start Date End Date Taking? Authorizing Provider  CALCIUM CARB-CHOLECALCIFEROL PO Take 1 capsule by mouth 2 (two) times daily.    Yes Historical Provider, MD  clopidogrel (PLAVIX) 75 MG tablet TAKE ONE TABLET BY MOUTH ONCE DAILY Patient taking differently: Take 75 mg by mouth daily.  06/22/14  Yes Sherren Mocha, MD  folic acid (FOLVITE) 1 MG tablet Take one tablet by mouth once a day.... Patient taking differently: Take 1 mg by mouth daily.  06/22/14  Yes Sherren Mocha, MD  furosemide (LASIX) 20  MG tablet Take 0.5 tablets (10 mg total) by mouth daily. 12/06/14  Yes Nita Sells, MD  lactulose (CHRONULAC) 10 GM/15ML solution Take 15 mLs (10 g total) by mouth 2 (two) times daily. Patient taking differently: Take 10 g by mouth 3 (three) times daily.  12/06/14  Yes Nita Sells, MD  Multiple Vitamin (MULTIVITAMIN) tablet Take 1 tablet by mouth daily.     Yes Historical Provider, MD  oxyCODONE (OXY IR/ROXICODONE) 5 MG immediate release tablet Take 1 tablet (5 mg total) by mouth every 4 (four) hours as needed for moderate pain. Patient taking differently: Take 2.5-5 mg by mouth 3 (three) times daily as needed for severe pain (pain).  12/06/14  Yes Nita Sells, MD  potassium chloride SA (K-DUR,KLOR-CON) 20 MEQ tablet Take 1 tablet (20 mEq total) by mouth 2 (two) times daily. 11/13/14  Yes Gatha Mayer, MD  sotalol (BETAPACE) 120 MG tablet Take 120 mg by mouth daily.  11/10/14  Yes Historical Provider, MD  spironolactone (ALDACTONE) 25 MG tablet Take 1 tablet (25 mg total) by mouth daily. 12/06/14  Yes Nita Sells, MD  chlordiazePOXIDE (LIBRIUM) 25 MG capsule  Take 1 capsule (25 mg total) by mouth 3 (three) times daily. Patient not taking: Reported on 12/21/2014 12/06/14   Nita Sells, MD  sotalol (BETAPACE) 80 MG tablet Take 1 tablet (80 mg total) by mouth daily. T Patient not taking: Reported on 12/21/2014 11/24/14   Theodis Blaze, MD  UNKNOWN TO PATIENT Slow release hormone implant placed 8/13 by Dr. Willette Cluster.  Good for one year    Historical Provider, MD   No Known Allergies CBC:    Component Value Date/Time   WBC 8.1 12/25/2014 0325   WBC 11.7* 11/10/2014 1532   WBC 6.2 10/24/2013 1527   HGB 8.8* 12/25/2014 0325   HGB 11.7* 11/10/2014 1532   HGB 12.7* 10/24/2013 1527   HCT 25.1* 12/25/2014 0325   HCT 34.1* 11/10/2014 1532   HCT 40.2* 10/24/2013 1527   PLT 139* 12/25/2014 0325   PLT 113* 11/10/2014 1532   MCV 118.4* 12/25/2014 0325   MCV 116.8* 11/10/2014 1532   MCV 116.2* 10/24/2013 1527   NEUTROABS 12.3* 12/21/2014 2227   NEUTROABS 8.8* 11/10/2014 1532   LYMPHSABS 1.0 12/21/2014 2227   LYMPHSABS 1.6 11/10/2014 1532   MONOABS 0.8 12/21/2014 2227   MONOABS 0.8 11/10/2014 1532   EOSABS 0.0 12/21/2014 2227   EOSABS 0.4 11/10/2014 1532   BASOSABS 0.0 12/21/2014 2227   BASOSABS 0.1 11/10/2014 1532   Comprehensive Metabolic Panel:    Component Value Date/Time   NA 132* 12/25/2014 0325   NA 131* 11/10/2014 1532   K 3.5 12/25/2014 0325   K 3.3* 11/10/2014 1532   CL 101 12/25/2014 0325   CL 100 12/10/2012 1540   CO2 23 12/25/2014 0325   CO2 23 11/10/2014 1532   BUN 9 12/25/2014 0325   BUN 5.6* 11/10/2014 1532   CREATININE 0.77 12/25/2014 0325   CREATININE 0.8 11/10/2014 1532   GLUCOSE 87 12/25/2014 0325   GLUCOSE 90 11/10/2014 1532   GLUCOSE 109* 12/10/2012 1540   CALCIUM 7.2* 12/25/2014 0325   CALCIUM 7.5* 11/10/2014 1532   AST 81* 12/25/2014 0325   AST 145* 11/10/2014 1532   ALT 32 12/25/2014 0325   ALT 35 11/10/2014 1532   ALKPHOS 118 12/25/2014 0325   ALKPHOS 231* 11/10/2014 1532   BILITOT 2.6*  12/25/2014 0325   BILITOT 7.02* 11/10/2014 1532   PROT 6.1* 12/25/2014 0325  PROT 7.8 11/10/2014 1532   ALBUMIN 2.0* 12/25/2014 0325   ALBUMIN 2.4* 11/10/2014 1532    Physical Exam: Vital Signs: BP 92/53 mmHg  Pulse 73  Temp(Src) 99.9 F (37.7 C) (Oral)  Resp 19  Ht 6\' 2"  (1.88 m)  Wt 96 kg (211 lb 10.3 oz)  BMI 27.16 kg/m2  SpO2 100% SpO2: SpO2: 100 % O2 Device: O2 Device: Not Delivered O2 Flow Rate: O2 Flow Rate (L/min): 1 L/min Intake/output summary:  Intake/Output Summary (Last 24 hours) at 12/25/14 0815 Last data filed at 12/25/14 2174  Gross per 24 hour  Intake    150 ml  Output    800 ml  Net   -650 ml   LBM:   Baseline Weight: Weight: 92.08 kg (203 lb) Most recent weight: Weight: 96 kg (211 lb 10.3 oz)  Exam Findings:  50          Palliative Performance Scale: 50              Additional Data Reviewed: Recent Labs     12/24/14  0510  12/25/14  0325  WBC  7.0  8.1  HGB  8.9*  8.8*  PLT  140*  139*  NA  132*  132*  BUN  11  9  CREATININE  0.83  0.77     Time In: 10AM  Time Out: 11AM Time Total: 60 min  Greater than 50%  of this time was spent counseling and coordinating care related to the above assessment and plan.  Signed by: Roma Schanz, DO  12/25/2014, 8:15 AM  Please contact Palliative Medicine Team phone at [phone] for questions and concerns.

## 2014-12-26 DIAGNOSIS — M545 Low back pain, unspecified: Secondary | ICD-10-CM | POA: Insufficient documentation

## 2014-12-26 DIAGNOSIS — E876 Hypokalemia: Secondary | ICD-10-CM | POA: Insufficient documentation

## 2014-12-26 DIAGNOSIS — Z515 Encounter for palliative care: Secondary | ICD-10-CM

## 2014-12-26 LAB — COMPREHENSIVE METABOLIC PANEL
ALBUMIN: 2 g/dL — AB (ref 3.5–5.0)
ALK PHOS: 127 U/L — AB (ref 38–126)
ALT: 34 U/L (ref 17–63)
ANION GAP: 6 (ref 5–15)
AST: 83 U/L — ABNORMAL HIGH (ref 15–41)
BILIRUBIN TOTAL: 2.7 mg/dL — AB (ref 0.3–1.2)
BUN: 7 mg/dL (ref 6–20)
CHLORIDE: 101 mmol/L (ref 101–111)
CO2: 26 mmol/L (ref 22–32)
CREATININE: 0.65 mg/dL (ref 0.61–1.24)
Calcium: 7.6 mg/dL — ABNORMAL LOW (ref 8.9–10.3)
GFR calc Af Amer: >60 mL/min (ref >60)
Glucose, Bld: 84 mg/dL (ref 65–99)
Potassium: 3.4 mmol/L — ABNORMAL LOW (ref 3.5–5.1)
Sodium: 133 mmol/L — ABNORMAL LOW (ref 135–145)
Total Protein: 6.1 g/dL — ABNORMAL LOW (ref 6.5–8.1)

## 2014-12-26 LAB — CBC
HEMATOCRIT: 27.9 % — AB (ref 39.0–52.0)
HEMOGLOBIN: 9.1 g/dL — AB (ref 13.0–17.0)
MCH: 38.6 pg — AB (ref 26.0–34.0)
MCHC: 32.6 g/dL (ref 30.0–36.0)
MCV: 118.2 fL — AB (ref 78.0–100.0)
Platelets: 150 10*3/uL (ref 150–400)
RBC: 2.36 MIL/uL — AB (ref 4.22–5.81)
RDW: 15.1 % (ref 11.5–15.5)
WBC: 7.8 10*3/uL (ref 4.0–10.5)

## 2014-12-26 MED ORDER — FENTANYL CITRATE (PF) 100 MCG/2ML IJ SOLN
25.0000 ug | INTRAMUSCULAR | Status: DC | PRN
  Administered 2014-12-27: 25 ug via INTRAVENOUS
  Filled 2014-12-26 (×2): qty 2

## 2014-12-26 MED ORDER — POTASSIUM CHLORIDE CRYS ER 20 MEQ PO TBCR
40.0000 meq | EXTENDED_RELEASE_TABLET | Freq: Once | ORAL | Status: AC
  Administered 2014-12-26: 40 meq via ORAL
  Filled 2014-12-26: qty 2

## 2014-12-26 MED ORDER — LACTULOSE 10 GM/15ML PO SOLN
20.0000 g | Freq: Three times a day (TID) | ORAL | Status: DC
  Administered 2014-12-26 – 2014-12-27 (×3): 20 g via ORAL
  Filled 2014-12-26 (×3): qty 30

## 2014-12-26 MED ORDER — FENTANYL 25 MCG/HR TD PT72
25.0000 ug | MEDICATED_PATCH | TRANSDERMAL | Status: DC
Start: 2014-12-26 — End: 2014-12-29
  Administered 2014-12-26 – 2014-12-29 (×2): 25 ug via TRANSDERMAL
  Filled 2014-12-26 (×2): qty 1

## 2014-12-26 MED ORDER — CIPROFLOXACIN HCL 500 MG PO TABS
500.0000 mg | ORAL_TABLET | Freq: Two times a day (BID) | ORAL | Status: DC
  Administered 2014-12-26 – 2014-12-29 (×7): 500 mg via ORAL
  Filled 2014-12-26 (×7): qty 1

## 2014-12-26 NOTE — Progress Notes (Signed)
Allen Schroeder is doing better. I reviewed his use of pain meds in the last 24 hours. I recommend placing a 38mcg Duragesic Patch and supplementing oxycodone for breakthrough pain based on his usage of IV Fentanyl over the last 24 hours. Will continue discussion about goals of care once his pain is under better control.  Lane Hacker, DO Palliative Medicine

## 2014-12-26 NOTE — Clinical Social Work Placement (Signed)
   CLINICAL SOCIAL WORK PLACEMENT  NOTE  Date:  12/26/2014  Patient Details  Name: Allen Schroeder MRN: [id number] Date of Birth: Mar 13, 1951  Clinical Social Work is seeking post-discharge placement for this patient at the   level of care (*CSW will initial, date and re-position this form in  chart as items are completed):  Yes   Patient/family provided with [facility] Work Department's list of facilities offering this level of care within the geographic area requested by the patient (or if unable, by the patient's family).  Yes   Patient/family informed of their freedom to choose among providers that offer the needed level of care, that participate in Medicare, Medicaid or managed care program needed by the patient, have an available bed and are willing to accept the patient.  Yes   Patient/family informed of [facility] ownership interest in [facility] and [facility], as well as of the fact that they are under no obligation to receive care at these facilities.  PASRR submitted to EDS on       PASRR number received on       Existing PASRR number confirmed on 12/26/14     FL2 transmitted to all facilities in geographic area requested by pt/family on 12/26/14     FL2 transmitted to all facilities within larger geographic area on       Patient informed that his/her managed care company has contracts with or will negotiate with certain facilities, including the following:            Patient/family informed of bed offers received.  Patient chooses bed at       Physician recommends and patient chooses bed at      Patient to be transferred to   on  .  Patient to be transferred to facility by       Patient family notified on   of transfer.  Name of family member notified:        PHYSICIAN       Additional Comment:    _______________________________________________ Edson Snowball, LCSW 12/26/2014, 9:05 PM

## 2014-12-26 NOTE — Progress Notes (Addendum)
PROGRESS NOTE    Allen Schroeder OVF:[id number] DOB: 18-Dec-1950 DOA: 12/21/2014 PCP: Allen Ly, MD  Primary Oncologist: Dr. Zola Schroeder Primary GI: Dr. Silvano Schroeder Primary Cardiologist: Dr. Sherren Schroeder.  HPI/Brief narrative 64 y.o. 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 Alen Schroeder), was on [facility] which was stopped because patient developed jaundiced although he has had an excellent response to Xtandi at least in regards to monitored PSA values. Of note, last CT scan in 11/2014 showed sclerotic lesions from metastatic disease and new sclerotic lesion in L2 area. Further history includes CAD, atrial fibrillation, alcohol abuse, hypertension, recent hospitalization in 11/2014 for fall at which time he sustained rib fractures.  Patient presented from home to [facility] with worsening mental status changes, confusion and fevers. On admission, blood pressure was 77/44, HR 98, RR 16 - 30, T max 102.9 F and oxygen saturation as low as 89% which has improved with [place] oxygen support. Blood work was notable for leukocytosis of 14.9, hemoglobin 10.8, sodium 127, normal creatinine, INR 1.57. Lactic acid was 2.41 and procalcitonin 0.95. Ammonia level was about 40. Chest x-ray showed low lung volumes with bibasilar atelectasis.   Patient was started on broad-spectrum antibiotics, vanco and zosyn for sepsis although no clear source of infection identified at the time of the admission (now we know his blood cultures are growing gram negative rods). He was admitted to stepdown unit because of hypotension.     Assessment/Plan:  Principal problem: Sepsis secondary to Escherichia coli bacteremia - Sepsis criteria met on the admission with hypotension, tachycardia, tachypnea, fever, hypoxia. Lactic acid was 2.41 and procalcitonin 0.95.  - Source not clear. Possibly from GI translocation. Urine culture negative. C.  difficile PCR negative. Not much ascitic fluid and no symptoms to suggest SBP.  - Patient was admitted to SDU and started on broad-spectrum antibiotics, vancomycin and Zosyn. Vancomycin discontinued. - Clinically improved with temperature defervesced - Mild hypotension has improved with brief IV fluids. - Escherichia coli is pansensitive: Changed IV Zosyn to Rocephin and eventually DC home on oral Cipro/Bactrim complete total 2 weeks treatment. Following treatment for bacteremia, as per GI recommendations-keep on Bactrim prophylaxis for SBP. - Will change to PO Cipro 5/21 and complete total 14 days treatment  Active Problem: Acute respiratory failure with hypoxia - Likely secondary to atelectasis in the context of acute hepatic encephalopathy. - Low index of suspicion for pneumonia. - Chest x-ray on the admission did not reveal acute cardiopulmonary findings other than bibasilar atelectasis. - Resolved  History of prostate cancer with bone metastasis - Follows with Dr. Alen Schroeder on oncology - Last CT abdomen/pelvis in April 2016 with sclerotic lesions consistent with metastasis and there was a new sclerotic lesion in L2 area. - Oncology input 5/19 appreciated: Recommended discontinuing Xtandi  Anemia of chronic disease -Related to bone marrow failure from alcohol abuse and history of malignancy - Follow CBC in a.m. and transfuse for hemoglobin < 7 g per DL. - Drop in hemoglobin from 10.8 > 8.1 may be related to critical illness and hemodilution. No reported bleeding. - stable  Hypokalemia - Replaced  Hyponatremia - Likely related to liver cirrhosis. - Improved/stable  Alcoholic cirrhosis with coagulopathy - Abdominal US on this admission showed questionable minimal ascites. - Limited in terms of giving lasix, spironolactone and sotalol due to hypotension - GI input 5/19 appreciated: Child Pugh score 12 which gives him a class of C and life  expectancy of 1-3 years. Believes Xtandi  contributed to rising bilirubin. GI states that it is possible he could improve and may reconsider decisions regarding cancer treatment.  Paroxysmal Atrial fibrillation - CHADS vasc score at least 3 - Sotalol on hold due to hypotension - Continue Plavix daily  - Not on other anticoagulation due to risk of falls and alcohol abuse   Acute hepatic encephalopathy - Secondary to acute illness complicating alcoholic cirrhosis. Ammonia level is around 40 on this admission. - Continue lactulose 10 g 3 times daily and rifaximin added - Mental status improving but still has mild asterixis. - Minimize opioids and sedative medications - Stool output has decreased.   - rectal tube removed 5/19. - We will increase dose of lactulose 5/21due to inadequate BMs (target 3 BMs per day-requested nursing to document)  Right-sided pleuritic/musculoskeletal chest pain - Related to prior rib fractures. - Chest x-ray 5/16 showed bibasilar atelectasis but could not see previous rib fractures - Pain management: Minimize opioids.  - Added lidocaine patch with better pain control. - Palliative care team adjusting pain regimen.  Failure to thrive - Secondary to alcoholic cirrhosis and metastatic prostate cancer. - Overall prognosis from liver cirrhosis is poor.  - Patient keen to go on hospice. Family not sure but are agreeable to palliative care consult for goals of care.  - palliative care input/recommendations appreciated-patient has repeatedly expressed desire for hospice care. However family not in agreement with his wishes .  History of alcohol abuse - As per family, patient has not had alcohol intake since 11/17/14 - No overt withdrawal features.  Nonsustained SVT and? VT - Seen on monitor. Asymptomatic. - Check 2-D echo: Normal EF - TSH recently normal. - Continue monitoring on telemetry -Unable to resume beta blocker secondary to soft blood pressures  - Attempt to keep potassium >4 and magnesium  >2   Code Status: Full Family Communication: Discussed extensively with patient's spouse via phone on 5/21.  Disposition Plan:  Possible DC to SNF in 48 hours   Consultants:  CCM-signed off 5/17  Palliative care team  [facility]   Oncology   Procedures:  Rectal tube-DC'd  Condom cath  Antibiotics:  IV cefepime 1 dose on 5/16  IV Zosyn 5/16 > 5/19  IV vancomycin 2 doses on 5/16  Rifaximin 5/17>  IV Rocephin 5/19 >  Subjective: Mild confusion persists. No BMs documented. Patient's pain report not consistent.  Objective: Filed Vitals:   12/25/14 0800 12/25/14 1200 12/25/14 2136 12/26/14 0530  BP: 92/53 94/50 106/59 101/56  Pulse: 73 79 83 73  Temp: 98.3 F (36.8 C) 98.2 F (36.8 C) 98.3 F (36.8 C) 98 F (36.7 C)  TempSrc: Oral Oral Oral Oral  Resp: 19 15 18 18   Height:      Weight:    91.9 kg (202 lb 9.6 oz)  SpO2: 100% 93% 94% 98%    Intake/Output Summary (Last 24 hours) at 12/26/14 1245 Last data filed at 12/26/14 1108  Gross per 24 hour  Intake    173 ml  Output   1575 ml  Net  -1402 ml   Filed Weights   12/23/14 0500 12/24/14 0406 12/26/14 0530  Weight: 95 kg (209 lb 7 oz) 96 kg (211 lb 10.3 oz) 91.9 kg (202 lb 9.6 oz)     Exam:  General exam: Moderately built and nourished middle-aged chronically ill-looking male sitting up comfortably in bed without distress. Respiratory system: clear to auscultation. No increased work of breathing.  Cardiovascular system: S1 & S2 heard, RRR. No JVD, murmurs, gallops, clicks. Trace pedal edema. Telemetry: Sinus rhythm. No further arrhythmias noted.  Gastrointestinal system: Abdomen is mildly distended, soft and nontender. Normal bowel sounds heard. Central nervous system: Alert and oriented 2 . No focal neurological deficits. asterixis +  Extremities: Symmetric 5 x 5 power.   Data Reviewed: Basic Metabolic Panel:  Recent Labs Lab 12/22/14 1617 12/23/14 0312 12/24/14 0510 12/25/14 0325  12/25/14 1815 12/26/14 0527  NA 134* 135 132* 132*  --  133*  K 3.8 3.7 3.7 3.5  --  3.4*  CL 107 105 103 101  --  101  CO2 21* 24 24 23   --  26  GLUCOSE 120* 92 81 87  --  84  BUN 12 11 11 9   --  7  CREATININE 1.05 0.97 0.83 0.77  --  0.65  CALCIUM 7.1* 7.2* 7.4* 7.2*  --  7.6*  MG  --   --   --   --  2.0  --    Liver Function Tests:  Recent Labs Lab 12/21/14 2227 12/22/14 0415 12/24/14 0510 12/25/14 0325 12/26/14 0527  AST 83* 77* 74* 81* 83*  ALT 32 33 34 32 34  ALKPHOS 117 107 105 118 127*  BILITOT 4.8* 4.9* 3.2* 2.6* 2.7*  PROT 6.4* 6.3* 5.9* 6.1* 6.1*  ALBUMIN 2.3* 2.1* 1.9* 2.0* 2.0*   No results for input(s): LIPASE, AMYLASE in the last 168 hours.  Recent Labs Lab 12/21/14 1705 12/24/14 0510  AMMONIA 41* 33   CBC:  Recent Labs Lab 12/21/14 2227 12/22/14 0415 12/23/14 1700 12/24/14 0510 12/25/14 0325 12/26/14 0527  WBC 14.1* 15.0* 7.8 7.0 8.1 7.8  NEUTROABS 12.3*  --   --   --   --   --   HGB 9.2* 8.1* 9.3* 8.9* 8.8* 9.1*  HCT 27.6* 24.7* 27.6* 26.4* 25.1* 27.9*  MCV 122.1* 121.1* 120.5* 120.0* 118.4* 118.2*  PLT 202 164 153 140* 139* 150   Cardiac Enzymes: No results for input(s): CKTOTAL, CKMB, CKMBINDEX, TROPONINI in the last 168 hours. BNP (last 3 results) No results for input(s): PROBNP in the last 8760 hours. CBG:  Recent Labs Lab 12/22/14 0737 12/23/14 0745  GLUCAP 96 89    Recent Results (from the past 240 hour(s))  Blood culture (routine x 2)     Status: None   Collection Time: 12/21/14  4:48 PM  Result Value Ref Range Status   Specimen Description BLOOD RAC  Final   Special Requests BOTTLES DRAWN AEROBIC AND ANAEROBIC 5CC  Final   Culture   Final    ESCHERICHIA COLI Note: SUSCEPTIBILITIES PERFORMED ON PREVIOUS CULTURE WITHIN THE LAST 5 DAYS. Note: Gram Stain Report Called to,Read Back By and Verified With: TAMMY PENNINGTON 12/22/14 @ 0950 BY PARDA Performed at [facility]    Report Status 12/24/2014 FINAL  Final    Blood culture (routine x 2)     Status: None   Collection Time: 12/21/14  4:48 PM  Result Value Ref Range Status   Specimen Description BLOOD LAC  Final   Special Requests BOTTLES DRAWN AEROBIC AND ANAEROBIC 5CC  Final   Culture   Final    ESCHERICHIA COLI Note: Gram Stain Report Called to,Read Back By and Verified With: TAMMY PENNINGTON [id number] ON [id number] BY [facility] Performed at [facility]    Report Status 12/24/2014 FINAL  Final   Organism ID, Bacteria ESCHERICHIA COLI  Final  Susceptibility   Escherichia coli - MIC*    AMPICILLIN <=2 SENSITIVE Sensitive     AMPICILLIN/SULBACTAM <=2 SENSITIVE Sensitive     CEFAZOLIN <=4 SENSITIVE Sensitive     CEFEPIME <=1 SENSITIVE Sensitive     CEFTAZIDIME <=1 SENSITIVE Sensitive     CEFTRIAXONE <=1 SENSITIVE Sensitive     CIPROFLOXACIN <=0.25 SENSITIVE Sensitive     GENTAMICIN <=1 SENSITIVE Sensitive     IMIPENEM <=0.25 SENSITIVE Sensitive     PIP/TAZO <=4 SENSITIVE Sensitive     TOBRAMYCIN <=1 SENSITIVE Sensitive     TRIMETH/SULFA <=20 SENSITIVE Sensitive     * ESCHERICHIA COLI  Urine culture     Status: None   Collection Time: 12/21/14  6:16 PM  Result Value Ref Range Status   Specimen Description URINE, CATHETERIZED  Final   Special Requests Immunocompromised  Final   Colony Count NO GROWTH Performed at [facility]   Final   Culture NO GROWTH Performed at [facility]   Final   Report Status 12/22/2014 FINAL  Final  Culture, blood (x 2)     Status: None (Preliminary result)   Collection Time: 12/21/14 10:08 PM  Result Value Ref Range Status   Specimen Description BLOOD RHAND  Final   Special Requests BOTTLES DRAWN AEROBIC AND ANAEROBIC 5CC  Final   Culture   Final           BLOOD CULTURE RECEIVED NO GROWTH TO DATE CULTURE WILL BE HELD FOR 5 DAYS BEFORE ISSUING A FINAL NEGATIVE REPORT Performed at [facility]    Report Status PENDING  Incomplete  Culture, blood (x 2)     Status: None  (Preliminary result)   Collection Time: 12/21/14 10:27 PM  Result Value Ref Range Status   Specimen Description BLOOD LHAND  Final   Special Requests BOTTLES DRAWN AEROBIC ONLY 1CC  Final   Culture   Final           BLOOD CULTURE RECEIVED NO GROWTH TO DATE CULTURE WILL BE HELD FOR 5 DAYS BEFORE ISSUING A FINAL NEGATIVE REPORT Note: Culture results may be compromised due to an inadequate volume of blood received in culture bottles. Performed at [facility]    Report Status PENDING  Incomplete  MRSA PCR Screening     Status: None   Collection Time: 12/21/14 11:08 PM  Result Value Ref Range Status   MRSA by PCR NEGATIVE NEGATIVE Final    Comment:        The GeneXpert MRSA Assay (FDA approved for NASAL specimens only), is one component of a comprehensive MRSA colonization surveillance program. It is not intended to diagnose MRSA infection nor to guide or monitor treatment for MRSA infections.   Clostridium Difficile by PCR     Status: None   Collection Time: 12/21/14 11:58 PM  Result Value Ref Range Status   C difficile by pcr NEGATIVE NEGATIVE Final           Studies: No results found.      Scheduled Meds: . cefTRIAXone (ROCEPHIN)  IV  2 g Intravenous Q24H  . clopidogrel  75 mg Oral Daily  . fentaNYL  25 mcg Transdermal Q72H  . folic acid  1 mg Oral Daily  . lactulose  10 g Oral TID  . lidocaine  1 patch Transdermal Q24H  . multivitamin with minerals  1 tablet Oral Daily  . rifaximin  550 mg Oral BID  . sodium chloride  3 mL Intravenous  Q12H  . thiamine  100 mg Oral Daily   Continuous Infusions: . sodium chloride      Active Problems:   Essential hypertension   Atrial fibrillation   Ascites   Altered mental status   Cirrhosis with alcoholism   Hyponatremia   Altered mental state   Acute encephalopathy   Sepsis   Bacteremia, escherichia coli   Encephalopathy, hepatic   Palliative care encounter   Right-sided low back pain without  sciatica    Time spent: 79 minutes    HONGALGI,ANAND, MD, FACP, FHM. [facility] Pager [phone]  If 7PM-7AM, please contact night-coverage [email or web address] Password [email or web address] 12/26/2014, 12:45 PM    LOS: 5 days

## 2014-12-26 NOTE — Clinical Social Work Note (Signed)
Clinical Social Work Assessment  Patient Details  Name: Allen Schroeder MRN: [id number] Date of Birth: 1951-01-29  Date of referral:  12/26/14               Reason for consult:  Facility Placement                Permission sought to share information with:  Facility Sport and exercise psychologist, Family Supports Permission granted to share information::  Yes, Verbal Permission Granted  Name::     Allen Schroeder  Agency::  [facility]  Relationship::  adult daughter  Sport and exercise psychologist Information:     Housing/Transportation Living arrangements for the past 2 months:  [place] of Information:  Patient, Adult Children Patient Interpreter Needed:  None Criminal Activity/Legal Involvement Pertinent to Current Situation/Hospitalization:  No - Comment as needed Significant Relationships:  Adult Children Lives with:  Self Do you feel safe going back to the place where you live?    Need for family participation in patient care:  Yes (Comment)  Care giving concerns:  Concerns regarding patient returning home with 24 hour care vs. Skilled nursing placement   Social Worker assessment / plan:  CSW met with pt and pt daughter to complete psychosocial assessment. Pt deferred assessment to pt daughter. Pt daughter appears very concerned regarding pt disposition and anticipated discharge date of Monday. Patient daughter does not want patient to go to skilled nursing but is open to the option due to the doctor and physical therapy recommendations and pt severe weakness. Pt daughter shared a poor experience at a previous skilled nursing facility which has left pt daughter feeling very upset. CSW provided supportive counseling. CSW and pt daughter discussed other facility options. Patient daughter open to [place] [facility] for a short term rehab stay. Pt daughter asking for follow up tomorrow between 1 and 2pm.    Employment status:  Retired Forensic scientist:  Medicare PT Recommendations:   [place], [place] / Referral to community resources:     Patient/Family's Response to care:  Pt and pt daughter accepting of plan for snf.   Patient/Family's Understanding of and Emotional Response to Diagnosis, Current Treatment, and Prognosis:  Pt family verbalized understanding of patient current needs.  Emotional Assessment Appearance:  Appears stated age Attitude/Demeanor/Rapport:   (calm and resting) Affect (typically observed):  Accepting Orientation:  Oriented to Self, Oriented to Place, Oriented to Situation Alcohol / Substance use:  Not Applicable Psych involvement (Current and /or in the community):  No (Comment)  Discharge Needs  Concerns to be addressed:  Discharge Planning Concerns Readmission within the last 30 days:  No Current discharge risk:  None Barriers to Discharge:      Nygel Prokop A, LCSW 12/26/2014, 9:02 PM

## 2014-12-26 NOTE — Progress Notes (Signed)
Daily Progress Note   Patient Name: Allen Schroeder       Date: 12/26/2014 DOB: [birth year]  Age: 64 y.o. MRN#: [id number] Attending Physician: Modena Jansky, MD Primary Care Physician: [name], MD Admit Date: 12/21/2014  Reason for Consultation/Follow-up: Establishing goals of care and Pain control  Subjective: Pt is alert, slightly confused . States his pain is an 8/10 to his back. Chart reviewed . Multiple prn's needed to manage pain: fentanyl IV TTD 17mcg and oxycodone po TTD 15.  Interval Events: none  Length of Stay: 5 days  Current Medications: Scheduled Meds:  . cefTRIAXone (ROCEPHIN)  IV  2 g Intravenous Q24H  . clopidogrel  75 mg Oral Daily  . fentaNYL  25 mcg Transdermal Q72H  . folic acid  1 mg Oral Daily  . lactulose  10 g Oral TID  . lidocaine  1 patch Transdermal Q24H  . multivitamin with minerals  1 tablet Oral Daily  . rifaximin  550 mg Oral BID  . sodium chloride  3 mL Intravenous Q12H  . thiamine  100 mg Oral Daily    Continuous Infusions: . sodium chloride      PRN Meds: fentaNYL (SUBLIMAZE) injection, ondansetron **OR** ondansetron (ZOFRAN) IV, oxyCODONE  Palliative Performance Scale: 40-50%%     Vital Signs: BP 101/56 mmHg  Pulse 73  Temp(Src) 98 F (36.7 C) (Oral)  Resp 18  Ht 6\' 2"  (1.88 m)  Wt 91.9 kg (202 lb 9.6 oz)  BMI 26.00 kg/m2  SpO2 98% SpO2: SpO2: 98 % O2 Device: O2 Device: Not Delivered O2 Flow Rate: O2 Flow Rate (L/min): 1 L/min  Intake/output summary:  Intake/Output Summary (Last 24 hours) at 12/26/14 1048 Last data filed at 12/26/14 1030  Gross per 24 hour  Intake    170 ml  Output   1775 ml  Net  -1605 ml   LBM:  12/26/14 Baseline Weight: Weight: 92.08 kg (203 lb) Most recent weight: Weight: 91.9 kg (202 lb 9.6 oz)  Physical Exam: General: Middle aged man. Alert , restless. Appears uncomfortable Resp: Good resp effort. Clear to auscultation GI: Abd distended, non-tender. Hyperactive BS. HSM present,  ascites GU: Foley in place. Urine sedimented Musculoskeletal: Focal tenderness to right lower back up into RUQ abdomen. MAE x 4.  Pscyh: Alert. Oriented to self, place and generally his condition. Having some word finding difficulty.             Additional Data Reviewed: Recent Labs     12/25/14  0325  12/26/14  0527  WBC  8.1  7.8  HGB  8.8*  9.1*  PLT  139*  150  NA  132*  133*  BUN  9  7  CREATININE  0.77  0.65     Problem List:  Patient Active Problem List   Diagnosis Date Noted  . Bacteremia, escherichia coli   . Encephalopathy, hepatic   . Acute encephalopathy   . Sepsis   . Altered mental status 12/21/2014  . Cirrhosis with alcoholism 12/21/2014  . Hyponatremia 12/21/2014  . Altered mental state 12/21/2014  . Paroxysmal atrial fibrillation 12/04/2014  . Arterial hypotension   . Rib fractures   . Rib fracture 12/03/2014  . Hypotension 12/03/2014  . Falls 12/03/2014  . Alcohol abuse   . Portal hypertension   . Jaundice   . Thrombocytopenia   . Generalized anxiety disorder   . Alcohol withdrawal   . Alcoholism 11/18/2014  . Ascites 11/18/2014  . Edema  11/18/2014  . Abdominal pain 11/18/2014  . Alcoholic hepatitis with ascites 11/13/2014  . 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 Prostate cancer, primary, with metastasis from prostate to other site 02/13/2012  . History of placement of stent in LAD coronary artery 02/13/2012  . Alcohol abuse with physiological dependence 02/13/2012  . CAD, NATIVE VESSEL 04/26/2010  . HYPERLIPIDEMIA 01/22/2009  . Atrial fibrillation 12/29/2008  . Essential hypertension 02/10/2008     Palliative Care Assessment & Plan    Code Status:  Full code  Goals of Care:  Attempted to set up an appt with his wife and family to provide support and discuss issues that are relevant to pt and family. Wife stated she was too busy to talk to me. Asked when a good time to call her back might be to set up a  time and she stated that calling her was not an option, she would need to call me. Gave her Palliative Medicine Office number  Desire for further Chaplaincy support: not at this time  3. Symptom Management:  Pain: Chart reviewed pt requiring regular prn fentnayl as well as oxycodone to manage back pain. He currently rates it as an 8/10 and states the best it has been was a 4-5. Dr. Hilma Favors recommendation reviewed. Spoke to Dr. Algis Liming. Will start Fentanyl TD 25 mcg q72 hours. Will continue prn fentanyl IVP 25 mcg but will change interval to q4 prn as well as oxycodone prn. Anticipate being able to dc fentanyl injection as fentanyl TD effect takes place overnight. Pt may need oxycodone 5-10 mg q4 prn for break through pain but will hold off changing this until we see how he does on fentanyl td 95mcg   4. Prognosis: > 6 - 12 months  5. Discharge Planning: TBD. Was hoping to meet with wife and family to address goals of care as well as disposition. Will await return call from spouse   Care plan was discussed with Dr. Algis Liming  Thank you for allowing the Palliative Medicine Team to assist in the care of this patient.   Time In: 1030 Time Out: 1100 Total Time 30 Prolonged Time Billed  no    Greater than 50%  of this time was spent counseling and coordinating care related to the above assessment and plan.   Dory Horn, NP  12/26/2014, 10:48 AM  Please contact Palliative Medicine Team phone at [phone] for questions and concerns.

## 2014-12-27 LAB — BASIC METABOLIC PANEL
Anion gap: 6 (ref 5–15)
BUN: 8 mg/dL (ref 6–20)
CALCIUM: 7.6 mg/dL — AB (ref 8.9–10.3)
CHLORIDE: 102 mmol/L (ref 101–111)
CO2: 25 mmol/L (ref 22–32)
CREATININE: 0.71 mg/dL (ref 0.61–1.24)
GFR calc Af Amer: >60 mL/min (ref >60)
GFR calc non Af Amer: >60 mL/min (ref >60)
Glucose, Bld: 82 mg/dL (ref 65–99)
POTASSIUM: 3.9 mmol/L (ref 3.5–5.1)
Sodium: 133 mmol/L — ABNORMAL LOW (ref 135–145)

## 2014-12-27 MED ORDER — LACTULOSE 10 GM/15ML PO SOLN
15.0000 g | Freq: Three times a day (TID) | ORAL | Status: DC
  Administered 2014-12-27 – 2014-12-29 (×6): 15 g via ORAL
  Filled 2014-12-27 (×6): qty 30

## 2014-12-27 MED ORDER — POTASSIUM CHLORIDE CRYS ER 20 MEQ PO TBCR
40.0000 meq | EXTENDED_RELEASE_TABLET | Freq: Once | ORAL | Status: AC
  Administered 2014-12-27: 40 meq via ORAL
  Filled 2014-12-27: qty 2

## 2014-12-27 NOTE — Care Management Note (Signed)
Medicare Important Message given? YES  Date Medicare IM given: 12/27/14 Medicare IM given by: Venita Sheffield RN CCM

## 2014-12-27 NOTE — Progress Notes (Signed)
Daily Progress Note   Patient Name: Allen Schroeder       Date: 12/27/2014 DOB: Jan 29, 1951  Age: 64 y.o. MRN#: [id number] Attending Physician: Modena Jansky, MD Primary Care Physician: [name], MD Admit Date: 12/21/2014  Reason for Consultation/Follow-up: pain management and goals of care. Wife and daughter present  Pt verbalizing pain is better. Per chart review fewer prn's noted.He is more confused today and having trouble finding his words, easily frustrated. Wife at the bedside, she's very overwhelmed with his medical condition and arranging home care services as well as anticipatory grief and loss. Once we discussed pain management ( spouse aware that he is on a fentanyl td) ,. pt raised the subject of "getting on with it so I can be comfortable". I asked what he meant .  Iff he meant decisions that need to be made regarding his health and he said yes. Initially his wife was overwhelmed with talking about this, stating " I wasn't prepared for this today" but her daughter and pt encouraged her to listen which she did. I told he and his family that I felt that one of the initial decisions he could face would be regarding resuscitation or full aggressive measures. He was very clear that he did not want to be resuscitated. Family did ask him was he sure, since " you feel badly today". He again stated he was sure. I also talked about hospice in home services to address practical needs for he and his wife at home. They were receptive but at this point spouse was completely overwhelmed with one more decision and left the room . She did return shortly, was tearful. She does have home health arranged with 24/7 caregivers. Verbalized my concern about home health agencies providing timely pain mgt services after hours. At this point I did come back to DNR issue and asked pt and family if they would like for me to make those changes in terms of changing from full code to DNR and all 3 stated yes.     Subjective: Feels pain is better. He is more confused today. Fewer prn's noted Interval Events: Applied fentanyl TD 80mcg 12/26/14 Length of Stay: 6 days  Current Medications: Scheduled Meds:  . ciprofloxacin  500 mg Oral BID  . clopidogrel  75 mg Oral Daily  . fentaNYL  25 mcg Transdermal Q72H  . folic acid  1 mg Oral Daily  . lactulose  15 g Oral TID  . lidocaine  1 patch Transdermal Q24H  . multivitamin with minerals  1 tablet Oral Daily  . potassium chloride  40 mEq Oral Once  . rifaximin  550 mg Oral BID  . sodium chloride  3 mL Intravenous Q12H  . thiamine  100 mg Oral Daily    Continuous Infusions: . sodium chloride      PRN Meds: fentaNYL (SUBLIMAZE) injection, ondansetron **OR** ondansetron (ZOFRAN) IV, oxyCODONE  Palliative Performance Scale: 40%%     Vital Signs: BP 99/44 mmHg  Pulse 73  Temp(Src) 98.2 F (36.8 C) (Oral)  Resp 18  Ht 6\' 2"  (1.88 m)  Wt 94.1 kg (207 lb 7.3 oz)  BMI 26.62 kg/m2  SpO2 95% SpO2: SpO2: 95 % O2 Device: O2 Device: Not Delivered O2 Flow Rate: O2 Flow Rate (L/min): 1 L/min  Intake/output summary:  Intake/Output Summary (Last 24 hours) at 12/27/14 1351 Last data filed at 12/27/14 1302  Gross per 24 hour  Intake    243 ml  Output  1100 ml  Net   -857 ml   LBM:   Baseline Weight: Weight: 92.08 kg (203 lb) Most recent weight: Weight: 94.1 kg (207 lb 7.3 oz)  Physical Exam: Alert. Oriented to self and his situation but having more word finding difficulty today. Affect frustrated. No work of breathing observed            Additional Data Reviewed: Recent Labs     12/25/14  0325  12/26/14  0527  12/27/14  0527  WBC  8.1  7.8   --   HGB  8.8*  9.1*   --   PLT  139*  150   --   NA  132*  133*  133*  BUN  9  7  8   CREATININE  0.77  0.65  0.71     Problem List:  Patient Active Problem List   Diagnosis Date Noted  . Palliative care encounter   . Right-sided low back pain without sciatica   . Hypokalemia   .  Bacteremia, escherichia coli   . Encephalopathy, hepatic   . Acute encephalopathy   . Sepsis   . Altered mental status 12/21/2014  . Cirrhosis with alcoholism 12/21/2014  . Hyponatremia 12/21/2014  . Altered mental state 12/21/2014  . Paroxysmal atrial fibrillation 12/04/2014  . Arterial hypotension   . Rib fractures   . Rib fracture 12/03/2014  . Hypotension 12/03/2014  . Falls 12/03/2014  . Alcohol abuse   . Portal hypertension   . Jaundice   . Thrombocytopenia   . Generalized anxiety disorder   . Alcohol withdrawal   . Alcoholism 11/18/2014  . Ascites 11/18/2014  . Edema 11/18/2014  . Abdominal pain 11/18/2014  . Alcoholic hepatitis with ascites 11/13/2014  . 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 Prostate cancer, primary, with metastasis from prostate to other site 02/13/2012  . History of placement of stent in LAD coronary artery 02/13/2012  . Alcohol abuse with physiological dependence 02/13/2012  . CAD, NATIVE VESSEL 04/26/2010  . HYPERLIPIDEMIA 01/22/2009  . Atrial fibrillation 12/29/2008  . Essential hypertension 02/10/2008     Palliative Care Assessment & Plan    Code Status:  DNR. Wife present for this discussion as was daughter. Feel that he was understanding this decision and agreed for me to change paper work to DNR  Goals of Care:  His goal is comfort and I do feel his wife is on board but very overwhelmed with all that is happening. I feel that hospice in the home would be the best option and daughter agreed but wife could not make the decision today for me to order consult. In do not think this is from resistance to hospice but from being so overwhelmed she couldn't process one more thing. Would recommend case management follow up prior to dc and see  if they would like to arrange this.  Desire for further Chaplaincy support:no  3. Symptom Management:  Pain: improving. Continue fentanyl TD 25 mcg q 72 hours and oxycodone  5mg  q4 prn for break through pain   5. Prognosis: < 6 months. I did not share this with family  5. Discharge Planning: Home with Home Health but would recommend revisiting this prior to dc by case managment   Care plan was discussed with pt wife and daugheter  Thank you for allowing the Palliative Medicine Team to assist in the care of this patient.   Time In: 1040 Time Out: 1110 Total Time 30 Prolonged Time  Billed  no    Greater than 50%  of this time was spent counseling and coordinating care related to the above assessment and plan.   Dory Horn, NP  12/27/2014, 1:51 PM  Please contact Palliative Medicine Team phone at [phone] for questions and concerns.

## 2014-12-27 NOTE — Progress Notes (Signed)
PROGRESS NOTE    DAE HIGHLEY JOA:[id number] DOB: 11-22-1950 DOA: 12/21/2014 PCP: [name], MD  Primary Oncologist: Dr. Zola Button Primary GI: Dr. Silvano Rusk Primary Cardiologist: Dr. Sherren Mocha.  HPI/Brief narrative 64 y.o. 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 [name]), was on [facility] which was stopped because patient developed jaundiced although he has had an excellent response to Xtandi at least in regards to monitored PSA values. Of note, last CT scan in 11/2014 showed sclerotic lesions from metastatic disease and new sclerotic lesion in L2 area. Further history includes CAD, atrial fibrillation, alcohol abuse, hypertension, recent hospitalization in 11/2014 for fall at which time he sustained rib fractures.  Patient presented from home to [facility] with worsening mental status changes, confusion and fevers. On admission, blood pressure was 77/44, HR 98, RR 16 - 30, T max 102.9 F and oxygen saturation as low as 89% which has improved with [employer] oxygen support. Blood work was notable for leukocytosis of 14.9, hemoglobin 10.8, sodium 127, normal creatinine, INR 1.57. Lactic acid was 2.41 and procalcitonin 0.95. Ammonia level was about 40. Chest x-ray showed low lung volumes with bibasilar atelectasis.   Patient was started on broad-spectrum antibiotics, vanco and zosyn for sepsis although no clear source of infection identified at the time of the admission (now we know his blood cultures are growing gram negative rods). He was admitted to stepdown unit because of hypotension.     Assessment/Plan:  Principal problem: Sepsis secondary to Escherichia coli bacteremia - Sepsis criteria met on the admission with hypotension, tachycardia, tachypnea, fever, hypoxia. Lactic acid was 2.41 and procalcitonin 0.95.  - Source not clear. Possibly from GI translocation. Urine culture negative. C.  difficile PCR negative. Not much ascitic fluid and no symptoms to suggest SBP.  - Patient was admitted to SDU and started on broad-spectrum antibiotics, vancomycin and Zosyn. Vancomycin discontinued. - Clinically improved with temperature defervesced - Mild hypotension has improved with brief IV fluids. - Escherichia coli is pansensitive: Changed IV Zosyn to Rocephin and eventually changed to PO Cipro 5/21 and complete total 14 days treatment. Following treatment for bacteremia, as per GI recommendations-keep on Bactrim prophylaxis for SBP.   Active Problem: Acute respiratory failure with hypoxia - Likely secondary to atelectasis in the context of acute hepatic encephalopathy. - Low index of suspicion for pneumonia. - Chest x-ray on the admission did not reveal acute cardiopulmonary findings other than bibasilar atelectasis. - Resolved  History of prostate cancer with bone metastasis - Follows with Dr. [name] on oncology - Last CT abdomen/pelvis in April 2016 with sclerotic lesions consistent with metastasis and there was a new sclerotic lesion in L2 area. - Oncology input 5/19 appreciated: Recommended discontinuing Xtandi  Anemia of chronic disease -Related to bone marrow failure from alcohol abuse and history of malignancy - Follow CBC in a.m. and transfuse for hemoglobin < 7 g per DL. - Drop in hemoglobin from 10.8 > 8.1 may be related to critical illness and hemodilution. No reported bleeding. - stable  Hypokalemia - Replaced  Hyponatremia - Likely related to liver cirrhosis. - Improved/stable  Alcoholic cirrhosis with coagulopathy - Abdominal US on this admission showed questionable minimal ascites. - Limited in terms of giving lasix, spironolactone and sotalol due to hypotension - GI input 5/19 appreciated: Child Pugh score 12 which gives him a class of C and life expectancy of 1-3 years. Believes Xtandi contributed to rising bilirubin. GI  states that it is possible he could  improve and may reconsider decisions regarding cancer treatment.  Paroxysmal Atrial fibrillation - CHADS vasc score at least 3 - Sotalol on hold due to hypotension/soft BP's - Continue Plavix daily  - Not on other anticoagulation due to risk of falls and alcohol abuse   Acute hepatic encephalopathy - Secondary to acute illness complicating alcoholic cirrhosis. Ammonia level is around 40 on this admission. - Continue lactulose and rifaximin added - Minimize opioids and sedative medications  - rectal tube removed 5/19. - Increased dose of lactulose 5/21due to inadequate BMs (target 3 BMs per day-requested nursing to document). 5 BMs documented in last 24 hours. As per nursing, patient seems more confused today than yesterday (mental status seems to be fluctuating). May have to cut back on dose of lactulose.  Right-sided pleuritic/musculoskeletal chest pain - Related to prior rib fractures. - Chest x-ray 5/16 showed bibasilar atelectasis but could not see previous rib fractures - Pain management: Minimize opioids.  - Added lidocaine patch with better pain control. - Palliative care team adjusting pain regimen.  Failure to thrive - Secondary to alcoholic cirrhosis and metastatic prostate cancer. - Overall prognosis from liver cirrhosis is poor.  - Patient keen to go on hospice. Family not sure but are agreeable to palliative care consult for goals of care.  - palliative care input/recommendations appreciated-patient has repeatedly expressed desire for hospice care. However family not in agreement with his wishes .  History of alcohol abuse - As per family, patient has not had alcohol intake since 11/17/14 - No overt withdrawal features.  Nonsustained SVT and? VT - Seen on monitor. Asymptomatic. - Check 2-D echo: Normal EF - TSH recently normal. - Continue monitoring on telemetry -Unable to resume beta blocker secondary to soft blood pressures  - Attempt to keep potassium >4 and  magnesium >2   Code Status: Full Family Communication: Discussed extensively with patient's spouse at bedside on 5/22 Disposition Plan:  Possible DC to SNF in 24-48 hours. Spouse is aware of plan and is exploring SNF options versus home. She also wanted to find out inpatient rehabilitation options-consult requested.   Consultants:  CCM-signed off 5/17  Palliative care team  [facility]   Oncology   Procedures:  Rectal tube-DC'd  Condom cath  Antibiotics:  IV cefepime 1 dose on 5/16  IV Zosyn 5/16 > 5/19  IV vancomycin 2 doses on 5/16  Rifaximin 5/17>  IV Rocephin 5/19 > 5/21   oral Cipro 5/21 >  Subjective: Continues with mild confusion. Denies complaints.  Objective: Filed Vitals:   12/26/14 0530 12/26/14 1335 12/26/14 2057 12/27/14 0541  BP: 101/56 111/65 102/54 99/44  Pulse: 73 81 80 73  Temp: 98 F (36.7 C) 97.6 F (36.4 C) 98.6 F (37 C) 98.2 F (36.8 C)  TempSrc: Oral Oral Oral Oral  Resp: 18 20 18 18   Height:      Weight: 91.9 kg (202 lb 9.6 oz)   94.1 kg (207 lb 7.3 oz)  SpO2: 98% 95% 94% 95%    Intake/Output Summary (Last 24 hours) at 12/27/14 1341 Last data filed at 12/27/14 1302  Gross per 24 hour  Intake    243 ml  Output   1100 ml  Net   -857 ml   Filed Weights   12/24/14 0406 12/26/14 0530 12/27/14 0541  Weight: 96 kg (211 lb 10.3 oz) 91.9 kg (202 lb 9.6 oz) 94.1 kg (207 lb 7.3 oz)  Exam:  General exam: Moderately built and nourished middle-aged chronically ill-looking male sitting up comfortably in bed without distress. Respiratory system: clear to auscultation. No increased work of breathing. Cardiovascular system: S1 & S2 heard, RRR. No JVD, murmurs, gallops, clicks. Trace pedal edema. Telemetry: Sinus rhythm. No further arrhythmias noted.  Gastrointestinal system: Abdomen is mildly distended, soft and nontender. Normal bowel sounds heard. Central nervous system: Alert and oriented 2 . No focal neurological deficits.  asterixis + (seems less).  Extremities: Symmetric 5 x 5 power.   Data Reviewed: Basic Metabolic Panel:  Recent Labs Lab 12/23/14 0312 12/24/14 0510 12/25/14 0325 12/25/14 1815 12/26/14 0527 12/27/14 0527  NA 135 132* 132*  --  133* 133*  K 3.7 3.7 3.5  --  3.4* 3.9  CL 105 103 101  --  101 102  CO2 24 24 23   --  26 25  GLUCOSE 92 81 87  --  84 82  BUN 11 11 9   --  7 8  CREATININE 0.97 0.83 0.77  --  0.65 0.71  CALCIUM 7.2* 7.4* 7.2*  --  7.6* 7.6*  MG  --   --   --  2.0  --   --    Liver Function Tests:  Recent Labs Lab 12/21/14 2227 12/22/14 0415 12/24/14 0510 12/25/14 0325 12/26/14 0527  AST 83* 77* 74* 81* 83*  ALT 32 33 34 32 34  ALKPHOS 117 107 105 118 127*  BILITOT 4.8* 4.9* 3.2* 2.6* 2.7*  PROT 6.4* 6.3* 5.9* 6.1* 6.1*  ALBUMIN 2.3* 2.1* 1.9* 2.0* 2.0*   No results for input(s): LIPASE, AMYLASE in the last 168 hours.  Recent Labs Lab 12/21/14 1705 12/24/14 0510  AMMONIA 41* 33   CBC:  Recent Labs Lab 12/21/14 2227 12/22/14 0415 12/23/14 1700 12/24/14 0510 12/25/14 0325 12/26/14 0527  WBC 14.1* 15.0* 7.8 7.0 8.1 7.8  NEUTROABS 12.3*  --   --   --   --   --   HGB 9.2* 8.1* 9.3* 8.9* 8.8* 9.1*  HCT 27.6* 24.7* 27.6* 26.4* 25.1* 27.9*  MCV 122.1* 121.1* 120.5* 120.0* 118.4* 118.2*  PLT 202 164 153 140* 139* 150   Cardiac Enzymes: No results for input(s): CKTOTAL, CKMB, CKMBINDEX, TROPONINI in the last 168 hours. BNP (last 3 results) No results for input(s): PROBNP in the last 8760 hours. CBG:  Recent Labs Lab 12/22/14 0737 12/23/14 0745  GLUCAP 96 89    Recent Results (from the past 240 hour(s))  Blood culture (routine x 2)     Status: None   Collection Time: 12/21/14  4:48 PM  Result Value Ref Range Status   Specimen Description BLOOD RAC  Final   Special Requests BOTTLES DRAWN AEROBIC AND ANAEROBIC 5CC  Final   Culture   Final    ESCHERICHIA COLI Note: SUSCEPTIBILITIES PERFORMED ON PREVIOUS CULTURE WITHIN THE LAST 5  DAYS. Note: Gram Stain Report Called to,Read Back By and Verified With: TAMMY PENNINGTON 12/22/14 @ 0950 BY PARDA Performed at [facility]    Report Status 12/24/2014 FINAL  Final  Blood culture (routine x 2)     Status: None   Collection Time: 12/21/14  4:48 PM  Result Value Ref Range Status   Specimen Description BLOOD LAC  Final   Special Requests BOTTLES DRAWN AEROBIC AND ANAEROBIC 5CC  Final   Culture   Final    ESCHERICHIA COLI Note: Gram Stain Report Called to,Read Back By and Verified With: [place] [id number] [place]  [id number] BY [facility] Performed at [facility]    Report Status 12/24/2014 FINAL  Final   Organism ID, Bacteria ESCHERICHIA COLI  Final      Susceptibility   Escherichia coli - MIC*    AMPICILLIN <=2 SENSITIVE Sensitive     AMPICILLIN/SULBACTAM <=2 SENSITIVE Sensitive     CEFAZOLIN <=4 SENSITIVE Sensitive     CEFEPIME <=1 SENSITIVE Sensitive     CEFTAZIDIME <=1 SENSITIVE Sensitive     CEFTRIAXONE <=1 SENSITIVE Sensitive     CIPROFLOXACIN <=0.25 SENSITIVE Sensitive     GENTAMICIN <=1 SENSITIVE Sensitive     IMIPENEM <=0.25 SENSITIVE Sensitive     PIP/TAZO <=4 SENSITIVE Sensitive     TOBRAMYCIN <=1 SENSITIVE Sensitive     TRIMETH/SULFA <=20 SENSITIVE Sensitive     * ESCHERICHIA COLI  Urine culture     Status: None   Collection Time: 12/21/14  6:16 PM  Result Value Ref Range Status   Specimen Description URINE, CATHETERIZED  Final   Special Requests Immunocompromised  Final   Colony Count NO GROWTH Performed at [facility]   Final   Culture NO GROWTH Performed at [facility]   Final   Report Status 12/22/2014 FINAL  Final  Culture, blood (x 2)     Status: None (Preliminary result)   Collection Time: 12/21/14 10:08 PM  Result Value Ref Range Status   Specimen Description BLOOD RHAND  Final   Special Requests BOTTLES DRAWN AEROBIC AND ANAEROBIC 5CC  Final   Culture   Final           BLOOD CULTURE RECEIVED NO GROWTH TO DATE  CULTURE WILL BE HELD FOR 5 DAYS BEFORE ISSUING A FINAL NEGATIVE REPORT Performed at [facility]    Report Status PENDING  Incomplete  Culture, blood (x 2)     Status: None (Preliminary result)   Collection Time: 12/21/14 10:27 PM  Result Value Ref Range Status   Specimen Description BLOOD LHAND  Final   Special Requests BOTTLES DRAWN AEROBIC ONLY 1CC  Final   Culture   Final           BLOOD CULTURE RECEIVED NO GROWTH TO DATE CULTURE WILL BE HELD FOR 5 DAYS BEFORE ISSUING A FINAL NEGATIVE REPORT Note: Culture results may be compromised due to an inadequate volume of blood received in culture bottles. Performed at [facility]    Report Status PENDING  Incomplete  MRSA PCR Screening     Status: None   Collection Time: 12/21/14 11:08 PM  Result Value Ref Range Status   MRSA by PCR NEGATIVE NEGATIVE Final    Comment:        The GeneXpert MRSA Assay (FDA approved for NASAL specimens only), is one component of a comprehensive MRSA colonization surveillance program. It is not intended to diagnose MRSA infection nor to guide or monitor treatment for MRSA infections.   Clostridium Difficile by PCR     Status: None   Collection Time: 12/21/14 11:58 PM  Result Value Ref Range Status   C difficile by pcr NEGATIVE NEGATIVE Final           Studies: No results found.      Scheduled Meds: . ciprofloxacin  500 mg Oral BID  . clopidogrel  75 mg Oral Daily  . fentaNYL  25 mcg Transdermal Q72H  . folic acid  1 mg Oral Daily  . lactulose  20 g Oral TID  . lidocaine  1 patch  Transdermal Q24H  . multivitamin with minerals  1 tablet Oral Daily  . rifaximin  550 mg Oral BID  . sodium chloride  3 mL Intravenous Q12H  . thiamine  100 mg Oral Daily   Continuous Infusions: . sodium chloride      Active Problems:   Essential hypertension   Atrial fibrillation   Ascites   Altered mental status   Cirrhosis with alcoholism   Hyponatremia   Altered mental  state   Acute encephalopathy   Sepsis   Bacteremia, escherichia coli   Encephalopathy, hepatic   Palliative care encounter   Right-sided low back pain without sciatica   Hypokalemia    Time spent: 90 minutes    HONGALGI,ANAND, MD, FACP, FHM. [facility] Pager [phone]  If 7PM-7AM, please contact night-coverage [email or web address] Password [email or web address] 12/27/2014, 1:41 PM    LOS: 6 days

## 2014-12-27 NOTE — Progress Notes (Signed)
Physical Therapy Treatment Patient Details Name: WEAVER TWEED MRN: [id number] DOB: 1950/11/17 Today's Date: 12/27/2014    History of Present Illness ANDREAS SOBOLEWSKI is a 64 y.o. male with cirrhosis of the liver with alcohol abuse, atrial fibrillation, metastatic prostate cancer, fall in April 2016 with rib fxs and HTN presents to the ED with altered mental status.     PT Comments    Pt in bed feeling "poorly" but willing to try.  RN stated pt was given meds to assist with BM's so assisted pt OOB to [facility] first thing.  Pt required + 2 assist for safety and due to pt weakness.  Assisted with hygiene as pt was unable to self perform and maintain safe standing balance.  Amb in hallway a limited distance with + 2 assist for safety such that recliner was following.  Positioned pt in recliner.  Pt will need ST Rehab at SNF prior to returning home.   Follow Up Recommendations  SNF     Equipment Recommendations  None recommended by PT    Recommendations for Other Services       Precautions / Restrictions Precautions Precautions: Fall Precaution Comments: fell in April, sustained R rib fxs Restrictions Weight Bearing Restrictions: No    Mobility  Bed Mobility Overal bed mobility: Needs Assistance Bed Mobility: Supine to Sit           General bed mobility comments: assist to raise trunk, verbal/tactile cues for technique  Transfers Overall transfer level: Needs assistance Equipment used: None Transfers: Sit to/from Stand;Stand Pivot Transfers Sit to Stand: +2 safety/equipment;Mod assist;Min assist Stand pivot transfers: +2 safety/equipment;Mod assist;Min assist       General transfer comment: verbal cues for hand placement, assist to rise and steady plus turn completion  Ambulation/Gait Ambulation/Gait assistance: +2 safety/equipment;Min assist;Mod assist Ambulation Distance (Feet): 40 Feet Assistive device: Rolling walker (2 wheeled) Gait Pattern/deviations: Step-to  pattern;Step-through pattern;Trunk flexed;Narrow base of support Gait velocity: decreased   General Gait Details: limited activity tolerance.  Fatigues quickly. 50% Vc's upright posture   Stairs            Wheelchair Mobility    Modified Rankin (Stroke Patients Only)       Balance                                    Cognition Arousal/Alertness: Awake/alert Behavior During Therapy: WFL for tasks assessed/performed Overall Cognitive Status: Impaired/Different from baseline                      Exercises      General Comments        Pertinent Vitals/Pain Pain Assessment: 0-10 Pain Score: 5  Pain Location: R ribs Pain Descriptors / Indicators: Sore;Tender Pain Intervention(s): Monitored during session;Repositioned    Home Living                      Prior Function            PT Goals (current goals can now be found in the care plan section) Progress towards PT goals: Progressing toward goals    Frequency  Min 3X/week    PT Plan      Co-evaluation             End of Session Equipment Utilized During Treatment: Gait belt Activity Tolerance: Patient limited by fatigue Patient left: in chair;with  call bell/phone within reach;with chair alarm set     Time: [phone] PT Time Calculation (min) (ACUTE ONLY): 25 min  Charges:  $Gait Training: 8-22 mins $Therapeutic Activity: 8-22 mins                    G Codes:      Rica Koyanagi  PTA [facility]  Acute  Rehab Pager      [phone]

## 2014-12-28 ENCOUNTER — Encounter: Payer: Self-pay | Admitting: Internal Medicine

## 2014-12-28 ENCOUNTER — Encounter (HOSPITAL_COMMUNITY): Payer: Self-pay

## 2014-12-28 ENCOUNTER — Inpatient Hospital Stay (HOSPITAL_COMMUNITY): Payer: Medicare Other

## 2014-12-28 LAB — CULTURE, BLOOD (ROUTINE X 2)
CULTURE: NO GROWTH
CULTURE: NO GROWTH

## 2014-12-28 NOTE — Progress Notes (Signed)
Spoke with pt's wife Allen Schroeder concerning discharge to home, home with Hospice or SNF.  Allen Schroeder selected home with [facility] and private sitters. Allen Schroeder declined Hospice and will not have assistance at home with pt until in am.  Plan to dc home.

## 2014-12-28 NOTE — Progress Notes (Addendum)
PROGRESS NOTE    Allen Schroeder KPT:[id number] DOB: Mar 09, 1951 DOA: 12/21/2014 PCP: Allen Ly, MD  Primary Oncologist: Dr. Zola Schroeder Primary GI: Dr. Silvano Schroeder Primary Cardiologist: Dr. Sherren Schroeder.  HPI/Brief narrative 64 y.o. 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 Alen Schroeder), was on [facility] which was stopped because patient developed jaundiced although he has had an excellent response to Allen Schroeder at least in regards to monitored PSA values. Of note, last CT scan in 11/2014 showed sclerotic lesions from metastatic disease and new sclerotic lesion in L2 area. Further history includes CAD, atrial fibrillation, alcohol abuse, hypertension, recent hospitalization in 11/2014 for fall at which time he sustained rib fractures.  Patient presented from home to [facility] with worsening mental status changes, confusion and fevers. On admission, blood pressure was 77/44, HR 98, RR 16 - 30, T max 102.9 F and oxygen saturation as low as 89% which has improved with [place] oxygen support. Blood work was notable for leukocytosis of 14.9, hemoglobin 10.8, sodium 127, normal creatinine, INR 1.57. Lactic acid was 2.41 and procalcitonin 0.95. Ammonia level was about 40. Chest x-ray showed low lung volumes with bibasilar atelectasis.   Patient was started on broad-spectrum antibiotics, vanco and zosyn for sepsis although no clear source of infection identified at the time of the admission (now we know his blood cultures are growing gram negative rods). He was admitted to stepdown unit because of hypotension.     Assessment/Plan:  Principal problem: Sepsis secondary to Escherichia coli bacteremia - Sepsis criteria met on the admission with hypotension, tachycardia, tachypnea, fever, hypoxia. Lactic acid was 2.41 and procalcitonin 0.95.  - Source not clear. Possibly from GI translocation. Urine culture negative. C.  difficile PCR negative. Not much ascitic fluid and no symptoms to suggest SBP.  - Patient was admitted to SDU and started on broad-spectrum antibiotics, vancomycin and Zosyn. Vancomycin discontinued. - Clinically improved with temperature defervesced - Mild hypotension has improved with brief IV fluids. - Escherichia coli is pansensitive: Changed IV Zosyn to Rocephin and eventually changed to PO Cipro 5/21 and complete total 14 days treatment. Following treatment for bacteremia, as per GI recommendations-keep on Bactrim prophylaxis for SBP.   Active Problem: Acute respiratory failure with hypoxia - Likely secondary to atelectasis in the context of acute hepatic encephalopathy. - Low index of suspicion for pneumonia. - Chest x-ray on the admission did not reveal acute cardiopulmonary findings other than bibasilar atelectasis. - Resolved  History of prostate cancer with bone metastasis - Follows with Dr. Alen Schroeder on oncology - Last CT abdomen/pelvis in April 2016 with sclerotic lesions consistent with metastasis and there was a new sclerotic lesion in L2 area. - Oncology input 5/19 appreciated: Recommended discontinuing Allen Schroeder  Anemia of chronic disease -Related to bone marrow failure from alcohol abuse and history of malignancy - Follow CBC in a.m. and transfuse for hemoglobin < 7 g per DL. - Drop in hemoglobin from 10.8 > 8.1 may be related to critical illness and hemodilution. No reported bleeding. - stable  Hypokalemia - Replaced  Hyponatremia - Likely related to liver cirrhosis. - Improved/stable  Alcoholic cirrhosis with coagulopathy - Abdominal US on this admission showed questionable minimal ascites. - Limited in terms of giving lasix, spironolactone and sotalol due to hypotension - GI input 5/19 appreciated: Child Pugh score 12 which gives him a class of C and life expectancy of 1-3 years. Believes Allen Schroeder contributed to rising bilirubin. GI  states that it is possible he could  improve and may reconsider decisions regarding cancer treatment.  Paroxysmal Atrial fibrillation - CHADS vasc score at least 3 - Sotalol on hold due to hypotension/soft BP's - Continue Plavix daily  - Not on other anticoagulation due to risk of falls and alcohol abuse   Acute hepatic encephalopathy - Secondary to acute illness complicating alcoholic cirrhosis. Ammonia level is around 40 on this admission. - Continue lactulose and rifaximin added - Minimize opioids and sedative medications  - rectal tube removed 5/19. - Adjusted lactulose dose and having appropriate BMs. - Continues to be intermittently mildly confused-? Now at baseline. - Had CT head 4/28 without acute findings. We will repeat CT head given prior history of fall to rule out gradual onset of subdural hematoma.  Right-sided pleuritic/musculoskeletal chest pain - Related to prior rib fractures. - Chest x-ray 5/16 showed bibasilar atelectasis but could not see previous rib fractures - Pain management: Minimize opioids.  - Added lidocaine patch with better pain control. - Palliative care team adjusting pain regimen.  Failure to thrive - Secondary to alcoholic cirrhosis and metastatic prostate cancer. - Overall prognosis from liver cirrhosis is poor.  - Patient keen to go on hospice. Family not sure but are agreeable to palliative care consult for goals of care.  - palliative care input/recommendations appreciated-patient has repeatedly expressed desire for hospice care. However family not in agreement with his wishes .  History of alcohol abuse - As per family, patient has not had alcohol intake since 11/17/14 - No overt withdrawal features.  Nonsustained SVT and? VT - Seen on monitor. Asymptomatic. - Check 2-D echo: Normal EF - TSH recently normal. - Continue monitoring on telemetry -Unable to resume beta blocker secondary to soft blood pressures  - Attempt to keep potassium >4 and magnesium >2  DVT  prophylaxis: SCDs. Not on heparin products secondary to coagulopathy. Code Status: DO NOT RESUSCITATE Family Communication: Discussed extensively with patient's spouse at bedside on 5/22 Disposition Plan:  Possible DC home 5/24. Spouses finally decided to take patient home with home care services  Consultants:  CCM-signed off 5/17  Palliative care team  [facility]   Oncology   Procedures:  Rectal tube-DC'd  Condom cath  Antibiotics:  IV cefepime 1 dose on 5/16  IV Zosyn 5/16 > 5/19  IV vancomycin 2 doses on 5/16  Rifaximin 5/17>  IV Rocephin 5/19 > 5/21   oral Cipro 5/21 >  Subjective: Continues with mild confusion. Denies complaints.  Objective: Filed Vitals:   12/28/14 1403 12/28/14 1606 12/28/14 1607 12/28/14 1608  BP: 102/59 105/72 105/61 108/64  Pulse: 73 84 85 97  Temp: 99 F (37.2 C)     TempSrc: Oral     Resp: 16     Height:      Weight:      SpO2: 93%       Intake/Output Summary (Last 24 hours) at 12/28/14 1616 Last data filed at 12/28/14 1410  Gross per 24 hour  Intake 332.17 ml  Output   1500 ml  Net -1167.83 ml   Filed Weights   12/26/14 0530 12/27/14 0541 12/28/14 0451  Weight: 91.9 kg (202 lb 9.6 oz) 94.1 kg (207 lb 7.3 oz) 93.8 kg (206 lb 12.7 oz)     Exam:  General exam: Moderately built and nourished middle-aged chronically ill-looking male sitting up comfortably in bed without distress. Respiratory system: clear to auscultation. No increased work of breathing. Cardiovascular system: S1 &  S2 heard, RRR. No JVD, murmurs, gallops, clicks. Trace pedal edema. Telemetry: Sinus rhythm with occasional PVCs. No further arrhythmias noted. DC telemetry 5/23 Gastrointestinal system: Abdomen is mildly distended, soft and nontender. Normal bowel sounds heard. Central nervous system: Alert and oriented 2 . No focal neurological deficits. asterixis + (seems less).  Extremities: Symmetric 5 x 5 power.   Data Reviewed: Basic Metabolic  Panel:  Recent Labs Lab 12/23/14 0312 12/24/14 0510 12/25/14 0325 12/25/14 1815 12/26/14 0527 12/27/14 0527  NA 135 132* 132*  --  133* 133*  K 3.7 3.7 3.5  --  3.4* 3.9  CL 105 103 101  --  101 102  CO2 24 24 23   --  26 25  GLUCOSE 92 81 87  --  84 82  BUN 11 11 9   --  7 8  CREATININE 0.97 0.83 0.77  --  0.65 0.71  CALCIUM 7.2* 7.4* 7.2*  --  7.6* 7.6*  MG  --   --   --  2.0  --   --    Liver Function Tests:  Recent Labs Lab 12/21/14 2227 12/22/14 0415 12/24/14 0510 12/25/14 0325 12/26/14 0527  AST 83* 77* 74* 81* 83*  ALT 32 33 34 32 34  ALKPHOS 117 107 105 118 127*  BILITOT 4.8* 4.9* 3.2* 2.6* 2.7*  PROT 6.4* 6.3* 5.9* 6.1* 6.1*  ALBUMIN 2.3* 2.1* 1.9* 2.0* 2.0*   No results for input(s): LIPASE, AMYLASE in the last 168 hours.  Recent Labs Lab 12/21/14 1705 12/24/14 0510  AMMONIA 41* 33   CBC:  Recent Labs Lab 12/21/14 2227 12/22/14 0415 12/23/14 1700 12/24/14 0510 12/25/14 0325 12/26/14 0527  WBC 14.1* 15.0* 7.8 7.0 8.1 7.8  NEUTROABS 12.3*  --   --   --   --   --   HGB 9.2* 8.1* 9.3* 8.9* 8.8* 9.1*  HCT 27.6* 24.7* 27.6* 26.4* 25.1* 27.9*  MCV 122.1* 121.1* 120.5* 120.0* 118.4* 118.2*  PLT 202 164 153 140* 139* 150   Cardiac Enzymes: No results for input(s): CKTOTAL, CKMB, CKMBINDEX, TROPONINI in the last 168 hours. BNP (last 3 results) No results for input(s): PROBNP in the last 8760 hours. CBG:  Recent Labs Lab 12/22/14 0737 12/23/14 0745  GLUCAP 96 89    Recent Results (from the past 240 hour(s))  Blood culture (routine x 2)     Status: None   Collection Time: 12/21/14  4:48 PM  Result Value Ref Range Status   Specimen Description BLOOD RAC  Final   Special Requests BOTTLES DRAWN AEROBIC AND ANAEROBIC 5CC  Final   Culture   Final    ESCHERICHIA COLI Note: SUSCEPTIBILITIES PERFORMED ON PREVIOUS CULTURE WITHIN THE LAST 5 DAYS. Note: Gram Stain Report Called to,Read Back By and Verified With: TAMMY PENNINGTON 12/22/14 @ 0950 BY  PARDA Performed at [facility]    Report Status 12/24/2014 FINAL  Final  Blood culture (routine x 2)     Status: None   Collection Time: 12/21/14  4:48 PM  Result Value Ref Range Status   Specimen Description BLOOD LAC  Final   Special Requests BOTTLES DRAWN AEROBIC AND ANAEROBIC 5CC  Final   Culture   Final    ESCHERICHIA COLI Note: Gram Stain Report Called to,Read Back By and Verified With: TAMMY PENNINGTON [id number] ON [id number] BY [facility] Performed at [facility]    Report Status 12/24/2014 FINAL  Final   Organism ID, Bacteria ESCHERICHIA COLI  Final  Susceptibility   Escherichia coli - MIC*    AMPICILLIN <=2 SENSITIVE Sensitive     AMPICILLIN/SULBACTAM <=2 SENSITIVE Sensitive     CEFAZOLIN <=4 SENSITIVE Sensitive     CEFEPIME <=1 SENSITIVE Sensitive     CEFTAZIDIME <=1 SENSITIVE Sensitive     CEFTRIAXONE <=1 SENSITIVE Sensitive     CIPROFLOXACIN <=0.25 SENSITIVE Sensitive     GENTAMICIN <=1 SENSITIVE Sensitive     IMIPENEM <=0.25 SENSITIVE Sensitive     PIP/TAZO <=4 SENSITIVE Sensitive     TOBRAMYCIN <=1 SENSITIVE Sensitive     TRIMETH/SULFA <=20 SENSITIVE Sensitive     * ESCHERICHIA COLI  Urine culture     Status: None   Collection Time: 12/21/14  6:16 PM  Result Value Ref Range Status   Specimen Description URINE, CATHETERIZED  Final   Special Requests Immunocompromised  Final   Colony Count NO GROWTH Performed at [facility]   Final   Culture NO GROWTH Performed at [facility]   Final   Report Status 12/22/2014 FINAL  Final  Culture, blood (x 2)     Status: None   Collection Time: 12/21/14 10:08 PM  Result Value Ref Range Status   Specimen Description BLOOD RHAND  Final   Special Requests BOTTLES DRAWN AEROBIC AND ANAEROBIC 5CC  Final   Culture   Final    NO GROWTH 5 DAYS Performed at [facility]    Report Status 12/28/2014 FINAL  Final  Culture, blood (x 2)     Status: None   Collection Time: 12/21/14 10:27 PM    Result Value Ref Range Status   Specimen Description BLOOD LHAND  Final   Special Requests BOTTLES DRAWN AEROBIC ONLY 1CC  Final   Culture   Final    NO GROWTH 5 DAYS Note: Culture results may be compromised due to an inadequate volume of blood received in culture bottles. Performed at [facility]    Report Status 12/28/2014 FINAL  Final  MRSA PCR Screening     Status: None   Collection Time: 12/21/14 11:08 PM  Result Value Ref Range Status   MRSA by PCR NEGATIVE NEGATIVE Final    Comment:        The GeneXpert MRSA Assay (FDA approved for NASAL specimens only), is one component of a comprehensive MRSA colonization surveillance program. It is not intended to diagnose MRSA infection nor to guide or monitor treatment for MRSA infections.   Clostridium Difficile by PCR     Status: None   Collection Time: 12/21/14 11:58 PM  Result Value Ref Range Status   C difficile by pcr NEGATIVE NEGATIVE Final           Studies: No results found.      Scheduled Meds: . ciprofloxacin  500 mg Oral BID  . clopidogrel  75 mg Oral Daily  . fentaNYL  25 mcg Transdermal Q72H  . folic acid  1 mg Oral Daily  . lactulose  15 g Oral TID  . lidocaine  1 patch Transdermal Q24H  . multivitamin with minerals  1 tablet Oral Daily  . rifaximin  550 mg Oral BID  . sodium chloride  3 mL Intravenous Q12H  . thiamine  100 mg Oral Daily   Continuous Infusions: . sodium chloride 10 mL/hr at 12/28/14 0867    Active Problems:   Essential hypertension   Atrial fibrillation   Ascites   Altered mental status   Cirrhosis with alcoholism   Hyponatremia  Altered mental state   Acute encephalopathy   Sepsis   Bacteremia, escherichia coli   Encephalopathy, hepatic   Palliative care encounter   Right-sided low back pain without sciatica   Hypokalemia    Time spent: 15 minutes    Serenna Deroy, MD, FACP, FHM. [facility] Pager [phone]  If 7PM-7AM, please contact  night-coverage [email or web address] Password [email or web address] 12/28/2014, 4:16 PM    LOS: 7 days

## 2014-12-28 NOTE — Progress Notes (Signed)
CSW continuing to follow.   Per update from [facility], RNCM spoke with pt wife and plan is to return home with home health services. Pt wife declined hospice.   CSW left message with pt wife via telephone as CSW had received message to contact pt wife.   CSW to continue to follow to provide support and assist as appropriate.  Alison Murray, MSW, [place] [employer] [phone]

## 2014-12-28 NOTE — Progress Notes (Signed)
Spoke with Janine from in pt rehab concerning an order for in pt rehab. Pt is DNR, palliative involved, Janine feel that in light of pt's overall plan of care goals that SNF or home hospice care is most appropriate. Will continue to follow up with pt.

## 2014-12-28 NOTE — Progress Notes (Signed)
Rehab Admissions Coordinator Note:  Patient was screened by Elyanah Farino L for appropriateness for an Inpatient Acute Rehab Consult.  At this time, we are recommending [facility] or home with hospice care. I have reviewed pt's case (noting also that PT recommends SNF) and made note of yesterday's palliative care note and discussions with family. I called and updated Cookie, case manager and feel that in light of pt's overall plan of care goals that SNF or home hospice care is most appropriate.  Mujtaba Bollig L 12/28/2014, 8:41 AM  I can be reached at [phone].

## 2014-12-28 NOTE — Progress Notes (Addendum)
Daily Progress Note   Patient Name: Allen Schroeder       Date: 12/28/2014 DOB: 1951-03-21  Age: 64 y.o. MRN#: [id number] Attending Physician: Modena Jansky, MD Primary Care Physician: [name], MD Admit Date: 12/21/2014  Reason for Consultation/Follow-up: Establishing goals of care, Family-clinician negotiation and Pain control  Subjective: Mild pain persists but generally well controlled. Lucid with clear speech and good recall. No cardiac symptoms, no abdominal pain.  Interval Events: No new medical events noted. Per record patient appears to be stable.Reviewed GI and Oncology notes from May 19th. Reviewed PMT notes Length of Stay: 7 days  Current Medications: Scheduled Meds:   ciprofloxacin  500 mg Oral BID   clopidogrel  75 mg Oral Daily   fentaNYL  25 mcg Transdermal T24P   folic acid  1 mg Oral Daily   lactulose  15 g Oral TID   lidocaine  1 patch Transdermal Q24H   multivitamin with minerals  1 tablet Oral Daily   rifaximin  550 mg Oral BID   sodium chloride  3 mL Intravenous Q12H   thiamine  100 mg Oral Daily    Continuous Infusions:  sodium chloride 10 mL/hr at 12/28/14 0747    PRN Meds: fentaNYL (SUBLIMAZE) injection, ondansetron **OR** ondansetron (ZOFRAN) IV, oxyCODONE  Palliative Performance Scale: 50%     Vital Signs: BP 104/62 mmHg   Pulse 71   Temp(Src) 98.5 F (36.9 C) (Oral)   Resp 16   Ht 6\' 2"  (1.88 m)   Wt 93.8 kg (206 lb 12.7 oz)   BMI 26.54 kg/m2   SpO2 93% SpO2: SpO2: 93 % O2 Device: O2 Device: Not Delivered O2 Flow Rate: O2 Flow Rate (L/min): 1 L/min  Intake/output summary:  Intake/Output Summary (Last 24 hours) at 12/28/14 8099 Last data filed at 12/28/14 0450  Gross per 24 hour  Intake     43 ml  Output   1475 ml  Net  -1432 ml   LBM:   Baseline Weight: Weight: 92.08 kg (203 lb) Most recent weight: Weight: 93.8 kg (206 lb 12.7 oz)  Physical Exam: Gen'l - WNWD white man in no distress HEENT - no scleral  icterus Cor - 2+ radial, RRR, no mm/r/g Abd- protruberant, BS +, firm but no fluid wave, non-tender Neuro - A&OI x 3 Derm - clear              Additional Data Reviewed: Recent Labs     12/26/14  0527  12/27/14  0527  WBC  7.8   --   HGB  9.1*   --   PLT  150   --   NA  133*  133*  BUN  7  8  CREATININE  0.65  0.71     Problem List:  Patient Active Problem List   Diagnosis Date Noted   Palliative care encounter    Right-sided low back pain without sciatica    Hypokalemia    Bacteremia, escherichia coli    Encephalopathy, hepatic    Acute encephalopathy    Sepsis    Altered mental status 12/21/2014   Cirrhosis with alcoholism 12/21/2014   Hyponatremia 12/21/2014   Altered mental state 12/21/2014   Paroxysmal atrial fibrillation 12/04/2014   Arterial hypotension    Rib fractures    Rib fracture 12/03/2014   Hypotension 12/03/2014   Falls 12/03/2014   Alcohol abuse    Portal hypertension    Jaundice    Thrombocytopenia  Generalized anxiety disorder    Alcohol withdrawal    Alcoholism 11/18/2014   Ascites 11/18/2014   Edema 11/18/2014   Abdominal pain 62/95/2841   Alcoholic hepatitis with ascites 11/13/2014   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   Desire for further Chaplaincy support:no  3. Symptom Management:  Med record reviewed: in the last 24 hours: 1 dose fentanyl, 2 doses oxycodone 5 mg. Continues Fentanyl patch 25 mcg. At  discharge should go home on fentanyl patch 25 mcg, Oxy IR 5 mg for breakthrough. Dr. Abner Greenspan to reevaluated and adjust pain medications at outpatient follow-up.   4. Palliative Prophylaxis:  Stool Softner: on lactulose - 3-5 stools daily  5. Prognosis: > 6 - 12 months  5. Discharge Planning: Home with Home Health or hospice if hospice eligible along with PT/OT by [place] was discussed with patient, youngest son and wife.   Thank you for allowing the Palliative Medicine Team to assist in the care of this patient.   Time In: 0937 Time Out: 1034 Total Time 57 Prolonged Time Billed  no     Greater than 50%  of this time was spent counseling and coordinating care related to the above assessment and plan.   Adella Hare, MD Palliative Care Team   12/28/2014, 9:37 AM  Please contact Palliative Medicine Team phone at [phone] for questions and concerns.

## 2014-12-29 DIAGNOSIS — R627 Adult failure to thrive: Secondary | ICD-10-CM

## 2014-12-29 MED ORDER — OXYCODONE HCL 5 MG PO TABS: 2.5000 mg | ORAL_TABLET | Freq: Four times a day (QID) | ORAL | Status: DC | PRN

## 2014-12-29 MED ORDER — RIFAXIMIN 550 MG PO TABS: 550.0000 mg | ORAL_TABLET | Freq: Two times a day (BID) | ORAL | Status: AC

## 2014-12-29 MED ORDER — THIAMINE HCL 100 MG PO TABS: 100.0000 mg | ORAL_TABLET | Freq: Every day | ORAL | Status: DC

## 2014-12-29 MED ORDER — FENTANYL 25 MCG/HR TD PT72: 25.0000 ug | MEDICATED_PATCH | TRANSDERMAL | Status: DC

## 2014-12-29 MED ORDER — LACTULOSE 10 GM/15ML PO SOLN: 15.0000 g | Freq: Three times a day (TID) | ORAL | Status: DC

## 2014-12-29 MED ORDER — LIDOCAINE 5 % EX PTCH: 1.0000 | MEDICATED_PATCH | CUTANEOUS | Status: DC

## 2014-12-29 MED ORDER — CIPROFLOXACIN HCL 500 MG PO TABS: 500.0000 mg | ORAL_TABLET | Freq: Two times a day (BID) | ORAL | Status: DC

## 2014-12-29 NOTE — Progress Notes (Signed)
Physical Therapy Treatment Patient Details Name: Allen Schroeder MRN: [id number] DOB: 06-22-1951 Today's Date: 12/29/2014    History of Present Illness Allen Schroeder is a 64 y.o. male with cirrhosis of the liver with alcohol abuse, atrial fibrillation, metastatic prostate cancer, fall in April 2016 with rib fxs and HTN presents to the ED with altered mental status.     PT Comments    Spouse and family present during session.  Pt dismissed them from the room.  Assisted OOB to amb twice in hallway using RW.  Very unsteady/ataxic gait. Advised spouse to always have hands on assist when pt is amb.  HIGH FALL RISK.   Follow Up Recommendations  SNF (pt/spouse declining SNF rec and now plan to go home)     Equipment Recommendations   (has a walker)    Recommendations for Other Services       Precautions / Restrictions Precautions Precautions: Fall Restrictions Weight Bearing Restrictions: No    Mobility  Bed Mobility Overal bed mobility: Needs Assistance Bed Mobility: Supine to Sit     Supine to sit: Supervision     General bed mobility comments: increased time and repeat VC's to stay on task  Transfers Overall transfer level: Needs assistance Equipment used: Rolling walker (2 wheeled) Transfers: Sit to/from Stand Sit to Stand: +2 safety/equipment;Min assist         General transfer comment: 75% VC's on proper hand placement and increased time  Ambulation/Gait Ambulation/Gait assistance: +2 safety/equipment;Min assist Ambulation Distance (Feet): 175 Feet Assistive device: Rolling walker (2 wheeled) Gait Pattern/deviations: Step-through pattern;Decreased stride length;Trunk flexed;Narrow base of support Gait velocity: decreased   General Gait Details: very unsteady/ataxic gait with poor forward flex posture.  HIGH FALL RISK.    Stairs            Wheelchair Mobility    Modified Rankin (Stroke Patients Only)       Balance                                     Cognition Arousal/Alertness: Awake/alert Behavior During Therapy: WFL for tasks assessed/performed Overall Cognitive Status: Within Functional Limits for tasks assessed Area of Impairment: Memory                    Exercises      General Comments        Pertinent Vitals/Pain Pain Assessment: Faces Faces Pain Scale: Hurts a little bit Pain Location: R ribs Pain Descriptors / Indicators: Sore;Tender Pain Intervention(s): Monitored during session;Repositioned    Home Living                      Prior Function            PT Goals (current goals can now be found in the care plan section) Progress towards PT goals: Progressing toward goals    Frequency  Min 3X/week    PT Plan      Co-evaluation             End of Session Equipment Utilized During Treatment: Gait belt Activity Tolerance: Treatment limited secondary to medical complications (Comment) (ataxic gait)       Time: 1310-1335 PT Time Calculation (min) (ACUTE ONLY): 25 min  Charges:  $Gait Training: 8-22 mins $Therapeutic Activity: 8-22 mins  G Codes:      Rica Koyanagi  PTA [facility]  Acute  Rehab Pager      [phone]

## 2014-12-29 NOTE — Discharge Summary (Addendum)
Physician Discharge Summary  Allen Schroeder SPQ:[id number] DOB: 1951/06/22 DOA: 12/21/2014  PCP: [name], MD  Admit date: 12/21/2014 Discharge date: 12/29/2014  Time spent: Greater than 30 minutes  Recommendations for Outpatient Follow-up:  1. Dr. [name], PCP: Patient has prior appointment on 01/07/15 at 4:15 PM. To be seen with repeat labs (CBC, CMP & pneumonia). Needs to start antibiotic prophylaxis for SBP during that visit. 2. Dr. Silvano Rusk, GI 3. Dr. Zola Button, Oncology 4. Home health RN and PT. Patient has private sitters at home.  Discharge Diagnoses:  Active Problems:   Essential hypertension   Atrial fibrillation   Ascites   Altered mental status   Cirrhosis with alcoholism   Hyponatremia   Altered mental state   Acute encephalopathy   Sepsis   Bacteremia, escherichia coli   Encephalopathy, hepatic   Palliative care encounter   Right-sided low back pain without sciatica   Hypokalemia   Discharge Condition: Improved & Stable  Diet recommendation: Heart healthy diet.  Filed Weights   12/27/14 0541 12/28/14 0451 12/29/14 0524  Weight: 94.1 kg (207 lb 7.3 oz) 93.8 kg (206 lb 12.7 oz) 93.4 kg (205 lb 14.6 oz)    History of present illness:  64 y.o. 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 [name]), was on Xtandi which was stopped because patient developed jaundiced although he has had an excellent response to Xtandi at least in regards to monitored PSA values. Of note, last CT scan in 11/2014 showed sclerotic lesions from metastatic disease and new sclerotic lesion in L2 area. Further history includes CAD, atrial fibrillation, alcohol abuse, hypertension, recent hospitalization in 11/2014 for fall at which time he sustained rib fractures.  Patient presented from home to [facility] with worsening mental status changes, confusion and fevers. On admission, blood pressure  was 77/44, HR 98, RR 16 - 30, T max 102.9 F and oxygen saturation as low as 89% which has improved with [place] oxygen support. Blood work was notable for leukocytosis of 14.9, hemoglobin 10.8, sodium 127, normal creatinine, INR 1.57. Lactic acid was 2.41 and procalcitonin 0.95. Ammonia level was about 40. Chest x-ray showed low lung volumes with bibasilar atelectasis.   He was initially admitted to stepdown unit for management of sepsis. After stabilization, he was transferred to medical bed.   Hospital Course:   Principal problem: Sepsis secondary to Escherichia coli bacteremia - Sepsis criteria met on the admission with hypotension, tachycardia, tachypnea, fever, hypoxia. Lactic acid was 2.41 and procalcitonin 0.95.  - Source: Possibly from GI translocation. Urine culture negative. C. difficile PCR negative. Not much ascitic fluid and no symptoms to suggest SBP.  - Patient was admitted to SDU and started on broad-spectrum antibiotics, vancomycin and Zosyn. Vancomycin discontinued. - Clinically improved - Escherichia coli is pansensitive: Changed IV Zosyn to Rocephin and eventually changed to PO Cipro 5/21 and complete total 14 days treatment (last day of antibiotics 01/03/15). Following treatment for bacteremia, as per GI recommendations-keep on Bactrim prophylaxis for SBP-to be started by PCP during outpatient follow-up.   Active Problem: Acute respiratory failure with hypoxia - Likely secondary to atelectasis in the context of acute hepatic encephalopathy. - Low index of suspicion for pneumonia. - Chest x-ray on the admission did not reveal acute cardiopulmonary findings other than bibasilar atelectasis. - Resolved  History of prostate cancer with bone metastasis - Follows with Dr. [name] on oncology - Last CT abdomen/pelvis in April  2016 with sclerotic lesions consistent with metastasis and there was a new sclerotic lesion in L2 area. 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 and transfuse for hemoglobin < 7 g per DL. - Drop in hemoglobin from 10.8 > 8.1 may be related to critical illness and hemodilution. No reported bleeding. - stable  Hypokalemia - Replaced  Hyponatremia - Likely related to liver cirrhosis. - Improved/stable  Alcoholic cirrhosis with coagulopathy - Abdominal US on this admission showed questionable minimal ascites. - GI input 5/19 appreciated: Child Pugh score 12 which gives him a class of C and life expectancy of 1-3 years. Believes Xtandi contributed to rising bilirubin. GI states that it is possible he could improve and may reconsider decisions regarding cancer treatment. - Low-dose diuretics which were temporarily held during course of hospitalization, will be resumed at discharge.  Paroxysmal Atrial fibrillation - CHADS vasc score at least 3 - Sotalol on hold due to hypotension/soft BP's - Continue Plavix daily  - Not on other anticoagulation due to risk of falls and alcohol abuse - If blood pressures improved during outpatient follow-up, may consider resuming beta blockers. - Has remained in sinus rhythm.   Acute hepatic encephalopathy - Secondary to acute illness complicating alcoholic cirrhosis. Ammonia level is around 40 on this admission. - Continue lactulose and rifaximin added - Minimize opioids and sedative medications - rectal tube removed 5/19. - Adjusted lactulose dose and having appropriate BMs. - Continues to be intermittently mildly confused-? Now at baseline. - Had CT head 4/28 without acute findings. Repeat CT head 5/23: No acute findings  Right-sided pleuritic/musculoskeletal chest pain/low back pain related to metastases - Related to prior rib fractures. - Chest x-ray 5/16 showed bibasilar atelectasis but could not see previous rib fractures - Pain  management: Minimize opioids.  - Added lidocaine patch with better pain control. - Palliative care team adjusting pain regimen. - Pain seems to be reasonably controlled. Further adjustments can be made during outpatient follow-up.  Failure to thrive/severe deconditioning - Secondary to alcoholic cirrhosis and metastatic prostate cancer. - Overall prognosis from liver cirrhosis is poor.  - Patient keen to go on hospice.  - palliative care input/recommendations appreciated-patient has repeatedly expressed desire for hospice care. However family not in agreement with his wishes. Spouse has again declined home hospice. - Family wishes for patient to go home with home health RN, PT and have arranged for 24/7 supervision including private help.  History of alcohol abuse - As per family, patient has not had alcohol intake since 11/17/14 - No overt withdrawal features. - Continue thiamine, folate and multivitamins.  Nonsustained SVT and? VT - Seen on monitor. Asymptomatic. - Check 2-D echo: Normal EF - TSH recently normal. - Unable to resume beta blocker secondary to soft blood pressures  - Attempt to keep potassium >4 and magnesium >2  Consultants:  CCM-signed off 5/17  Palliative care team  [facility]   Oncology   Procedures:  Rectal tube-DC'd  Condom cath  Antibiotics:  IV cefepime 1 dose on 5/16  IV Zosyn 5/16 > 5/19  IV vancomycin 2 doses on 5/16  Rifaximin 5/17>  IV Rocephin 5/19 > 5/21   oral Cipro 5/21 > 5/29   Discharge Exam:  Complaints: Denies complaints. Pain appears to be better controlled. As per caregiver at bedside, patient has done quite well all day yesterday and  today. No acute events reported by spouse or nursing.  Filed Vitals:   12/28/14 1607 12/28/14 1608 12/28/14 2114 12/29/14 0524  BP: 105/61 108/64 107/60 102/60  Pulse: 85 97 71 72  Temp:   98.3 F (36.8 C) 98.2 F (36.8 C)  TempSrc:   Oral Oral  Resp:   16 16  Height:       Weight:    93.4 kg (205 lb 14.6 oz)  SpO2:   95% 94%    General exam: Moderately built and nourished middle-aged chronically ill-looking male sitting up comfortably in bed without distress. Respiratory system: clear to auscultation. No increased work of breathing. Cardiovascular system: S1 & S2 heard, RRR. No JVD, murmurs, gallops, clicks. Trace pedal edema.  Gastrointestinal system: Abdomen is mildly distended, soft and nontender. Normal bowel sounds heard. Central nervous system: Alert and oriented 2 . No focal neurological deficits. asterixis + (seems less).  Extremities: Symmetric 5 x 5 power.  Discharge Instructions      Discharge Instructions    Call MD for:  difficulty breathing, headache or visual disturbances    Complete by:  As directed      Call MD for:  extreme fatigue    Complete by:  As directed      Call MD for:  persistant dizziness or light-headedness    Complete by:  As directed      Call MD for:  persistant nausea and vomiting    Complete by:  As directed      Call MD for:  severe uncontrolled pain    Complete by:  As directed      Call MD for:  temperature >100.4    Complete by:  As directed      Call MD for:    Complete by:  As directed   Worsening confusion or altered mental status.     Diet - low sodium heart healthy    Complete by:  As directed      Increase activity slowly    Complete by:  As directed             Medication List    STOP taking these medications        chlordiazePOXIDE 25 MG capsule  Commonly known as:  LIBRIUM     potassium chloride SA 20 MEQ tablet  Commonly known as:  K-DUR,KLOR-CON     sotalol 120 MG tablet  Commonly known as:  BETAPACE     sotalol 80 MG tablet  Commonly known as:  BETAPACE      TAKE these medications        CALCIUM CARB-CHOLECALCIFEROL PO  Take 1 capsule by mouth 2 (two) times daily.     ciprofloxacin 500 MG tablet  Commonly known as:  CIPRO  Take 1 tablet (500 mg total) by mouth 2 (two)  times daily.     clopidogrel 75 MG tablet  Commonly known as:  PLAVIX  TAKE ONE TABLET BY MOUTH ONCE DAILY     fentaNYL 25 MCG/HR patch  Commonly known as:  DURAGESIC - dosed mcg/hr  Place 1 patch (25 mcg total) onto the skin every 3 (three) days.     folic acid 1 MG tablet  Commonly known as:  FOLVITE  Take one tablet by mouth once a day....     furosemide 20 MG tablet  Commonly known as:  LASIX  Take 0.5 tablets (10 mg total) by mouth daily.     lactulose 10 GM/15ML solution  Commonly known as:  CHRONULAC  Take 22.5 mLs (15 g total) by mouth 3 (three) times daily.     lidocaine 5 %  Commonly known as:  LIDODERM  - Place 1 patch onto the skin daily. Remove & Discard patch within 12 hours or as directed by MD.  - Apply to lower back.     multivitamin tablet  Take 1 tablet by mouth daily.     oxyCODONE 5 MG immediate release tablet  Commonly known as:  Oxy IR/ROXICODONE  Take 0.5-1 tablets (2.5-5 mg total) by mouth every 6 (six) hours as needed for moderate pain or severe pain.     rifaximin 550 MG Tabs tablet  Commonly known as:  XIFAXAN  Take 1 tablet (550 mg total) by mouth 2 (two) times daily.     spironolactone 25 MG tablet  Commonly known as:  ALDACTONE  Take 1 tablet (25 mg total) by mouth daily.     thiamine 100 MG tablet  Take 1 tablet (100 mg total) by mouth daily.     UNKNOWN TO PATIENT  Slow release hormone implant placed 8/13 by Dr. Willette Cluster.  Good for one year       Follow-up Information    Follow up with [name], MD On 01/07/2015.   Specialty:  Internal Medicine   Why:  4:15PM. Has previous appointment. To be seen with repeat labs (CBC, CMP & ammonia).   Contact information:   [address] [place] [address] [phone]       Schedule an appointment as soon as possible for a visit with Silvano Rusk, MD.   Specialty:  Gastroenterology   Contact information:   [address] [place] [address] [phone]        Schedule an appointment as soon as possible for a visit with [facility], MD.   Specialty:  Oncology   Contact information:   [address] [place] [id number] [phone]        The results of significant diagnostics from this hospitalization (including imaging, microbiology, ancillary and laboratory) are listed below for reference.    Significant Diagnostic Studies: Dg Ribs Unilateral W/chest Right  12/03/2014   CLINICAL DATA:  [place] at the nursing home wall transferring from bed to wheelchair, striking the back of the head. Patient has fallen 3-4 times in the past week. Mid to lower right chest/rib pain. Initial encounter.  EXAM: RIGHT RIBS AND CHEST - 3+ VIEW  COMPARISON:  No prior rib imaging. Chest x-ray 11/22/2014 and earlier. Whole-body bone scan 08/14/2012, 02/19/2012.  FINDINGS: Comminuted, mildly displaced fractures involving the right posterior tenth and eleventh ribs. Nondisplaced fracture involving the posterolateral eleventh rib.  Atelectasis involving the right lower lobe, superimposed on chronic elevation of the right hemidiaphragm. Lungs otherwise clear. No localized airspace consolidation. No pleural effusions. No pneumothorax. Normal pulmonary vascularity. Cardiac silhouette mildly enlarged for AP technique, unchanged.  IMPRESSION: 1. Acute traumatic fractures involving the right posterior tenth, eleventh and twelfth ribs. The tenth and eleventh rib fractures are mildly displaced, the 12 rib fracture is nondisplaced. 2. Right lower lobe atelectasis. No acute cardiopulmonary disease otherwise. Stable mild cardiomegaly.   Electronically Signed   By: Evangeline Dakin M.D.   On: 12/03/2014 19:16   Ct Head Wo Contrast  12/28/2014   CLINICAL DATA:  Increased confusion. Current history of metastatic prostate cancer.  EXAM: CT HEAD WITHOUT CONTRAST  TECHNIQUE: Contiguous axial images were obtained from the base of the skull through the  vertex without intravenous contrast.   COMPARISON:  CT scan of December 03, 2014.  FINDINGS: Bony calvarium appears intact. Mild diffuse cortical atrophy is noted. No mass effect or midline shift is noted. Ventricular size is within normal limits. There is no evidence of mass lesion, hemorrhage or acute infarction.  IMPRESSION: Mild diffuse cortical atrophy. No acute intracranial abnormality seen.   Electronically Signed   By: Marijo Conception, M.D.   On: 12/28/2014 16:38   Ct Head Wo Contrast  12/03/2014   CLINICAL DATA:  Fall transferring from bed to wheelchair. Patient struck back of head on bed radially. History of alcoholism. Prior history of prostate cancer.  EXAM: CT HEAD WITHOUT CONTRAST  CT CERVICAL SPINE WITHOUT CONTRAST  TECHNIQUE: Multidetector CT imaging of the head and cervical spine was performed following the standard protocol without intravenous contrast. Multiplanar CT image reconstructions of the cervical spine were also generated.  COMPARISON:  Head CT and neck CT 11/30/2007  FINDINGS: CT HEAD FINDINGS  No intracranial hemorrhage. No parenchymal contusion. No midline shift or mass effect. Basilar cisterns are patent. No skull base fracture. No fluid in the paranasal sinuses or mastoid air cells. Orbits are normal. There is generalized cortical atrophy and proportional ventricular dilatation. Mild periventricular white matter hypodensities.  CT CERVICAL SPINE FINDINGS  No prevertebral soft tissue swelling. Normal alignment of cervical vertebral bodies. No loss of vertebral body height. Normal facet articulation. Normal craniocervical junction.  No evidence epidural or paraspinal hematoma.  There is multiple levels of endplate spurring most severe from C5-C7. There is joint space narrowing at C6-C7.  There is a new sclerotic lesion within the pars interarticularis of the C5 vertebral body on the left. There is a rounded lesion within the T1 vertebral body which is new also (image 80, series 6) and measures 10 mm.  IMPRESSION: 1. No acute  intracranial findings. 2. Cortical atrophy and ventricular dilatation similar prior. 3. No cervical spine fracture. 4. Sclerotic skeletal metastasis at C5 and T1 are new from CT of 11/30/2007.   Electronically Signed   By: Suzy Bouchard M.D.   On: 12/03/2014 21:03   Ct Cervical Spine Wo Contrast  12/03/2014   CLINICAL DATA:  Fall transferring from bed to wheelchair. Patient struck back of head on bed radially. History of alcoholism. Prior history of prostate cancer.  EXAM: CT HEAD WITHOUT CONTRAST  CT CERVICAL SPINE WITHOUT CONTRAST  TECHNIQUE: Multidetector CT imaging of the head and cervical spine was performed following the standard protocol without intravenous contrast. Multiplanar CT image reconstructions of the cervical spine were also generated.  COMPARISON:  Head CT and neck CT 11/30/2007  FINDINGS: CT HEAD FINDINGS  No intracranial hemorrhage. No parenchymal contusion. No midline shift or mass effect. Basilar cisterns are patent. No skull base fracture. No fluid in the paranasal sinuses or mastoid air cells. Orbits are normal. There is generalized cortical atrophy and proportional ventricular dilatation. Mild periventricular white matter hypodensities.  CT CERVICAL SPINE FINDINGS  No prevertebral soft tissue swelling. Normal alignment of cervical vertebral bodies. No loss of vertebral body height. Normal facet articulation. Normal craniocervical junction.  No evidence epidural or paraspinal hematoma.  There is multiple levels of endplate spurring most severe from C5-C7. There is joint space narrowing at C6-C7.  There is a new sclerotic lesion within the pars interarticularis of the C5 vertebral body on the left. There is a rounded lesion within the T1 vertebral body which is new also (image 80, series 6) and  measures 10 mm.  IMPRESSION: 1. No acute intracranial findings. 2. Cortical atrophy and ventricular dilatation similar prior. 3. No cervical spine fracture. 4. Sclerotic skeletal metastasis at C5  and T1 are new from CT of 11/30/2007.   Electronically Signed   By: Suzy Bouchard M.D.   On: 12/03/2014 21:03   US Abdomen Limited  12/22/2014   CLINICAL DATA:  Past history of alcoholic hepatitis. Followup minimal ascites to determine if more has accumulated.  EXAM: LIMITED ABDOMEN ULTRASOUND FOR ASCITES  TECHNIQUE: Limited ultrasound survey for ascites was performed in all four abdominal quadrants.  COMPARISON:  11/23/2014.  FINDINGS: On one of the images, there is questionable minimal ascites adjacent to the liver. Otherwise, no free peritoneal fluid is seen.  IMPRESSION: Questionable minimal ascites, significantly decreased.   Electronically Signed   By: Claudie Revering M.D.   On: 12/22/2014 09:42   Dg Chest Port 1 View  12/21/2014   CLINICAL DATA:  Altered mental status.  Fever.  EXAM: PORTABLE CHEST - 1 VIEW  COMPARISON:  12/03/2014  FINDINGS: Mild cardiomegaly. Low lung volumes with bibasilar opacities likely atelectasis. No effusions or pneumothorax. Previously seen right rib fractures not visible on today's study.  IMPRESSION: Low lung volumes with bibasilar atelectasis.   Electronically Signed   By: Rolm Baptise M.D.   On: 12/21/2014 16:53    Microbiology: Recent Results (from the past 240 hour(s))  Blood culture (routine x 2)     Status: None   Collection Time: 12/21/14  4:48 PM  Result Value Ref Range Status   Specimen Description BLOOD RAC  Final   Special Requests BOTTLES DRAWN AEROBIC AND ANAEROBIC 5CC  Final   Culture   Final    ESCHERICHIA COLI Note: SUSCEPTIBILITIES PERFORMED ON PREVIOUS CULTURE WITHIN THE LAST 5 DAYS. Note: Gram Stain Report Called to,Read Back By and Verified With: TAMMY PENNINGTON 12/22/14 @ 0950 BY [place] Performed at [facility]    Report Status 12/24/2014 FINAL  Final  Blood culture (routine x 2)     Status: None   Collection Time: 12/21/14  4:48 PM  Result Value Ref Range Status   Specimen Description BLOOD LAC  Final   Special Requests  BOTTLES DRAWN AEROBIC AND ANAEROBIC 5CC  Final   Culture   Final    ESCHERICHIA COLI Note: Gram Stain Report Called to,Read Back By and Verified With: TAMMY PENNINGTON [id number] ON [id number] BY [place] Performed at [facility]    Report Status 12/24/2014 FINAL  Final   Organism ID, Bacteria ESCHERICHIA COLI  Final      Susceptibility   Escherichia coli - MIC*    AMPICILLIN <=2 SENSITIVE Sensitive     AMPICILLIN/SULBACTAM <=2 SENSITIVE Sensitive     CEFAZOLIN <=4 SENSITIVE Sensitive     CEFEPIME <=1 SENSITIVE Sensitive     CEFTAZIDIME <=1 SENSITIVE Sensitive     CEFTRIAXONE <=1 SENSITIVE Sensitive     CIPROFLOXACIN <=0.25 SENSITIVE Sensitive     GENTAMICIN <=1 SENSITIVE Sensitive     IMIPENEM <=0.25 SENSITIVE Sensitive     PIP/TAZO <=4 SENSITIVE Sensitive     TOBRAMYCIN <=1 SENSITIVE Sensitive     TRIMETH/SULFA <=20 SENSITIVE Sensitive     * ESCHERICHIA COLI  Urine culture     Status: None   Collection Time: 12/21/14  6:16 PM  Result Value Ref Range Status   Specimen Description URINE, CATHETERIZED  Final   Special Requests Immunocompromised  Final   Colony Count NO GROWTH Performed  at [facility]   Final   Culture NO GROWTH Performed at [facility]   Final   Report Status 12/22/2014 FINAL  Final  Culture, blood (x 2)     Status: None   Collection Time: 12/21/14 10:08 PM  Result Value Ref Range Status   Specimen Description BLOOD RHAND  Final   Special Requests BOTTLES DRAWN AEROBIC AND ANAEROBIC 5CC  Final   Culture   Final    NO GROWTH 5 DAYS Performed at [facility]    Report Status 12/28/2014 FINAL  Final  Culture, blood (x 2)     Status: None   Collection Time: 12/21/14 10:27 PM  Result Value Ref Range Status   Specimen Description BLOOD LHAND  Final   Special Requests BOTTLES DRAWN AEROBIC ONLY 1CC  Final   Culture   Final    NO GROWTH 5 DAYS Note: Culture results may be compromised due to an inadequate volume of blood received in  culture bottles. Performed at [facility]    Report Status 12/28/2014 FINAL  Final  MRSA PCR Screening     Status: None   Collection Time: 12/21/14 11:08 PM  Result Value Ref Range Status   MRSA by PCR NEGATIVE NEGATIVE Final    Comment:        The GeneXpert MRSA Assay (FDA approved for NASAL specimens only), is one component of a comprehensive MRSA colonization surveillance program. It is not intended to diagnose MRSA infection nor to guide or monitor treatment for MRSA infections.   Clostridium Difficile by PCR     Status: None   Collection Time: 12/21/14 11:58 PM  Result Value Ref Range Status   C difficile by pcr NEGATIVE NEGATIVE Final     Labs: Basic Metabolic Panel:  Recent Labs Lab 12/23/14 0312 12/24/14 0510 12/25/14 0325 12/25/14 1815 12/26/14 0527 12/27/14 0527  NA 135 132* 132*  --  133* 133*  K 3.7 3.7 3.5  --  3.4* 3.9  CL 105 103 101  --  101 102  CO2 _0 --  26 25  GLUCOSE 92 81 87  --  84 82  BUN _1 --  7 8  CREATININE 0.97 0.83 0.77  --  0.65 0.71  CALCIUM 7.2* 7.4* 7.2*  --  7.6* 7.6*  MG  --   --   --  2.0  --   --    Liver Function Tests:  Recent Labs Lab 12/24/14 0510 12/25/14 0325 12/26/14 0527  AST 74* 81* 83*  ALT 34 32 34  ALKPHOS 105 118 127*  BILITOT 3.2* 2.6* 2.7*  PROT 5.9* 6.1* 6.1*  ALBUMIN 1.9* 2.0* 2.0*   No results for input(s): LIPASE, AMYLASE in the last 168 hours.  Recent Labs Lab 12/24/14 0510  AMMONIA 33   CBC:  Recent Labs Lab 12/23/14 1700 12/24/14 0510 12/25/14 0325 12/26/14 0527  WBC 7.8 7.0 8.1 7.8  HGB 9.3* 8.9* 8.8* 9.1*  HCT 27.6* 26.4* 25.1* 27.9*  MCV 120.5* 120.0* 118.4* 118.2*  PLT 153 140* 139* 150   Cardiac Enzymes: No results for input(s): CKTOTAL, CKMB, CKMBINDEX, TROPONINI in the last 168 hours. BNP: BNP (last 3 results) No results for input(s): BNP in the last 8760 hours.  ProBNP (last 3 results) No results for input(s): PROBNP in the last 8760  hours.  CBG:  Recent Labs Lab 12/23/14 0745  GLUCAP 89     Discussed in detail  with patient's spouse at bedside. Updated care and answered questions. Discussed with care management and patient's nursing Discussed with PCP: Updated Hospital course, ongoing care, discharge plans and recommendations for outpatient follow-up.  Signed:  Vernell Leep, MD, FACP, FHM. [facility] Pager [phone]  If 7PM-7AM, please contact night-coverage [email or web address] Password [email or web address] 12/29/2014, 1:16 PM

## 2014-12-29 NOTE — Care Management Note (Signed)
Case Management Note  Patient Details  Name: Allen Schroeder MRN: [id number] Date of Birth: 1951-02-14  Subjective/Objective:                    Action/Plan:   Expected Discharge Date:   (unknown)               Expected Discharge Plan:  [place]  In-House Referral:  Clinical Social Work  Discharge planning Services  CM Consult  Post Acute Care Choice:  NA Choice offered to:  NA  DME Arranged:    DME Agency:     HH Arranged:  RN, NA [place] Agency:  [place]  Status of Service:  In process, will continue to follow  Medicare Important Message Given:  Yes Date Medicare IM Given:  12/27/14 Medicare IM give by:    Date Additional Medicare IM Given:    Additional Medicare Important Message give by:     If discussed at [place] of Stay Meetings, dates discussed:    Additional CommentsPurcell Mouton, RN 12/29/2014, 3:28 PM

## 2014-12-29 NOTE — Progress Notes (Signed)
Follow up palliative care visit for pain management.  Interval: 1. Mrs. Nessler declined Hospice evaluation despite the patient's wish. He will be home with [facility] and sitters 24/7 2. Pain - good control at this time on low dose fentanyl patch, Oxy IR for break-through and lidocaine patch over ribs.  Reviewed all pain meds with Mrs. Hollinsworth. Gave clear instructions to call Dr. Letta Median for any questions and especially for any dose adjustment.  Duration of pain treatment dependent on rib fracture healing and whether pain is originating from bone mets. Mrs. Hustead states that have seen [facility] for consultation who advised waiting before considering treatment until there is intractable pain.  Answered all other questions.  Time in  0910 Time out 0929  Greater than 50% of visit on education and counseling  Adella Hare, MD Palliative Care Team

## 2014-12-29 NOTE — Clinical Social Work Placement (Signed)
   CLINICAL SOCIAL WORK PLACEMENT  NOTE  Date:  12/29/2014  Patient Details  Name: Allen Schroeder MRN: [id number] Date of Birth: May 30, 1951  Clinical Social Work is seeking post-discharge placement for this patient at the   level of care (*CSW will initial, date and re-position this form in  chart as items are completed):  Yes   Patient/family provided with [place] Work Department's list of facilities offering this level of care within the geographic area requested by the patient (or if unable, by the patient's family).  Yes   Patient/family informed of their freedom to choose among providers that offer the needed level of care, that participate in Medicare, Medicaid or managed care program needed by the patient, have an available bed and are willing to accept the patient.  Yes   Patient/family informed of [place]'s ownership interest in [facility] and [facility], as well as of the fact that they are under no obligation to receive care at these facilities.  PASRR submitted to EDS on       PASRR number received on       Existing PASRR number confirmed on 12/26/14     FL2 transmitted to all facilities in geographic area requested by pt/family on 12/26/14     FL2 transmitted to all facilities within larger geographic area on       Patient informed that his/her managed care company has contracts with or will negotiate with certain facilities, including the following:        No (pt wife was only interested in knowing responses from [place], [place], and [place] of which offered pt a bed)   Patient/family informed of bed offers received.  Patient chooses bed at  (home with home health and 24 hour care)     Physician recommends and patient chooses bed at      Patient to be transferred to Other - please specify in the comment section below: (home with home health services and 24 hour caregivers) on 12/29/14.  Patient to be transferred to  facility by ambulance [facility]) transport home     Patient family notified on 12/29/14 of transfer.  Name of family member notified:  pt and pt wife notified at bedside     PHYSICIAN       Additional Comment:    _______________________________________________ Ladell Pier, LCSW 12/29/2014, 2:08 PM

## 2014-12-29 NOTE — Progress Notes (Signed)
Pt for discharge home with home health services and 24 hour caregivers.   CSW met with pt wife in hallway today while pt resting and pt wife expressed that she feels that home will be best plan for pt as pt had negative experience in the past at SNF. CSW updated pt wife that the facilities pt wife had mentioned [place], [place], [place] [facility] [place], that none of those facilities were able to offer a bed from SNF search initiated in the hospital. Pt wife expressed understanding. CSW updated pt wife that if she wishes to look into SNF from home for pt then pt wife needs to involve home health social worker. Pt wife is hopeful that plan for home with home health and 24 hour caregivers will go smoothly and pt and pt family will not have to be worried about exploring SNF options from home.    Pt wife requesting ambulance transport to home. CSW discussed with pt wife that CSW could not guarantee that ambulance transport will be covered by insurance, but CSW will complete a medical necessity form to provide to the ambulance company and the [employer] submits medical necessity form to pt insurance company to determine coverage. Pt wife expressed understanding.   CSW confirmed address and gathered needed documents for ambulance transport.  CSW spoke with RN and arranged ambulance transport home for pt.   No further social work needs identified at this time.  CSW signing off.   Alison [place], MSW, [place] Work [phone]

## 2014-12-29 NOTE — Care Management Note (Signed)
Case Management Note  Patient Details  Name: TKAI SERFASS MRN: [id number] Date of Birth: [birth year]  Subjective/Objective:    Pt admitted with AMS                Action/Plan:from home with 24 hr care givers and [facility]   Expected Discharge Date:   (unknown)               Expected Discharge Plan:  [place]  In-House Referral:  Clinical Social Work  Discharge planning Services  CM Consult  Post Acute Care Choice:  NA Choice offered to:  NA  DME Arranged:    DME Agency:     HH Arranged:  RN, NA [place] Agency:  [place]  Status of Service:  In process, will continue to follow  Medicare Important Message Given:  Yes Date Medicare IM Given:  12/27/14 Medicare IM give by:    Date Additional Medicare IM Given:    Additional Medicare Important Message give by:     If discussed at [place] of Stay Meetings, dates discussed:    Additional CommentsPurcell Mouton, RN 12/29/2014, 1:54 PM

## 2014-12-30 ENCOUNTER — Telehealth: Payer: Self-pay | Admitting: Internal Medicine

## 2014-12-30 ENCOUNTER — Encounter (HOSPITAL_COMMUNITY): Payer: Self-pay

## 2014-12-30 ENCOUNTER — Other Ambulatory Visit: Payer: Medicare Other

## 2014-12-30 ENCOUNTER — Ambulatory Visit: Payer: Medicare Other | Admitting: Oncology

## 2014-12-30 DIAGNOSIS — S2241XD Multiple fractures of ribs, right side, subsequent encounter for fracture with routine healing: Secondary | ICD-10-CM | POA: Diagnosis not present

## 2014-12-30 DIAGNOSIS — I10 Essential (primary) hypertension: Secondary | ICD-10-CM | POA: Diagnosis not present

## 2014-12-30 DIAGNOSIS — R269 Unspecified abnormalities of gait and mobility: Secondary | ICD-10-CM | POA: Diagnosis not present

## 2014-12-30 DIAGNOSIS — I480 Paroxysmal atrial fibrillation: Secondary | ICD-10-CM | POA: Diagnosis not present

## 2014-12-30 DIAGNOSIS — K7011 Alcoholic hepatitis with ascites: Secondary | ICD-10-CM | POA: Diagnosis not present

## 2014-12-30 DIAGNOSIS — W19XXXD Unspecified fall, subsequent encounter: Secondary | ICD-10-CM | POA: Diagnosis not present

## 2014-12-30 NOTE — Telephone Encounter (Signed)
I explained to the patient's wife that there are no openings with Dr. Carlean Purl until mid July and that he should see APP.  Patient is scheduled to see Tye Savoy RNP on 01/08/15 1:30

## 2015-01-01 ENCOUNTER — Encounter (HOSPITAL_COMMUNITY): Payer: Self-pay

## 2015-01-01 DIAGNOSIS — I480 Paroxysmal atrial fibrillation: Secondary | ICD-10-CM | POA: Diagnosis not present

## 2015-01-01 DIAGNOSIS — I10 Essential (primary) hypertension: Secondary | ICD-10-CM | POA: Diagnosis not present

## 2015-01-01 DIAGNOSIS — R269 Unspecified abnormalities of gait and mobility: Secondary | ICD-10-CM | POA: Diagnosis not present

## 2015-01-01 DIAGNOSIS — W19XXXD Unspecified fall, subsequent encounter: Secondary | ICD-10-CM | POA: Diagnosis not present

## 2015-01-01 DIAGNOSIS — K7011 Alcoholic hepatitis with ascites: Secondary | ICD-10-CM | POA: Diagnosis not present

## 2015-01-01 DIAGNOSIS — S2241XD Multiple fractures of ribs, right side, subsequent encounter for fracture with routine healing: Secondary | ICD-10-CM | POA: Diagnosis not present

## 2015-01-05 DIAGNOSIS — S2241XD Multiple fractures of ribs, right side, subsequent encounter for fracture with routine healing: Secondary | ICD-10-CM | POA: Diagnosis not present

## 2015-01-05 DIAGNOSIS — I480 Paroxysmal atrial fibrillation: Secondary | ICD-10-CM | POA: Diagnosis not present

## 2015-01-05 DIAGNOSIS — R269 Unspecified abnormalities of gait and mobility: Secondary | ICD-10-CM | POA: Diagnosis not present

## 2015-01-05 DIAGNOSIS — W19XXXD Unspecified fall, subsequent encounter: Secondary | ICD-10-CM | POA: Diagnosis not present

## 2015-01-05 DIAGNOSIS — K7011 Alcoholic hepatitis with ascites: Secondary | ICD-10-CM | POA: Diagnosis not present

## 2015-01-05 DIAGNOSIS — I10 Essential (primary) hypertension: Secondary | ICD-10-CM | POA: Diagnosis not present

## 2015-01-06 ENCOUNTER — Encounter (HOSPITAL_COMMUNITY): Payer: Self-pay

## 2015-01-06 DIAGNOSIS — I10 Essential (primary) hypertension: Secondary | ICD-10-CM | POA: Diagnosis not present

## 2015-01-06 DIAGNOSIS — R269 Unspecified abnormalities of gait and mobility: Secondary | ICD-10-CM | POA: Diagnosis not present

## 2015-01-06 DIAGNOSIS — K7011 Alcoholic hepatitis with ascites: Secondary | ICD-10-CM | POA: Diagnosis not present

## 2015-01-06 DIAGNOSIS — W19XXXD Unspecified fall, subsequent encounter: Secondary | ICD-10-CM | POA: Diagnosis not present

## 2015-01-06 DIAGNOSIS — S2241XD Multiple fractures of ribs, right side, subsequent encounter for fracture with routine healing: Secondary | ICD-10-CM | POA: Diagnosis not present

## 2015-01-06 DIAGNOSIS — I480 Paroxysmal atrial fibrillation: Secondary | ICD-10-CM | POA: Diagnosis not present

## 2015-01-07 DIAGNOSIS — E876 Hypokalemia: Secondary | ICD-10-CM | POA: Diagnosis not present

## 2015-01-07 DIAGNOSIS — G9340 Encephalopathy, unspecified: Secondary | ICD-10-CM | POA: Diagnosis not present

## 2015-01-07 DIAGNOSIS — I10 Essential (primary) hypertension: Secondary | ICD-10-CM | POA: Diagnosis not present

## 2015-01-07 DIAGNOSIS — K7030 Alcoholic cirrhosis of liver without ascites: Secondary | ICD-10-CM | POA: Diagnosis not present

## 2015-01-08 ENCOUNTER — Encounter (HOSPITAL_COMMUNITY): Payer: Self-pay

## 2015-01-08 ENCOUNTER — Ambulatory Visit (INDEPENDENT_AMBULATORY_CARE_PROVIDER_SITE_OTHER): Payer: Medicare Other | Admitting: Nurse Practitioner

## 2015-01-08 ENCOUNTER — Encounter: Payer: Self-pay | Admitting: Nurse Practitioner

## 2015-01-08 ENCOUNTER — Telehealth: Payer: Self-pay | Admitting: *Deleted

## 2015-01-08 VITALS — BP 94/54 | HR 86 | Ht 74.0 in | Wt 195.0 lb

## 2015-01-08 DIAGNOSIS — S2241XD Multiple fractures of ribs, right side, subsequent encounter for fracture with routine healing: Secondary | ICD-10-CM | POA: Diagnosis not present

## 2015-01-08 DIAGNOSIS — R269 Unspecified abnormalities of gait and mobility: Secondary | ICD-10-CM | POA: Diagnosis not present

## 2015-01-08 DIAGNOSIS — W19XXXD Unspecified fall, subsequent encounter: Secondary | ICD-10-CM | POA: Diagnosis not present

## 2015-01-08 DIAGNOSIS — K7031 Alcoholic cirrhosis of liver with ascites: Secondary | ICD-10-CM | POA: Diagnosis not present

## 2015-01-08 DIAGNOSIS — K7011 Alcoholic hepatitis with ascites: Secondary | ICD-10-CM | POA: Diagnosis not present

## 2015-01-08 DIAGNOSIS — I10 Essential (primary) hypertension: Secondary | ICD-10-CM | POA: Diagnosis not present

## 2015-01-08 DIAGNOSIS — I480 Paroxysmal atrial fibrillation: Secondary | ICD-10-CM | POA: Diagnosis not present

## 2015-01-08 NOTE — Patient Instructions (Signed)
Your follow up with Dr Carlean Purl is scheduled on 03/01/2015 Monday at 11am

## 2015-01-08 NOTE — Telephone Encounter (Signed)
INFORMED PT.'S WIFE THAT THE 12/30/14 APPOINTMENT HAS BEEN RESCHEDULED TO 01/26/15. SHE WAS GIVEN THE DATE AND TIMES.

## 2015-01-11 ENCOUNTER — Encounter (HOSPITAL_COMMUNITY): Payer: Self-pay

## 2015-01-11 DIAGNOSIS — I480 Paroxysmal atrial fibrillation: Secondary | ICD-10-CM | POA: Diagnosis not present

## 2015-01-11 DIAGNOSIS — S2241XD Multiple fractures of ribs, right side, subsequent encounter for fracture with routine healing: Secondary | ICD-10-CM | POA: Diagnosis not present

## 2015-01-11 DIAGNOSIS — R269 Unspecified abnormalities of gait and mobility: Secondary | ICD-10-CM | POA: Diagnosis not present

## 2015-01-11 DIAGNOSIS — K7011 Alcoholic hepatitis with ascites: Secondary | ICD-10-CM | POA: Diagnosis not present

## 2015-01-11 DIAGNOSIS — I10 Essential (primary) hypertension: Secondary | ICD-10-CM | POA: Diagnosis not present

## 2015-01-11 DIAGNOSIS — W19XXXD Unspecified fall, subsequent encounter: Secondary | ICD-10-CM | POA: Diagnosis not present

## 2015-01-12 DIAGNOSIS — W19XXXD Unspecified fall, subsequent encounter: Secondary | ICD-10-CM | POA: Diagnosis not present

## 2015-01-12 DIAGNOSIS — R269 Unspecified abnormalities of gait and mobility: Secondary | ICD-10-CM | POA: Diagnosis not present

## 2015-01-12 DIAGNOSIS — I10 Essential (primary) hypertension: Secondary | ICD-10-CM | POA: Diagnosis not present

## 2015-01-12 DIAGNOSIS — K7011 Alcoholic hepatitis with ascites: Secondary | ICD-10-CM | POA: Diagnosis not present

## 2015-01-12 DIAGNOSIS — I480 Paroxysmal atrial fibrillation: Secondary | ICD-10-CM | POA: Diagnosis not present

## 2015-01-12 DIAGNOSIS — S2241XD Multiple fractures of ribs, right side, subsequent encounter for fracture with routine healing: Secondary | ICD-10-CM | POA: Diagnosis not present

## 2015-01-12 NOTE — Progress Notes (Signed)
     History of Present Illness:   Patient is a 64 year old male known to Dr. Carlean Purl. He has a history of ETOH cirrhosis. He was back in the hospital recently, this time with sepsis / E.coli bacteremia. Source never identified, treated empirically for SBP. Will stay on Bactrim for SBP prophylaxis.  Patient here with wife and son. He feels relatlively well. Family hasn't noticed any behavioral changes or confusion. PCP cutting back lactulose to help with loose stools. Watching salt intake closely. Recent labs by PCP revealed normal renal function and bilirubin now under 3. Patient staying at home, has home health aides. No ETOH intake.   Patient has metastatic prostate cancer,  Xtandi on hold given recent liver decompensation. He is to see Oncology soon.   Dr. Carlean Purl spoke with family during patient's recent admission. His prognosis was discussed. Patient apparently interested in Hospice.     Current Medications, Allergies, Past Medical History, Past Surgical History, Family History and Social History were reviewed in [employer] record.   Physical Exam: General: Pleasant, white male in no acute distress using walker Head: Normocephalic and atraumatic Eyes:  sclerae anicteric, conjunctiva pink  Ears: Normal auditory acuity Lungs: Clear throughout to auscultation Heart: Regular rate and rhythm Abdomen: Limited exam, in chair. Abdomen soft, mildly distended, non-tender. No masses, no hepatomegaly. Normal bowel sounds Musculoskeletal: Symmetrical with no gross deformities  Extremities: 2+ RLE edema, 1+ LLE  Neurological: Alert oriented x 4, grossly nonfocal. No asterixis Psychological:  Alert and cooperative. Normal mood and affect  Assessment and Recommendations:  Pleasant 64 year old male with ETOH cirrhosis. Recently admitted with e.coli bacteremia / sepsis of undetermined source. Treated empirically for SBP and will remain on bactrim for SBP prophylaxis.  Patient looks and feels okay today. Watching sodium intake, compliant with diuretics. Recent renal function normal. Bili under 3 now. He isn't drinking ETOH.  Though ammonia 166 patient has no asterixis, his mentation is normal. Continue Xifaxan. Ok to cut back on Lactulose because of loose stool. Family knows to titrate dose to 2-3 loose BMs a day. PCP to draw labs again on 6/13, family will have them sent to [place]. Patient will follow up with Dr. Carlean Purl in 4-5 weeks, or sooner if needed.

## 2015-01-13 ENCOUNTER — Encounter (HOSPITAL_COMMUNITY): Payer: Self-pay

## 2015-01-13 DIAGNOSIS — S2241XD Multiple fractures of ribs, right side, subsequent encounter for fracture with routine healing: Secondary | ICD-10-CM | POA: Diagnosis not present

## 2015-01-13 DIAGNOSIS — W19XXXD Unspecified fall, subsequent encounter: Secondary | ICD-10-CM | POA: Diagnosis not present

## 2015-01-13 DIAGNOSIS — R269 Unspecified abnormalities of gait and mobility: Secondary | ICD-10-CM | POA: Diagnosis not present

## 2015-01-13 DIAGNOSIS — K7011 Alcoholic hepatitis with ascites: Secondary | ICD-10-CM | POA: Diagnosis not present

## 2015-01-13 DIAGNOSIS — I480 Paroxysmal atrial fibrillation: Secondary | ICD-10-CM | POA: Diagnosis not present

## 2015-01-13 DIAGNOSIS — I10 Essential (primary) hypertension: Secondary | ICD-10-CM | POA: Diagnosis not present

## 2015-01-15 ENCOUNTER — Encounter (HOSPITAL_COMMUNITY): Payer: Self-pay

## 2015-01-15 DIAGNOSIS — W19XXXD Unspecified fall, subsequent encounter: Secondary | ICD-10-CM | POA: Diagnosis not present

## 2015-01-15 DIAGNOSIS — K7011 Alcoholic hepatitis with ascites: Secondary | ICD-10-CM | POA: Diagnosis not present

## 2015-01-15 DIAGNOSIS — S2241XD Multiple fractures of ribs, right side, subsequent encounter for fracture with routine healing: Secondary | ICD-10-CM | POA: Diagnosis not present

## 2015-01-15 DIAGNOSIS — I480 Paroxysmal atrial fibrillation: Secondary | ICD-10-CM | POA: Diagnosis not present

## 2015-01-15 DIAGNOSIS — R269 Unspecified abnormalities of gait and mobility: Secondary | ICD-10-CM | POA: Diagnosis not present

## 2015-01-15 DIAGNOSIS — I10 Essential (primary) hypertension: Secondary | ICD-10-CM | POA: Diagnosis not present

## 2015-01-18 ENCOUNTER — Encounter (HOSPITAL_COMMUNITY): Payer: Self-pay

## 2015-01-18 DIAGNOSIS — I480 Paroxysmal atrial fibrillation: Secondary | ICD-10-CM | POA: Diagnosis not present

## 2015-01-18 DIAGNOSIS — K7011 Alcoholic hepatitis with ascites: Secondary | ICD-10-CM | POA: Diagnosis not present

## 2015-01-18 DIAGNOSIS — W19XXXD Unspecified fall, subsequent encounter: Secondary | ICD-10-CM | POA: Diagnosis not present

## 2015-01-18 DIAGNOSIS — I10 Essential (primary) hypertension: Secondary | ICD-10-CM | POA: Diagnosis not present

## 2015-01-18 DIAGNOSIS — S2241XD Multiple fractures of ribs, right side, subsequent encounter for fracture with routine healing: Secondary | ICD-10-CM | POA: Diagnosis not present

## 2015-01-18 DIAGNOSIS — R269 Unspecified abnormalities of gait and mobility: Secondary | ICD-10-CM | POA: Diagnosis not present

## 2015-01-19 DIAGNOSIS — K7011 Alcoholic hepatitis with ascites: Secondary | ICD-10-CM | POA: Diagnosis not present

## 2015-01-19 DIAGNOSIS — I480 Paroxysmal atrial fibrillation: Secondary | ICD-10-CM | POA: Diagnosis not present

## 2015-01-19 DIAGNOSIS — W19XXXD Unspecified fall, subsequent encounter: Secondary | ICD-10-CM | POA: Diagnosis not present

## 2015-01-19 DIAGNOSIS — R269 Unspecified abnormalities of gait and mobility: Secondary | ICD-10-CM | POA: Diagnosis not present

## 2015-01-19 DIAGNOSIS — I10 Essential (primary) hypertension: Secondary | ICD-10-CM | POA: Diagnosis not present

## 2015-01-19 DIAGNOSIS — S2241XD Multiple fractures of ribs, right side, subsequent encounter for fracture with routine healing: Secondary | ICD-10-CM | POA: Diagnosis not present

## 2015-01-20 ENCOUNTER — Encounter (HOSPITAL_COMMUNITY): Payer: Self-pay

## 2015-01-20 DIAGNOSIS — I10 Essential (primary) hypertension: Secondary | ICD-10-CM | POA: Diagnosis not present

## 2015-01-20 DIAGNOSIS — R269 Unspecified abnormalities of gait and mobility: Secondary | ICD-10-CM | POA: Diagnosis not present

## 2015-01-20 DIAGNOSIS — K7011 Alcoholic hepatitis with ascites: Secondary | ICD-10-CM | POA: Diagnosis not present

## 2015-01-20 DIAGNOSIS — W19XXXD Unspecified fall, subsequent encounter: Secondary | ICD-10-CM | POA: Diagnosis not present

## 2015-01-20 DIAGNOSIS — S2241XD Multiple fractures of ribs, right side, subsequent encounter for fracture with routine healing: Secondary | ICD-10-CM | POA: Diagnosis not present

## 2015-01-20 DIAGNOSIS — I480 Paroxysmal atrial fibrillation: Secondary | ICD-10-CM | POA: Diagnosis not present

## 2015-01-21 DIAGNOSIS — I10 Essential (primary) hypertension: Secondary | ICD-10-CM | POA: Diagnosis not present

## 2015-01-21 DIAGNOSIS — S2241XD Multiple fractures of ribs, right side, subsequent encounter for fracture with routine healing: Secondary | ICD-10-CM | POA: Diagnosis not present

## 2015-01-21 DIAGNOSIS — K7011 Alcoholic hepatitis with ascites: Secondary | ICD-10-CM | POA: Diagnosis not present

## 2015-01-21 DIAGNOSIS — I480 Paroxysmal atrial fibrillation: Secondary | ICD-10-CM | POA: Diagnosis not present

## 2015-01-21 DIAGNOSIS — R269 Unspecified abnormalities of gait and mobility: Secondary | ICD-10-CM | POA: Diagnosis not present

## 2015-01-21 DIAGNOSIS — W19XXXD Unspecified fall, subsequent encounter: Secondary | ICD-10-CM | POA: Diagnosis not present

## 2015-01-21 NOTE — Progress Notes (Signed)
Agree with Ms. Guenther's assessment and plan. Debbera Wolken E. Lincoln Ginley, MD, FACG   

## 2015-01-22 ENCOUNTER — Encounter (HOSPITAL_COMMUNITY): Payer: Self-pay

## 2015-01-22 DIAGNOSIS — I10 Essential (primary) hypertension: Secondary | ICD-10-CM | POA: Diagnosis not present

## 2015-01-22 DIAGNOSIS — K7011 Alcoholic hepatitis with ascites: Secondary | ICD-10-CM | POA: Diagnosis not present

## 2015-01-22 DIAGNOSIS — R269 Unspecified abnormalities of gait and mobility: Secondary | ICD-10-CM | POA: Diagnosis not present

## 2015-01-22 DIAGNOSIS — I480 Paroxysmal atrial fibrillation: Secondary | ICD-10-CM | POA: Diagnosis not present

## 2015-01-22 DIAGNOSIS — S2241XD Multiple fractures of ribs, right side, subsequent encounter for fracture with routine healing: Secondary | ICD-10-CM | POA: Diagnosis not present

## 2015-01-22 DIAGNOSIS — W19XXXD Unspecified fall, subsequent encounter: Secondary | ICD-10-CM | POA: Diagnosis not present

## 2015-01-25 ENCOUNTER — Encounter (HOSPITAL_COMMUNITY): Payer: Self-pay

## 2015-01-25 DIAGNOSIS — I10 Essential (primary) hypertension: Secondary | ICD-10-CM | POA: Diagnosis not present

## 2015-01-25 DIAGNOSIS — I480 Paroxysmal atrial fibrillation: Secondary | ICD-10-CM | POA: Diagnosis not present

## 2015-01-25 DIAGNOSIS — K7011 Alcoholic hepatitis with ascites: Secondary | ICD-10-CM | POA: Diagnosis not present

## 2015-01-25 DIAGNOSIS — S2241XD Multiple fractures of ribs, right side, subsequent encounter for fracture with routine healing: Secondary | ICD-10-CM | POA: Diagnosis not present

## 2015-01-25 DIAGNOSIS — W19XXXD Unspecified fall, subsequent encounter: Secondary | ICD-10-CM | POA: Diagnosis not present

## 2015-01-25 DIAGNOSIS — R269 Unspecified abnormalities of gait and mobility: Secondary | ICD-10-CM | POA: Diagnosis not present

## 2015-01-26 ENCOUNTER — Telehealth: Payer: Self-pay | Admitting: Oncology

## 2015-01-26 ENCOUNTER — Ambulatory Visit (HOSPITAL_BASED_OUTPATIENT_CLINIC_OR_DEPARTMENT_OTHER): Payer: Medicare Other | Admitting: Oncology

## 2015-01-26 ENCOUNTER — Other Ambulatory Visit (HOSPITAL_BASED_OUTPATIENT_CLINIC_OR_DEPARTMENT_OTHER): Payer: Medicare Other

## 2015-01-26 VITALS — BP 110/60 | HR 85 | Temp 98.1°F | Resp 20 | Ht 74.0 in | Wt 191.1 lb

## 2015-01-26 DIAGNOSIS — K7030 Alcoholic cirrhosis of liver without ascites: Secondary | ICD-10-CM

## 2015-01-26 DIAGNOSIS — C61 Malignant neoplasm of prostate: Secondary | ICD-10-CM

## 2015-01-26 DIAGNOSIS — C7951 Secondary malignant neoplasm of bone: Secondary | ICD-10-CM

## 2015-01-26 DIAGNOSIS — R17 Unspecified jaundice: Secondary | ICD-10-CM | POA: Diagnosis not present

## 2015-01-26 DIAGNOSIS — C801 Malignant (primary) neoplasm, unspecified: Secondary | ICD-10-CM | POA: Diagnosis not present

## 2015-01-26 LAB — CBC WITH DIFFERENTIAL/PLATELET
BASO%: 0.6 % (ref 0.0–2.0)
Basophils Absolute: 0 10*3/uL (ref 0.0–0.1)
EOS ABS: 0.2 10*3/uL (ref 0.0–0.5)
EOS%: 3 % (ref 0.0–7.0)
HEMATOCRIT: 31.8 % — AB (ref 38.4–49.9)
HGB: 10.6 g/dL — ABNORMAL LOW (ref 13.0–17.1)
LYMPH%: 20.7 % (ref 14.0–49.0)
MCH: 36.6 pg — ABNORMAL HIGH (ref 27.2–33.4)
MCHC: 33.3 g/dL (ref 32.0–36.0)
MCV: 109.7 fL — ABNORMAL HIGH (ref 79.3–98.0)
MONO#: 0.3 10*3/uL (ref 0.1–0.9)
MONO%: 5.4 % (ref 0.0–14.0)
NEUT#: 3.8 10*3/uL (ref 1.5–6.5)
NEUT%: 70.3 % (ref 39.0–75.0)
NRBC: 0 % (ref 0–0)
PLATELETS: 177 10*3/uL (ref 140–400)
RBC: 2.9 10*6/uL — AB (ref 4.20–5.82)
RDW: 15.6 % — ABNORMAL HIGH (ref 11.0–14.6)
WBC: 5.4 10*3/uL (ref 4.0–10.3)
lymph#: 1.1 10*3/uL (ref 0.9–3.3)

## 2015-01-26 LAB — COMPREHENSIVE METABOLIC PANEL (CC13)
ALK PHOS: 163 U/L — AB (ref 40–150)
ALT: 32 U/L (ref 0–55)
AST: 57 U/L — ABNORMAL HIGH (ref 5–34)
Albumin: 3.2 g/dL — ABNORMAL LOW (ref 3.5–5.0)
Anion Gap: 7 mEq/L (ref 3–11)
BILIRUBIN TOTAL: 1.86 mg/dL — AB (ref 0.20–1.20)
BUN: 15.3 mg/dL (ref 7.0–26.0)
CO2: 25 meq/L (ref 22–29)
Calcium: 9.1 mg/dL (ref 8.4–10.4)
Chloride: 102 mEq/L (ref 98–109)
Creatinine: 1.2 mg/dL (ref 0.7–1.3)
EGFR: 66 mL/min/{1.73_m2} — AB (ref >90)
Glucose: 102 mg/dl (ref 70–140)
Potassium: 4.1 mEq/L (ref 3.5–5.1)
SODIUM: 134 meq/L — AB (ref 136–145)
Total Protein: 8.4 g/dL — ABNORMAL HIGH (ref 6.4–8.3)

## 2015-01-26 NOTE — Telephone Encounter (Signed)
per pof to sch pt appt-gave pt copy of avs °

## 2015-01-26 NOTE — Progress Notes (Signed)
Hematology and Oncology Follow Up Visit  ALOK MINSHALL [id number] 1951-04-23 64 y.o. 01/26/2015 1:17 PM [name], MDPerini, [name], MD   Principle Diagnosis: 64 year old gentleman with castration resistant prostate cancer with metastatic disease to the bone. He was initially diagnosed in October of 2009 with bony metastasis.   Prior Therapy: He was treated with hormonal therapy with monthly Fermagon injections and monthly Zometa infusions. PSA fell from 72.9 in October 2009 to a nadir of 0.24 by June of 2012.  PSA began to rise again and was 4.09 on 02/06/2012. Bone scan at that point did not show any obvious new lesions. He was started on Xtandi and once again has had a very nice response since.   Current therapy: Xtandi 160 mg daily started September of 2013 with an excellent response PSA is close to 0. Has been on hold since 11/2014 due to liver disease.   Interim History:  Mr. Anding presents today for a followup visit. Since the last visit, he developed decompensated hepatic failure and encephalopathy. He had a prolonged hospitalization and he is currently recovering from it. He developed encephalopathy as well as sepsis and as mentioned have been treated for both and recovered well. Xtandi continue to be on hold without any complications. He is recovering well at this time with improvement in his mobility and performance status. His ascites have improved and no longer reporting encephalopathy. He does report intermittent neuropathy which has not changed. Has not reported any nausea or vomiting or abdominal pain. Has not reported any neurological symptoms such as syncope or seizures. He has reported increased unsteadiness and increase in his abdominal girth and early satiety. He has not reported any syncope or seizures. He has not reported any back pain shoulder pain or pathological fractures. He continues to perform activities of daily without any hindrance or decline. Has not reported any lower  extremity edema or recent hospitalization. Has not reported any genitourinary complaints, petechiae or lymphadenopathy.  The rest of his review of systems unremarkable.   Medications: I have reviewed the patient's current medications.  Current Outpatient Prescriptions  Medication Sig Dispense Refill  . CALCIUM CARB-CHOLECALCIFEROL PO Take 1 capsule by mouth 2 (two) times daily.     . clopidogrel (PLAVIX) 75 MG tablet TAKE ONE TABLET BY MOUTH ONCE DAILY (Patient taking differently: Take 75 mg by mouth daily. ) 30 tablet 11  . fentaNYL (DURAGESIC - DOSED MCG/HR) 25 MCG/HR patch Place 1 patch (25 mcg total) onto the skin every 3 (three) days. 5 patch 0  . folic acid (FOLVITE) 1 MG tablet Take one tablet by mouth once a day.... (Patient taking differently: Take 1 mg by mouth daily. ) 30 tablet 6  . furosemide (LASIX) 20 MG tablet Take 0.5 tablets (10 mg total) by mouth daily. 30 tablet 1  . lactulose (CHRONULAC) 10 GM/15ML solution Take 22.5 mLs (15 g total) by mouth 3 (three) times daily. (Patient taking differently: Take 15 g by mouth 2 (two) times daily. ) 240 mL 0  . lidocaine (LIDODERM) 5 % Place 1 patch onto the skin daily. Remove & Discard patch within 12 hours or as directed by MD. Apply to lower back. 30 patch 0  . Multiple Vitamin (MULTIVITAMIN) tablet Take 1 tablet by mouth daily.      [name] oxyCODONE (OXY IR/ROXICODONE) 5 MG immediate release tablet Take 0.5-1 tablets (2.5-5 mg total) by mouth every 6 (six) hours as needed for moderate pain or severe pain. (Patient taking differently: Take 2.5-5  mg by mouth at bedtime as needed for moderate pain or severe pain. )    . Probiotic Product (ALIGN) 4 MG CAPS Take 1 capsule by mouth daily.    . rifaximin (XIFAXAN) 550 MG TABS tablet Take 1 tablet (550 mg total) by mouth 2 (two) times daily. 60 tablet 0  . spironolactone (ALDACTONE) 25 MG tablet Take 1 tablet (25 mg total) by mouth daily. 30 tablet 1  . sulfamethoxazole-trimethoprim (BACTRIM  DS,SEPTRA DS) 800-160 MG per tablet Take 1 tablet by mouth daily.     [name] thiamine 100 MG tablet Take 1 tablet (100 mg total) by mouth daily. 30 tablet 0  . UNKNOWN TO PATIENT Slow release hormone implant placed 8/13 by Dr. Willette Cluster.  Good for one year     No current facility-administered medications for this visit.     Allergies: No Known Allergies  Past Medical History, Surgical history, Social history, and Family History were reviewed and updated.   Physical Exam: Blood pressure 110/60, pulse 85, temperature 98.1 F (36.7 C), temperature source Oral, resp. rate 20, height 6\' 2"  (1.88 m), weight 191 lb 1.6 oz (86.682 kg), SpO2 100 %. ECOG: 1 General appearance: alert and cooperative not in any distress. Head: Normocephalic, without obvious abnormality, atraumatic  Sclera is less anicteric Neck: no adenopathy Lymph nodes: Cervical, supraclavicular, and axillary nodes normal. Heart:regular rate and rhythm, S1, S2 normal, no murmur, click, rub or gallop Lung:chest clear, no wheezing, rales, normal symmetric air entry Abdomin: soft, non-tender, without masses or organomegaly. No shifting dullness or ascites noted. EXT:no erythema, induration, or nodules   Lab Results: Lab Results  Component Value Date   WBC 5.4 01/26/2015   HGB 10.6* 01/26/2015   HCT 31.8* 01/26/2015   MCV 109.7* 01/26/2015   PLT 177 01/26/2015     Chemistry      Component Value Date/Time   NA 133* 12/27/2014 0527   NA 131* 11/10/2014 1532   K 3.9 12/27/2014 0527   K 3.3* 11/10/2014 1532   CL 102 12/27/2014 0527   CL 100 12/10/2012 1540   CO2 25 12/27/2014 0527   CO2 23 11/10/2014 1532   BUN 8 12/27/2014 0527   BUN 5.6* 11/10/2014 1532   CREATININE 0.71 12/27/2014 0527   CREATININE 0.8 11/10/2014 1532      Component Value Date/Time   CALCIUM 7.6* 12/27/2014 0527   CALCIUM 7.5* 11/10/2014 1532   ALKPHOS 127* 12/26/2014 0527   ALKPHOS 231* 11/10/2014 1532   AST 83* 12/26/2014 0527   AST 145*  11/10/2014 1532   ALT 34 12/26/2014 0527   ALT 35 11/10/2014 1532   BILITOT 2.7* 12/26/2014 0527   BILITOT 7.02* 11/10/2014 1532         Results for MOISE, FRIDAY (MRN [id number]) as of 01/26/2015 13:21  Ref. Range 05/27/2014 13:25 08/25/2014 11:15 11/10/2014 15:32  PSA Latest Ref Range: <=4.00 ng/mL <0.01 <0.01 0.01      Impression and Plan:  64 year old on with the following issues:  1. Castration resistant prostate cancer with metastatic disease to the bone. His continued on Xtandi with excellent response and a PSA of less than 0.01. I do not think his Gillermina Phy is contributing to his jaundice and for the time being I have asked him to continue it. He has been on it for at least 3 years without any previous problems.   Risks and benefits of continuing xtandi versus continue to hold were discussed. I do not think xtandi has any  hepatic effects but given his PSA continues to be under excellent control I see no reason to start it at this time. If his PSA starts to rise, we will consider restarting it at that time. Low doses can be considered as well.  2. Jaundice related to alcoholic liver cirrhosis: His bilirubin is down to 1.86 but the remainder of his liver function tests remain slightly elevated. He continues to follow up with gastroenterology.  Cancer or his cancer treatment is contributing to his jaundice or liver disease.  3. Androgen deprivation: He is currently on androgen depravation. I recommend this continues for the time being.   4. Bone health: He has a bone density intermittently.  5. Followup: Will be in 6 weeks for a recheck of his PSA at that time.  Zola Button, MD 6/21/20161:17 PM

## 2015-01-27 ENCOUNTER — Telehealth: Payer: Self-pay | Admitting: *Deleted

## 2015-01-27 ENCOUNTER — Encounter (HOSPITAL_COMMUNITY): Payer: Self-pay

## 2015-01-27 DIAGNOSIS — C61 Malignant neoplasm of prostate: Secondary | ICD-10-CM | POA: Diagnosis not present

## 2015-01-27 DIAGNOSIS — S2239XA Fracture of one rib, unspecified side, initial encounter for closed fracture: Secondary | ICD-10-CM | POA: Diagnosis not present

## 2015-01-27 DIAGNOSIS — E876 Hypokalemia: Secondary | ICD-10-CM | POA: Diagnosis not present

## 2015-01-27 DIAGNOSIS — Z6824 Body mass index (BMI) 24.0-24.9, adult: Secondary | ICD-10-CM | POA: Diagnosis not present

## 2015-01-27 DIAGNOSIS — I4891 Unspecified atrial fibrillation: Secondary | ICD-10-CM | POA: Diagnosis not present

## 2015-01-27 DIAGNOSIS — K7030 Alcoholic cirrhosis of liver without ascites: Secondary | ICD-10-CM | POA: Diagnosis not present

## 2015-01-27 DIAGNOSIS — G9340 Encephalopathy, unspecified: Secondary | ICD-10-CM | POA: Diagnosis not present

## 2015-01-27 DIAGNOSIS — I10 Essential (primary) hypertension: Secondary | ICD-10-CM | POA: Diagnosis not present

## 2015-01-27 LAB — PSA

## 2015-01-27 NOTE — Telephone Encounter (Signed)
Per Dr. [name], I informed the patient that his PSA level was 0.01. Patient verbalized understanding.

## 2015-01-27 NOTE — Telephone Encounter (Signed)
-----   Message from Wyatt Portela, MD sent at 01/27/2015 10:44 AM EDT ----- Please call the result of his PSA. Very low.

## 2015-01-28 DIAGNOSIS — W19XXXD Unspecified fall, subsequent encounter: Secondary | ICD-10-CM | POA: Diagnosis not present

## 2015-01-28 DIAGNOSIS — S2241XD Multiple fractures of ribs, right side, subsequent encounter for fracture with routine healing: Secondary | ICD-10-CM | POA: Diagnosis not present

## 2015-01-28 DIAGNOSIS — I10 Essential (primary) hypertension: Secondary | ICD-10-CM | POA: Diagnosis not present

## 2015-01-28 DIAGNOSIS — K7011 Alcoholic hepatitis with ascites: Secondary | ICD-10-CM | POA: Diagnosis not present

## 2015-01-28 DIAGNOSIS — R269 Unspecified abnormalities of gait and mobility: Secondary | ICD-10-CM | POA: Diagnosis not present

## 2015-01-28 DIAGNOSIS — I480 Paroxysmal atrial fibrillation: Secondary | ICD-10-CM | POA: Diagnosis not present

## 2015-01-29 ENCOUNTER — Encounter (HOSPITAL_COMMUNITY): Payer: Self-pay

## 2015-02-01 ENCOUNTER — Encounter (HOSPITAL_COMMUNITY): Payer: Self-pay

## 2015-02-03 ENCOUNTER — Encounter (HOSPITAL_COMMUNITY): Payer: Self-pay

## 2015-02-03 DIAGNOSIS — I4891 Unspecified atrial fibrillation: Secondary | ICD-10-CM | POA: Diagnosis not present

## 2015-02-03 DIAGNOSIS — F419 Anxiety disorder, unspecified: Secondary | ICD-10-CM | POA: Diagnosis not present

## 2015-02-03 DIAGNOSIS — G9340 Encephalopathy, unspecified: Secondary | ICD-10-CM | POA: Diagnosis not present

## 2015-02-03 DIAGNOSIS — Z6825 Body mass index (BMI) 25.0-25.9, adult: Secondary | ICD-10-CM | POA: Diagnosis not present

## 2015-02-03 DIAGNOSIS — K7030 Alcoholic cirrhosis of liver without ascites: Secondary | ICD-10-CM | POA: Diagnosis not present

## 2015-02-03 DIAGNOSIS — C61 Malignant neoplasm of prostate: Secondary | ICD-10-CM | POA: Diagnosis not present

## 2015-02-03 DIAGNOSIS — S2239XA Fracture of one rib, unspecified side, initial encounter for closed fracture: Secondary | ICD-10-CM | POA: Diagnosis not present

## 2015-02-03 DIAGNOSIS — I10 Essential (primary) hypertension: Secondary | ICD-10-CM | POA: Diagnosis not present

## 2015-02-05 ENCOUNTER — Encounter (HOSPITAL_COMMUNITY): Payer: Self-pay

## 2015-02-10 ENCOUNTER — Encounter (HOSPITAL_COMMUNITY): Payer: Self-pay

## 2015-02-12 ENCOUNTER — Encounter (HOSPITAL_COMMUNITY): Payer: Self-pay

## 2015-02-15 ENCOUNTER — Encounter (HOSPITAL_COMMUNITY): Payer: Self-pay

## 2015-02-16 ENCOUNTER — Telehealth: Payer: Self-pay | Admitting: Internal Medicine

## 2015-02-16 NOTE — Telephone Encounter (Signed)
Patient's wife notified that will have labs at the office visit if necessary

## 2015-02-17 ENCOUNTER — Encounter (HOSPITAL_COMMUNITY): Payer: Self-pay

## 2015-02-19 ENCOUNTER — Encounter (HOSPITAL_COMMUNITY): Payer: Self-pay

## 2015-02-22 ENCOUNTER — Encounter (HOSPITAL_COMMUNITY): Payer: Self-pay

## 2015-02-24 ENCOUNTER — Encounter (HOSPITAL_COMMUNITY): Payer: Self-pay

## 2015-02-26 ENCOUNTER — Encounter (HOSPITAL_COMMUNITY): Payer: Self-pay

## 2015-03-01 ENCOUNTER — Encounter: Payer: Self-pay | Admitting: Internal Medicine

## 2015-03-01 ENCOUNTER — Ambulatory Visit (INDEPENDENT_AMBULATORY_CARE_PROVIDER_SITE_OTHER): Payer: Medicare Other | Admitting: Internal Medicine

## 2015-03-01 ENCOUNTER — Encounter (HOSPITAL_COMMUNITY): Payer: Self-pay

## 2015-03-01 VITALS — BP 120/74 | HR 72 | Ht 74.0 in | Wt 191.0 lb

## 2015-03-01 DIAGNOSIS — K7290 Hepatic failure, unspecified without coma: Secondary | ICD-10-CM

## 2015-03-01 DIAGNOSIS — R188 Other ascites: Secondary | ICD-10-CM

## 2015-03-01 DIAGNOSIS — K7031 Alcoholic cirrhosis of liver with ascites: Secondary | ICD-10-CM

## 2015-03-01 DIAGNOSIS — K7682 Hepatic encephalopathy: Secondary | ICD-10-CM

## 2015-03-01 MED ORDER — SULFAMETHOXAZOLE-TRIMETHOPRIM 800-160 MG PO TABS: 1.0000 | ORAL_TABLET | Freq: Every day | ORAL | Status: DC

## 2015-03-01 NOTE — Patient Instructions (Addendum)
Your physician has requested that you get lab work done when  you go to the cancer center next week PT/INR and ammonia level  Please call in Aug to make a 3 month follow up appointment  with Dr. Carlean Purl   I appreciate the opportunity to care for you. Gatha Mayer, MD, [facility]

## 2015-03-01 NOTE — Progress Notes (Signed)
Subjective:    Patient ID: Allen Schroeder, male    DOB: 1951/07/28, 64 y.o.   MRN: [id number] Cc: f/u a;coholic cirrhosis/hepatitis HPI Here w/ wife Back to work Reading books Abstinent improving Medications, allergies, past medical history, past surgical history, family history and social history are reviewed and updated in the EMR.  Review of Systems Ambulates with cane , ankle pain and neuropathy    Objective:   Physical Exam  @BP  120/74 mmHg  Pulse 72  Ht 6\' 2"  (1.88 m)  Wt 191 lb (86.637 kg)  BMI 24.51 kg/m2@  General:  Well-developed, well-nourished and in no acute distress Eyes:  anicteric. Lungs: Clear to auscultation bilaterally. Heart:  S1S2, no rubs, murmurs, gallops. Abdomen:  soft, non-tender, no hepatosplenomegaly, hernia, or mass and BS+.  Extremities:   no edema, cyanosis or clubbing Skin   no rash. Neuro:  A&O x 3. No asterixis Psych:  appropriate mood and  Affect.   Data Reviewed: Lab Results  Component Value Date   WBC 5.4 01/26/2015   HGB 10.6* 01/26/2015   HCT 31.8* 01/26/2015   MCV 109.7* 01/26/2015   PLT 177 01/26/2015     Chemistry      Component Value Date/Time   NA 134* 01/26/2015 1242   NA 133* 12/27/2014 0527   K 4.1 01/26/2015 1242   K 3.9 12/27/2014 0527   CL 102 12/27/2014 0527   CL 100 12/10/2012 1540   CO2 25 01/26/2015 1242   CO2 25 12/27/2014 0527   BUN 15.3 01/26/2015 1242   BUN 8 12/27/2014 0527   CREATININE 1.2 01/26/2015 1242   CREATININE 0.71 12/27/2014 0527      Component Value Date/Time   CALCIUM 9.1 01/26/2015 1242   CALCIUM 7.6* 12/27/2014 0527   ALKPHOS 163* 01/26/2015 1242   ALKPHOS 127* 12/26/2014 0527   AST 57* 01/26/2015 1242   AST 83* 12/26/2014 0527   ALT 32 01/26/2015 1242   ALT 34 12/26/2014 0527   BILITOT 1.86* 01/26/2015 1242   BILITOT 2.7* 12/26/2014 0527     Last NH3 NL      Assessment & Plan:   Alcoholic cirrhosis of liver with ascites - Plan: INR/PT, Ammonia  Encephalopathy,  hepatic - Plan: INR/PT, Ammonia  Ascites - Plan: INR/PT, Ammonia  Overall improved significantly.   NH3 and INR with CBC and CMET at cancer center next week.  ? Wean lactulose aand diuretics Reasonable to retry prostrate cancer rx  Reduce Septra DS to 1 qd  RTCV 3-4 months   Current outpatient prescriptions:  .  CALCIUM CARB-CHOLECALCIFEROL PO, Take 1 capsule by mouth 2 (two) times daily. , Disp: , Rfl:  .  clopidogrel (PLAVIX) 75 MG tablet, TAKE ONE TABLET BY MOUTH ONCE DAILY (Patient taking differently: Take 75 mg by mouth daily. ), Disp: 30 tablet, Rfl: 11 .  fentaNYL (DURAGESIC - DOSED MCG/HR) 25 MCG/HR patch, Place 1 patch (25 mcg total) onto the skin every 3 (three) days. (Patient taking differently: Place 12.5 mcg onto the skin every 3 (three) days. ), Disp: 5 patch, Rfl: 0 .  folic acid (FOLVITE) 1 MG tablet, Take one tablet by mouth once a day.... (Patient taking differently: Take 1 mg by mouth daily. ), Disp: 30 tablet, Rfl: 6 .  furosemide (LASIX) 20 MG tablet, Take 0.5 tablets (10 mg total) by mouth daily., Disp: 30 tablet, Rfl: 1 .  lactulose (CHRONULAC) 10 GM/15ML solution, Take 22.5 mLs (15 g total) by mouth 3 (three) times  daily. (Patient taking differently: Take 15 g by mouth 2 (two) times daily. ), Disp: 240 mL, Rfl: 0 .  Multiple Vitamin (MULTIVITAMIN) tablet, Take 1 tablet by mouth daily.  , Disp: , Rfl:  .  oxyCODONE (OXY IR/ROXICODONE) 5 MG immediate release tablet, Take 0.5-1 tablets (2.5-5 mg total) by mouth every 6 (six) hours as needed for moderate pain or severe pain. (Patient taking differently: Take 2.5-5 mg by mouth at bedtime as needed for moderate pain or severe pain. ), Disp: , Rfl:  .  Probiotic Product (ALIGN) 4 MG CAPS, Take 1 capsule by mouth daily., Disp: , Rfl:  .  rifaximin (XIFAXAN) 550 MG TABS tablet, Take 1 tablet (550 mg total) by mouth 2 (two) times daily., Disp: 60 tablet, Rfl: 0 .  spironolactone (ALDACTONE) 25 MG tablet, Take 1 tablet (25 mg  total) by mouth daily., Disp: 30 tablet, Rfl: 1 .  sulfamethoxazole-trimethoprim (BACTRIM DS,SEPTRA DS) 800-160 MG per tablet, Take 1 tablet by mouth daily., Disp: , Rfl:  .  thiamine 100 MG tablet, Take 1 tablet (100 mg total) by mouth daily., Disp: 30 tablet, Rfl: 0 .  UNKNOWN TO PATIENT, Slow release hormone implant placed 8/15 by Dr. Willette Cluster.  Good for one year, Disp: , Rfl:   BT:YOMAYO,KHTX A, MD Zola Button, MD

## 2015-03-01 NOTE — Assessment & Plan Note (Signed)
Much better INR and NH3

## 2015-03-01 NOTE — Assessment & Plan Note (Signed)
Doing well NH3 next week

## 2015-03-01 NOTE — Assessment & Plan Note (Signed)
Clinically gone

## 2015-03-03 ENCOUNTER — Encounter (HOSPITAL_COMMUNITY): Payer: Self-pay

## 2015-03-05 ENCOUNTER — Encounter (HOSPITAL_COMMUNITY): Payer: Self-pay

## 2015-03-09 ENCOUNTER — Other Ambulatory Visit (HOSPITAL_BASED_OUTPATIENT_CLINIC_OR_DEPARTMENT_OTHER): Payer: Medicare Other

## 2015-03-09 DIAGNOSIS — C61 Malignant neoplasm of prostate: Secondary | ICD-10-CM | POA: Diagnosis not present

## 2015-03-09 DIAGNOSIS — C801 Malignant (primary) neoplasm, unspecified: Secondary | ICD-10-CM | POA: Diagnosis not present

## 2015-03-09 DIAGNOSIS — K7031 Alcoholic cirrhosis of liver with ascites: Secondary | ICD-10-CM | POA: Diagnosis not present

## 2015-03-09 LAB — CBC WITH DIFFERENTIAL/PLATELET
BASO%: 0.9 % (ref 0.0–2.0)
BASOS ABS: 0.1 10*3/uL (ref 0.0–0.1)
EOS%: 3.5 % (ref 0.0–7.0)
Eosinophils Absolute: 0.2 10*3/uL (ref 0.0–0.5)
HCT: 31.9 % — ABNORMAL LOW (ref 38.4–49.9)
HEMOGLOBIN: 10.8 g/dL — AB (ref 13.0–17.1)
LYMPH#: 0.8 10*3/uL — AB (ref 0.9–3.3)
LYMPH%: 13.1 % — ABNORMAL LOW (ref 14.0–49.0)
MCH: 35.2 pg — ABNORMAL HIGH (ref 27.2–33.4)
MCHC: 33.8 g/dL (ref 32.0–36.0)
MCV: 104.1 fL — ABNORMAL HIGH (ref 79.3–98.0)
MONO#: 0.3 10*3/uL (ref 0.1–0.9)
MONO%: 4.9 % (ref 0.0–14.0)
NEUT%: 77.6 % — ABNORMAL HIGH (ref 39.0–75.0)
NEUTROS ABS: 4.9 10*3/uL (ref 1.5–6.5)
PLATELETS: 119 10*3/uL — AB (ref 140–400)
RBC: 3.06 10*6/uL — AB (ref 4.20–5.82)
RDW: 18.5 % — ABNORMAL HIGH (ref 11.0–14.6)
WBC: 6.4 10*3/uL (ref 4.0–10.3)

## 2015-03-09 LAB — COMPREHENSIVE METABOLIC PANEL (CC13)
ALBUMIN: 3.6 g/dL (ref 3.5–5.0)
ALK PHOS: 123 U/L (ref 40–150)
ALT: 26 U/L (ref 0–55)
ANION GAP: 6 meq/L (ref 3–11)
AST: 52 U/L — AB (ref 5–34)
BILIRUBIN TOTAL: 1.33 mg/dL — AB (ref 0.20–1.20)
BUN: 14.8 mg/dL (ref 7.0–26.0)
CHLORIDE: 103 meq/L (ref 98–109)
CO2: 27 mEq/L (ref 22–29)
CREATININE: 1 mg/dL (ref 0.7–1.3)
Calcium: 9.8 mg/dL (ref 8.4–10.4)
EGFR: 77 mL/min/{1.73_m2} — AB (ref >90)
Glucose: 92 mg/dl (ref 70–140)
Potassium: 4.4 mEq/L (ref 3.5–5.1)
Sodium: 136 mEq/L (ref 136–145)
TOTAL PROTEIN: 8.1 g/dL (ref 6.4–8.3)

## 2015-03-10 LAB — PSA: PSA: 0.02 ng/mL (ref <=4.00)

## 2015-03-11 ENCOUNTER — Ambulatory Visit (HOSPITAL_BASED_OUTPATIENT_CLINIC_OR_DEPARTMENT_OTHER): Payer: Medicare Other | Admitting: Oncology

## 2015-03-11 VITALS — BP 117/62 | HR 81 | Temp 98.2°F | Resp 18 | Ht 74.0 in | Wt 197.6 lb

## 2015-03-11 DIAGNOSIS — C61 Malignant neoplasm of prostate: Secondary | ICD-10-CM | POA: Diagnosis not present

## 2015-03-11 DIAGNOSIS — C7951 Secondary malignant neoplasm of bone: Secondary | ICD-10-CM

## 2015-03-11 NOTE — Progress Notes (Signed)
Hematology and Oncology Follow Up Visit  Allen Schroeder [id number] 07/23/1951 64 y.o. 03/11/2015 3:32 PM Allen Schroeder, MDPerini, Allen Schroeder, Allen Schroeder   Principle Diagnosis: 64 year old gentleman with castration resistant prostate cancer with metastatic disease to the bone. He was initially diagnosed in October of 2009 with bony metastasis.   Prior Therapy: He was treated with hormonal therapy with monthly Fermagon injections and monthly Zometa infusions. PSA fell from 72.9 in October 2009 to a nadir of 0.24 by June of 2012.  PSA began to rise again and was 4.09 on 02/06/2012. Bone scan at that point did not show any obvious new lesions. He was started on Xtandi and once again has had a very nice response since.   Current therapy: Xtandi 160 mg daily started September of 2013 with an excellent response PSA is close to 0. Has been on hold since 11/2014 due to liver disease.   Interim History:  Allen Schroeder presents today for a followup visit. Since the last visit, he continues recovering well at this time with improvement in his mobility and performance status. His ascites has resolved no longer reporting encephalopathy. He is able to resume work-related duties and ambulate without any difficulties. He has not had any falls or syncope. He does report intermittent neuropathy which has not changed. Has not reported any nausea or vomiting or abdominal pain. Has not reported any seizures. He has reported increased unsteadiness and increase in his abdominal girth and early satiety. He has not reported any syncope or seizures. He has not reported any back pain shoulder pain or pathological fractures. He continues to perform activities of daily without any hindrance or decline. Has not reported any lower extremity edema or recent hospitalization. Has not reported any genitourinary complaints, petechiae or lymphadenopathy.  The rest of his review of systems unremarkable.   Medications: I have reviewed the patient's current  medications.  Current Outpatient Prescriptions  Medication Sig Dispense Refill  . CALCIUM CARB-CHOLECALCIFEROL PO Take 1 capsule by mouth 2 (two) times daily.     . clopidogrel (PLAVIX) 75 MG tablet TAKE ONE TABLET BY MOUTH ONCE DAILY (Patient taking differently: Take 75 mg by mouth daily. ) 30 tablet 11  . fentaNYL (DURAGESIC - DOSED MCG/HR) 12 MCG/HR Place 12 mcg onto the skin every 3 (three) days.  0  . folic acid (FOLVITE) 1 MG tablet Take one tablet by mouth once a day.... (Patient taking differently: Take 1 mg by mouth daily. ) 30 tablet 6  . furosemide (LASIX) 20 MG tablet Take 0.5 tablets (10 mg total) by mouth daily. 30 tablet 1  . lactulose (CHRONULAC) 10 GM/15ML solution Take 22.5 mLs (15 g total) by mouth 3 (three) times daily. (Patient taking differently: Take 15 g by mouth 2 (two) times daily. ) 240 mL 0  . Multiple Vitamin (MULTIVITAMIN) tablet Take 1 tablet by mouth daily.      [name] oxyCODONE (OXY IR/ROXICODONE) 5 MG immediate release tablet Take 0.5-1 tablets (2.5-5 mg total) by mouth every 6 (six) hours as needed for moderate pain or severe pain. (Patient taking differently: Take 2.5-5 mg by mouth at bedtime as needed for moderate pain or severe pain. )    . Probiotic Product (ALIGN) 4 MG CAPS Take 1 capsule by mouth daily.    . rifaximin (XIFAXAN) 550 MG TABS tablet Take 1 tablet (550 mg total) by mouth 2 (two) times daily. 60 tablet 0  . spironolactone (ALDACTONE) 25 MG tablet Take 1 tablet (25 mg total) by mouth  daily. 30 tablet 1  . sulfamethoxazole-trimethoprim (BACTRIM DS,SEPTRA DS) 800-160 MG per tablet Take 1 tablet by mouth daily.    [name] thiamine 100 MG tablet Take 1 tablet (100 mg total) by mouth daily. 30 tablet 0  . UNKNOWN TO PATIENT Slow release hormone implant placed 8/15 by Dr. Willette Cluster.  Good for one year    . Vitamins-Lipotropics ([employer] BALANCED B-100) TABS Take 1 tablet by mouth daily.  5   No current facility-administered medications for this visit.      Allergies: No Known Allergies  Past Medical History, Surgical history, Social history, and Family History were reviewed and updated.   Physical Exam: Blood pressure 117/62, pulse 81, temperature 98.2 F (36.8 C), temperature source Oral, resp. rate 18, height 6\' 2"  (1.88 m), weight 197 lb 9.6 oz (89.631 kg), SpO2 98 %. ECOG: 1 General appearance: alert and cooperative not in any distress. Head: Normocephalic, without obvious abnormality, atraumatic  Sclera is anicteric. Neck: no adenopathy Lymph nodes: Cervical, supraclavicular, and axillary nodes normal. Heart:regular rate and rhythm, S1, S2 normal, no murmur, click, rub or gallop Lung:chest clear, no wheezing, rales, normal symmetric air entry Abdomin: soft, non-tender, without masses or organomegaly. No shifting dullness or ascites noted. EXT:no erythema, induration, or nodules   Lab Results: Lab Results  Component Value Date   WBC 6.4 03/09/2015   HGB 10.8* 03/09/2015   HCT 31.9* 03/09/2015   MCV 104.1* 03/09/2015   PLT 119* 03/09/2015     Chemistry      Component Value Date/Time   NA 136 03/09/2015 1028   NA 133* 12/27/2014 0527   K 4.4 03/09/2015 1028   K 3.9 12/27/2014 0527   CL 102 12/27/2014 0527   CL 100 12/10/2012 1540   CO2 27 03/09/2015 1028   CO2 25 12/27/2014 0527   BUN 14.8 03/09/2015 1028   BUN 8 12/27/2014 0527   CREATININE 1.0 03/09/2015 1028   CREATININE 0.71 12/27/2014 0527      Component Value Date/Time   CALCIUM 9.8 03/09/2015 1028   CALCIUM 7.6* 12/27/2014 0527   ALKPHOS 123 03/09/2015 1028   ALKPHOS 127* 12/26/2014 0527   AST 52* 03/09/2015 1028   AST 83* 12/26/2014 0527   ALT 26 03/09/2015 1028   ALT 34 12/26/2014 0527   BILITOT 1.33* 03/09/2015 1028   BILITOT 2.7* 12/26/2014 0527         Results for Allen Schroeder (MRN [id number]) as of 03/11/2015 14:38  Ref. Range 03/09/2015 10:28  PSA Latest Ref Range: <=4.00 ng/mL 0.02       Impression and Plan:  64 year old on  with the following issues:  1. Castration resistant prostate cancer with metastatic disease to the bone. His continued on Xtandi with excellent response and a PSA of less than 0.01. I do not think his Gillermina Phy is contributing to his jaundice and for the time being I have asked him to continue it. He has been on it for at least 3 years without any previous problems.   Risks and benefits of continuing Xtandi versus continue to hold were discussed. I do not think xtandi has any hepatic effects but given his PSA continues to be under excellent control I see no reason to start it at this time. His performance status and activity level continue to improve and would like you to be slightly better before we start this medication. I anticipate starting it in 6 weeks. I will recheck his labs at this time and make the  final decision.  2. Jaundice related to alcoholic liver cirrhosis: His bilirubin is down to 1.33 and his liver function tests continue to improve. He is following up with Dr. Carlean Purl and continue to abstain from alcohol.   3. Androgen deprivation: He is currently on androgen depravation. I recommend this continues for the time being.     4. Followup: Will be in 6 weeks for a recheck of his PSA at that time.  Zola Button, Allen Schroeder 8/4/20163:32 PM

## 2015-04-21 DIAGNOSIS — M8710 Osteonecrosis due to drugs, unspecified bone: Secondary | ICD-10-CM | POA: Diagnosis not present

## 2015-04-21 DIAGNOSIS — C7951 Secondary malignant neoplasm of bone: Secondary | ICD-10-CM | POA: Diagnosis not present

## 2015-04-21 DIAGNOSIS — C61 Malignant neoplasm of prostate: Secondary | ICD-10-CM | POA: Diagnosis not present

## 2015-04-21 DIAGNOSIS — R972 Elevated prostate specific antigen [PSA]: Secondary | ICD-10-CM | POA: Diagnosis not present

## 2015-04-21 DIAGNOSIS — G629 Polyneuropathy, unspecified: Secondary | ICD-10-CM | POA: Diagnosis not present

## 2015-04-27 ENCOUNTER — Other Ambulatory Visit (HOSPITAL_BASED_OUTPATIENT_CLINIC_OR_DEPARTMENT_OTHER): Payer: Medicare Other

## 2015-04-27 DIAGNOSIS — C801 Malignant (primary) neoplasm, unspecified: Secondary | ICD-10-CM | POA: Diagnosis not present

## 2015-04-27 DIAGNOSIS — C61 Malignant neoplasm of prostate: Secondary | ICD-10-CM

## 2015-04-27 LAB — CBC WITH DIFFERENTIAL/PLATELET
BASO%: 0.9 % (ref 0.0–2.0)
BASOS ABS: 0.1 10*3/uL (ref 0.0–0.1)
EOS ABS: 0.3 10*3/uL (ref 0.0–0.5)
EOS%: 3.9 % (ref 0.0–7.0)
HEMATOCRIT: 32.2 % — AB (ref 38.4–49.9)
HEMOGLOBIN: 10.9 g/dL — AB (ref 13.0–17.1)
LYMPH#: 1.1 10*3/uL (ref 0.9–3.3)
LYMPH%: 16.2 % (ref 14.0–49.0)
MCH: 35.5 pg — AB (ref 27.2–33.4)
MCHC: 33.7 g/dL (ref 32.0–36.0)
MCV: 105.3 fL — AB (ref 79.3–98.0)
MONO#: 0.3 10*3/uL (ref 0.1–0.9)
MONO%: 4.3 % (ref 0.0–14.0)
NEUT%: 74.7 % (ref 39.0–75.0)
NEUTROS ABS: 4.9 10*3/uL (ref 1.5–6.5)
Platelets: 98 10*3/uL — ABNORMAL LOW (ref 140–400)
RBC: 3.06 10*6/uL — ABNORMAL LOW (ref 4.20–5.82)
RDW: 16.3 % — AB (ref 11.0–14.6)
WBC: 6.5 10*3/uL (ref 4.0–10.3)

## 2015-04-27 LAB — COMPREHENSIVE METABOLIC PANEL (CC13)
ALK PHOS: 129 U/L (ref 40–150)
ALT: 24 U/L (ref 0–55)
AST: 52 U/L — ABNORMAL HIGH (ref 5–34)
Albumin: 3.5 g/dL (ref 3.5–5.0)
Anion Gap: 8 mEq/L (ref 3–11)
BILIRUBIN TOTAL: 0.92 mg/dL (ref 0.20–1.20)
BUN: 13.9 mg/dL (ref 7.0–26.0)
CALCIUM: 9.3 mg/dL (ref 8.4–10.4)
CO2: 26 mEq/L (ref 22–29)
Chloride: 104 mEq/L (ref 98–109)
Creatinine: 1 mg/dL (ref 0.7–1.3)
EGFR: 78 mL/min/{1.73_m2} — AB (ref >90)
GLUCOSE: 148 mg/dL — AB (ref 70–140)
POTASSIUM: 4.3 meq/L (ref 3.5–5.1)
SODIUM: 138 meq/L (ref 136–145)
TOTAL PROTEIN: 8 g/dL (ref 6.4–8.3)

## 2015-04-28 LAB — PSA: PSA: 0.02 ng/mL (ref <=4.00)

## 2015-04-29 ENCOUNTER — Telehealth: Payer: Self-pay | Admitting: Oncology

## 2015-04-29 ENCOUNTER — Ambulatory Visit (HOSPITAL_BASED_OUTPATIENT_CLINIC_OR_DEPARTMENT_OTHER): Payer: Medicare Other | Admitting: Oncology

## 2015-04-29 VITALS — BP 125/72 | HR 92 | Temp 99.2°F | Resp 18 | Ht 74.0 in | Wt 212.0 lb

## 2015-04-29 DIAGNOSIS — C61 Malignant neoplasm of prostate: Secondary | ICD-10-CM | POA: Diagnosis not present

## 2015-04-29 DIAGNOSIS — Z7951 Long term (current) use of inhaled steroids: Secondary | ICD-10-CM

## 2015-04-29 NOTE — Telephone Encounter (Signed)
per pof to sch pt appt-gave pt copy of avs °

## 2015-04-29 NOTE — Progress Notes (Signed)
Hematology and Oncology Follow Up Visit  Allen Schroeder [id number] 1950-10-12 64 y.o. 04/29/2015 10:17 AM [name], MDPerini, [name], MD   Principle Diagnosis: 64 year old gentleman with castration resistant prostate cancer with metastatic disease to the bone. He was initially diagnosed in October of 2009 with bony metastasis.   Prior Therapy: He was treated with hormonal therapy with monthly Fermagon injections and monthly Zometa infusions. PSA fell from 72.9 in October 2009 to a nadir of 0.24 by June of 2012.  PSA began to rise again and was 4.09 on 02/06/2012. Bone scan at that point did not show any obvious new lesions. He was started on Xtandi and once again has had a very nice response since.   Current therapy: Xtandi 160 mg daily started September of 2013 with an excellent response PSA is close to 0. Has been on hold since 11/2014 due to liver disease.   Interim History:  Allen Schroeder presents today for a followup visit. Since the last visit, he reports no complaints. He continues to hold xtandi and have not had any complaints to suggest cancer recurrence. He has resumed work-related duties without any decline. Is able to drive and attends activities of daily living.    His ascites has resolved no longer reporting encephalopathy. He has not had any falls or syncope. He does report intermittent neuropathy which has not changed. Has not reported any nausea or vomiting or abdominal pain. Has not reported any seizures. He has reported increased unsteadiness and increase in his abdominal girth and early satiety. He is able to eat and have gained weight since the last visit.   He reports no headaches, blurry vision or other neurological symptoms. He has not reported any back pain shoulder pain or pathological fractures. Has not reported any lower extremity edema or recent hospitalization. He does not report any chest pain, palpitation. He does not report any cough or hemoptysis. He does not report  any nausea, vomiting, constipation or hematochezia. Has not reported any genitourinary complaints, petechiae or lymphadenopathy.  The rest of his review of systems unremarkable.   Medications: I have reviewed the patient's current medications.  Current Outpatient Prescriptions  Medication Sig Dispense Refill  . CALCIUM CARB-CHOLECALCIFEROL PO Take 1 capsule by mouth 2 (two) times daily.     . clopidogrel (PLAVIX) 75 MG tablet TAKE ONE TABLET BY MOUTH ONCE DAILY (Patient taking differently: Take 75 mg by mouth daily. ) 30 tablet 11  . fentaNYL (DURAGESIC - DOSED MCG/HR) 12 MCG/HR Place 12 mcg onto the skin every 3 (three) days.  0  . folic acid (FOLVITE) 1 MG tablet Take one tablet by mouth once a day.... (Patient taking differently: Take 1 mg by mouth daily. ) 30 tablet 6  . furosemide (LASIX) 20 MG tablet Take 0.5 tablets (10 mg total) by mouth daily. 30 tablet 1  . lactulose (CHRONULAC) 10 GM/15ML solution Take 22.5 mLs (15 g total) by mouth 3 (three) times daily. (Patient taking differently: Take 15 g by mouth 2 (two) times daily. ) 240 mL 0  . Multiple Vitamin (MULTIVITAMIN) tablet Take 1 tablet by mouth daily.      [name] oxyCODONE (OXY IR/ROXICODONE) 5 MG immediate release tablet Take 0.5-1 tablets (2.5-5 mg total) by mouth every 6 (six) hours as needed for moderate pain or severe pain. (Patient taking differently: Take 2.5-5 mg by mouth at bedtime as needed for moderate pain or severe pain. )    . Probiotic Product (ALIGN) 4 MG CAPS Take 1  capsule by mouth daily.    . rifaximin (XIFAXAN) 550 MG TABS tablet Take 1 tablet (550 mg total) by mouth 2 (two) times daily. 60 tablet 0  . spironolactone (ALDACTONE) 25 MG tablet Take 1 tablet (25 mg total) by mouth daily. 30 tablet 1  . sulfamethoxazole-trimethoprim (BACTRIM DS,SEPTRA DS) 800-160 MG per tablet Take 1 tablet by mouth daily.    [name] thiamine 100 MG tablet Take 1 tablet (100 mg total) by mouth daily. 30 tablet 0  . UNKNOWN TO PATIENT Slow  release hormone implant placed 8/15 by Dr. Willette Cluster.  Good for one year    . Vitamins-Lipotropics ([employer] BALANCED B-100) TABS Take 1 tablet by mouth daily.  5   No current facility-administered medications for this visit.     Allergies: No Known Allergies  Past Medical History, Surgical history, Social history, and Family History were reviewed and updated.   Physical Exam: Blood pressure 125/72, pulse 92, temperature 99.2 F (37.3 C), temperature source Oral, resp. rate 18, height 6\' 2"  (1.88 m), weight 212 lb (96.163 kg), SpO2 98 %. ECOG: 1 General appearance: alert and cooperative appeared in no distress. Head: Normocephalic, without obvious abnormality, atraumatic  Sclera are clear. Neck: no adenopathy Lymph nodes: Cervical, supraclavicular, and axillary nodes normal. Heart:regular rate and rhythm, S1, S2 normal, no murmur, click, rub or gallop Lung:chest clear, no wheezing, rales, normal symmetric air entry Abdomin: soft, non-tender, without masses or organomegaly. No shifting dullness or ascites noted. EXT:no erythema, induration, or nodules Neurological examination showed no deficits.  Lab Results: Lab Results  Component Value Date   WBC 6.5 04/27/2015   HGB 10.9* 04/27/2015   HCT 32.2* 04/27/2015   MCV 105.3* 04/27/2015   PLT 98* 04/27/2015     Chemistry      Component Value Date/Time   NA 138 04/27/2015 1439   NA 133* 12/27/2014 0527   K 4.3 04/27/2015 1439   K 3.9 12/27/2014 0527   CL 102 12/27/2014 0527   CL 100 12/10/2012 1540   CO2 26 04/27/2015 1439   CO2 25 12/27/2014 0527   BUN 13.9 04/27/2015 1439   BUN 8 12/27/2014 0527   CREATININE 1.0 04/27/2015 1439   CREATININE 0.71 12/27/2014 0527      Component Value Date/Time   CALCIUM 9.3 04/27/2015 1439   CALCIUM 7.6* 12/27/2014 0527   ALKPHOS 129 04/27/2015 1439   ALKPHOS 127* 12/26/2014 0527   AST 52* 04/27/2015 1439   AST 83* 12/26/2014 0527   ALT 24 04/27/2015 1439   ALT 34 12/26/2014 0527    BILITOT 0.92 04/27/2015 1439   BILITOT 2.7* 12/26/2014 0527      Results for Allen Schroeder (MRN [id number]) as of 04/29/2015 10:03  Ref. Range 01/26/2015 12:42 03/09/2015 10:28 04/27/2015 14:39  PSA Latest Ref Range: <=4.00 ng/mL <0.01 0.02 0.02       Impression and Plan:  64 year old on with the following issues:  1. Castration resistant prostate cancer with metastatic disease to the bone. His continued on Xtandi with excellent response and a PSA of less than 0.01. I do not think his Gillermina Phy is contributing to his jaundice and his hepatic failure. His PSA was discussed today and continues to be very low at this time.  Risks and benefits of continuing Xtandi versus continue to hold were discussed. I do not think xtandi has any hepatic effects but given his PSA continues to be under excellent control I see no reason to start it at this time.  After discussion today, we have elected to continue to hold Xtandi and monitor his PSA closely. In his PSA starts to rise, we will restart at that time.  2. Jaundice related to alcoholic liver cirrhosis: His bilirubin has normalized at this time. His liver function test all improved including ALT as well. His AST still slightly elevated at this time but much improved compared to previously.  3. Androgen deprivation: He is currently on androgen depravation. I recommend this continues for the time being.   4. Followup: Will be in 6 weeks for a recheck of his PSA at that time.  [facility] [place], MD 9/22/201610:17 AM

## 2015-04-29 NOTE — Addendum Note (Signed)
Addended by: Randolm Idol on: 04/29/2015 10:43 AM   Modules accepted: Orders, Medications

## 2015-05-13 DIAGNOSIS — I8312 Varicose veins of left lower extremity with inflammation: Secondary | ICD-10-CM | POA: Diagnosis not present

## 2015-05-13 DIAGNOSIS — D2261 Melanocytic nevi of right upper limb, including shoulder: Secondary | ICD-10-CM | POA: Diagnosis not present

## 2015-05-13 DIAGNOSIS — D485 Neoplasm of uncertain behavior of skin: Secondary | ICD-10-CM | POA: Diagnosis not present

## 2015-05-13 DIAGNOSIS — I8311 Varicose veins of right lower extremity with inflammation: Secondary | ICD-10-CM | POA: Diagnosis not present

## 2015-05-13 DIAGNOSIS — D225 Melanocytic nevi of trunk: Secondary | ICD-10-CM | POA: Diagnosis not present

## 2015-05-20 DIAGNOSIS — D485 Neoplasm of uncertain behavior of skin: Secondary | ICD-10-CM | POA: Diagnosis not present

## 2015-05-20 DIAGNOSIS — D225 Melanocytic nevi of trunk: Secondary | ICD-10-CM | POA: Diagnosis not present

## 2015-05-20 DIAGNOSIS — D2262 Melanocytic nevi of left upper limb, including shoulder: Secondary | ICD-10-CM | POA: Diagnosis not present

## 2015-06-23 ENCOUNTER — Other Ambulatory Visit (HOSPITAL_BASED_OUTPATIENT_CLINIC_OR_DEPARTMENT_OTHER): Payer: Medicare Other

## 2015-06-23 DIAGNOSIS — C61 Malignant neoplasm of prostate: Secondary | ICD-10-CM | POA: Diagnosis not present

## 2015-06-23 DIAGNOSIS — C801 Malignant (primary) neoplasm, unspecified: Secondary | ICD-10-CM | POA: Diagnosis not present

## 2015-06-23 LAB — CBC WITH DIFFERENTIAL/PLATELET
BASO%: 0.8 % (ref 0.0–2.0)
Basophils Absolute: 0 10*3/uL (ref 0.0–0.1)
EOS%: 4.1 % (ref 0.0–7.0)
Eosinophils Absolute: 0.2 10*3/uL (ref 0.0–0.5)
HEMATOCRIT: 32.5 % — AB (ref 38.4–49.9)
HEMOGLOBIN: 10.8 g/dL — AB (ref 13.0–17.1)
LYMPH#: 1.1 10*3/uL (ref 0.9–3.3)
LYMPH%: 18.6 % (ref 14.0–49.0)
MCH: 34.8 pg — ABNORMAL HIGH (ref 27.2–33.4)
MCHC: 33.3 g/dL (ref 32.0–36.0)
MCV: 104.8 fL — ABNORMAL HIGH (ref 79.3–98.0)
MONO#: 0.3 10*3/uL (ref 0.1–0.9)
MONO%: 4.3 % (ref 0.0–14.0)
NEUT%: 72.2 % (ref 39.0–75.0)
NEUTROS ABS: 4.3 10*3/uL (ref 1.5–6.5)
Platelets: 104 10*3/uL — ABNORMAL LOW (ref 140–400)
RBC: 3.1 10*6/uL — ABNORMAL LOW (ref 4.20–5.82)
RDW: 16.7 % — AB (ref 11.0–14.6)
WBC: 6 10*3/uL (ref 4.0–10.3)

## 2015-06-23 LAB — COMPREHENSIVE METABOLIC PANEL (CC13)
ALBUMIN: 3.3 g/dL — AB (ref 3.5–5.0)
ALT: 26 U/L (ref 0–55)
AST: 71 U/L — AB (ref 5–34)
Alkaline Phosphatase: 152 U/L — ABNORMAL HIGH (ref 40–150)
Anion Gap: 9 mEq/L (ref 3–11)
BUN: 10.7 mg/dL (ref 7.0–26.0)
CHLORIDE: 101 meq/L (ref 98–109)
CO2: 25 mEq/L (ref 22–29)
Calcium: 9.8 mg/dL (ref 8.4–10.4)
Creatinine: 1 mg/dL (ref 0.7–1.3)
EGFR: 75 mL/min/{1.73_m2} — ABNORMAL LOW (ref >90)
Glucose: 123 mg/dl (ref 70–140)
Potassium: 3.9 mEq/L (ref 3.5–5.1)
Sodium: 135 mEq/L — ABNORMAL LOW (ref 136–145)
Total Bilirubin: 1.18 mg/dL (ref 0.20–1.20)
Total Protein: 8.5 g/dL — ABNORMAL HIGH (ref 6.4–8.3)

## 2015-06-24 LAB — PSA: PSA: 0.09 ng/mL (ref <=4.00)

## 2015-06-25 ENCOUNTER — Encounter: Payer: Self-pay | Admitting: Oncology

## 2015-06-25 ENCOUNTER — Ambulatory Visit (HOSPITAL_BASED_OUTPATIENT_CLINIC_OR_DEPARTMENT_OTHER): Payer: Medicare Other | Admitting: Oncology

## 2015-06-25 ENCOUNTER — Other Ambulatory Visit: Payer: Self-pay | Admitting: *Deleted

## 2015-06-25 ENCOUNTER — Telehealth: Payer: Self-pay | Admitting: Oncology

## 2015-06-25 VITALS — BP 120/59 | HR 89 | Temp 98.1°F | Resp 18 | Ht 74.0 in | Wt 223.8 lb

## 2015-06-25 DIAGNOSIS — C61 Malignant neoplasm of prostate: Secondary | ICD-10-CM | POA: Diagnosis not present

## 2015-06-25 DIAGNOSIS — C7951 Secondary malignant neoplasm of bone: Secondary | ICD-10-CM | POA: Diagnosis not present

## 2015-06-25 MED ORDER — ENZALUTAMIDE 40 MG PO CAPS: 160.0000 mg | ORAL_CAPSULE | Freq: Every day | ORAL | Status: DC

## 2015-06-25 NOTE — Telephone Encounter (Signed)
Gave and printed appt sched and avs for pt for DEc °

## 2015-06-25 NOTE — Progress Notes (Signed)
Hematology and Oncology Follow Up Visit  Allen FARO [id number] 1950/12/07 64 y.o. 06/25/2015 3:15 PM [name], MDPerini, [name], MD   Principle Diagnosis: 64 year old gentleman with castration resistant prostate cancer with metastatic disease to the bone. He was initially diagnosed in October of 2009 with bony metastasis.   Prior Therapy: He was treated with hormonal therapy with monthly Fermagon injections and monthly Zometa infusions. PSA fell from 72.9 in October 2009 to a nadir of 0.24 by June of 2012.  PSA began to rise again and was 4.09 on 02/06/2012. Bone scan at that point did not show any obvious new lesions. He was started on Xtandi and once again has had a very nice response since.   Current therapy: Xtandi 160 mg daily started September of 2013 with an excellent response PSA is close to 0. Has been on hold since 11/2014 due to liver disease. This will be resumed tentatively on 07/08/2015.  Interim History:  Allen Schroeder presents today for a followup visit. Since the last visit, he continues to do well and fully recover from his recent illness.  He continues to hold xtandi and have not had any complaints to suggest cancer recurrence. He has resumed work-related duties without any decline. He has not reported any jaundice or encephalopathy. Has not had any recent hospitalization or illnesses. He has not reported any falls or syncope.  He reports no headaches, blurry vision or other neurological symptoms. He has not reported any back pain shoulder pain or pathological fractures. Has not reported any lower extremity edema or recent hospitalization. He does not report any chest pain, palpitation. He does not report any cough or hemoptysis. He does not report any nausea, vomiting, constipation or hematochezia. Has not reported any genitourinary complaints, petechiae or lymphadenopathy.  The rest of his review of systems unremarkable.   Medications: I have reviewed the patient's current  medications.  Current Outpatient Prescriptions  Medication Sig Dispense Refill  . CALCIUM CARB-CHOLECALCIFEROL PO Take 1 capsule by mouth 2 (two) times daily.     . clopidogrel (PLAVIX) 75 MG tablet TAKE ONE TABLET BY MOUTH ONCE DAILY (Patient taking differently: Take 75 mg by mouth daily. ) 30 tablet 11  . folic acid (FOLVITE) 1 MG tablet Take one tablet by mouth once a day.... (Patient taking differently: Take 1 mg by mouth daily. ) 30 tablet 6  . furosemide (LASIX) 20 MG tablet Take 0.5 tablets (10 mg total) by mouth daily. 30 tablet 1  . lactulose (CHRONULAC) 10 GM/15ML solution Take 10 g by mouth 2 (two) times daily as needed for mild constipation.    . Multiple Vitamin (MULTIVITAMIN) tablet Take 1 tablet by mouth daily.      [name] oxyCODONE (OXY IR/ROXICODONE) 5 MG immediate release tablet Take 0.5-1 tablets (2.5-5 mg total) by mouth every 6 (six) hours as needed for moderate pain or severe pain. (Patient taking differently: Take 2.5-5 mg by mouth at bedtime as needed for moderate pain or severe pain. )    . Probiotic Product (ALIGN) 4 MG CAPS Take 1 capsule by mouth daily.    . rifaximin (XIFAXAN) 550 MG TABS tablet Take 1 tablet (550 mg total) by mouth 2 (two) times daily. 60 tablet 0  . spironolactone (ALDACTONE) 25 MG tablet Take 1 tablet (25 mg total) by mouth daily. 30 tablet 1  . sulfamethoxazole-trimethoprim (BACTRIM DS,SEPTRA DS) 800-160 MG per tablet Take 1 tablet by mouth daily.    [name] thiamine 100 MG tablet Take 1 tablet (  100 mg total) by mouth daily. 30 tablet 0  . UNKNOWN TO PATIENT Slow release hormone implant placed 04/21/15 by Dr. Willette Cluster.  Good for one year    . enzalutamide (XTANDI) 40 MG capsule Take 4 capsules (160 mg total) by mouth daily. 120 capsule 0   No current facility-administered medications for this visit.     Allergies: No Known Allergies  Past Medical History, Surgical history, Social history, and Family History were reviewed and updated.   Physical  Exam: Blood pressure 120/59, pulse 89, temperature 98.1 F (36.7 C), temperature source Oral, resp. rate 18, height 6\' 2"  (1.88 m), weight 223 lb 12.8 oz (101.515 kg), SpO2 98 %. ECOG: 1 General appearance: alert and cooperative appeared in no distress. Head: Normocephalic, without obvious abnormality no oral ulcers or lesions  Sclera are clear. Neck: no adenopathy Lymph nodes: Cervical, supraclavicular, and axillary nodes normal. Heart:regular rate and rhythm, S1, S2 normal, no murmur, click, rub or gallop Lung:chest clear, no wheezing, rales, normal symmetric air entry Abdomin: soft, non-tender, without masses or organomegaly. No shifting dullness or ascites noted. EXT:no erythema, induration, or nodules Neurological examination showed no deficits.  Lab Results: Lab Results  Component Value Date   WBC 6.0 06/23/2015   HGB 10.8* 06/23/2015   HCT 32.5* 06/23/2015   MCV 104.8* 06/23/2015   PLT 104* 06/23/2015     Chemistry      Component Value Date/Time   NA 135* 06/23/2015 1433   NA 133* 12/27/2014 0527   K 3.9 06/23/2015 1433   K 3.9 12/27/2014 0527   CL 102 12/27/2014 0527   CL 100 12/10/2012 1540   CO2 25 06/23/2015 1433   CO2 25 12/27/2014 0527   BUN 10.7 06/23/2015 1433   BUN 8 12/27/2014 0527   CREATININE 1.0 06/23/2015 1433   CREATININE 0.71 12/27/2014 0527      Component Value Date/Time   CALCIUM 9.8 06/23/2015 1433   CALCIUM 7.6* 12/27/2014 0527   ALKPHOS 152* 06/23/2015 1433   ALKPHOS 127* 12/26/2014 0527   AST 71* 06/23/2015 1433   AST 83* 12/26/2014 0527   ALT 26 06/23/2015 1433   ALT 34 12/26/2014 0527   BILITOT 1.18 06/23/2015 1433   BILITOT 2.7* 12/26/2014 0527       Results for MERVIL, MROTEK (MRN [id number]) as of 06/25/2015 15:04  Ref. Range 03/09/2015 10:28 04/27/2015 14:39 06/23/2015 14:33  PSA Latest Ref Range: <=4.00 ng/mL 0.02 0.02 0.09       Impression and Plan:  64 year old on with the following issues:  1. Castration resistant  prostate cancer with metastatic disease to the bone. His continued on Xtandi with excellent response and a PSA of less than 0.01. I do not think his Gillermina Phy is contributing to his jaundice and his hepatic failure.   His PSA have started to rise slightly up to 0.09 from 0.02.  Risks and benefits of continuing Xtandi versus continue to hold were reviewed again. And after discussion today he prefers to restart xtandi and I certainly agree with that. Complications associated with this medication were reviewed again and he is ready to resume. He will likely start this medication next week after his upcoming travel for [place] holiday.  2. Jaundice related to alcoholic liver cirrhosis: His bilirubin has normalized at this time.  3. Androgen deprivation: He is currently on androgen depravation. I recommend this continues for the time being.   4. Followup: Will be in 6 weeks for a recheck of his PSA  at that time.  Zola Button, MD 11/18/20163:15 PM

## 2015-06-25 NOTE — Progress Notes (Signed)
Faxed xtandi pa form to [facility] °

## 2015-07-05 ENCOUNTER — Encounter: Payer: Self-pay | Admitting: Pharmacist

## 2015-07-05 NOTE — Progress Notes (Signed)
07/05/15 - [facility] approved Xtandi through 06/24/2016. Will check with [facility] for cost/benefit analysis

## 2015-07-14 DIAGNOSIS — R188 Other ascites: Secondary | ICD-10-CM | POA: Diagnosis not present

## 2015-07-14 DIAGNOSIS — I10 Essential (primary) hypertension: Secondary | ICD-10-CM | POA: Diagnosis not present

## 2015-07-14 DIAGNOSIS — Z6828 Body mass index (BMI) 28.0-28.9, adult: Secondary | ICD-10-CM | POA: Diagnosis not present

## 2015-07-14 DIAGNOSIS — C61 Malignant neoplasm of prostate: Secondary | ICD-10-CM | POA: Diagnosis not present

## 2015-07-14 DIAGNOSIS — I4891 Unspecified atrial fibrillation: Secondary | ICD-10-CM | POA: Diagnosis not present

## 2015-07-14 DIAGNOSIS — K7030 Alcoholic cirrhosis of liver without ascites: Secondary | ICD-10-CM | POA: Diagnosis not present

## 2015-08-03 ENCOUNTER — Other Ambulatory Visit (HOSPITAL_BASED_OUTPATIENT_CLINIC_OR_DEPARTMENT_OTHER): Payer: Medicare Other

## 2015-08-03 DIAGNOSIS — C61 Malignant neoplasm of prostate: Secondary | ICD-10-CM

## 2015-08-03 DIAGNOSIS — C801 Malignant (primary) neoplasm, unspecified: Secondary | ICD-10-CM | POA: Diagnosis not present

## 2015-08-03 LAB — CBC WITH DIFFERENTIAL/PLATELET
BASO%: 1 % (ref 0.0–2.0)
BASOS ABS: 0.1 10*3/uL (ref 0.0–0.1)
EOS%: 4.9 % (ref 0.0–7.0)
Eosinophils Absolute: 0.3 10*3/uL (ref 0.0–0.5)
HEMATOCRIT: 31.2 % — AB (ref 38.4–49.9)
HGB: 10.3 g/dL — ABNORMAL LOW (ref 13.0–17.1)
LYMPH#: 1 10*3/uL (ref 0.9–3.3)
LYMPH%: 16.1 % (ref 14.0–49.0)
MCH: 35.4 pg — AB (ref 27.2–33.4)
MCHC: 33.1 g/dL (ref 32.0–36.0)
MCV: 106.8 fL — ABNORMAL HIGH (ref 79.3–98.0)
MONO#: 0.4 10*3/uL (ref 0.1–0.9)
MONO%: 6.8 % (ref 0.0–14.0)
NEUT#: 4.3 10*3/uL (ref 1.5–6.5)
NEUT%: 71.2 % (ref 39.0–75.0)
PLATELETS: 92 10*3/uL — AB (ref 140–400)
RBC: 2.92 10*6/uL — AB (ref 4.20–5.82)
RDW: 16.6 % — ABNORMAL HIGH (ref 11.0–14.6)
WBC: 6 10*3/uL (ref 4.0–10.3)

## 2015-08-03 LAB — COMPREHENSIVE METABOLIC PANEL
ALT: 27 U/L (ref 0–55)
ANION GAP: 10 meq/L (ref 3–11)
AST: 86 U/L — ABNORMAL HIGH (ref 5–34)
Albumin: 3.2 g/dL — ABNORMAL LOW (ref 3.5–5.0)
Alkaline Phosphatase: 162 U/L — ABNORMAL HIGH (ref 40–150)
BUN: 8.9 mg/dL (ref 7.0–26.0)
CALCIUM: 8.1 mg/dL — AB (ref 8.4–10.4)
CHLORIDE: 106 meq/L (ref 98–109)
CO2: 21 meq/L — AB (ref 22–29)
Creatinine: 0.9 mg/dL (ref 0.7–1.3)
EGFR: 84 mL/min/{1.73_m2} — AB (ref >90)
Glucose: 95 mg/dl (ref 70–140)
POTASSIUM: 3.7 meq/L (ref 3.5–5.1)
Sodium: 136 mEq/L (ref 136–145)
Total Bilirubin: 1.19 mg/dL (ref 0.20–1.20)
Total Protein: 8.4 g/dL — ABNORMAL HIGH (ref 6.4–8.3)

## 2015-08-04 LAB — PSA: PSA: 0.08 ng/mL (ref <=4.00)

## 2015-08-05 ENCOUNTER — Telehealth: Payer: Self-pay | Admitting: Oncology

## 2015-08-05 ENCOUNTER — Ambulatory Visit (HOSPITAL_BASED_OUTPATIENT_CLINIC_OR_DEPARTMENT_OTHER): Payer: Medicare Other | Admitting: Oncology

## 2015-08-05 ENCOUNTER — Ambulatory Visit: Payer: Medicare Other | Admitting: Cardiovascular Disease

## 2015-08-05 VITALS — BP 125/69 | HR 84 | Temp 98.3°F | Resp 18 | Ht 74.0 in | Wt 230.0 lb

## 2015-08-05 DIAGNOSIS — C61 Malignant neoplasm of prostate: Secondary | ICD-10-CM | POA: Diagnosis not present

## 2015-08-05 DIAGNOSIS — Z7951 Long term (current) use of inhaled steroids: Secondary | ICD-10-CM

## 2015-08-05 NOTE — Telephone Encounter (Signed)
Schedule patient appt per pof. AVS report printed

## 2015-08-05 NOTE — Progress Notes (Signed)
Hematology and Oncology Follow Up Visit  Allen Schroeder [id number] May 06, 1951 64 y.o. 08/05/2015 12:21 PM Allen Schroeder, MDPerini, [name], MD   Principle Diagnosis: 64 year old gentleman with castration resistant prostate cancer with metastatic disease to the bone. He was initially diagnosed in October of 2009 with bony metastasis.   Prior Therapy: He was treated with hormonal therapy with monthly Fermagon injections and monthly Zometa infusions. PSA fell from 72.9 in October 2009 to a nadir of 0.24 by June of 2012.  PSA began to rise again and was 4.09 on 02/06/2012. Bone scan at that point did not show any obvious new lesions. He was started on Xtandi and once again has had a very nice response since.   Current therapy: Xtandi 160 mg daily started September of 2013 with an excellent response PSA is close to 0. Has been on hold since 11/2014 due to liver disease. This was resumed tentatively around 07/08/2015.  Interim History:  Allen Schroeder presents today for a followup visit. Since the last visit, he started xtandi and tolerated it without any new side effects. he does report some mild fatigue and occasional hot flashes but no other side effects. He continues to perform work-related duties without any decline. He has not reported any jaundice or encephalopathy. Has not had any recent hospitalization or illnesses. He has not reported any falls or syncope. He does use a cane for ambulation because of history of sciatica which has been chronic in nature.  He reports no headaches, blurry vision or other neurological symptoms. He has not reported any back pain shoulder pain or pathological fractures. Has not reported any lower extremity edema or recent hospitalization. He does not report any chest pain, palpitation. He does not report any cough or hemoptysis. He does not report any nausea, vomiting, constipation or hematochezia. Has not reported any genitourinary complaints, petechiae or lymphadenopathy.   The rest of his review of systems unremarkable.   Medications: I have reviewed the patient's current medications.  Current Outpatient Prescriptions  Medication Sig Dispense Refill  . CALCIUM CARB-CHOLECALCIFEROL PO Take 1 capsule by mouth 2 (two) times daily.     . clopidogrel (PLAVIX) 75 MG tablet TAKE ONE TABLET BY MOUTH ONCE DAILY (Patient taking differently: Take 75 mg by mouth daily. ) 30 tablet 11  . enzalutamide (XTANDI) 40 MG capsule Take 4 capsules (160 mg total) by mouth daily. [id number] capsule 0  . folic acid (FOLVITE) 1 MG tablet Take one tablet by mouth once a day.... (Patient taking differently: Take 1 mg by mouth daily. ) 30 tablet 6  . furosemide (LASIX) 20 MG tablet Take 0.5 tablets (10 mg total) by mouth daily. 30 tablet 1  . lactulose (CHRONULAC) 10 GM/15ML solution Take 10 g by mouth 2 (two) times daily as needed for mild constipation.    . Multiple Vitamin (MULTIVITAMIN) tablet Take 1 tablet by mouth daily.      [name] oxyCODONE (OXY IR/ROXICODONE) 5 MG immediate release tablet Take 0.5-1 tablets (2.5-5 mg total) by mouth every 6 (six) hours as needed for moderate pain or severe pain. (Patient taking differently: Take 2.5-5 mg by mouth at bedtime as needed for moderate pain or severe pain. )    . Probiotic Product (ALIGN) 4 MG CAPS Take 1 capsule by mouth daily.    . rifaximin (XIFAXAN) 550 MG TABS tablet Take 1 tablet (550 mg total) by mouth 2 (two) times daily. 60 tablet 0  . spironolactone (ALDACTONE) 25 MG tablet Take 1 tablet (25  mg total) by mouth daily. 30 tablet 1  . sulfamethoxazole-trimethoprim (BACTRIM DS,SEPTRA DS) 800-160 MG per tablet Take 1 tablet by mouth daily.    [name] thiamine 100 MG tablet Take 1 tablet (100 mg total) by mouth daily. 30 tablet 0   No current facility-administered medications for this visit.     Allergies: No Known Allergies  Past Medical History, Surgical history, Social history, and Family History were reviewed and updated.   Physical  Exam: Blood pressure 125/69, pulse 84, temperature 98.3 F (36.8 C), temperature source Oral, resp. rate 18, height 6\' 2"  (1.88 m), weight 230 lb (104.327 kg), SpO2 99 %. ECOG: 1 General appearance: alert and cooperative  Well-appearing gentleman. Head: Normocephalic, without obvious abnormality no oral ulcers or lesions  Sclera  Anicteric. Neck: no adenopathy no thyromegaly. Lymph nodes: Cervical, supraclavicular, and axillary nodes normal. Heart:regular rate and rhythm, S1, S2 normal, no murmur, click, rub or gallop Lung:chest clear, no wheezing, rales, normal symmetric air entry Abdomin: soft, non-tender, without masses or organomegaly. No  Rebound or guarding. No ascites noted. EXT:no erythema, induration, or nodules Neurological examination showed no deficits. Able to ambulate without difficulties.`  Lab Results: Lab Results  Component Value Date   WBC 6.0 08/03/2015   HGB 10.3* 08/03/2015   HCT 31.2* 08/03/2015   MCV 106.8* 08/03/2015   PLT 92* 08/03/2015     Chemistry      Component Value Date/Time   NA 136 08/03/2015 1502   NA 133* 12/27/2014 0527   K 3.7 08/03/2015 1502   K 3.9 12/27/2014 0527   CL 102 12/27/2014 0527   CL 100 12/10/2012 1540   CO2 21* 08/03/2015 1502   CO2 25 12/27/2014 0527   BUN 8.9 08/03/2015 1502   BUN 8 12/27/2014 0527   CREATININE 0.9 08/03/2015 1502   CREATININE 0.71 12/27/2014 0527      Component Value Date/Time   CALCIUM 8.1* 08/03/2015 1502   CALCIUM 7.6* 12/27/2014 0527   ALKPHOS 162* 08/03/2015 1502   ALKPHOS 127* 12/26/2014 0527   AST 86* 08/03/2015 1502   AST 83* 12/26/2014 0527   ALT 27 08/03/2015 1502   ALT 34 12/26/2014 0527   BILITOT 1.19 08/03/2015 1502   BILITOT 2.7* 12/26/2014 0527      Results for Allen Schroeder (MRN [id number]) as of 08/05/2015 12:06  Ref. Range 04/27/2015 14:39 06/23/2015 14:33 08/03/2015 15:02  PSA Latest Ref Range: <=4.00 ng/mL 0.02 0.09 0.08        Impression and Plan:  64 year old  on with the following issues:  1. Castration resistant prostate cancer with metastatic disease to the bone. His continued on Xtandi with excellent response and a PSA of less than 0.01. I do not think his Gillermina Phy is contributing to his jaundice and his hepatic failure.   His PSA did rise up to 0.9 and xtandi was resumed in December 2016 and currently PSA down to 0.08. He has tolerated this medication well without any new side effects and the current side effects are manageable. His liver function tests appear at baseline without decompensation. I have recommended continuing the same dose and schedule for the time being.   2. Jaundice related to alcoholic liver cirrhosis: His bilirubin has normalized at this time.  3. Androgen deprivation: He is currently on androgen depravation. I recommend this continues for the time being.   4. Followup: Will be in 8 weeks for a recheck of his PSA at that time.  [facility], MD 12/29/201612:21 PM

## 2015-08-15 NOTE — Progress Notes (Signed)
Cardiology Office Note    Date:  08/16/2015   ID:  Allen Schroeder, DOB 04/09/1951, MRN [id number]  PCP:  Allen Ly, MD  Cardiologist:  Dr. Sherren Schroeder   Electrophysiologist:  n/a  Chief Complaint  Patient presents with  . Follow-up    pt has no complaints  . Coronary Artery Disease  . Atrial Fibrillation    History of Present Illness:  Allen Schroeder is a 65 y.o. male with a hx of CAD status post prior PCI of the LAD in 2011, PAF, metastatic prostate cancer, alcoholism with associated alcohol-related chronic liver disease. He is not a candidate for anticoagulation. He has maintained sinus rhythm on sotalol. Last seen by Dr. Burt Schroeder 11/15.  Admitted 5/16 with Escherichia coli sepsis. Of note, sotalol was stopped at that time secondary to hypotension. We have not seen him since that time and he has remained off of sotalol.  He comes in today for routine FU and is in recurrent atrial fibrillation with an HR 147.  He is asymptomatic.  He denies dyspnea, dizziness, fatigue.  He denies chest pain.  He denies syncope.  He denies orthopnea, PND, edema.      Past Medical History  Diagnosis Date  . Atrial fibrillation ([place])   . Other and unspecified hyperlipidemia   . Unspecified essential hypertension   . Malignant neoplasm of prostate ([place]) 11/2007    Mets to Bone  . Elevated prostate specific antigen (PSA)   . Personal history of colonic polyps   . Hypocalcemia   . Acute alcoholic hepatitis   . Essential and other specified forms of tremor   . Anxiety state, unspecified   . Family history of malignant neoplasm of gastrointestinal tract   . Alcohol abuse with physiological dependence ([place]) 02/13/2012  . 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 Alcoholic hepatitis with ascites 11/13/2014  . Bacteremia, escherichia coli     Past Surgical History  Procedure Laterality Date  . Vasectomy      . Cholecystectomy    . Coronary stent placement  2011  . Colonoscopy w/ biopsies      No current outpatient prescriptions on file.   No current facility-administered medications for this visit.    Allergies:   Review of patient's allergies indicates no known allergies.   Social History   Social History  . Marital Status: Married    Spouse Name: N/A  . Number of Children: 3  . Years of Education: N/A   Occupational History  . lawyer     VP of [employer] History Main Topics  . Smoking status: Former Smoker    Types: Cigarettes  . Smokeless tobacco: Never Used     Comment: stopped in college  . Alcohol Use: Yes     Comment: has consumed as much as a 1/2 gallon of vodka in a day, now drinks at least 3-5 drinks per day.   . Drug Use: No  . Sexual Activity: No   Other Topics Concern  . None   Social History Narrative   Patient is married. 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 He has two sons, one daughter and 5 grandchildren. Both sons work in the business. Daughter lives  in [place]. Close knit family.    11/18/2014        Family History:  The patient's family history includes Aneurysm in his mother; Colon cancer in his father; Coronary artery disease in his father; Prostate cancer in his father.   ROS:   Please see the history of present illness.    Review of Systems  Constitution: Negative for chills and fever.  Respiratory: Negative for cough.   Hematologic/Lymphatic: Negative for bleeding problem.  Gastrointestinal: Negative for diarrhea, hematochezia, melena and vomiting.  All other systems reviewed and are negative.   PHYSICAL EXAM:   VS:  BP 96/60 mmHg  Pulse 147  Ht 6\' 2"  (1.88 m)  Wt 225 lb (102.059 kg)  BMI 28.88 kg/m2  SpO2 94%   GEN: Well nourished, well developed, in  no acute distress HEENT: normal Neck: no JVD, no masses Cardiac: Normal S1/S2, rapid irregular rhythm; no murmurs, no edema    Respiratory:  clear to auscultation bilaterally; no wheezing, rhonchi or rales GI: soft, nontender  MS: no deformity or atrophy Skin: warm and dry, no rash Neuro:    no focal deficits  Psych: Alert and oriented x 3, normal affect  Wt Readings from Last 3 Encounters:  08/16/15 223 lb 6.4 oz (101.334 kg)  08/16/15 225 lb (102.059 kg)  08/05/15 230 lb (104.327 kg)      Studies/Labs Reviewed:   EKG:  EKG is ordered today.  The ekg ordered today demonstrates AFib, HR 147, no ST changes  Recent Labs: 12/21/2014: TSH 1.500 12/25/2014: Magnesium 2.0 08/03/2015: ALT 27; BUN 8.9; Creatinine 0.9; HGB 10.3*; Platelets 92*; Potassium 3.7; Sodium 136  Labs (4/16 - from PCP) - TC 147, TG 244, HDL 5, LDL 93 Labs (6/16 - from PCP) - AST 149, ALT 51   Recent Lipid Panel    Component Value Date/Time   CHOL 156 11/04/2012 0807   TRIG 200.0* 11/04/2012 0807   HDL 54.60 11/04/2012 0807   CHOLHDL 3 11/04/2012 0807   VLDL 40.0 11/04/2012 0807   LDLCALC 61 11/04/2012 0807   LDLDIRECT 95.6 05/11/2010 1021    Additional studies/ records that were reviewed today include:   Echo 12/25/14 EF 55-60%, MAC, mild LAE, L pleural eff  LHC 8/11 LAD prox ulcerated plaque 50-60%, small mid D2 90% LCx normal RCA mid to dist 40-50% EF 60%  PCI 8/11 4 x 12 mm Vision BMS to prox LAD  ASSESSMENT:    1. PAF (paroxysmal atrial fibrillation) ([place])   2. Coronary artery disease involving native coronary artery of native heart without angina pectoris   3. Alcoholic cirrhosis of liver with ascites ([place])   4. Hyperlipidemia     PLAN:  In order of problems listed above:  1. Atrial Fibrillation with RVR - He was taken off of Sotalol in 5/16 during an admit for sepsis and low BP.  He now presents with recurrent AF with RVR.  He is asymptomatic.  He is not a candidate for  anticoagulation b/c of his hx of alcoholism.  BP is soft and limits rate controlling agents.  I reviewed his case with Dr. Casandra Schroeder (DOD) today.  His options are limited.  We are not able to cardiovert him as he is not on anticoagulation. We need to achieve rate control and it will likely be difficult as an outpatient.   Ideally, placing him on anticoagulation with Coumadin for 1-2 mos, proceeding with TEE-DCCV and resuming Sotalol would be the best case scenario.    -  [facility] to [facility]  -  Will try to rate control with Digoxin and Metoprolol Tartrate .  -  Digoxin 0.25 mg x 1 today and start Dig 0.125 mg QD in AM  -  Metoprolol Tartrate 12.5 mg TID  -  May need to get EP to see him to help determine if Sotalol can be resumed.  -  May need GI to see him as well to help with decision regarding anticoagulation.  -  Will start IV heparin.   2. CAD - s/p PCI to the LAD in 2011 with a BMS. No angina. He remains on Plavix.  He is not on statin Rx given hx of liver disease.   3. Alcoholism -  He notes that he occasionally drinks ETOH.  Will need to cover with CIWA protocol.    4. HL - No longer on statin Rx.   Signed, Richardson Dopp, PA-C  08/16/2015 5:03 PM    [facility] [place], [place], [place]  [address] Phone: [phone]; Fax: [phone]    Medication Adjustments/Labs and Tests Ordered: Current medicines are reviewed at length with the patient today.  Concerns regarding medicines are outlined above.  Medication changes, Labs and Tests ordered today are listed in the "Patient Instructions" from the AVS below.  Patient Instructions  YOU ARE BEING ADMITTED TO [place] TO UNIT 2 WEST

## 2015-08-16 ENCOUNTER — Inpatient Hospital Stay (HOSPITAL_COMMUNITY)
Admission: AD | Admit: 2015-08-16 | Discharge: 2015-08-21 | DRG: 309 | Disposition: A | Discharge status: Home / Self Care | Payer: Medicare Other | Source: Ambulatory Visit | Attending: Interventional Cardiology | Admitting: Interventional Cardiology

## 2015-08-16 ENCOUNTER — Ambulatory Visit (INDEPENDENT_AMBULATORY_CARE_PROVIDER_SITE_OTHER): Payer: Medicare Other | Admitting: Physician Assistant

## 2015-08-16 ENCOUNTER — Encounter (HOSPITAL_COMMUNITY): Payer: Self-pay

## 2015-08-16 ENCOUNTER — Encounter: Payer: Self-pay | Admitting: Physician Assistant

## 2015-08-16 VITALS — BP 96/60 | HR 147 | Ht 74.0 in | Wt 225.0 lb

## 2015-08-16 DIAGNOSIS — I4891 Unspecified atrial fibrillation: Secondary | ICD-10-CM | POA: Diagnosis present

## 2015-08-16 DIAGNOSIS — Z7902 Long term (current) use of antithrombotics/antiplatelets: Secondary | ICD-10-CM

## 2015-08-16 DIAGNOSIS — C61 Malignant neoplasm of prostate: Secondary | ICD-10-CM | POA: Diagnosis not present

## 2015-08-16 DIAGNOSIS — E785 Hyperlipidemia, unspecified: Secondary | ICD-10-CM | POA: Diagnosis present

## 2015-08-16 DIAGNOSIS — Z9049 Acquired absence of other specified parts of digestive tract: Secondary | ICD-10-CM

## 2015-08-16 DIAGNOSIS — D539 Nutritional anemia, unspecified: Secondary | ICD-10-CM | POA: Diagnosis present

## 2015-08-16 DIAGNOSIS — I2510 Atherosclerotic heart disease of native coronary artery without angina pectoris: Secondary | ICD-10-CM | POA: Diagnosis present

## 2015-08-16 DIAGNOSIS — D696 Thrombocytopenia, unspecified: Secondary | ICD-10-CM

## 2015-08-16 DIAGNOSIS — R251 Tremor, unspecified: Secondary | ICD-10-CM | POA: Diagnosis present

## 2015-08-16 DIAGNOSIS — F1020 Alcohol dependence, uncomplicated: Secondary | ICD-10-CM | POA: Diagnosis present

## 2015-08-16 DIAGNOSIS — K7031 Alcoholic cirrhosis of liver with ascites: Secondary | ICD-10-CM

## 2015-08-16 DIAGNOSIS — I480 Paroxysmal atrial fibrillation: Secondary | ICD-10-CM | POA: Diagnosis not present

## 2015-08-16 DIAGNOSIS — Z9852 Vasectomy status: Secondary | ICD-10-CM

## 2015-08-16 DIAGNOSIS — Z955 Presence of coronary angioplasty implant and graft: Secondary | ICD-10-CM

## 2015-08-16 DIAGNOSIS — Z87891 Personal history of nicotine dependence: Secondary | ICD-10-CM

## 2015-08-16 DIAGNOSIS — Z8546 Personal history of malignant neoplasm of prostate: Secondary | ICD-10-CM

## 2015-08-16 DIAGNOSIS — Z79899 Other long term (current) drug therapy: Secondary | ICD-10-CM

## 2015-08-16 DIAGNOSIS — I10 Essential (primary) hypertension: Secondary | ICD-10-CM | POA: Diagnosis present

## 2015-08-16 DIAGNOSIS — D6489 Other specified anemias: Secondary | ICD-10-CM | POA: Diagnosis present

## 2015-08-16 DIAGNOSIS — F1010 Alcohol abuse, uncomplicated: Secondary | ICD-10-CM | POA: Diagnosis not present

## 2015-08-16 DIAGNOSIS — C7951 Secondary malignant neoplasm of bone: Secondary | ICD-10-CM | POA: Diagnosis present

## 2015-08-16 HISTORY — DX: Major depressive disorder, single episode, unspecified: F32.9

## 2015-08-16 HISTORY — DX: Atherosclerotic heart disease of native coronary artery without angina pectoris: I25.10

## 2015-08-16 HISTORY — DX: Cardiac arrhythmia, unspecified: I49.9

## 2015-08-16 HISTORY — DX: Depression, unspecified: F32.A

## 2015-08-16 HISTORY — DX: Pneumonia, unspecified organism: J18.9

## 2015-08-16 LAB — CBC WITH DIFFERENTIAL/PLATELET
Basophils Absolute: 0.1 10*3/uL (ref 0.0–0.1)
Basophils Relative: 1 %
EOS ABS: 0.1 10*3/uL (ref 0.0–0.7)
EOS PCT: 2 %
HCT: 34.6 % — ABNORMAL LOW (ref 39.0–52.0)
Hemoglobin: 11.5 g/dL — ABNORMAL LOW (ref 13.0–17.0)
LYMPHS ABS: 0.9 10*3/uL (ref 0.7–4.0)
Lymphocytes Relative: 15 %
MCH: 35.5 pg — AB (ref 26.0–34.0)
MCHC: 33.2 g/dL (ref 30.0–36.0)
MCV: 106.8 fL — ABNORMAL HIGH (ref 78.0–100.0)
MONOS PCT: 8 %
Monocytes Absolute: 0.5 10*3/uL (ref 0.1–1.0)
Neutro Abs: 4.2 10*3/uL (ref 1.7–7.7)
Neutrophils Relative %: 73 %
PLATELETS: 94 10*3/uL — AB (ref 150–400)
RBC: 3.24 MIL/uL — ABNORMAL LOW (ref 4.22–5.81)
RDW: 16.2 % — ABNORMAL HIGH (ref 11.5–15.5)
WBC: 5.7 10*3/uL (ref 4.0–10.5)

## 2015-08-16 LAB — COMPREHENSIVE METABOLIC PANEL
ALBUMIN: 3.2 g/dL — AB (ref 3.5–5.0)
ALK PHOS: 162 U/L — AB (ref 38–126)
ALT: 28 U/L (ref 17–63)
AST: 98 U/L — AB (ref 15–41)
Anion gap: 13 (ref 5–15)
BILIRUBIN TOTAL: 1.5 mg/dL — AB (ref 0.3–1.2)
BUN: 9 mg/dL (ref 6–20)
CALCIUM: 8.8 mg/dL — AB (ref 8.9–10.3)
CO2: 22 mmol/L (ref 22–32)
Chloride: 99 mmol/L — ABNORMAL LOW (ref 101–111)
Creatinine, Ser: 1.07 mg/dL (ref 0.61–1.24)
GFR calc Af Amer: >60 mL/min (ref >60)
GFR calc non Af Amer: >60 mL/min (ref >60)
GLUCOSE: 110 mg/dL — AB (ref 65–99)
Potassium: 3.5 mmol/L (ref 3.5–5.1)
Sodium: 134 mmol/L — ABNORMAL LOW (ref 135–145)
TOTAL PROTEIN: 8.3 g/dL — AB (ref 6.5–8.1)

## 2015-08-16 LAB — PROTIME-INR
INR: 1.41 (ref 0.00–1.49)
PROTHROMBIN TIME: 17.4 s — AB (ref 11.6–15.2)

## 2015-08-16 LAB — APTT: aPTT: 38 seconds — ABNORMAL HIGH (ref 24–37)

## 2015-08-16 LAB — MAGNESIUM: Magnesium: 2.1 mg/dL (ref 1.7–2.4)

## 2015-08-16 LAB — TSH: TSH: 3.376 u[IU]/mL (ref 0.350–4.500)

## 2015-08-16 MED ORDER — SODIUM CHLORIDE 0.9 % IJ SOLN
3.0000 mL | Freq: Two times a day (BID) | INTRAMUSCULAR | Status: DC
  Administered 2015-08-16 – 2015-08-19 (×4): 3 mL via INTRAVENOUS

## 2015-08-16 MED ORDER — SPIRONOLACTONE 25 MG PO TABS
25.0000 mg | ORAL_TABLET | Freq: Every day | ORAL | Status: DC
  Administered 2015-08-17 – 2015-08-21 (×5): 25 mg via ORAL
  Filled 2015-08-16 (×5): qty 1

## 2015-08-16 MED ORDER — FLORA-Q PO CAPS
1.0000 | ORAL_CAPSULE | Freq: Every day | ORAL | Status: DC
Start: 2015-08-17 — End: 2015-08-21
  Administered 2015-08-17 – 2015-08-21 (×5): 1 via ORAL
  Filled 2015-08-16 (×5): qty 1

## 2015-08-16 MED ORDER — SODIUM CHLORIDE 0.9 % IV SOLN: 250.0000 mL | INTRAVENOUS | Status: DC | PRN

## 2015-08-16 MED ORDER — DILTIAZEM HCL 100 MG IV SOLR
10.0000 mg/h | INTRAVENOUS | Status: DC
  Administered 2015-08-16 (×2): 10 mg/h via INTRAVENOUS
  Filled 2015-08-16 (×2): qty 100

## 2015-08-16 MED ORDER — DILTIAZEM LOAD VIA INFUSION
10.0000 mg | Freq: Once | INTRAVENOUS | Status: AC
  Administered 2015-08-16: 10 mg via INTRAVENOUS
  Filled 2015-08-16: qty 10

## 2015-08-16 MED ORDER — ACETAMINOPHEN 325 MG PO TABS
650.0000 mg | ORAL_TABLET | ORAL | Status: DC | PRN
  Administered 2015-08-17: 650 mg via ORAL
  Filled 2015-08-16: qty 2

## 2015-08-16 MED ORDER — SULFAMETHOXAZOLE-TRIMETHOPRIM 800-160 MG PO TABS
1.0000 | ORAL_TABLET | Freq: Every day | ORAL | Status: DC
  Administered 2015-08-17 – 2015-08-21 (×5): 1 via ORAL
  Filled 2015-08-16 (×5): qty 1

## 2015-08-16 MED ORDER — ENZALUTAMIDE 40 MG PO CAPS: 160.0000 mg | ORAL_CAPSULE | Freq: Every day | ORAL | Status: DC

## 2015-08-16 MED ORDER — FUROSEMIDE 20 MG PO TABS
20.0000 mg | ORAL_TABLET | Freq: Every day | ORAL | Status: DC
  Administered 2015-08-17 – 2015-08-21 (×5): 20 mg via ORAL
  Filled 2015-08-16 (×5): qty 1

## 2015-08-16 MED ORDER — ENZALUTAMIDE 40 MG PO CAPS
120.0000 mg | ORAL_CAPSULE | Freq: Every day | ORAL | Status: DC
  Administered 2015-08-16 – 2015-08-20 (×5): 120 mg via ORAL
  Filled 2015-08-16 (×2): qty 3

## 2015-08-16 MED ORDER — LACTULOSE 10 GM/15ML PO SOLN: 10.0000 g | Freq: Two times a day (BID) | ORAL | Status: DC | PRN

## 2015-08-16 MED ORDER — CLOPIDOGREL BISULFATE 75 MG PO TABS
75.0000 mg | ORAL_TABLET | Freq: Every day | ORAL | Status: DC
  Administered 2015-08-17 – 2015-08-18 (×2): 75 mg via ORAL
  Filled 2015-08-16 (×2): qty 1

## 2015-08-16 MED ORDER — HEPARIN (PORCINE) IN NACL 100-0.45 UNIT/ML-% IJ SOLN
2100.0000 [IU]/h | INTRAMUSCULAR | Status: DC
  Administered 2015-08-16: 1400 [IU]/h via INTRAVENOUS
  Administered 2015-08-18: 2100 [IU]/h via INTRAVENOUS
  Filled 2015-08-16 (×4): qty 250

## 2015-08-16 MED ORDER — FOLIC ACID 1 MG PO TABS
1.0000 mg | ORAL_TABLET | Freq: Every day | ORAL | Status: DC
  Administered 2015-08-17 – 2015-08-21 (×5): 1 mg via ORAL
  Filled 2015-08-16 (×5): qty 1

## 2015-08-16 MED ORDER — RIFAXIMIN 550 MG PO TABS
550.0000 mg | ORAL_TABLET | Freq: Two times a day (BID) | ORAL | Status: DC
  Administered 2015-08-16 – 2015-08-21 (×10): 550 mg via ORAL
  Filled 2015-08-16 (×10): qty 1

## 2015-08-16 MED ORDER — ONDANSETRON HCL 4 MG/2ML IJ SOLN
4.0000 mg | Freq: Four times a day (QID) | INTRAMUSCULAR | Status: DC | PRN
  Administered 2015-08-17: 4 mg via INTRAVENOUS
  Filled 2015-08-16: qty 2

## 2015-08-16 MED ORDER — SODIUM CHLORIDE 0.9 % IJ SOLN: 3.0000 mL | INTRAMUSCULAR | Status: DC | PRN

## 2015-08-16 MED ORDER — ADULT MULTIVITAMIN W/MINERALS CH
1.0000 | ORAL_TABLET | Freq: Every day | ORAL | Status: DC
  Administered 2015-08-17 – 2015-08-21 (×5): 1 via ORAL
  Filled 2015-08-16 (×6): qty 1

## 2015-08-16 MED ORDER — VITAMIN B-1 100 MG PO TABS
100.0000 mg | ORAL_TABLET | Freq: Every day | ORAL | Status: DC
  Administered 2015-08-17 – 2015-08-21 (×5): 100 mg via ORAL
  Filled 2015-08-16 (×5): qty 1

## 2015-08-16 MED ORDER — FUROSEMIDE 20 MG PO TABS
10.0000 mg | ORAL_TABLET | Freq: Every day | ORAL | Status: DC
  Administered 2015-08-16: 10 mg via ORAL
  Filled 2015-08-16: qty 1

## 2015-08-16 MED ORDER — OXYCODONE HCL 5 MG PO TABS
2.5000 mg | ORAL_TABLET | Freq: Every evening | ORAL | Status: DC | PRN
  Administered 2015-08-16 – 2015-08-20 (×5): 5 mg via ORAL
  Filled 2015-08-16 (×5): qty 1

## 2015-08-16 MED ORDER — DIGOXIN 125 MCG PO TABS
0.1250 mg | ORAL_TABLET | Freq: Every day | ORAL | Status: DC
  Administered 2015-08-17 – 2015-08-21 (×5): 0.125 mg via ORAL
  Filled 2015-08-16 (×5): qty 1

## 2015-08-16 MED ORDER — ENZALUTAMIDE 40 MG PO CAPS: 120.0000 mg | ORAL_CAPSULE | Freq: Every day | ORAL | Status: DC

## 2015-08-16 MED ORDER — METOPROLOL TARTRATE 12.5 MG HALF TABLET
12.5000 mg | ORAL_TABLET | Freq: Three times a day (TID) | ORAL | Status: DC
  Administered 2015-08-16 – 2015-08-18 (×4): 12.5 mg via ORAL
  Filled 2015-08-16 (×5): qty 1

## 2015-08-16 MED ORDER — HEPARIN BOLUS VIA INFUSION
4000.0000 [IU] | Freq: Once | INTRAVENOUS | Status: AC
  Administered 2015-08-16: 4000 [IU] via INTRAVENOUS
  Filled 2015-08-16: qty 4000

## 2015-08-16 MED ORDER — DIGOXIN 125 MCG PO TABS
0.2500 mg | ORAL_TABLET | Freq: Once | ORAL | Status: AC
  Administered 2015-08-16: 0.25 mg via ORAL
  Filled 2015-08-16: qty 2

## 2015-08-16 NOTE — Progress Notes (Signed)
ANTICOAGULATION CONSULT NOTE - Initial Consult  Pharmacy Consult for Heparin  Indication: atrial fibrillation  No Known Allergies  Patient Measurements: Height: 6\' 2"  (188 cm) Weight: 223 lb 6.4 oz (101.334 kg) IBW/kg (Calculated) : 82.2 Heparin Dosing Weight: 101 kg =TBW  Vital Signs: Temp: 98.5 F (36.9 C) (01/09 1658) Temp Source: Oral (01/09 1658) BP: 126/65 mmHg (01/09 1658) Pulse Rate: 167 (01/09 1711)  Labs: No results for input(s): HGB, HCT, PLT, APTT, LABPROT, INR, HEPARINUNFRC, CREATININE, CKTOTAL, CKMB, TROPONINI in the last 72 hours.  Estimated Creatinine Clearance: 102.5 mL/min (by C-G formula based on Cr of 0.9).   Medical History: Past Medical History  Diagnosis Date  . Atrial fibrillation ([place])   . Other and unspecified hyperlipidemia   . Unspecified essential hypertension   . Malignant neoplasm of prostate ([place]) 11/2007    Mets to Bone  . Elevated prostate specific antigen (PSA)   . Personal history of colonic polyps   . Hypocalcemia   . Acute alcoholic hepatitis   . Essential and other specified forms of tremor   . Anxiety state, unspecified   . Family history of malignant neoplasm of gastrointestinal tract   . Alcohol abuse with physiological dependence ([place]) 02/13/2012  . 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 Alcoholic hepatitis with ascites 11/13/2014  . Bacteremia, escherichia coli     Medications:  Scheduled:  . [START ON 08/17/2015] clopidogrel  75 mg Oral Daily  . [START ON 08/17/2015] digoxin  0.125 mg Oral Daily  . digoxin  0.25 mg Oral Once  . enzalutamide  160 mg Oral Daily  . [START ON 08/17/2015] FLORA-Q  1 capsule Oral Daily  . [START ON [id number] folic acid  1 mg Oral Daily  . furosemide  10 mg Oral Daily  . metoprolol tartrate  12.5 mg Oral TID  . [START ON 08/17/2015] multivitamin with minerals  1 tablet Oral Daily  . rifaximin  550 mg  Oral BID  . sodium chloride  3 mL Intravenous Q12H  . [START ON 08/17/2015] spironolactone  25 mg Oral Daily  . [START ON 08/17/2015] sulfamethoxazole-trimethoprim  1 tablet Oral Daily  . [START ON 08/17/2015] thiamine  100 mg Oral Daily    Assessment: 65 y.o male with a hx of CAD status post prior PCI of the LAD in 2011, PAF, metastatic prostate cancer, alcoholism with associated alcohol-related chronic liver disease. Atrial Fibrillation with RVR - He was taken off of Sotalol in 5/16 during an admit for sepsis and low BP. He now presents with recurrent AF with RVR. He is asymptomatic. He is not a candidate for anticoagulation b/c of his hx of alcoholism. However, cardiologist notes need to achieve rate control which will be difficult as outpatient, thus decided to place him on anticoagulation with coumadin for 1-2 months, then TEE-DCCV and resuming Sotalol would be the best case scenario. Pharmacy consulted to start IV heparin infusion.  Labs on 08/03/15:  AST 86/ALT 27,Hgb 10.3, pltc 92K. Admit labs are pending.  Goal of Therapy:  Heparin level 0.3-0.7 units/ml Monitor platelets by anticoagulation protocol: Yes   Plan:  Heparin bolus 4000 units IV x1 Heparin drip at 1400 units/hr Heparin level in 6 hours. Daily heparin level and CBC.  Nicole Cella, RPh Clinical Pharmacist Pager: [phone] 08/16/2015,5:19 PM

## 2015-08-16 NOTE — Patient Instructions (Signed)
YOU ARE BEING ADMITTED TO [place] TO UNIT 2 WEST

## 2015-08-16 NOTE — H&P (Addendum)
Admission History and Physical   Date:  08/15/2015   ID:  Glynda Jaeger, DOB 08-13-1950, MRN [id number]  PCP:  [name], MD  Cardiologist:  Dr. Sherren Mocha   Electrophysiologist:  n/a  Chief Complaint  Patient presents with  . Follow-up  . Coronary Artery Disease  . Atrial Fibrillation    History of Present Illness:  DEANDREW SEANEY is a 65 y.o. male with a hx of CAD status post prior PCI of the LAD in 2011, PAF, metastatic prostate cancer, alcoholism with associated alcohol-related chronic liver disease. He is not a candidate for anticoagulation. He has maintained sinus rhythm on sotalol. Last seen by Dr. Burt Knack 11/15.  Admitted 5/16 with Escherichia coli sepsis. Of note, sotalol was stopped at that time secondary to hypotension. We have not seen him since that time and he has remained off of sotalol.  He comes in today for routine FU and is in recurrent atrial fibrillation with an HR 147.  He is asymptomatic.  He denies dyspnea, dizziness, fatigue.  He denies chest pain.  He denies syncope.  He denies orthopnea, PND, edema.      Past Medical History  Diagnosis Date  . Atrial fibrillation   . Other and unspecified hyperlipidemia   . Unspecified essential hypertension   . Malignant neoplasm of prostate 11/2007    Mets to Bone  . Elevated prostate specific antigen (PSA)   . Personal history of colonic polyps   . Hypocalcemia   . Acute alcoholic hepatitis   . Essential and other specified forms of tremor   . Anxiety state, unspecified   . Family history of malignant neoplasm of gastrointestinal tract   . Alcohol abuse with physiological dependence 02/13/2012  . 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 Alcoholic hepatitis with ascites 11/13/2014  . Bacteremia, escherichia coli     Past Surgical History  Procedure Laterality Date  . Vasectomy    . Cholecystectomy    . Coronary stent  placement  2011  . Colonoscopy w/ biopsies      Current Outpatient Prescriptions  Medication Sig Dispense Refill  . CALCIUM CARB-CHOLECALCIFEROL PO Take 1 capsule by mouth 2 (two) times daily.     . clopidogrel (PLAVIX) 75 MG tablet TAKE ONE TABLET BY MOUTH ONCE DAILY (Patient taking differently: Take 75 mg by mouth daily. ) 30 tablet 11  . enzalutamide (XTANDI) 40 MG capsule Take 4 capsules (160 mg total) by mouth daily. [id number] capsule 0  . folic acid (FOLVITE) 1 MG tablet Take one tablet by mouth once a day.... (Patient taking differently: Take 1 mg by mouth daily. ) 30 tablet 6  . furosemide (LASIX) 20 MG tablet Take 0.5 tablets (10 mg total) by mouth daily. 30 tablet 1  . lactulose (CHRONULAC) 10 GM/15ML solution Take 10 g by mouth 2 (two) times daily as needed for mild constipation.    . Multiple Vitamin (MULTIVITAMIN) tablet Take 1 tablet by mouth daily.      [name] oxyCODONE (OXY IR/ROXICODONE) 5 MG immediate release tablet Take 0.5-1 tablets (2.5-5 mg total) by mouth every 6 (six) hours as needed for moderate pain or severe pain. (Patient taking differently: Take 2.5-5 mg by mouth at bedtime as needed for moderate pain or severe pain. )    . Probiotic Product (ALIGN) 4 MG CAPS Take 1 capsule by mouth daily.    . rifaximin (XIFAXAN) 550  MG TABS tablet Take 1 tablet (550 mg total) by mouth 2 (two) times daily. 60 tablet 0  . spironolactone (ALDACTONE) 25 MG tablet Take 1 tablet (25 mg total) by mouth daily. 30 tablet 1  . sulfamethoxazole-trimethoprim (BACTRIM DS,SEPTRA DS) 800-160 MG per tablet Take 1 tablet by mouth daily.    [name] thiamine 100 MG tablet Take 1 tablet (100 mg total) by mouth daily. 30 tablet 0   No current facility-administered medications for this visit.    Allergies:   Review of patient's allergies indicates no known allergies.   Social History   Social History  . Marital Status: Married    Spouse Name: N/A  . Number of Children: 3  . Years of Education: N/A    Occupational History  . lawyer     [employer] of [employer] History Main Topics  . Smoking status: Former Smoker    Types: Cigarettes  . Smokeless tobacco: Never Used     Comment: stopped in college  . Alcohol Use: Yes     Comment: has consumed as much as a 1/2 gallon of vodka in a day, now drinks at least 3-5 drinks per day.   . Drug Use: No  . Sexual Activity: No   Other Topics Concern  . Not on file   Social History Narrative   Patient is married. 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 He has two sons, one daughter and 5 grandchildren. Both sons work in the business. Daughter lives in [place]. Close knit family.    11/18/2014        Family History:  The patient's family history includes Aneurysm in his mother; Colon cancer in his father; Coronary artery disease in his father; Prostate cancer in his father.   ROS:   Please see the history of present illness.    Review of Systems  Constitution: Negative for chills and fever.  Respiratory: Negative for cough.   Hematologic/Lymphatic: Negative for bleeding problem.  Gastrointestinal: Negative for diarrhea, hematochezia, melena and vomiting.  All other systems reviewed and are negative.   PHYSICAL EXAM:   VS:  BP 96/60 mmHg  Pulse 147  Ht 6\' 2"  (1.88 m)  Wt 225 lb (102.059 kg)  BMI 28.88 kg/m2  SpO2 94%   GEN: Well nourished, well developed, in no acute distress HEENT: normal Neck: no JVD, no masses Cardiac: Normal S1/S2, rapid irregular rhythm; no murmurs, no edema    Respiratory:  clear to auscultation bilaterally; no wheezing, rhonchi or rales GI: soft, nontender  MS: no deformity or atrophy Skin: warm and dry, no rash Neuro:    no focal deficits  Psych: Alert and oriented x 3, normal affect  Wt Readings from Last 3 Encounters:  08/05/15  230 lb (104.327 kg)  06/25/15 223 lb 12.8 oz (101.515 kg)  04/29/15 212 lb (96.163 kg)      Studies/Labs Reviewed:   EKG:  EKG is ordered today.  The ekg ordered today demonstrates AFib, HR 147, no ST changes  Recent Labs: 12/21/2014: TSH 1.500 12/25/2014: Magnesium 2.0 08/03/2015: ALT 27; BUN 8.9; Creatinine 0.9; HGB 10.3*; Platelets 92*; Potassium 3.7; Sodium 136  Labs (4/16 - from PCP) - TC 147, TG 244, HDL 5, LDL 93 Labs (6/16 - from PCP) - AST 149, ALT 51   Recent Lipid  Panel    Component Value Date/Time   CHOL 156 11/04/2012 0807   TRIG 200.0* 11/04/2012 0807   HDL 54.60 11/04/2012 0807   CHOLHDL 3 11/04/2012 0807   VLDL 40.0 11/04/2012 0807   LDLCALC 61 11/04/2012 0807   LDLDIRECT 95.6 05/11/2010 1021    Additional studies/ records that were reviewed today include:   Echo 12/25/14 EF 55-60%, MAC, mild LAE, L pleural eff  LHC 8/11 LAD prox ulcerated plaque 50-60%, small mid D2 90% LCx normal RCA mid to dist 40-50% EF 60%  PCI 8/11 4 x 12 mm Vision BMS to prox LAD  ASSESSMENT:    1. PAF (paroxysmal atrial fibrillation) ([place])   2. Coronary artery disease involving native coronary artery of native heart without angina pectoris   3. Alcoholic cirrhosis of liver with ascites ([place])   4. Hyperlipidemia     PLAN:  In order of problems listed above:  1. Atrial Fibrillation with RVR - He was taken off of Sotalol in 5/16 during an admit for sepsis and low BP.  He now presents with recurrent AF with RVR.  He is asymptomatic.  He is not a candidate for anticoagulation b/c of his hx of alcoholism.  BP is soft and limits rate controlling agents.  I reviewed his case with Dr. Casandra Doffing (DOD) today.  His options are limited.  We are not able to cardiovert him as he is not on anticoagulation. We need to achieve rate control and it will likely be difficult as an outpatient.   Ideally, placing him on anticoagulation with Coumadin for 1-2 mos, proceeding with TEE-DCCV and  resuming Sotalol would be the best case scenario.  Addendum 08/17/2015 9:12 AM - CHADS2-VASc=3 (age, vascular disease, hx of HTN).   -  Admit to [facility]  -  Will try to rate control with Digoxin and Metoprolol Tartrate .  -  Digoxin 0.25 mg x 1 today and start Dig 0.125 mg QD in AM  -  Metoprolol Tartrate 12.5 mg TID  -  May need to get EP to see him to help determine if Sotalol can be resumed.  -  May need GI to see him as well to help with decision regarding anticoagulation.  -  Will start IV heparin.   2. CAD - s/p PCI to the LAD in 2011 with a BMS. No angina. He remains on Plavix.  He is not on statin Rx given hx of liver disease.   3. Alcoholism -  He notes that he occasionally drinks ETOH.  Will need to cover with CIWA protocol.    4. HL - No longer on statin Rx.   Signed, Richardson Dopp, PA-C  08/15/2015 9:32 PM    [facility] [address], [place], [place]  [address] Phone: [phone]; Fax: [phone]   I have examined the patient and reviewed assessment and plan and discussed with patient.  Agree with above as stated.  Recurrent atrial fibrillation in this patient who had been maintained in sinus rhythm. He had been on sotalol in the past. At the time of his admission for sepsis, his sotalol was not part of his discharge medication regimen. He is difficult to rate control due to his low blood pressure. He is not an ideal candidate for long-term anticoagulation due to his liver disease. His sotalol could be initiated again, but he would have to be hospitalized.  Plan: 1) start IV heparin with a goal of TEE cardioversion  to restore sinus rhythm. This would be followed by Coumadin or NOAC for a month. If no LAA thrombus, Sotalol could be reinitiated while he is in the hospital and hopefully this would help maintain sinus rhythm and prevent tachycardia.  Digoxin for now for rate control that will not lower his blood pressure.    VARANASI,JAYADEEP S.

## 2015-08-17 ENCOUNTER — Telehealth: Payer: Self-pay | Admitting: Physician Assistant

## 2015-08-17 ENCOUNTER — Encounter (HOSPITAL_COMMUNITY): Payer: Self-pay | Admitting: Student

## 2015-08-17 DIAGNOSIS — I4891 Unspecified atrial fibrillation: Secondary | ICD-10-CM | POA: Diagnosis not present

## 2015-08-17 LAB — CBC
HEMATOCRIT: 34.6 % — AB (ref 39.0–52.0)
HEMOGLOBIN: 11.7 g/dL — AB (ref 13.0–17.0)
MCH: 36.1 pg — AB (ref 26.0–34.0)
MCHC: 33.8 g/dL (ref 30.0–36.0)
MCV: 106.8 fL — AB (ref 78.0–100.0)
Platelets: 92 10*3/uL — ABNORMAL LOW (ref 150–400)
RBC: 3.24 MIL/uL — AB (ref 4.22–5.81)
RDW: 16.1 % — ABNORMAL HIGH (ref 11.5–15.5)
WBC: 5.4 10*3/uL (ref 4.0–10.5)

## 2015-08-17 LAB — HEPARIN LEVEL (UNFRACTIONATED)
Heparin Unfractionated: 0.15 IU/mL — ABNORMAL LOW (ref 0.30–0.70)
Heparin Unfractionated: 0.23 IU/mL — ABNORMAL LOW (ref 0.30–0.70)

## 2015-08-17 MED ORDER — PNEUMOCOCCAL VAC POLYVALENT 25 MCG/0.5ML IJ INJ: 0.5000 mL | INJECTION | INTRAMUSCULAR | Status: DC

## 2015-08-17 MED ORDER — LORAZEPAM 1 MG PO TABS
0.0000 mg | ORAL_TABLET | Freq: Two times a day (BID) | ORAL | Status: AC
  Administered 2015-08-19 – 2015-08-20 (×2): 1 mg via ORAL
  Filled 2015-08-17 (×2): qty 1

## 2015-08-17 MED ORDER — SALINE SPRAY 0.65 % NA SOLN
1.0000 | NASAL | Status: DC | PRN
  Administered 2015-08-17: 1 via NASAL
  Filled 2015-08-17: qty 44

## 2015-08-17 MED ORDER — SODIUM CHLORIDE 0.9 % IJ SOLN: 3.0000 mL | INTRAMUSCULAR | Status: DC | PRN

## 2015-08-17 MED ORDER — SODIUM CHLORIDE 0.9 % IJ SOLN
3.0000 mL | Freq: Two times a day (BID) | INTRAMUSCULAR | Status: DC
  Administered 2015-08-17 – 2015-08-20 (×3): 3 mL via INTRAVENOUS

## 2015-08-17 MED ORDER — POTASSIUM CHLORIDE CRYS ER 20 MEQ PO TBCR
40.0000 meq | EXTENDED_RELEASE_TABLET | Freq: Once | ORAL | Status: AC
  Administered 2015-08-17: 40 meq via ORAL
  Filled 2015-08-17: qty 2

## 2015-08-17 MED ORDER — HEPARIN BOLUS VIA INFUSION
1000.0000 [IU] | Freq: Once | INTRAVENOUS | Status: AC
  Administered 2015-08-17: 1000 [IU] via INTRAVENOUS
  Filled 2015-08-17: qty 1000

## 2015-08-17 MED ORDER — DILTIAZEM HCL 60 MG PO TABS
60.0000 mg | ORAL_TABLET | Freq: Four times a day (QID) | ORAL | Status: DC
  Administered 2015-08-17 – 2015-08-18 (×4): 60 mg via ORAL
  Filled 2015-08-17 (×4): qty 1

## 2015-08-17 MED ORDER — HEPARIN BOLUS VIA INFUSION
2000.0000 [IU] | Freq: Once | INTRAVENOUS | Status: AC
Start: 2015-08-17 — End: 2015-08-17
  Administered 2015-08-17: 2000 [IU] via INTRAVENOUS
  Filled 2015-08-17: qty 2000

## 2015-08-17 MED ORDER — SODIUM CHLORIDE 0.9 % IV SOLN: 250.0000 mL | INTRAVENOUS | Status: DC

## 2015-08-17 MED ORDER — LORAZEPAM 2 MG/ML IJ SOLN: 1.0000 mg | Freq: Four times a day (QID) | INTRAMUSCULAR | Status: AC | PRN

## 2015-08-17 MED ORDER — LORAZEPAM 1 MG PO TABS
0.0000 mg | ORAL_TABLET | Freq: Four times a day (QID) | ORAL | Status: AC
  Administered 2015-08-17: 1 mg via ORAL
  Administered 2015-08-17: 2 mg via ORAL
  Administered 2015-08-17 – 2015-08-18 (×2): 1 mg via ORAL
  Administered 2015-08-18: 2 mg via ORAL
  Administered 2015-08-18 – 2015-08-19 (×2): 1 mg via ORAL
  Filled 2015-08-17 (×3): qty 1
  Filled 2015-08-17 (×2): qty 2
  Filled 2015-08-17: qty 4
  Filled 2015-08-17: qty 1

## 2015-08-17 MED ORDER — LORAZEPAM 1 MG PO TABS
1.0000 mg | ORAL_TABLET | Freq: Four times a day (QID) | ORAL | Status: AC | PRN
  Administered 2015-08-18 – 2015-08-20 (×3): 1 mg via ORAL
  Filled 2015-08-17 (×3): qty 1

## 2015-08-17 NOTE — Telephone Encounter (Signed)
Looks like it may be Dr. Darlin Coco  I will text him to let him know that want to talk with him. Richardson Dopp, PA-C   08/17/2015 10:26 AM

## 2015-08-17 NOTE — Telephone Encounter (Signed)
New message      Pt was admitted to hosp yesterday by Nicki Reaper.  Wife has questions.  Please call

## 2015-08-17 NOTE — Telephone Encounter (Signed)
Thank you Scott! 

## 2015-08-17 NOTE — Progress Notes (Signed)
ANTICOAGULATION CONSULT NOTE - Follow up [place] for Heparin  Indication: atrial fibrillation  No Known Allergies  Patient Measurements: Height: 6\' 2"  (188 cm) Weight: 217 lb (98.431 kg) IBW/kg (Calculated) : 82.2 Heparin Dosing Weight: 98.4 kg =TBW  Vital Signs: Temp: 97.4 F (36.3 C) (01/10 1236) Temp Source: Oral (01/10 1236) BP: 112/64 mmHg (01/10 1236) Pulse Rate: 95 (01/10 1236)  Labs:  Recent Labs  08/16/15 1804 08/17/15 0830 08/17/15 1611  HGB 11.5* 11.7*  --   HCT 34.6* 34.6*  --   PLT 94* 92*  --   APTT 38*  --   --   LABPROT 17.4*  --   --   INR 1.41  --   --   HEPARINUNFRC  --  0.15* 0.23*  CREATININE 1.07  --   --     Estimated Creatinine Clearance: 79 mL/min (by C-G formula based on Cr of 1.07).   Medical History: Past Medical History  Diagnosis Date  . Atrial fibrillation ([place])   . Other and unspecified hyperlipidemia   . Unspecified essential hypertension   . Elevated prostate specific antigen (PSA)   . Personal history of colonic polyps   . Hypocalcemia   . Acute alcoholic hepatitis   . Essential and other specified forms of tremor   . Anxiety state, unspecified   . Family history of malignant neoplasm of gastrointestinal tract   . Alcohol abuse with physiological dependence ([place]) 02/13/2012  . Bisphosphonate-associated osteonecrosis of the jaw ([place]) 04/28/2013    Localized area base of tooth #18 noted 8/14 by oral surgeon  . Alcoholic hepatitis with ascites 11/13/2014  . Bacteremia, escherichia coli   . Coronary artery disease     s/p PCI to the LAD in 2011 with a BMS  . Dysrhythmia   . Pneumonia     childhood  . Depression   . Malignant neoplasm of prostate ([place]) 11/2007    Mets to Bone    Medications:  Scheduled:  . clopidogrel  75 mg Oral Daily  . digoxin  0.125 mg Oral Daily  . diltiazem  60 mg Oral 4 times per day  . enzalutamide  120 mg Oral Daily  . FLORA-Q  1 capsule Oral Daily  . folic acid  1 mg Oral  Daily  . furosemide  20 mg Oral Daily  . LORazepam  0-4 mg Oral Q6H   Followed by  . [START ON 08/19/2015] LORazepam  0-4 mg Oral Q12H  . metoprolol tartrate  12.5 mg Oral TID  . multivitamin with minerals  1 tablet Oral Daily  . [START ON 08/18/2015] pneumococcal 23 valent vaccine  0.5 mL Intramuscular Tomorrow-1000  . rifaximin  550 mg Oral BID  . sodium chloride  3 mL Intravenous Q12H  . sodium chloride  3 mL Intravenous Q12H  . spironolactone  25 mg Oral Daily  . sulfamethoxazole-trimethoprim  1 tablet Oral Daily  . thiamine  100 mg Oral Daily    65 y.o male with a hx of CAD status post prior PCI of the LAD in 2011, PAF, metastatic prostate cancer, alcoholism with associated alcohol-related chronic liver disease. Atrial Fibrillation with RVR - He was taken off of Sotalol in 5/16 during an admit for sepsis and low BP. He now presents with recurrent AF with RVR. He is asymptomatic. He is not a candidate for anticoagulation b/c of his hx of alcoholism. However, cardiologist notes need to achieve rate control which will be difficult as outpatient, thus  decided to place him on anticoagulation with coumadin for 1-2 months, then TEE-DCCV and resuming Sotalol would be the best case scenario.  Currently on IV heparin at 1700 units/hr. F/u HL is sub-therapeutic at 0.23. RN reports no s/s of bleeding    Goal of Therapy:  Heparin level 0.3-0.7 units/ml Monitor platelets by anticoagulation protocol: Yes   Plan:  Heparin bolus 1000 units IV x1 Increase Heparin drip to 1850 units/hr Heparin level in 6 hours. @ 1630 Daily heparin level and CBC.  Albertina Parr, PharmD., BCPS Clinical Pharmacist Pager [phone]

## 2015-08-17 NOTE — Progress Notes (Signed)
Patient Name: Allen Schroeder Date of Encounter: 08/17/2015  Principal Problem:   Atrial fibrillation with RVR ([place]) Active Problems:   CAD, NATIVE VESSEL   Prostate cancer, primary, with metastasis from prostate to other site [facility])   Primary Cardiologist: Dr. Burt Knack Patient Profile: 65 y.o. male w/ PMH of CAD (s/p PCI-LAD in 2011), PAF (not on anticoagulation secondary to alcoholism), prostate cancer metastatic to the bone, and alcoholism with associated alcohol-related chronic liver disease admitted from the office on 08/16/2015 for atrial fibrillation w/ RVR.  SUBJECTIVE: Denies any chest pain, palpitations, or shortness of breath. Reports having mild nausea this AM.   OBJECTIVE Filed Vitals:   08/16/15 1658 08/16/15 1711 08/16/15 2017 08/17/15 0446  BP: 126/65  91/58 103/69  Pulse: 85 167 101 86  Temp: 98.5 F (36.9 C)  98.8 F (37.1 C) 98.4 F (36.9 C)  TempSrc: Oral  Oral Oral  Resp: 18  18 18   Height: 6\' 2"  (1.88 m)     Weight: 223 lb 6.4 oz (101.334 kg)   217 lb (98.431 kg)  SpO2: 96%  97% 97%    Intake/Output Summary (Last 24 hours) at 08/17/15 0906 Last data filed at 08/17/15 [id number]  Gross per 24 hour  Intake      0 ml  Output    300 ml  Net   -300 ml   Filed Weights   08/16/15 1658 08/17/15 0446  Weight: 223 lb 6.4 oz (101.334 kg) 217 lb (98.431 kg)    PHYSICAL EXAM General: Well developed, well nourished, male in no acute distress. Head: Normocephalic, atraumatic.  Neck: Supple without bruits, JVD not elevated. Lungs:  Resp regular and unlabored, CTA without wheezing or rales. Heart: Irregularly irregular, S1, S2, no S3, S4, or murmur; no rub. Abdomen: Soft, non-tender, non-distended with normoactive bowel sounds. No hepatomegaly. No rebound/guarding. No obvious abdominal masses. Extremities: No clubbing, cyanosis, or edema. Distal pedal pulses are 2+ bilaterally. Neuro: Alert and oriented X 3. Moves all extremities spontaneously. Mild tremor. Psych:  Normal affect.   LABS: CBC:  Recent Labs  08/16/15 1804 08/17/15 0830  WBC 5.7 5.4  NEUTROABS 4.2  --   HGB 11.5* 11.7*  HCT 34.6* 34.6*  MCV 106.8* 106.8*  PLT 94* 92*   INR:  Recent Labs  08/16/15 1804  INR [id number]   Basic Metabolic Panel:  Recent Labs  08/16/15 1804  NA 134*  K 3.5  CL 99*  CO2 22  GLUCOSE 110*  BUN 9  CREATININE 1.07  CALCIUM 8.8*  MG 2.1   Liver Function Tests:  Recent Labs  08/16/15 1804  AST 98*  ALT 28  ALKPHOS 162*  BILITOT 1.5*  PROT 8.3*  ALBUMIN 3.2*   BNP: No results found for: BNP PRO B NATRIURETIC PEPTIDE (BNP)  Date/Time Value Ref Range Status  01/11/2009 02:15 PM 218.0* 0.0 - 100.0 pg/mL Final   Thyroid Function Tests:  Recent Labs  08/16/15 1804  TSH 3.376    TELE: Atrial fibrillation, rates mostly in 80's - 90's. Peaking in 140's.       ECG: 08/16/2015: Atrial fibrillation w/ RVR, rate in 140's.  ECHO: 12/25/2014 Study Conclusions - Left ventricle: The cavity size was normal. Wall thickness was normal. Systolic function was normal. The estimated ejection fraction was in the range of 55% to 60%. - Mitral valve: Calcified annulus. Mildly thickened leaflets . - Left atrium: The atrium was mildly dilated. - Atrial septum: No defect or patent foramen  ovale was identified. - Pericardium, extracardiac: There was a left pleural effusion.   Current Medications:  . clopidogrel  75 mg Oral Daily  . digoxin  0.125 mg Oral Daily  . diltiazem  60 mg Oral 4 times per day  . enzalutamide  120 mg Oral Daily  . FLORA-Q  1 capsule Oral Daily  . folic acid  1 mg Oral Daily  . furosemide  20 mg Oral Daily  . LORazepam  0-4 mg Oral Q6H   Followed by  . [START ON 08/19/2015] LORazepam  0-4 mg Oral Q12H  . metoprolol tartrate  12.5 mg Oral TID  . multivitamin with minerals  1 tablet Oral Daily  . [START ON 08/18/2015] pneumococcal 23 valent vaccine  0.5 mL Intramuscular Tomorrow-1000  . rifaximin  550 mg Oral BID    . sodium chloride  3 mL Intravenous Q12H  . spironolactone  25 mg Oral Daily  . sulfamethoxazole-trimethoprim  1 tablet Oral Daily  . thiamine  100 mg Oral Daily   . heparin 1,400 Units/hr (08/16/15 2307)    ASSESSMENT AND PLAN:  1. Atrial Fibrillation with RVR  - History of atrial fibrillation 6+ years ago. Underwent successful TEE/DCCV then. Taken off Sotalol in 5/16 during an admit for sepsis and low BP. Found to be in recurrent AF with RVR on 08/16/2015 during his routine office visit.  - BP is soft and limits rate controlling agents.Started on Dig 0.125 mg daily and Metoprolol 12.5mg  TID. - Cardizem drip at 10mg /hr was also started. SBP has been in the 90's - 120's since admission. Will change IV Cardizem to short-acting Cardizem 60mg  QID with plans to switch to Long-acting Cardizem on 08/18/2015. - This patients CHA2DS2-VASc Score and unadjusted Ischemic Stroke Rate (% per year) is equal to 2.2 % stroke rate/year from a score of 2 (Vascular, Age). Not deemed a candidate for anticoagulation in the past due to alcoholism. May need GI to see him as well to help with decision regarding anticoagulation. Currently on Heparin. - initial plan was to do TEE/DCCV today, but Endoscopy is full. Potential TEE/DCCV tomorrow vs. Rate control with PO medications and starting anticoagulation for 30-days and brining back for DCCV. The patient seems to prefer having a TEE/DCCV this admission since he was in NSR for over 6 years with his prior one.  2. CAD - s/p PCI to the LAD in 2011 with a BMS.  - denies any recent anginal symptoms. - He remains on Plavix.He is not on statin Rx given hx of liver disease.   3. Alcoholism  - has mild tremor. - on CIWA protocol while admitted.   4. HLD  - No longer on statin Rx secondary to Liver disease.  5. Prostate Cancer with Metastatic Disease to the Bone  - followed by Dr. [name] as outpatient - continue current medication  regimen  Signed, Erma Heritage , PA-C 9:06 AM 08/17/2015 Pager: [phone] The patient remains in atrial fibrillation with rate 110 at rest. No chest pain or dyspnea. Complains of chronic sciatica leg pain for which he takes chronic analgesic oxycodone from Dr. Abner Greenspan. History of chronic alcoholism and continues to drink ("some". Discussed TEE/cardioversion which he favors. Following this we will restart sotolol which he tolerated for many years successfully keeping him out of atrial fibrillation.

## 2015-08-17 NOTE — Progress Notes (Signed)
I had a long discussion with patient and his wife Dorian Pod. Discussed options regarding his atrial fibrillation. As outlined in Dr. Hassell Done note, best option for this man who is not a good candidate for long term anticoagulation will be: 1. TEE Wednesday am. 2. If no atrial thrombi, proceed with DCCV.  3. Following cardioversion start on sotolol 80 mg BID to hold him in rhythm. He tolerated this well in the past and it kept him in NSR for many years. He understands that he will need to be in the hospital for 3 days on telemetry as we institute sotolol. 4. Coumadin or NOAC for a month following DCCV.

## 2015-08-17 NOTE — Plan of Care (Signed)
Problem: Cardiac: Goal: Ability to achieve and maintain adequate cardiopulmonary perfusion will improve Outcome: Progressing Scheduled TEE/DCCV tomorrow

## 2015-08-17 NOTE — Plan of Care (Signed)
Problem: Education: Goal: Understanding of medication regimen will improve Outcome: Progressing Education on cardizem and heparin given

## 2015-08-17 NOTE — Telephone Encounter (Signed)
I cb pt's wife to help try and answer questions that she had. Wife wanted to know was Dr. Burt Knack going to be the dr to check on her husband since he is familiar w/pt. I advised wife looks like [place] checked in on pt this morning. Wife would like to be able to s/w the dr who is caring for pt while he is in the hospital. Wife asked could they give a schedule as to when the dr will be in so that she can s/w them as well. I stated there is really no way to gauge when the dr will be able to get in the room, it just depends on their rounds. My advise was to let the nurse at the hospital know that you would like to be able to s/w the dr when they are there. Wife was agreeable to this plan of care, she did also ask me for my last name, Julaine Hua).

## 2015-08-17 NOTE — Progress Notes (Signed)
ANTICOAGULATION CONSULT NOTE - Follow up [place] for Heparin  Indication: atrial fibrillation  No Known Allergies  Patient Measurements: Height: 6\' 2"  (188 cm) Weight: 217 lb (98.431 kg) IBW/kg (Calculated) : 82.2 Heparin Dosing Weight: 98.4 kg =TBW  Vital Signs: Temp: 98.4 F (36.9 C) (01/10 0446) Temp Source: Oral (01/10 0446) BP: 103/69 mmHg (01/10 0446) Pulse Rate: 86 (01/10 0446)  Labs:  Recent Labs  08/16/15 1804 08/17/15 0830  HGB 11.5* 11.7*  HCT 34.6* 34.6*  PLT 94* 92*  APTT 38*  --   LABPROT 17.4*  --   INR 1.41  --   HEPARINUNFRC  --  0.15*  CREATININE 1.07  --     Estimated Creatinine Clearance: 79 mL/min (by C-G formula based on Cr of 1.07).   Medical History: Past Medical History  Diagnosis Date  . Atrial fibrillation ([place])   . Other and unspecified hyperlipidemia   . Unspecified essential hypertension   . Elevated prostate specific antigen (PSA)   . Personal history of colonic polyps   . Hypocalcemia   . Acute alcoholic hepatitis   . Essential and other specified forms of tremor   . Anxiety state, unspecified   . Family history of malignant neoplasm of gastrointestinal tract   . Alcohol abuse with physiological dependence ([place]) 02/13/2012  . 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 Alcoholic hepatitis with ascites 11/13/2014  . Bacteremia, escherichia coli   . Coronary artery disease     s/p PCI to the LAD in 2011 with a BMS  . Dysrhythmia   . Pneumonia     childhood  . Depression   . Malignant neoplasm of prostate ([place]) 11/2007    Mets to Bone    Medications:  Scheduled:  . clopidogrel  75 mg Oral Daily  . digoxin  0.125 mg Oral Daily  . diltiazem  60 mg Oral 4 times per day  . enzalutamide  120 mg Oral Daily  . FLORA-Q  1 capsule Oral Daily  . folic acid  1 mg Oral Daily  . furosemide  20 mg Oral Daily  . heparin  2,000  Units Intravenous Once  . LORazepam  0-4 mg Oral Q6H   Followed by  . [START ON 08/19/2015] LORazepam  0-4 mg Oral Q12H  . metoprolol tartrate  12.5 mg Oral TID  . multivitamin with minerals  1 tablet Oral Daily  . [START ON 08/18/2015] pneumococcal 23 valent vaccine  0.5 mL Intramuscular Tomorrow-1000  . rifaximin  550 mg Oral BID  . sodium chloride  3 mL Intravenous Q12H  . sodium chloride  3 mL Intravenous Q12H  . spironolactone  25 mg Oral Daily  . sulfamethoxazole-trimethoprim  1 tablet Oral Daily  . thiamine  100 mg Oral Daily    65 y.o male with a hx of CAD status post prior PCI of the LAD in 2011, PAF, metastatic prostate cancer, alcoholism with associated alcohol-related chronic liver disease. Atrial Fibrillation with RVR - He was taken off of Sotalol in 5/16 during an admit for sepsis and low BP. He now presents with recurrent AF with RVR. He is asymptomatic. He is not a candidate for anticoagulation b/c of his hx of alcoholism. However, cardiologist notes need to achieve rate control which will be difficult as outpatient, thus decided to place him on anticoagulation with coumadin for 1-2 months, then TEE-DCCV and resuming Sotalol  would be the best case scenario. Pharmacy consulted to start IV heparin infusion.  Assessment: Heparin level 0.15 on 1400 units/hr, per RN, no interruptions to drip AST 98/ALT 28 Hgb 11.7, pltc 92K (been in 90's since September)  Goal of Therapy:  Heparin level 0.3-0.7 units/ml Monitor platelets by anticoagulation protocol: Yes   Plan:  Heparin bolus 2000 units IV x1 Increase Heparin drip to 1700 units/hr Heparin level in 6 hours. @ 1630 Daily heparin level and CBC.  Thank you for allowing [place] to participate in this patients care. Jens Som, PharmD  08/17/2015,10:07 AM

## 2015-08-18 ENCOUNTER — Ambulatory Visit (HOSPITAL_COMMUNITY): Payer: Medicare Other

## 2015-08-18 ENCOUNTER — Observation Stay (HOSPITAL_COMMUNITY): Payer: Medicare Other | Admitting: Certified Registered Nurse Anesthetist

## 2015-08-18 ENCOUNTER — Encounter (HOSPITAL_COMMUNITY)
Admission: AD | Disposition: A | Discharge status: Home / Self Care | Payer: Medicare Other | Source: Ambulatory Visit | Attending: Interventional Cardiology

## 2015-08-18 ENCOUNTER — Encounter (HOSPITAL_COMMUNITY): Payer: Self-pay | Admitting: Certified Registered Nurse Anesthetist

## 2015-08-18 DIAGNOSIS — I10 Essential (primary) hypertension: Secondary | ICD-10-CM | POA: Diagnosis not present

## 2015-08-18 DIAGNOSIS — I4891 Unspecified atrial fibrillation: Secondary | ICD-10-CM | POA: Diagnosis not present

## 2015-08-18 DIAGNOSIS — I2510 Atherosclerotic heart disease of native coronary artery without angina pectoris: Secondary | ICD-10-CM | POA: Diagnosis not present

## 2015-08-18 DIAGNOSIS — I340 Nonrheumatic mitral (valve) insufficiency: Secondary | ICD-10-CM | POA: Diagnosis not present

## 2015-08-18 DIAGNOSIS — K7031 Alcoholic cirrhosis of liver with ascites: Secondary | ICD-10-CM | POA: Diagnosis not present

## 2015-08-18 DIAGNOSIS — I480 Paroxysmal atrial fibrillation: Secondary | ICD-10-CM | POA: Diagnosis not present

## 2015-08-18 DIAGNOSIS — C7951 Secondary malignant neoplasm of bone: Secondary | ICD-10-CM | POA: Diagnosis not present

## 2015-08-18 HISTORY — PX: CARDIOVERSION: SHX1299

## 2015-08-18 HISTORY — PX: TEE WITHOUT CARDIOVERSION: SHX5443

## 2015-08-18 LAB — BASIC METABOLIC PANEL
Anion gap: 9 (ref 5–15)
BUN: 12 mg/dL (ref 6–20)
CALCIUM: 8.2 mg/dL — AB (ref 8.9–10.3)
CO2: 25 mmol/L (ref 22–32)
CREATININE: 1.09 mg/dL (ref 0.61–1.24)
Chloride: 100 mmol/L — ABNORMAL LOW (ref 101–111)
GFR calc Af Amer: >60 mL/min (ref >60)
GLUCOSE: 103 mg/dL — AB (ref 65–99)
Potassium: 3.9 mmol/L (ref 3.5–5.1)
SODIUM: 134 mmol/L — AB (ref 135–145)

## 2015-08-18 LAB — CBC
HCT: 35.2 % — ABNORMAL LOW (ref 39.0–52.0)
Hemoglobin: 11.1 g/dL — ABNORMAL LOW (ref 13.0–17.0)
MCH: 32.6 pg (ref 26.0–34.0)
MCHC: 31.5 g/dL (ref 30.0–36.0)
MCV: 103.2 fL — AB (ref 78.0–100.0)
PLATELETS: 196 10*3/uL (ref 150–400)
RBC: 3.41 MIL/uL — AB (ref 4.22–5.81)
RDW: 15 % (ref 11.5–15.5)
WBC: 10.5 10*3/uL (ref 4.0–10.5)

## 2015-08-18 LAB — HEPARIN LEVEL (UNFRACTIONATED)
HEPARIN UNFRACTIONATED: 0.22 [IU]/mL — AB (ref 0.30–0.70)
Heparin Unfractionated: 0.44 IU/mL (ref 0.30–0.70)

## 2015-08-18 SURGERY — ECHOCARDIOGRAM, TRANSESOPHAGEAL
Anesthesia: Monitor Anesthesia Care

## 2015-08-18 MED ORDER — BUTAMBEN-TETRACAINE-BENZOCAINE 2-2-14 % EX AERO
INHALATION_SPRAY | CUTANEOUS | Status: DC | PRN
  Administered 2015-08-18: 2 via TOPICAL

## 2015-08-18 MED ORDER — SOTALOL HCL 80 MG PO TABS
80.0000 mg | ORAL_TABLET | Freq: Two times a day (BID) | ORAL | Status: DC
  Administered 2015-08-18 – 2015-08-21 (×7): 80 mg via ORAL
  Filled 2015-08-18 (×7): qty 1

## 2015-08-18 MED ORDER — DABIGATRAN ETEXILATE MESYLATE 150 MG PO CAPS
150.0000 mg | ORAL_CAPSULE | Freq: Two times a day (BID) | ORAL | Status: DC
  Administered 2015-08-18 – 2015-08-21 (×6): 150 mg via ORAL
  Filled 2015-08-18 (×7): qty 1

## 2015-08-18 MED ORDER — HEPARIN BOLUS VIA INFUSION
3000.0000 [IU] | Freq: Once | INTRAVENOUS | Status: AC
  Administered 2015-08-18: 3000 [IU] via INTRAVENOUS
  Filled 2015-08-18: qty 3000

## 2015-08-18 MED ORDER — LIDOCAINE HCL (CARDIAC) 20 MG/ML IV SOLN
INTRAVENOUS | Status: DC | PRN
  Administered 2015-08-18: 50 mg via INTRATRACHEAL

## 2015-08-18 MED ORDER — ASPIRIN EC 81 MG PO TBEC
81.0000 mg | DELAYED_RELEASE_TABLET | Freq: Every day | ORAL | Status: DC
  Administered 2015-08-19 – 2015-08-21 (×3): 81 mg via ORAL
  Filled 2015-08-18 (×3): qty 1

## 2015-08-18 MED ORDER — DILTIAZEM HCL 30 MG PO TABS
30.0000 mg | ORAL_TABLET | Freq: Three times a day (TID) | ORAL | Status: DC
  Administered 2015-08-18 – 2015-08-19 (×2): 30 mg via ORAL
  Filled 2015-08-18 (×2): qty 1

## 2015-08-18 MED ORDER — HEPARIN (PORCINE) IN NACL 100-0.45 UNIT/ML-% IJ SOLN: 2100.0000 [IU]/h | INTRAMUSCULAR | Status: DC

## 2015-08-18 MED ORDER — SODIUM CHLORIDE 0.9 % IV SOLN
INTRAVENOUS | Status: DC
  Administered 2015-08-18: 500 mL via INTRAVENOUS
  Administered 2015-08-18: 02:00:00 via INTRAVENOUS

## 2015-08-18 MED ORDER — PROPOFOL 10 MG/ML IV BOLUS
INTRAVENOUS | Status: DC | PRN
  Administered 2015-08-18: 30 mg via INTRAVENOUS
  Administered 2015-08-18: 20 mg via INTRAVENOUS
  Administered 2015-08-18: 30 mg via INTRAVENOUS
  Administered 2015-08-18: 20 mg via INTRAVENOUS

## 2015-08-18 MED ORDER — PROPOFOL 500 MG/50ML IV EMUL
INTRAVENOUS | Status: DC | PRN
  Administered 2015-08-18: 125 ug/kg/min via INTRAVENOUS

## 2015-08-18 NOTE — Progress Notes (Signed)
Patient Name: Allen Schroeder Date of Encounter: 08/18/2015  Principal Problem:   Atrial fibrillation with RVR ([place]) Active Problems:   CAD, NATIVE VESSEL   Prostate cancer, primary, with metastasis from prostate to other site [facility])    Primary Cardiologist: Dr. Burt Knack Patient Profile: 65 y.o. male w/ PMH of CAD (s/p PCI-LAD in 2011), PAF (not on anticoagulation secondary to alcoholism), prostate cancer metastatic to the bone, and alcoholism with associated alcohol-related chronic liver disease admitted from the office on 08/16/2015 for atrial fibrillation w/ RVR.  SUBJECTIVE: Denies any palpitations. Scheduled for TEE/DCCV at 1300 today. Has been NPO since midnight.  OBJECTIVE Filed Vitals:   08/17/15 2002 08/18/15 0100 08/18/15 0213 08/18/15 0521  BP: 96/53 103/73  115/62  Pulse:    95  Temp: 98 F (36.7 C)   98.1 F (36.7 C)  TempSrc: Oral   Oral  Resp: 18   18  Height:      Weight:    217 lb 6 oz (98.6 kg)  SpO2: 96%  96% 97%    Intake/Output Summary (Last 24 hours) at 08/18/15 0740 Last data filed at 08/18/15 0522  Gross per 24 hour  Intake    120 ml  Output    125 ml  Net     -5 ml   Filed Weights   08/16/15 1658 08/17/15 0446 08/18/15 0521  Weight: 223 lb 6.4 oz (101.334 kg) 217 lb (98.431 kg) 217 lb 6 oz (98.6 kg)    PHYSICAL EXAM General: Well developed, well nourished, male in no acute distress. Head: Normocephalic, atraumatic.  Neck: Supple without bruits, JVD not elevated. Lungs:  Resp regular and unlabored, CTA without wheezing or rales. Heart: Irregularly irregular, S1, S2, no S3, S4, or murmur; no rub. Abdomen: Soft, non-tender, non-distended with normoactive bowel sounds. No hepatomegaly. No rebound/guarding. No obvious abdominal masses. Extremities: No clubbing, cyanosis, or edema. Distal pedal pulses are 2+ bilaterally. Neuro: Alert and oriented X 3. Moves all extremities spontaneously. Psych: Normal affect.   LABS: CBC: Recent Labs  08/16/15 1804 08/17/15 0830 08/18/15 0058  WBC 5.7 5.4 10.5  NEUTROABS 4.2  --   --   HGB 11.5* 11.7* 11.1*  HCT 34.6* 34.6* 35.2*  MCV 106.8* 106.8* 103.2*  PLT 94* 92* 196   INR: Recent Labs  08/16/15 1804  INR [id number]   Basic Metabolic Panel: Recent Labs  08/16/15 1804 08/18/15 0058  NA 134* 134*  K 3.5 3.9  CL 99* 100*  CO2 22 25  GLUCOSE 110* 103*  BUN 9 12  CREATININE 1.07 1.09  CALCIUM 8.8* 8.2*  MG 2.1  --    Liver Function Tests: Recent Labs  08/16/15 1804  AST 98*  ALT 28  ALKPHOS 162*  BILITOT 1.5*  PROT 8.3*  ALBUMIN 3.2*   BNP: No results found for: BNP PRO B NATRIURETIC PEPTIDE (BNP)  Date/Time Value Ref Range Status  01/11/2009 02:15 PM 218.0* 0.0 - 100.0 pg/mL Final   Thyroid Function Tests: Recent Labs  08/16/15 1804  TSH 3.376    TELE:  Atrial fibrillation with rate in 90's - 120's. Peaking in 140's.      ECG: No new tracings.  ECHO: 12/25/2014 Study Conclusions - Left ventricle: The cavity size was normal. Wall thickness was normal. Systolic function was normal. The estimated ejection fraction was in the range of 55% to 60%. - Mitral valve: Calcified annulus. Mildly thickened leaflets . - Left atrium: The atrium was mildly dilated. -  Atrial septum: No defect or patent foramen ovale was identified. - Pericardium, extracardiac: There was a left pleural effusion.   Current Medications:  . clopidogrel  75 mg Oral Daily  . digoxin  0.125 mg Oral Daily  . diltiazem  60 mg Oral 4 times per day  . enzalutamide  120 mg Oral Daily  . FLORA-Q  1 capsule Oral Daily  . folic acid  1 mg Oral Daily  . furosemide  20 mg Oral Daily  . LORazepam  0-4 mg Oral Q6H   Followed by  . [START ON 08/19/2015] LORazepam  0-4 mg Oral Q12H  . metoprolol tartrate  12.5 mg Oral TID  . multivitamin with minerals  1 tablet Oral Daily  . pneumococcal 23 valent vaccine  0.5 mL Intramuscular Tomorrow-1000  . rifaximin  550 mg Oral BID  . sodium  chloride  3 mL Intravenous Q12H  . sodium chloride  3 mL Intravenous Q12H  . spironolactone  25 mg Oral Daily  . sulfamethoxazole-trimethoprim  1 tablet Oral Daily  . thiamine  100 mg Oral Daily   . sodium chloride 20 mL/hr at 08/18/15 0215  . sodium chloride    . heparin 2,100 Units/hr (08/18/15 0207)    ASSESSMENT AND PLAN: 1. Atrial Fibrillation with RVR  - History of atrial fibrillation 6+ years ago. Underwent successful TEE/DCCV then. Taken off Sotalol in 5/16 during an admit for sepsis and low BP. Found to be in recurrent AF with RVR on 08/16/2015 during his routine office visit.  - BP is soft and limits rate controlling agents.Started on Dig 0.125 mg daily and Metoprolol 12.5mg  TID. - Cardizem drip at 10mg /hr was also started. SBP has been in the 90's - 120's since admission. Currently on  short-acting Cardizem 60mg  QID. MD to consider switching to long-acting Cardizem following Cardioversion vs. Discontinuing as we start Sotolol. - This patients CHA2DS2-VASc Score and unadjusted Ischemic Stroke Rate (% per year) is equal to 2.2 % stroke rate/year from a score of 2 (Vascular, Age). Not deemed a candidate for anticoagulation in the past due to alcoholism. Currently on Heparin. Will be on Coumadin or NOAC for one month following TEE/DCCV today. - TEE/DCCV scheduled for 1300 today. He will be started on Sotolol 80mg  BID following cardioversion and will be to be on telemetry for 3 days following initiation of the medication.  2. CAD - s/p PCI to the LAD in 2011 with a BMS.  - denies any recent anginal symptoms. - He remains on Plavix.He is not on statin Rx given hx of liver disease.   3. Alcoholism  - has mild tremor. - on CIWA protocol while admitted.   4. HLD  - No longer on statin Rx secondary to Liver disease.  5. Prostate Cancer with Metastatic Disease to the Bone  - followed by Dr. [name] as outpatient - continue current medication  regimen   Signed, Erma Heritage , PA-C 7:40 AM 08/18/2015 Pager: [phone] Agree with above assessment and plan. Patient has no new symptoms this am. Awaiting TEE/cardioversion. Heart rate still 110 range at rest. Potassium okay this am. Lungs clear. Heart no gallop. Plan: as above.

## 2015-08-18 NOTE — Transfer of Care (Signed)
Immediate Anesthesia Transfer of Care Note  Patient: Allen Schroeder  Procedure(s) Performed: Procedure(s): TRANSESOPHAGEAL ECHOCARDIOGRAM (TEE) (N/A) CARDIOVERSION (N/A)  Patient Location: PACU and Endoscopy Unit  Anesthesia Type:MAC  Level of Consciousness: awake and alert   Airway & Oxygen Therapy: Patient Spontanous Breathing and Patient connected to nasal cannula oxygen  Post-op Assessment: Report given to RN and Post -op Vital signs reviewed and stable  Post vital signs: Reviewed and stable  Last Vitals:  Filed Vitals:   08/18/15 1050 08/18/15 1231  BP: 103/85 113/76  Pulse: 82 111  Temp:  37.3 C  Resp:  20    Complications: No apparent anesthesia complications

## 2015-08-18 NOTE — Discharge Instructions (Addendum)
Information on my medicine - Pradaxa® (dabigatran) ° °This medication education was reviewed with me or my healthcare representative as part of my discharge preparation.  The pharmacist that spoke with me during my hospital stay was:  Meyer, Andrew David, RPH ° °Why was Pradaxa® prescribed for you? °Pradaxa® was prescribed for you to reduce the risk of forming blood clots that cause a stroke if you have a medical condition called atrial fibrillation (a type of irregular heartbeat).   ° °What do you Need to know about PradAXa®? °Take your Pradaxa® TWICE DAILY - one capsule in the morning and one tablet in the evening with or without food.  It would be best to take the doses about the same time each day. ° °The capsules should not be broken, chewed or opened - they must be swallowed whole. ° °Do not store Pradaxa in other medication containers - once the bottle is opened the Pradaxa should be used within FOUR months; throw away any capsules that haven’t been by that time. ° °Take Pradaxa® exactly as prescribed by your doctor.  DO NOT stop taking Pradaxa® without talking to the doctor who prescribed the medication.  Stopping without other stroke prevention medication to take the place of Pradaxa may increase your risk of developing a clot that causes a stroke.  Refill your prescription before you run out. ° °After discharge, you should have regular check-up appointments with your healthcare provider that is prescribing your Pradaxa®.  In the future your dose may need to be changed if your kidney function or weight changes by a significant amount. ° °What do you do if you miss a dose? °If you miss a dose, take it as soon as you remember on the same day.  If your next dose is less than 6 hours away, skip the missed dose.  Do not take two doses of PRADAXA at the same time. ° °Important Safety Information °A possible side effect of Pradaxa® is bleeding. You should call your healthcare provider right away if you experience  any of the following: °? Bleeding from an injury or your nose that does not stop. °? Unusual colored urine (red or dark brown) or unusual colored stools (red or black). °? Unusual bruising for unknown reasons. °? A serious fall or if you hit your head (even if there is no bleeding). ° °Some medicines may interact with Pradaxa® and might increase your risk of bleeding or clotting while on Pradaxa®. To help avoid this, consult your healthcare provider or pharmacist prior to using any new prescription or non-prescription medications, including herbals, vitamins, non-steroidal anti-inflammatory drugs (NSAIDs) and supplements. ° °This website has more information on Pradaxa® (dabigatran): [email or web address]://www.pradaxa.com ° ° ° °

## 2015-08-18 NOTE — Progress Notes (Addendum)
ANTICOAGULATION CONSULT NOTE - Follow up [place] for Heparin, coumadin  Indication: atrial fibrillation  No Known Allergies  Patient Measurements: Height: 6\' 2"  (188 cm) Weight: 217 lb (98.431 kg) IBW/kg (Calculated) : 82.2 Heparin Dosing Weight: 98.4 kg =TBW  Vital Signs: Temp: 98.2 F (36.8 C) (01/11 1423) Temp Source: Oral (01/11 1423) BP: 102/64 mmHg (01/11 1423) Pulse Rate: 78 (01/11 1423)  Labs:  Recent Labs  08/16/15 1804  08/17/15 0830 08/17/15 1611 08/18/15 0058 08/18/15 0900  HGB 11.5*  --  11.7*  --  11.1*  --   HCT 34.6*  --  34.6*  --  35.2*  --   PLT 94*  --  92*  --  196  --   APTT 38*  --   --   --   --   --   LABPROT 17.4*  --   --   --   --   --   INR 1.41  --   --   --   --   --   HEPARINUNFRC  --   < > 0.15* 0.23* 0.22* 0.44  CREATININE 1.07  --   --   --  1.09  --   < > = values in this interval not displayed.  Estimated Creatinine Clearance: 77.5 mL/min (by C-G formula based on Cr of 1.09).  Medications:  Scheduled:  . clopidogrel  75 mg Oral Daily  . digoxin  0.125 mg Oral Daily  . diltiazem  30 mg Oral 3 times per day  . enzalutamide  120 mg Oral Daily  . FLORA-Q  1 capsule Oral Daily  . folic acid  1 mg Oral Daily  . furosemide  20 mg Oral Daily  . LORazepam  0-4 mg Oral Q6H   Followed by  . [START ON 08/19/2015] LORazepam  0-4 mg Oral Q12H  . multivitamin with minerals  1 tablet Oral Daily  . pneumococcal 23 valent vaccine  0.5 mL Intramuscular Tomorrow-1000  . rifaximin  550 mg Oral BID  . sodium chloride  3 mL Intravenous Q12H  . sodium chloride  3 mL Intravenous Q12H  . sotalol  80 mg Oral Q12H  . spironolactone  25 mg Oral Daily  . sulfamethoxazole-trimethoprim  1 tablet Oral Daily  . thiamine  100 mg Oral Daily    65 y.o male here with afib and on heparin (heparin level is at goal).Now s/p successful cardioversion. Spoke with Dr. Mare Ferrari and plans to begin pradaxa (due to interaction with [facility] and  patient preference). Will also discontinue plavix and begin aspirin   Goal of Therapy:  Heparin level 0.3-0.7 units/ml Monitor platelets by anticoagulation protocol: Yes   Plan:  -Stop heparin  -stop plavix and begin low dose ASA -Pradaxa 150mg  po bid  -Will provide patient education  Hildred Laser, Pharm D 08/18/2015 3:14 PM

## 2015-08-18 NOTE — Progress Notes (Signed)
Patient in NSR. After discussion with pharmacy we will go with Pradaxa to minimize interactions with his chemotherapy drugs.

## 2015-08-18 NOTE — Progress Notes (Signed)
*  PRELIMINARY RESULTS* Echocardiogram 2D Echocardiogram has been performed.  Leavy Cella 08/18/2015, 1:39 PM

## 2015-08-18 NOTE — H&P (View-Only) (Signed)
Patient Name: Allen Schroeder Date of Encounter: 08/18/2015  Principal Problem:   Atrial fibrillation with RVR ([place]) Active Problems:   CAD, NATIVE VESSEL   Prostate cancer, primary, with metastasis from prostate to other site [facility])    Primary Cardiologist: Dr. Burt Knack Patient Profile: 66 y.o. male w/ PMH of CAD (s/p PCI-LAD in 2011), PAF (not on anticoagulation secondary to alcoholism), prostate cancer metastatic to the bone, and alcoholism with associated alcohol-related chronic liver disease admitted from the office on 08/16/2015 for atrial fibrillation w/ RVR.  SUBJECTIVE: Denies any palpitations. Scheduled for TEE/DCCV at 1300 today. Has been NPO since midnight.  OBJECTIVE Filed Vitals:   08/17/15 2002 08/18/15 0100 08/18/15 0213 08/18/15 0521  BP: 96/53 103/73  115/62  Pulse:    95  Temp: 98 F (36.7 C)   98.1 F (36.7 C)  TempSrc: Oral   Oral  Resp: 18   18  Height:      Weight:    217 lb 6 oz (98.6 kg)  SpO2: 96%  96% 97%    Intake/Output Summary (Last 24 hours) at 08/18/15 0740 Last data filed at 08/18/15 0522  Gross per 24 hour  Intake    120 ml  Output    125 ml  Net     -5 ml   Filed Weights   08/16/15 1658 08/17/15 0446 08/18/15 0521  Weight: 223 lb 6.4 oz (101.334 kg) 217 lb (98.431 kg) 217 lb 6 oz (98.6 kg)    PHYSICAL EXAM General: Well developed, well nourished, male in no acute distress. Head: Normocephalic, atraumatic.  Neck: Supple without bruits, JVD not elevated. Lungs:  Resp regular and unlabored, CTA without wheezing or rales. Heart: Irregularly irregular, S1, S2, no S3, S4, or murmur; no rub. Abdomen: Soft, non-tender, non-distended with normoactive bowel sounds. No hepatomegaly. No rebound/guarding. No obvious abdominal masses. Extremities: No clubbing, cyanosis, or edema. Distal pedal pulses are 2+ bilaterally. Neuro: Alert and oriented X 3. Moves all extremities spontaneously. Psych: Normal affect.   LABS: CBC: Recent Labs  08/16/15 1804 08/17/15 0830 08/18/15 0058  WBC 5.7 5.4 10.5  NEUTROABS 4.2  --   --   HGB 11.5* 11.7* 11.1*  HCT 34.6* 34.6* 35.2*  MCV 106.8* 106.8* 103.2*  PLT 94* 92* 196   INR: Recent Labs  08/16/15 1804  INR [id number]   Basic Metabolic Panel: Recent Labs  08/16/15 1804 08/18/15 0058  NA 134* 134*  K 3.5 3.9  CL 99* 100*  CO2 22 25  GLUCOSE 110* 103*  BUN 9 12  CREATININE 1.07 1.09  CALCIUM 8.8* 8.2*  MG 2.1  --    Liver Function Tests: Recent Labs  08/16/15 1804  AST 98*  ALT 28  ALKPHOS 162*  BILITOT 1.5*  PROT 8.3*  ALBUMIN 3.2*   BNP: No results found for: BNP PRO B NATRIURETIC PEPTIDE (BNP)  Date/Time Value Ref Range Status  01/11/2009 02:15 PM 218.0* 0.0 - 100.0 pg/mL Final   Thyroid Function Tests: Recent Labs  08/16/15 1804  TSH 3.376    TELE:  Atrial fibrillation with rate in 90's - 120's. Peaking in 140's.      ECG: No new tracings.  ECHO: 12/25/2014 Study Conclusions - Left ventricle: The cavity size was normal. Wall thickness was normal. Systolic function was normal. The estimated ejection fraction was in the range of 55% to 60%. - Mitral valve: Calcified annulus. Mildly thickened leaflets . - Left atrium: The atrium was mildly dilated. -  Atrial septum: No defect or patent foramen ovale was identified. - Pericardium, extracardiac: There was a left pleural effusion.   Current Medications:  . clopidogrel  75 mg Oral Daily  . digoxin  0.125 mg Oral Daily  . diltiazem  60 mg Oral 4 times per day  . enzalutamide  120 mg Oral Daily  . FLORA-Q  1 capsule Oral Daily  . folic acid  1 mg Oral Daily  . furosemide  20 mg Oral Daily  . LORazepam  0-4 mg Oral Q6H   Followed by  . [START ON 08/19/2015] LORazepam  0-4 mg Oral Q12H  . metoprolol tartrate  12.5 mg Oral TID  . multivitamin with minerals  1 tablet Oral Daily  . pneumococcal 23 valent vaccine  0.5 mL Intramuscular Tomorrow-1000  . rifaximin  550 mg Oral BID  . sodium  chloride  3 mL Intravenous Q12H  . sodium chloride  3 mL Intravenous Q12H  . spironolactone  25 mg Oral Daily  . sulfamethoxazole-trimethoprim  1 tablet Oral Daily  . thiamine  100 mg Oral Daily   . sodium chloride 20 mL/hr at 08/18/15 0215  . sodium chloride    . heparin 2,100 Units/hr (08/18/15 0207)    ASSESSMENT AND PLAN: 1. Atrial Fibrillation with RVR  - History of atrial fibrillation 6+ years ago. Underwent successful TEE/DCCV then. Taken off Sotalol in 5/16 during an admit for sepsis and low BP. Found to be in recurrent AF with RVR on 08/16/2015 during his routine office visit.  - BP is soft and limits rate controlling agents.Started on Dig 0.125 mg daily and Metoprolol 12.5mg  TID. - Cardizem drip at 10mg /hr was also started. SBP has been in the 90's - 120's since admission. Currently on  short-acting Cardizem 60mg  QID. MD to consider switching to long-acting Cardizem following Cardioversion vs. Discontinuing as we start Sotolol. - This patients CHA2DS2-VASc Score and unadjusted Ischemic Stroke Rate (% per year) is equal to 2.2 % stroke rate/year from a score of 2 (Vascular, Age). Not deemed a candidate for anticoagulation in the past due to alcoholism. Currently on Heparin. Will be on Coumadin or NOAC for one month following TEE/DCCV today. - TEE/DCCV scheduled for 1300 today. He will be started on Sotolol 80mg  BID following cardioversion and will be to be on telemetry for 3 days following initiation of the medication.  2. CAD - s/p PCI to the LAD in 2011 with a BMS.  - denies any recent anginal symptoms. - He remains on Plavix.He is not on statin Rx given hx of liver disease.   3. Alcoholism  - has mild tremor. - on CIWA protocol while admitted.   4. HLD  - No longer on statin Rx secondary to Liver disease.  5. Prostate Cancer with Metastatic Disease to the Bone  - followed by Dr. [name] as outpatient - continue current medication  regimen   Signed, Erma Heritage , PA-C 7:40 AM 08/18/2015 Pager: [phone] Agree with above assessment and plan. Patient has no new symptoms this am. Awaiting TEE/cardioversion. Heart rate still 110 range at rest. Potassium okay this am. Lungs clear. Heart no gallop. Plan: as above.

## 2015-08-18 NOTE — Progress Notes (Signed)
ANTICOAGULATION CONSULT NOTE - Follow up [place] for Heparin  Indication: atrial fibrillation  No Known Allergies  Patient Measurements: Height: 6\' 2"  (188 cm) Weight: 217 lb 6 oz (98.6 kg) IBW/kg (Calculated) : 82.2 Heparin Dosing Weight: 98.4 kg =TBW  Vital Signs: Temp: 98.1 F (36.7 C) (01/11 0521) Temp Source: Oral (01/11 0521) BP: 115/62 mmHg (01/11 0521) Pulse Rate: 95 (01/11 0521)  Labs:  Recent Labs  08/16/15 1804  08/17/15 0830 08/17/15 1611 08/18/15 0058 08/18/15 0900  HGB 11.5*  --  11.7*  --  11.1*  --   HCT 34.6*  --  34.6*  --  35.2*  --   PLT 94*  --  92*  --  196  --   APTT 38*  --   --   --   --   --   LABPROT 17.4*  --   --   --   --   --   INR 1.41  --   --   --   --   --   HEPARINUNFRC  --   < > 0.15* 0.23* 0.22* 0.44  CREATININE 1.07  --   --   --  1.09  --   < > = values in this interval not displayed.  Estimated Creatinine Clearance: 77.5 mL/min (by C-G formula based on Cr of 1.09).  Medications:  Scheduled:  . clopidogrel  75 mg Oral Daily  . digoxin  0.125 mg Oral Daily  . diltiazem  60 mg Oral 4 times per day  . enzalutamide  120 mg Oral Daily  . FLORA-Q  1 capsule Oral Daily  . folic acid  1 mg Oral Daily  . furosemide  20 mg Oral Daily  . LORazepam  0-4 mg Oral Q6H   Followed by  . [START ON 08/19/2015] LORazepam  0-4 mg Oral Q12H  . metoprolol tartrate  12.5 mg Oral TID  . multivitamin with minerals  1 tablet Oral Daily  . pneumococcal 23 valent vaccine  0.5 mL Intramuscular Tomorrow-1000  . rifaximin  550 mg Oral BID  . sodium chloride  3 mL Intravenous Q12H  . sodium chloride  3 mL Intravenous Q12H  . spironolactone  25 mg Oral Daily  . sulfamethoxazole-trimethoprim  1 tablet Oral Daily  . thiamine  100 mg Oral Daily    65 y.o male here with afib and on heparin (heparin level is at goal). Plans noted for cardioversion today then plans for NOAC or coumadin. -HL= 0.44, Hg= 11.1, plt= 196  Goal of Therapy:   Heparin level 0.3-0.7 units/ml Monitor platelets by anticoagulation protocol: Yes   Plan:  -No heparin changes needed -Will confirm a heparin level today -If choosing a NOAC, consider pradaxa (apixiban and rivaroxaban interacts with Gillermina Phy) -Daily heparin level and CBC  Hildred Laser, Pharm D 08/18/2015 10:14 AM

## 2015-08-18 NOTE — Interval H&P Note (Signed)
History and Physical Interval Note:  08/18/2015 12:16 PM  Allen Schroeder  has presented today for surgery, with the diagnosis of AFIB  The various methods of treatment have been discussed with the patient and family. After consideration of risks, benefits and other options for treatment, the patient has consented to  Procedure(s): TRANSESOPHAGEAL ECHOCARDIOGRAM (TEE) (N/A) CARDIOVERSION (N/A) as a surgical intervention .  The patient's history has been reviewed, patient examined, no change in status, stable for surgery.  I have reviewed the patient's chart and labs.  Questions were answered to the patient's satisfaction.     Nahser, Wonda Cheng

## 2015-08-18 NOTE — Anesthesia Preprocedure Evaluation (Addendum)
Anesthesia Evaluation  Patient identified by MRN, date of birth, ID band Patient awake    Reviewed: Allergy & Precautions, H&P , NPO status , Patient's Chart, lab work & pertinent test results  Airway Mallampati: III  TM Distance: >3 FB Neck ROM: Full    Dental no notable dental hx. (+) Teeth Intact, Dental Advisory Given   Pulmonary neg pulmonary ROS, former smoker,    Pulmonary exam normal breath sounds clear to auscultation       Cardiovascular hypertension, Pt. on medications + CAD and + Cardiac Stents  + dysrhythmias Atrial Fibrillation  Rhythm:Irregular Rate:Normal     Neuro/Psych Anxiety Depression negative neurological ROS     GI/Hepatic negative GI ROS, (+)     substance abuse  alcohol use, Hepatitis -  Endo/Other  negative endocrine ROS  Renal/GU negative Renal ROS  negative genitourinary   Musculoskeletal   Abdominal   Peds  Hematology negative hematology ROS (+)   Anesthesia Other Findings   Reproductive/Obstetrics negative OB ROS                            Anesthesia Physical Anesthesia Plan  ASA: III  Anesthesia Plan: MAC   Post-op Pain Management:    Induction: Intravenous  Airway Management Planned: Nasal Cannula  Additional Equipment:   Intra-op Plan:   Post-operative Plan:   Informed Consent: I have reviewed the patients History and Physical, chart, labs and discussed the procedure including the risks, benefits and alternatives for the proposed anesthesia with the patient or authorized representative who has indicated his/her understanding and acceptance.   Dental advisory given  Plan Discussed with: CRNA  Anesthesia Plan Comments:         Anesthesia Quick Evaluation

## 2015-08-18 NOTE — CV Procedure (Signed)
    Transesophageal Echocardiogram Note  Allen Schroeder [id number] 1951/06/25  Procedure: Transesophageal Echocardiogram Indications: atrial fib   Procedure Details Consent: Obtained Time Out: Verified patient identification, verified procedure, site/side was marked, verified correct patient position, special equipment/implants available, Radiology Safety Procedures followed,  medications/allergies/relevent history reviewed, required imaging and test results available.  Performed  Medications: Propofol 300 mg iv for TEE and cardioversion   Left Ventrical:  Normal LV function   Mitral Valve: mild-mod MR   Aortic Valve: normal   Tricuspid Valve: normal   Pulmonic Valve: normal   Left Atrium/ Left atrial appendage: no thrombi   Atrial septum: intact by doppler   Aorta: normal    Complications: No apparent complications Patient did tolerate procedure well.     Cardioversion Note  Allen Schroeder [id number] 1951-02-09  Procedure: DC Cardioversion Indications: atrial fib   Procedure Details Consent: Obtained Time Out: Verified patient identification, verified procedure, site/side was marked, verified correct patient position, special equipment/implants available, Radiology Safety Procedures followed,  medications/allergies/relevent history reviewed, required imaging and test results available.  Performed  The patient has been on adequate anticoagulation.  The patient received Propofol  for sedation ( see above ) .  Synchronous cardioversion was performed at 120  joules.  The cardioversion was successful   Will transition to Eliquis 5 BID To start Sotolol    Complications: No apparent complications Patient did tolerate procedure well.   Thayer Headings, Brooke Bonito., MD, [facility] 08/18/2015, 1:21 PM

## 2015-08-18 NOTE — Anesthesia Postprocedure Evaluation (Signed)
Anesthesia Post Note  Patient: Allen Schroeder  Procedure(s) Performed: Procedure(s) (LRB): TRANSESOPHAGEAL ECHOCARDIOGRAM (TEE) (N/A) CARDIOVERSION (N/A)  Patient location during evaluation: PACU Anesthesia Type: MAC Level of consciousness: awake and alert Pain management: pain level controlled Vital Signs Assessment: post-procedure vital signs reviewed and stable Respiratory status: spontaneous breathing, nonlabored ventilation and respiratory function stable Cardiovascular status: stable and blood pressure returned to baseline Anesthetic complications: no    Last Vitals:  Filed Vitals:   08/18/15 1340 08/18/15 1350  BP: 106/51 108/48  Pulse: 79 80  Temp:    Resp: 14 18    Last Pain:  Filed Vitals:   08/18/15 1353  PainSc: 0-No pain                 Tanvir Hipple,W. EDMOND

## 2015-08-18 NOTE — Care Management Note (Signed)
Case Management Note  Patient Details  Name: Allen Schroeder MRN: [id number] Date of Birth: 05-03-1951  Subjective/Objective:    Pt admitted A fib                Action/Plan:  Pt is independent from home.  Pt will discharge home on Pradaxa, CM will assist with medication post discharge.   Expected Discharge Date:                  Expected Discharge Plan:  Home/Self Care  In-House Referral:     Discharge planning Services  CM Consult  Post Acute Care Choice:    Choice offered to:     DME Arranged:    DME Agency:     HH Arranged:    HH Agency:     Status of Service:  In process, will continue to follow  Medicare Important Message Given:    Date Medicare IM Given:    Medicare IM give by:    Date Additional Medicare IM Given:    Additional Medicare Important Message give by:     If discussed at [place] of Stay Meetings, dates discussed:    Additional Comments: CM submitted benefit check  Per rep at [facility],  Tier 4 medication, patient will pay 50% of total cost of medication. No auth required. Patient can use any retail pharmacy   CM informed pt of copay approximately $180 per month, pt stated he could pay it without hardship.  CM contacted preferred pharmacy [facility] on [address], they are able to fill prescription. Maryclare Labrador, RN 08/18/2015, 3:54 PM

## 2015-08-18 NOTE — Anesthesia Procedure Notes (Signed)
Procedure Name: MAC Date/Time: 08/18/2015 1:05 PM Performed by: [place] Pink Pre-anesthesia Checklist: Patient identified, Emergency Drugs available, Suction available, Patient being monitored and Timeout performed Patient Re-evaluated:Patient Re-evaluated prior to inductionOxygen Delivery Method: Nasal cannula Preoxygenation: Pre-oxygenation with 100% oxygen Placement Confirmation: positive ETCO2 Dental Injury: Teeth and Oropharynx as per pre-operative assessment

## 2015-08-18 NOTE — Progress Notes (Signed)
ANTICOAGULATION CONSULT NOTE - Follow up [place] for Heparin  Indication: atrial fibrillation  No Known Allergies  Patient Measurements: Height: 6\' 2"  (188 cm) Weight: 217 lb (98.431 kg) IBW/kg (Calculated) : 82.2  Vital Signs: Temp: 98 F (36.7 C) (01/10 2002) Temp Source: Oral (01/10 2002) BP: 103/73 mmHg (01/11 0100)  Labs:  Recent Labs  08/16/15 1804 08/17/15 0830 08/17/15 1611 08/18/15 0058  HGB 11.5* 11.7*  --  11.1*  HCT 34.6* 34.6*  --  35.2*  PLT 94* 92*  --  196  APTT 38*  --   --   --   LABPROT 17.4*  --   --   --   INR 1.41  --   --   --   HEPARINUNFRC  --  0.15* 0.23* 0.22*  CREATININE 1.07  --   --   --     Estimated Creatinine Clearance: 79 mL/min (by C-G formula based on Cr of 1.07).  Assessment: 65 y.o. male with Afib for heparin Goal of Therapy:  Heparin level 0.3-0.7 units/ml Monitor platelets by anticoagulation protocol: Yes   Plan:  Heparin 3000 units IV bolus, then increase heparin 2100 units/hr Check heparin level in 8 hours.  Phillis Knack, PharmD, BCPS

## 2015-08-19 ENCOUNTER — Encounter (HOSPITAL_COMMUNITY): Payer: Self-pay | Admitting: Cardiovascular Disease

## 2015-08-19 DIAGNOSIS — K7031 Alcoholic cirrhosis of liver with ascites: Secondary | ICD-10-CM | POA: Diagnosis not present

## 2015-08-19 DIAGNOSIS — Z7902 Long term (current) use of antithrombotics/antiplatelets: Secondary | ICD-10-CM | POA: Diagnosis not present

## 2015-08-19 DIAGNOSIS — C61 Malignant neoplasm of prostate: Secondary | ICD-10-CM | POA: Diagnosis not present

## 2015-08-19 DIAGNOSIS — C7951 Secondary malignant neoplasm of bone: Secondary | ICD-10-CM | POA: Diagnosis not present

## 2015-08-19 DIAGNOSIS — E785 Hyperlipidemia, unspecified: Secondary | ICD-10-CM | POA: Diagnosis present

## 2015-08-19 DIAGNOSIS — Z9049 Acquired absence of other specified parts of digestive tract: Secondary | ICD-10-CM | POA: Diagnosis not present

## 2015-08-19 DIAGNOSIS — Z955 Presence of coronary angioplasty implant and graft: Secondary | ICD-10-CM | POA: Diagnosis not present

## 2015-08-19 DIAGNOSIS — F1020 Alcohol dependence, uncomplicated: Secondary | ICD-10-CM | POA: Diagnosis present

## 2015-08-19 DIAGNOSIS — I10 Essential (primary) hypertension: Secondary | ICD-10-CM | POA: Diagnosis not present

## 2015-08-19 DIAGNOSIS — Z8546 Personal history of malignant neoplasm of prostate: Secondary | ICD-10-CM | POA: Diagnosis not present

## 2015-08-19 DIAGNOSIS — R251 Tremor, unspecified: Secondary | ICD-10-CM | POA: Diagnosis present

## 2015-08-19 DIAGNOSIS — D6489 Other specified anemias: Secondary | ICD-10-CM | POA: Diagnosis present

## 2015-08-19 DIAGNOSIS — Z87891 Personal history of nicotine dependence: Secondary | ICD-10-CM | POA: Diagnosis not present

## 2015-08-19 DIAGNOSIS — Z79899 Other long term (current) drug therapy: Secondary | ICD-10-CM | POA: Diagnosis not present

## 2015-08-19 DIAGNOSIS — I480 Paroxysmal atrial fibrillation: Secondary | ICD-10-CM | POA: Diagnosis not present

## 2015-08-19 DIAGNOSIS — I4891 Unspecified atrial fibrillation: Secondary | ICD-10-CM | POA: Diagnosis not present

## 2015-08-19 DIAGNOSIS — I2510 Atherosclerotic heart disease of native coronary artery without angina pectoris: Secondary | ICD-10-CM | POA: Diagnosis not present

## 2015-08-19 DIAGNOSIS — D539 Nutritional anemia, unspecified: Secondary | ICD-10-CM | POA: Diagnosis present

## 2015-08-19 DIAGNOSIS — Z9852 Vasectomy status: Secondary | ICD-10-CM | POA: Diagnosis not present

## 2015-08-19 LAB — CBC
HCT: 30.6 % — ABNORMAL LOW (ref 39.0–52.0)
Hemoglobin: 9.9 g/dL — ABNORMAL LOW (ref 13.0–17.0)
MCH: 34.7 pg — AB (ref 26.0–34.0)
MCHC: 32.4 g/dL (ref 30.0–36.0)
MCV: 107.4 fL — ABNORMAL HIGH (ref 78.0–100.0)
PLATELETS: 86 10*3/uL — AB (ref 150–400)
RBC: 2.85 MIL/uL — AB (ref 4.22–5.81)
RDW: 16.3 % — ABNORMAL HIGH (ref 11.5–15.5)
WBC: 5.4 10*3/uL (ref 4.0–10.5)

## 2015-08-19 NOTE — Progress Notes (Signed)
Patient Name: Allen Schroeder Date of Encounter: 08/19/2015  Principal Problem:   Atrial fibrillation with RVR ([place]) Active Problems:   CAD, NATIVE VESSEL   Prostate cancer, primary, with metastasis from prostate to other site [facility])    Primary Cardiologist: Dr. Burt Knack Patient Profile: 65 y.o. male w/ PMH of CAD (s/p PCI-LAD in 2011), PAF (not on anticoagulation secondary to alcoholism), prostate cancer metastatic to the bone, and alcoholism with associated alcohol-related chronic liver disease admitted from the office on 08/16/2015 for atrial fibrillation w/ RVR. Successful TEE/DCCV on 08/18/2015.  SUBJECTIVE: Denies any chest pain, palpitations, or dyspnea. Reports no complications following his procedure yesterday.  OBJECTIVE Filed Vitals:   08/18/15 2045 08/18/15 2259 08/19/15 0500 08/19/15 0510  BP: 109/60 121/73  100/59  Pulse: 79   71  Temp: 98.3 F (36.8 C)   98.4 F (36.9 C)  TempSrc: Oral   Oral  Resp: 18   18  Height:      Weight:   217 lb 9.6 oz (98.703 kg)   SpO2: 97%   97%    Intake/Output Summary (Last 24 hours) at 08/19/15 0738 Last data filed at 08/18/15 2045  Gross per 24 hour  Intake    480 ml  Output      0 ml  Net    480 ml   Filed Weights   08/18/15 0521 08/18/15 1231 08/19/15 0500  Weight: 217 lb 6 oz (98.6 kg) 217 lb (98.431 kg) 217 lb 9.6 oz (98.703 kg)    PHYSICAL EXAM General: Well developed, well nourished, male in no acute distress. Head: Normocephalic, atraumatic.  Neck: Supple without bruits, JVD not elevated. Lungs:  Resp regular and unlabored, CTA without wheezing or rales. Heart: RRR, S1, S2, no S3, S4, or murmur; no rub. Abdomen: Soft, non-tender, non-distended with normoactive bowel sounds. No hepatomegaly. No rebound/guarding. No obvious abdominal masses. Extremities: No clubbing, cyanosis, or edema. Distal pedal pulses are 2+ bilaterally. Neuro: Alert and oriented X 3. Moves all extremities spontaneously. Psych: Normal  affect.   LABS: CBC: Recent Labs  08/16/15 1804  08/18/15 0058 08/19/15 0340  WBC 5.7  < > 10.5 5.4  NEUTROABS 4.2  --   --   --   HGB 11.5*  < > 11.1* 9.9*  HCT 34.6*  < > 35.2* 30.6*  MCV 106.8*  < > 103.2* 107.4*  PLT 94*  < > 196 86*  < > = values in this interval not displayed. INR:  Recent Labs  08/16/15 1804  INR [id number]   Basic Metabolic Panel:  Recent Labs  08/16/15 1804 08/18/15 0058  NA 134* 134*  K 3.5 3.9  CL 99* 100*  CO2 22 25  GLUCOSE 110* 103*  BUN 9 12  CREATININE 1.07 1.09  CALCIUM 8.8* 8.2*  MG 2.1  --    Liver Function Tests:  Recent Labs  08/16/15 1804  AST 98*  ALT 28  ALKPHOS 162*  BILITOT 1.5*  PROT 8.3*  ALBUMIN 3.2*   BNP: No results found for: BNP PRO B NATRIURETIC PEPTIDE (BNP)  Date/Time Value Ref Range Status  01/11/2009 02:15 PM 218.0* 0.0 - 100.0 pg/mL Final   Thyroid Function Tests:  Recent Labs  08/16/15 1804  TSH 3.376    TELE: NSR with rate in 70's - 80's. QTc in 510's at times.    ECG: NSR, HR 72. QTc in 490's.   ECHO: 12/25/2014 Study Conclusions - Left ventricle: The cavity size was  normal. Wall thickness was normal. Systolic function was normal. The estimated ejection fraction was in the range of 55% to 60%. - Mitral valve: Calcified annulus. Mildly thickened leaflets . - Left atrium: The atrium was mildly dilated. - Atrial septum: No defect or patent foramen ovale was identified. - Pericardium, extracardiac: There was a left pleural effusion.   Current Medications:  . aspirin EC  81 mg Oral Daily  . dabigatran  150 mg Oral BID  . digoxin  0.125 mg Oral Daily  . diltiazem  30 mg Oral 3 times per day  . enzalutamide  120 mg Oral Daily  . FLORA-Q  1 capsule Oral Daily  . folic acid  1 mg Oral Daily  . furosemide  20 mg Oral Daily  . LORazepam  0-4 mg Oral Q6H   Followed by  . LORazepam  0-4 mg Oral Q12H  . multivitamin with minerals  1 tablet Oral Daily  . pneumococcal 23 valent vaccine   0.5 mL Intramuscular Tomorrow-1000  . rifaximin  550 mg Oral BID  . sodium chloride  3 mL Intravenous Q12H  . sodium chloride  3 mL Intravenous Q12H  . sotalol  80 mg Oral Q12H  . spironolactone  25 mg Oral Daily  . sulfamethoxazole-trimethoprim  1 tablet Oral Daily  . thiamine  100 mg Oral Daily   . sodium chloride      ASSESSMENT AND PLAN: 1. Atrial Fibrillation with RVR  - History of atrial fibrillation 6+ years ago. Underwent successful TEE/DCCV then. Taken off Sotalol in 5/16 during an admit for sepsis and low BP. Found to be in recurrent AF with RVR on 08/16/2015 during his routine office visit.  - BP is soft and limits rate controlling agents.Currently on Dig 0.125 mg daily, Sotalol 80mg  BID, and Cardizem 30mg  TID. - Underwent successful TEE/DCCV on 08/18/2015.  - This patients CHA2DS2-VASc Score and unadjusted Ischemic Stroke Rate (% per year) is equal to 2.2 % stroke rate/year from a score of 2 (Vascular, Age). Started on Pradaxa for one month following his procedure. - Started on Sotalol 80mg  BID following the procedure. Monitoring on telemetry for at least 48 hours following initiation of the medication. QTc recorded up to 511 at times on telemetry. EKG this AM shows QTc of 494.   - In regards to his Cardizem, consider discontinuing or converting to Cardizem CD for better compliance in outpatient setting.  2. CAD - s/p PCI to the LAD in 2011 with a BMS.  - denies any recent anginal symptoms. - Plavix stopped to avoid triple therapy with Pradaxa and ASA.   3. Alcoholism  - has mild tremor. - on CIWA protocol while admitted.   4. HLD  - No longer on statin Rx secondary to Liver disease.  5. Prostate Cancer with Metastatic Disease to the Bone  - followed by Dr. [name] as outpatient - continue current medication regimen  6. Chronic Macrocytic Anemia - Hgb 11.5 on admission. 9.9 on 08/19/2015.  - chronic likely secondary to alcoholism.  - already on  Folate supplementation. Will check B-12 and Homocysteine levels.  Signed, Erma Heritage , PA-C 7:38 AM 08/19/2015 Agree with above.  Patient tolerating sotolol so far. Holding NSR. QTc normal. BP soft. Will stop diltiazem now that he is back in NSR. May ambulate in hall today.

## 2015-08-20 LAB — COMPREHENSIVE METABOLIC PANEL
ALBUMIN: 3 g/dL — AB (ref 3.5–5.0)
ALT: 22 U/L (ref 17–63)
AST: 64 U/L — AB (ref 15–41)
Alkaline Phosphatase: 145 U/L — ABNORMAL HIGH (ref 38–126)
Anion gap: 11 (ref 5–15)
BUN: 16 mg/dL (ref 6–20)
CHLORIDE: 104 mmol/L (ref 101–111)
CO2: 21 mmol/L — ABNORMAL LOW (ref 22–32)
Calcium: 8.3 mg/dL — ABNORMAL LOW (ref 8.9–10.3)
Creatinine, Ser: 1.33 mg/dL — ABNORMAL HIGH (ref 0.61–1.24)
GFR calc Af Amer: >60 mL/min (ref >60)
GFR, EST NON AFRICAN AMERICAN: 54 mL/min — AB (ref >60)
Glucose, Bld: 91 mg/dL (ref 65–99)
POTASSIUM: 4.2 mmol/L (ref 3.5–5.1)
Sodium: 136 mmol/L (ref 135–145)
Total Bilirubin: 1.9 mg/dL — ABNORMAL HIGH (ref 0.3–1.2)
Total Protein: 8 g/dL (ref 6.5–8.1)

## 2015-08-20 LAB — VITAMIN B12: VITAMIN B 12: 1461 pg/mL — AB (ref 180–914)

## 2015-08-20 LAB — CBC
HCT: 32.8 % — ABNORMAL LOW (ref 39.0–52.0)
Hemoglobin: 10.6 g/dL — ABNORMAL LOW (ref 13.0–17.0)
MCH: 34.6 pg — AB (ref 26.0–34.0)
MCHC: 32.3 g/dL (ref 30.0–36.0)
MCV: 107.2 fL — ABNORMAL HIGH (ref 78.0–100.0)
PLATELETS: 108 10*3/uL — AB (ref 150–400)
RBC: 3.06 MIL/uL — AB (ref 4.22–5.81)
RDW: 16.3 % — ABNORMAL HIGH (ref 11.5–15.5)
WBC: 5.7 10*3/uL (ref 4.0–10.5)

## 2015-08-20 NOTE — Progress Notes (Signed)
Patient Name: Allen Schroeder Date of Encounter: 08/20/2015     Principal Problem:   Atrial fibrillation with RVR ([place]) Active Problems:   CAD, NATIVE VESSEL   Prostate cancer, primary, with metastasis from prostate to other site [facility])    SUBJECTIVE  No chest pain. He is remaining in NSR. Lab work remains satisfactory. EKG this am pending. QTc on telemetry 490. No bleeding from Pradaxa. BP chronically soft.  CURRENT MEDS . aspirin EC  81 mg Oral Daily  . dabigatran  150 mg Oral BID  . digoxin  0.125 mg Oral Daily  . enzalutamide  120 mg Oral Daily  . FLORA-Q  1 capsule Oral Daily  . folic acid  1 mg Oral Daily  . furosemide  20 mg Oral Daily  . LORazepam  0-4 mg Oral Q12H  . multivitamin with minerals  1 tablet Oral Daily  . pneumococcal 23 valent vaccine  0.5 mL Intramuscular Tomorrow-1000  . rifaximin  550 mg Oral BID  . sodium chloride  3 mL Intravenous Q12H  . sodium chloride  3 mL Intravenous Q12H  . sotalol  80 mg Oral Q12H  . spironolactone  25 mg Oral Daily  . sulfamethoxazole-trimethoprim  1 tablet Oral Daily  . thiamine  100 mg Oral Daily    OBJECTIVE  Filed Vitals:   08/19/15 1316 08/19/15 2016 08/20/15 0403 08/20/15 0411  BP: 105/63 109/63 101/59   Pulse: 75 77 70   Temp: 98.8 F (37.1 C) 98.3 F (36.8 C) 99 F (37.2 C)   TempSrc: Oral Oral Oral   Resp: 18 18 20    Height:      Weight:    215 lb 2.7 oz (97.6 kg)  SpO2: 94% 99% 98%     Intake/Output Summary (Last 24 hours) at 08/20/15 1029 Last data filed at 08/20/15 0800  Gross per 24 hour  Intake    720 ml  Output      0 ml  Net    720 ml   Filed Weights   08/18/15 1231 08/19/15 0500 08/20/15 0411  Weight: 217 lb (98.431 kg) 217 lb 9.6 oz (98.703 kg) 215 lb 2.7 oz (97.6 kg)    PHYSICAL EXAM  General: Pleasant, NAD. Neuro: Alert and oriented X 3. Moves all extremities spontaneously. Psych: Normal affect. HEENT:  Normal  Neck: Supple without bruits or JVD. Lungs:  Resp regular and  unlabored, CTA. Heart: RRR no s3, s4, or murmurs. Abdomen: Soft, non-tender, non-distended, BS + x 4.  Extremities: No clubbing, cyanosis or edema. DP/PT/Radials 2+ and equal bilaterally.  Accessory Clinical Findings  CBC  Recent Labs  08/19/15 0340 08/20/15 0307  WBC 5.4 5.7  HGB 9.9* 10.6*  HCT 30.6* 32.8*  MCV 107.4* 107.2*  PLT 86* [id number]*   Basic Metabolic Panel  Recent Labs  08/18/15 0058 08/20/15 0307  NA 134* 136  K 3.9 4.2  CL 100* 104  CO2 25 21*  GLUCOSE 103* 91  BUN 12 16  CREATININE 1.09 1.33*  CALCIUM 8.2* 8.3*   Liver Function Tests  Recent Labs  08/20/15 0307  AST 64*  ALT 22  ALKPHOS 145*  BILITOT 1.9*  PROT 8.0  ALBUMIN 3.0*   No results for input(s): LIPASE, AMYLASE in the last 72 hours. Cardiac Enzymes No results for input(s): CKTOTAL, CKMB, CKMBINDEX, TROPONINI in the last 72 hours. BNP Invalid input(s): POCBNP D-Dimer No results for input(s): DDIMER in the last 72 hours. Hemoglobin A1C No results for input(s):  HGBA1C in the last 72 hours. Fasting Lipid Panel No results for input(s): CHOL, HDL, LDLCALC, TRIG, CHOLHDL, LDLDIRECT in the last 72 hours. Thyroid Function Tests No results for input(s): TSH, T4TOTAL, T3FREE, THYROIDAB in the last 72 hours.  Invalid input(s): FREET3  TELE  NSR  ECG  Pending today  Radiology/Studies  No results found.  ASSESSMENT AND PLAN  1. Atrial Fibrillation with RVR  - History of atrial fibrillation 6+ years ago. Underwent successful TEE/DCCV then. Taken off Sotalol in 5/16 during an admit for sepsis and low BP. Found to be in recurrent AF with RVR on 08/16/2015 during his routine office visit.  - BP is soft and limits rate controlling agents.Currently on Dig 0.125 mg daily and Sotalol 80mg  BID.,  - Underwent successful TEE/DCCV on 08/18/2015.  - This patients CHA2DS2-VASc Score and unadjusted Ischemic Stroke Rate (% per year) is equal to 2.2 % stroke rate/year from a score of 2  (Vascular, Age). Started on Pradaxa for one month following his procedure.   2. CAD - s/p PCI to the LAD in 2011 with a BMS.  - denies any recent anginal symptoms. - Plavix stopped to avoid triple therapy with Pradaxa and ASA.   3. Alcoholism  - has mild tremor. LFTs mildly elevated.  4. HLD  - No longer on statin Rx secondary to Liver disease.  5. Prostate Cancer with Metastatic Disease to the Bone  - followed by Dr. [name] as outpatient - continue current medication regimen  6. Chronic Macrocytic Anemia - Hgb 11.5 on admission. 9.9 on 08/19/2015. 10.6 on 08/20/15. - chronic likely secondary to alcoholism.  - already on Folate supplementation. Vit B12 level high.  Plan: Continue telemetry today while loading sotolol. So far no pro-arrhythmias. Home Saturday am if stable overnight. Pradaxa for one month, then just ASA alone.  Signed, Warren Danes MD

## 2015-08-21 MED ORDER — SOTALOL HCL 80 MG PO TABS: 80.0000 mg | ORAL_TABLET | Freq: Two times a day (BID) | ORAL | Status: DC

## 2015-08-21 MED ORDER — DIGOXIN 125 MCG PO TABS: 0.1250 mg | ORAL_TABLET | Freq: Every day | ORAL | Status: DC

## 2015-08-21 MED ORDER — DABIGATRAN ETEXILATE MESYLATE 150 MG PO CAPS: 150.0000 mg | ORAL_CAPSULE | Freq: Two times a day (BID) | ORAL | Status: DC

## 2015-08-21 MED ORDER — ASPIRIN 81 MG PO TBEC: 81.0000 mg | DELAYED_RELEASE_TABLET | Freq: Every day | ORAL | Status: DC

## 2015-08-21 NOTE — Progress Notes (Signed)
Primary Cardiologist: Sherren Mocha MD  Cardiology Specific Problem List: 1.Atrial fib with RVR-S/P DCCV 08/18/2015 2.CAD 3.Hyperlipidemia  Subjective:    Feels well. Anxious to go home. No chest pain, dizziness or dyspnea. Has been up in the hallway walking without difficulty.   Objective:   Temp:  [97.4 F (36.3 C)-99.5 F (37.5 C)] 99.5 F (37.5 C) (01/14 0325) Pulse Rate:  [63-73] 73 (01/14 0325) Resp:  [18-20] 18 (01/14 0325) BP: (103-118)/(60-73) 105/60 mmHg (01/14 0325) SpO2:  [96 %-100 %] 97 % (01/14 0325) Weight:  [216 lb 9.6 oz (98.249 kg)] 216 lb 9.6 oz (98.249 kg) (01/14 0325) Last BM Date: 08/19/15  Filed Weights   08/19/15 0500 08/20/15 0411 08/21/15 0325  Weight: 217 lb 9.6 oz (98.703 kg) 215 lb 2.7 oz (97.6 kg) 216 lb 9.6 oz (98.249 kg)    Intake/Output Summary (Last 24 hours) at 08/21/15 0730 Last data filed at 08/21/15 0100  Gross per 24 hour  Intake   1200 ml  Output      0 ml  Net   1200 ml    Telemetry: NSR in the 60's and 70's.   Exam:  General: No acute distress.  HEENT: Conjunctiva and lids normal, oropharynx clear.  Lungs: Clear to auscultation, nonlabored.  Cardiac: No elevated JVP or bruits. RRR, no gallop or rub.   Abdomen: Normoactive bowel sounds, nontender, nondistended.  Extremities: No pitting edema, distal pulses full.  Neuropsychiatric: Alert and oriented x3, affect appropriate.   Lab Results:  Basic Metabolic Panel:  Recent Labs Lab 08/16/15 1804 08/18/15 0058 08/20/15 0307  NA 134* 134* 136  K 3.5 3.9 4.2  CL 99* 100* 104  CO2 22 25 21*  GLUCOSE 110* 103* 91  BUN 9 12 16   CREATININE 1.07 1.09 1.33*  CALCIUM 8.8* 8.2* 8.3*  MG 2.1  --   --     Liver Function Tests:  Recent Labs Lab 08/16/15 1804 08/20/15 0307  AST 98* 64*  ALT 28 22  ALKPHOS 162* 145*  BILITOT 1.5* 1.9*  PROT 8.3* 8.0  ALBUMIN 3.2* 3.0*    CBC:  Recent Labs Lab 08/18/15 0058 08/19/15 0340 08/20/15 0307  WBC  10.5 5.4 5.7  HGB 11.1* 9.9* 10.6*  HCT 35.2* 30.6* 32.8*  MCV 103.2* 107.4* 107.2*  PLT 196 86* 108*    Coagulation:  Recent Labs Lab 08/16/15 1804  INR 1.41    ECG: NSR rate of 68 bpm. QT interval 440 ms   Medications:   Scheduled Medications: . aspirin EC  81 mg Oral Daily  . dabigatran  150 mg Oral BID  . digoxin  0.125 mg Oral Daily  . enzalutamide  120 mg Oral Daily  . FLORA-Q  1 capsule Oral Daily  . folic acid  1 mg Oral Daily  . furosemide  20 mg Oral Daily  . LORazepam  0-4 mg Oral Q12H  . multivitamin with minerals  1 tablet Oral Daily  . pneumococcal 23 valent vaccine  0.5 mL Intramuscular Tomorrow-1000  . rifaximin  550 mg Oral BID  . sodium chloride  3 mL Intravenous Q12H  . sodium chloride  3 mL Intravenous Q12H  . sotalol  80 mg Oral Q12H  . spironolactone  25 mg Oral Daily  . sulfamethoxazole-trimethoprim  1 tablet Oral Daily  . thiamine  100 mg Oral Daily    Infusions: . sodium chloride      PRN Medications: sodium chloride, acetaminophen, ondansetron (ZOFRAN) IV, oxyCODONE,  sodium chloride, sodium chloride, sodium chloride   Assessment and Plan:   1.Atrial fib with RVR: S/P DCCV on 1.11.2017. Remains in NSR after reinstitution of sotalol 80 mg BID, and digoxin 0.125 mg daily. He is without complaint. Continue Pradaxa-CHADS VASC Score of 2. To stay on this for one month post discharge. He wishes to follow up with Dr. Burt Knack, his primary cardiologist.   2. CAD: Hx of PCI to LAD (2011) [id number] doing well and asymptomatic. He is no longer on Plavix, but remains on ASA. May need to consider putting back on Plavix after Pradaxa discontinued if risk factors remain great.  3. ETOH Abuse: LFT's are still mildly elevated from labs on 1/13.2017. Cessation is recommended.   4. Chronic Microcytic Anemia: Most recent Hgb on 08/20/2015 was 10.6 and 32.8. Platelets improved to 108 from 86. But less than on admission at 196. He is on supplements. Will  need to follow with PCP.  Phill Myron. Lawrence NP [place]  08/21/2015, 7:30 AM   Patient seen and examined and history reviewed. Agree with above findings and plan. Feels very well. Maintaining NSR. QTc is OK at 467 msec. Will DC home today on sotalol and Pradaxa. Plavix discontinued. Will arrange follow up in office.  Kamri Gotsch [place], [place] 08/21/2015 9:30 AM

## 2015-08-21 NOTE — Discharge Summary (Signed)
Physician Discharge Summary  Patient ID: Allen Schroeder MRN: [id number] DOB/AGE: 1951/05/27 65 y.o.  Admit date: 08/16/2015 Discharge date: 08/21/2015  Primary Discharge Diagnosis  1. Atrial fib with RVR-S/P DCCV 08/18/2015 2. CAD  Secondary Discharge Diagnosis 1. ETOH abuse 2. Microcytic Anemia  Primary Cardiologist: Sherren Mocha MD  Significant Diagnostic Studies: 1. None   Hospital Course:      Allen Schroeder is a 8 y/omale patient with known history of CAD, status post prior PCI to the LAD 2011, paroxysmal atrial fibrillation, metastatic prostate cancer, and alcoholism with associated alcohol-related chronic liver disease, and prostate CA with mets.. The patient was seen on routine follow-up by Richardson Dopp, PA, for recurrent atrial fibrillation. EKG revealed A. fib with a heart rate 147 bpm, he was asymptomatic. The patient was admitted to the hospital for cardioversion. He apparently had been taken off of sotalol on 5 16 during an admission for sepsis and hypotension.     On 08/18/2015, the patient had a TEE cardioversion, by Dr. Cathie Olden.. This was successful. He was restarted on sotalol  80 mg twice a day. He was recommended that he be placed on Eliquis 5 mg twice a day. On follow-up rounding I, Dr. Mare Ferrari, a conversation was had with the patient's pharmacy, and he was placed on per DAX instead to minimize interaction with chemotherapy drugs.     The patient was monitored on telemetry for 48 hours post cardioversion. Daily EKGs were completed. QT interval was 440 ms on day of discharge. He was taken off of Plavix in the setting of triple anticoagulation therapy . On day of discharge, he was seen and examined by Dr. Martinique. He was without complaint and anxious to return home. Prescriptions were provided for Sotalol and Pradaxa. He was informed not to take Plavix. He will have close follow-up in our office and will need a follow-up CBC and EKG on that visit.    Discharge  Exam: Blood pressure 105/60, pulse 73, temperature 99.5 F (37.5 C), temperature source Oral, resp. rate 18, height 6\' 2"  (1.88 m), weight 216 lb 9.6 oz (98.249 kg), SpO2 97 %.   Labs:   Lab Results  Component Value Date   WBC 5.7 08/20/2015   HGB 10.6* 08/20/2015   HCT 32.8* 08/20/2015   MCV 107.2* 08/20/2015   PLT 108* 08/20/2015     Recent Labs Lab 08/20/15 0307  NA 136  K 4.2  CL 104  CO2 21*  BUN 16  CREATININE 1.33*  CALCIUM 8.3*  PROT 8.0  BILITOT 1.9*  ALKPHOS 145*  ALT 22  AST 64*  GLUCOSE 91   Lab Results  Component Value Date   CKTOTAL 233* 10/08/2010   CKMB 3.4 10/08/2010   TROPONINI 0.01        NO INDICATION OF MYOCARDIAL INJURY. 10/08/2010    Lab Results  Component Value Date   CHOL 156 11/04/2012   CHOL 200 05/11/2010   CHOL * 01/12/2009    318        ATP III CLASSIFICATION:  <200     mg/dL   Desirable  200-239  mg/dL   Borderline High  >=240    mg/dL   High          Lab Results  Component Value Date   HDL 54.60 11/04/2012   HDL 61.30 05/11/2010   HDL 57 01/12/2009   Lab Results  Component Value Date   LDLCALC 61 11/04/2012   [place] * 01/12/2009  198        Total Cholesterol/HDL:CHD Risk Coronary Heart Disease Risk Table                     Men   Women  1/2 Average Risk   3.4   3.3  Average Risk       5.0   4.4  2 X Average Risk   9.6   7.1  3 X Average Risk  23.4   11.0        Use the calculated Patient Ratio above and the CHD Risk Table to determine the patient's CHD Risk.        ATP III CLASSIFICATION (LDL):  <100     mg/dL   Optimal  100-129  mg/dL   Near or Above                    Optimal  130-159  mg/dL   Borderline  160-189  mg/dL   High  >190     mg/dL   Very High   Lab Results  Component Value Date   TRIG 200.0* 11/04/2012   TRIG 327.0* 05/11/2010   TRIG 313* 01/12/2009   Lab Results  Component Value Date   CHOLHDL 3 11/04/2012   CHOLHDL 3 05/11/2010   CHOLHDL 5.6 01/12/2009   Lab Results   Component Value Date   LDLDIRECT 95.6 05/11/2010      EKG: NSR rate of 68 bpm. QT intercal of 440 ms   FOLLOW UP PLANS AND APPOINTMENTS     Discharge Instructions    Diet - low sodium heart healthy    Complete by:  As directed      Increase activity slowly    Complete by:  As directed             Medication List    STOP taking these medications        clopidogrel 75 MG tablet  Commonly known as:  PLAVIX      TAKE these medications        aspirin 81 MG EC tablet  Take 1 tablet (81 mg total) by mouth daily.     CALCIUM + D3 PO  Take 1 tablet by mouth 2 (two) times daily.     dabigatran 150 MG Caps capsule  Commonly known as:  PRADAXA  Take 1 capsule (150 mg total) by mouth 2 (two) times daily.     digoxin 0.125 MG tablet  Commonly known as:  LANOXIN  Take 1 tablet (0.125 mg total) by mouth daily.     enzalutamide 40 MG capsule  Commonly known as:  XTANDI  Take 4 capsules (160 mg total) by mouth daily.     folic acid 1 MG tablet  Commonly known as:  FOLVITE  Take one tablet by mouth once a day....     furosemide 20 MG tablet  Commonly known as:  LASIX  Take 0.5 tablets (10 mg total) by mouth daily.     multivitamin with minerals Tabs tablet  Take 1 tablet by mouth daily with breakfast.     oxyCODONE 5 MG immediate release tablet  Commonly known as:  Oxy IR/ROXICODONE  Take 0.5-1 tablets (2.5-5 mg total) by mouth every 6 (six) hours as needed for moderate pain or severe pain.     oxymetazoline 0.05 % nasal spray  Commonly known as:  AFRIN  Place 1 spray into both nostrils at bedtime.     rifaximin  550 MG Tabs tablet  Commonly known as:  XIFAXAN  Take 1 tablet (550 mg total) by mouth 2 (two) times daily.     sotalol 80 MG tablet  Commonly known as:  BETAPACE  Take 1 tablet (80 mg total) by mouth 2 (two) times daily.     spironolactone 25 MG tablet  Commonly known as:  ALDACTONE  Take 1 tablet (25 mg total) by mouth daily.      sulfamethoxazole-trimethoprim 800-160 MG tablet  Commonly known as:  BACTRIM DS,SEPTRA DS  Take 1 tablet by mouth daily.     thiamine 100 MG tablet  Take 1 tablet (100 mg total) by mouth daily.       Follow-up Information    Follow up with Sherren Mocha, MD.   Specialty:  Cardiology   Why:  Our office will call you for appt for follow up with Dr. Burt Knack or PA.    Contact information:   [id number] [place] [address] [phone]         Time spent with patient to include physician time 30 minutes Signed: Phill Myron. Lawrence NP [place]  08/21/2015, 8:13 AM Co-Sign MD Patient seen and examined and history reviewed. Agree with above findings and plan. See earlier rounding note.  Airis Barbee Martinique, [place] 08/21/2015 9:32 AM

## 2015-08-23 ENCOUNTER — Telehealth: Payer: Self-pay | Admitting: Cardiovascular Disease

## 2015-08-23 ENCOUNTER — Telehealth: Payer: Self-pay | Admitting: Physician Assistant

## 2015-08-23 LAB — HOMOCYSTEINE: HOMOCYSTEINE-NORM: 11.9 umol/L (ref 0.0–15.0)

## 2015-08-23 NOTE — Telephone Encounter (Signed)
Samples of Pradaxa 150mg , 2 boxes provided. I will work on the PA today.

## 2015-08-23 NOTE — Telephone Encounter (Signed)
New problem   Pt has TCM appt w/scott 1.19.17 per staff message,

## 2015-08-23 NOTE — Telephone Encounter (Signed)
Pt was prescribed Pradaxa while in the hospital-was told the refill is pending , needing a pre-auth  Medicare/Bcbs   pls call with info to wife [phone] or [phone]

## 2015-08-23 NOTE — Telephone Encounter (Signed)
Spoke with both Mr and Mrs Desta. Pt was dc from hospital and was  to start pradaxa and stop plavix. Pt has all other meds but does not have pradaxa. We are starting a prior auth and samples have been placed at the desk. 2nd call was a message and let them know to pick up now and call back with further questions, she did instruct me to leave a message because she neded to get in the shower. Mr Vowles is aware of follow up date and time. Pt is feeling well and will call with further questions or concerns.

## 2015-08-25 NOTE — Progress Notes (Signed)
Cardiology Office Note:    Date:  08/26/2015   ID:  Allen Schroeder, DOB Mar 04, 1951, MRN [id number]  PCP:  [name], MD  Cardiologist:  Dr. Sherren Mocha   Electrophysiologist:  n/a  Chief Complaint  Patient presents with  . Hospitalization Follow-up    admit with AFib with RVR    History of Present Illness:    Allen Schroeder is a 65 y.o. male with a hx of CAD status post prior PCI of the LAD in 2011, PAF, metastatic prostate cancer, alcoholism with associated alcohol-related chronic liver disease. He is not a candidate for anticoagulation. He has previously maintained sinus rhythm on sotalol.   Admitted 5/16 with Escherichia coli sepsis. Of note, sotalol was stopped at that time secondary to hypotension.   He returned for routine FU on 08/16/15 and was admitted for AF with RVR. He underwent TEE-DCCV with restoration of NSR.  He was started back on Sotalol 80 mg bid.  He was also placed on Pradaxa for anticoagulation. Plavix was DC'd.  QTc remained stable. Plan is to remain on direct oral anticoagulant therapy for 1 month post DCCV and consider resuming Plavix after DC of Pradaxa.  He returns for FU.  He is doing well. He denies chest pain, dyspnea, syncope, edema.  He denies any bleeding issues.     Past Medical History  Diagnosis Date  . Atrial fibrillation ([place])   . Other and unspecified hyperlipidemia   . Unspecified essential hypertension   . Elevated prostate specific antigen (PSA)   . Personal history of colonic polyps   . Hypocalcemia   . Acute alcoholic hepatitis   . Essential and other specified forms of tremor   . Anxiety state, unspecified   . Family history of malignant neoplasm of gastrointestinal tract   . Alcohol abuse with physiological dependence ([place]) 02/13/2012  . 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 Alcoholic hepatitis with ascites 11/13/2014  . Bacteremia,  escherichia coli   . Coronary artery disease     s/p PCI to the LAD in 2011 with a BMS  . Dysrhythmia   . Pneumonia     childhood  . Depression   . Malignant neoplasm of prostate ([place]) 11/2007    Mets to Bone    Past Surgical History  Procedure Laterality Date  . Vasectomy    . Cholecystectomy    . Coronary stent placement  2011  . Colonoscopy w/ biopsies    . Cardiac catheterization    . Tee without cardioversion N/A 08/18/2015    Procedure: TRANSESOPHAGEAL ECHOCARDIOGRAM (TEE);  Surgeon: Thayer Headings, MD;  [facility] ;  Service: Cardiovascular;  Laterality: N/A;  . Cardioversion N/A 08/18/2015    Procedure: CARDIOVERSION;  Surgeon: Thayer Headings, MD;  Location[facility] ENDOSCOPY;  Service: Cardiovascular;  Laterality: N/A;    Current Medications: Outpatient Prescriptions Prior to Visit  Medication Sig Dispense Refill  . aspirin EC 81 MG EC tablet Take 1 tablet (81 mg total) by mouth daily.    . Calcium Carb-Cholecalciferol (CALCIUM + D3 PO) Take 1 tablet by mouth 2 (two) times daily.    . dabigatran (PRADAXA) 150 MG CAPS capsule Take 1 capsule (150 mg total) by mouth 2 (two) times daily. 60 capsule 1  . digoxin (LANOXIN) 0.125 MG tablet Take 1 tablet (0.125 mg total) by mouth daily. 30 tablet 6  . Multiple Vitamin (  MULTIVITAMIN WITH MINERALS) TABS tablet Take 1 tablet by mouth daily with breakfast.    . oxymetazoline (AFRIN) 0.05 % nasal spray Place 1 spray into both nostrils at bedtime.    . rifaximin (XIFAXAN) 550 MG TABS tablet Take 1 tablet (550 mg total) by mouth 2 (two) times daily. 60 tablet 0  . sotalol (BETAPACE) 80 MG tablet Take 1 tablet (80 mg total) by mouth 2 (two) times daily. 60 tablet 11  . enzalutamide (XTANDI) 40 MG capsule Take 4 capsules (160 mg total) by mouth daily. (Patient not taking: Reported on 08/26/2015) [id number] capsule 0  . folic acid (FOLVITE) 1 MG tablet Take one tablet by mouth once a day.... (Patient not taking: Reported on 08/26/2015) 30  tablet 6  . furosemide (LASIX) 20 MG tablet Take 0.5 tablets (10 mg total) by mouth daily. (Patient not taking: Reported on 08/26/2015) 30 tablet 1  . oxyCODONE (OXY IR/ROXICODONE) 5 MG immediate release tablet Take 0.5-1 tablets (2.5-5 mg total) by mouth every 6 (six) hours as needed for moderate pain or severe pain. (Patient not taking: Reported on 08/26/2015)    . spironolactone (ALDACTONE) 25 MG tablet Take 1 tablet (25 mg total) by mouth daily. (Patient not taking: Reported on 08/26/2015) 30 tablet 1  . sulfamethoxazole-trimethoprim (BACTRIM DS,SEPTRA DS) 800-160 MG per tablet Take 1 tablet by mouth daily. (Patient not taking: Reported on 08/26/2015)    . thiamine 100 MG tablet Take 1 tablet (100 mg total) by mouth daily. (Patient not taking: Reported on 08/26/2015) 30 tablet 0   No facility-administered medications prior to visit.     Allergies:   Review of patient's allergies indicates no known allergies.   Social History   Social History  . Marital Status: Married    Spouse Name: N/A  . Number of Children: 3  . Years of Education: N/A   Occupational History  . lawyer     VP of [employer] History Main Topics  . Smoking status: Former Smoker    Types: Cigarettes  . Smokeless tobacco: Never Used     Comment: stopped in college  . Alcohol Use: 1.2 - 1.8 oz/week    2-3 Shots of liquor per week     Comment: has consumed as much as a 1/2 gallon of vodka in a day, now drinks at least 3-5 drinks per day.   . Drug Use: No  . Sexual Activity: No   Other Topics Concern  . None   Social History Narrative   Patient is married. 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 He has two sons, one daughter and 5 grandchildren. Both sons work in the business. Daughter lives in [place]. Close knit family.    11/18/2014          Family History:  The patient's family history includes Aneurysm in his mother; Colon cancer in his father; Coronary artery disease in his father; Prostate cancer in his brother and father.   ROS:   Please see the history of present illness.    Review of Systems  Constitution: Positive for malaise/fatigue.  All other systems reviewed and are negative.   Physical Exam:    VS:  BP 100/60 mmHg  Pulse 68  Ht 6\' 2"  (1.88 m)  Wt 222 lb 6.4 oz (100.88 kg)  BMI 28.54  kg/m2   GEN: Well nourished, well developed, in no acute distress HEENT: normal Neck: no JVD, no masses Cardiac: Normal S1/S2, RRR; no murmurs, rubs, or gallops, no edema    Respiratory:  clear to auscultation bilaterally; no wheezing, rhonchi or rales GI: soft, nontender, nondistended  MS: no deformity or atrophy Skin: warm and dry, no rash Neuro:  no focal deficits  Psych: Alert and oriented x 3, normal affect  Wt Readings from Last 3 Encounters:  08/26/15 222 lb 6.4 oz (100.88 kg)  08/21/15 216 lb 9.6 oz (98.249 kg)  08/16/15 225 lb (102.059 kg)      Studies/Labs Reviewed:    EKG:  EKG is  ordered today.  The ekg ordered today demonstrates NSR, HR 67, QTc 467 ms  Recent Labs: 08/16/2015: Magnesium 2.1; TSH 3.376 08/20/2015: ALT 22; BUN 16; Creatinine, Ser 1.33*; Hemoglobin 10.6*; Platelets 108*; Potassium 4.2; Sodium 136   Recent Lipid Panel    Component Value Date/Time   CHOL 156 11/04/2012 0807   TRIG 200.0* 11/04/2012 0807   HDL 54.60 11/04/2012 0807   CHOLHDL 3 11/04/2012 0807   VLDL 40.0 11/04/2012 0807   LDLCALC 61 11/04/2012 0807   LDLDIRECT 95.6 05/11/2010 1021    Additional studies/ records that were reviewed today include:   TEE 08/18/15 EF 50-55%, mild MR, no LAA clot  Echo 12/25/14 EF 55-60%, MAC, mild LAE, L pleural eff  LHC 8/11 LAD prox ulcerated plaque 50-60%, small mid D2 90% LCx normal RCA mid to dist 40-50% EF 60%  PCI 8/11 4 x 12 mm Vision BMS to prox  LAD   ASSESSMENT:    1. PAF (paroxysmal atrial fibrillation) ([place])   2. Coronary artery disease involving native coronary artery of native heart without angina pectoris   3. Essential hypertension   4. Hyperlipidemia     PLAN:   In order of problems listed above:  1. PAF - He is maintaining NSR after recent TEE-DCCV.  He is back on Sotalol.  QTc is ok.  He will need to remain on anticoagulation for 1 month post DCCV.  He has been deemed high risk for long term anticoagulation in the past given his ETOH related liver disease.  He will stop Pradaxa 30 days post DCCV.  He will remain on ASA alone after DC Pradaxa.  Continue Digoxin, Sotalol.  Arrange Digoxin level.    2. CAD - s/p PCI to the LAD in 2011 with a BMS. No angina. He was on Plavix alone prior to admit. I will review with Dr. Sherren Mocha whether we should change him back to Plavix vs remaining on ASA alone.  He is not on statin Rx given hx of liver disease.   3. HTN - BP tends to run low.  4. HL - No longer on statin Rx.  5. AKI - Creatinine increased 1.09 >> 1.33 prior to DC.  Will repeat BMET as well.    Medication Adjustments/Labs and Tests Ordered: Current medicines are reviewed at length with the patient today.  Concerns regarding medicines are outlined above.  Medication changes, Labs and Tests ordered today are outlined in the Patient Instructions noted below. Patient Instructions  Medication Instructions:  1. MAKE SURE TO TAKE THE PRADAXA FOR A FULL 30 DAYS  2. AFTER THE 30 DAYS OF PRADAXA YOU WILL STOP AND REMAIN ON ASPIRIN  Labwork: 08/27/15 BMET, DIGOXIN LEVEL; YOU WILL NEED TO HOLD THE DIGOXIN UNTIL THE LAB WORK HAS BEEN DONE.  Testing/Procedures: NONE  Follow-Up: 12/03/15 @ 2:45 WITH DR. Burt Knack  Any Other Special Instructions Will Be Listed Below (If Applicable).  If you need a refill on your cardiac medications before your next appointment, please call your  pharmacy.     Signed, Richardson Dopp, PA-C  08/26/2015 3:35 PM    [facility] [place], [place], [place]  [address] Phone: [phone]; Fax: [phone]

## 2015-08-26 ENCOUNTER — Encounter: Payer: Self-pay | Admitting: Physician Assistant

## 2015-08-26 ENCOUNTER — Ambulatory Visit (INDEPENDENT_AMBULATORY_CARE_PROVIDER_SITE_OTHER): Payer: Medicare Other | Admitting: Physician Assistant

## 2015-08-26 VITALS — BP 100/60 | HR 68 | Ht 74.0 in | Wt 222.4 lb

## 2015-08-26 DIAGNOSIS — I10 Essential (primary) hypertension: Secondary | ICD-10-CM

## 2015-08-26 DIAGNOSIS — N179 Acute kidney failure, unspecified: Secondary | ICD-10-CM

## 2015-08-26 DIAGNOSIS — E785 Hyperlipidemia, unspecified: Secondary | ICD-10-CM

## 2015-08-26 DIAGNOSIS — I2510 Atherosclerotic heart disease of native coronary artery without angina pectoris: Secondary | ICD-10-CM | POA: Diagnosis not present

## 2015-08-26 DIAGNOSIS — I480 Paroxysmal atrial fibrillation: Secondary | ICD-10-CM

## 2015-08-26 NOTE — Patient Instructions (Addendum)
Medication Instructions:  1. MAKE SURE TO TAKE THE PRADAXA FOR A FULL 30 DAYS  2. AFTER THE 30 DAYS OF PRADAXA YOU WILL STOP AND REMAIN ON ASPIRIN  Labwork: 08/27/15 BMET, DIGOXIN LEVEL; YOU WILL NEED TO HOLD THE DIGOXIN UNTIL THE LAB WORK HAS BEEN DONE.  Testing/Procedures: NONE  Follow-Up: 12/03/15 @ 2:45 WITH DR. Burt Knack  Any Other Special Instructions Will Be Listed Below (If Applicable).  If you need a refill on your cardiac medications before your next appointment, please call your pharmacy.

## 2015-08-27 ENCOUNTER — Telehealth: Payer: Self-pay | Admitting: Physician Assistant

## 2015-08-27 ENCOUNTER — Other Ambulatory Visit (INDEPENDENT_AMBULATORY_CARE_PROVIDER_SITE_OTHER): Payer: Medicare Other | Admitting: *Deleted

## 2015-08-27 DIAGNOSIS — I480 Paroxysmal atrial fibrillation: Secondary | ICD-10-CM

## 2015-08-27 DIAGNOSIS — I4891 Unspecified atrial fibrillation: Secondary | ICD-10-CM | POA: Diagnosis not present

## 2015-08-27 NOTE — Telephone Encounter (Signed)
Pt has been notified per Brynda Rim. PA and Dr. Burt Knack to stay on Paradaxa a full 30 days then only continue on the ASA and that we will NOT resume the Plavix. Pt verbalized understanding to plan of care.

## 2015-08-27 NOTE — Addendum Note (Signed)
Addended by: Eulis Foster on: 08/27/2015 03:13 PM   Modules accepted: Orders

## 2015-08-27 NOTE — Telephone Encounter (Signed)
Please tell patient I reviewed with Dr. Sherren Mocha He should remain on Pradaxa for 30 days as we discussed. After 30 days, stop the Pradaxa. Stay on ASA 81 mg QD. Do not resume Plavix. Richardson Dopp, PA-C   08/27/2015 1:02 PM

## 2015-08-28 LAB — BASIC METABOLIC PANEL
BUN: 9 mg/dL (ref 7–25)
CO2: 26 mmol/L (ref 20–31)
Calcium: 7.9 mg/dL — ABNORMAL LOW (ref 8.6–10.3)
Chloride: 102 mmol/L (ref 98–110)
Creat: 0.86 mg/dL (ref 0.70–1.25)
Glucose, Bld: 92 mg/dL (ref 65–99)
POTASSIUM: 4 mmol/L (ref 3.5–5.3)
SODIUM: 136 mmol/L (ref 135–146)

## 2015-08-28 LAB — DIGOXIN LEVEL

## 2015-08-30 ENCOUNTER — Telehealth: Payer: Self-pay | Admitting: *Deleted

## 2015-08-30 NOTE — Telephone Encounter (Signed)
Pt has given permission to s/w wife who has been notified of lab results and recommendation to f/u w/PCP about low calcium. Wife also asked if results be mailed to them, as well as faxed to Urologist, GI, PCP, Oncologist.

## 2015-09-03 ENCOUNTER — Other Ambulatory Visit: Payer: Self-pay | Admitting: Oncology

## 2015-09-03 MED FILL — *XTANDI 40MG CAPSULE: 40 | 30 days supply | Qty: 120 | Fill #0

## 2015-09-06 ENCOUNTER — Telehealth: Payer: Self-pay

## 2015-09-06 NOTE — Telephone Encounter (Signed)
Prior auth for Pradaxa 150mg  sent to [facility]. 1 box of samples provided to patient.

## 2015-09-07 ENCOUNTER — Telehealth: Payer: Self-pay

## 2015-09-07 NOTE — Telephone Encounter (Signed)
Pradaxa approved through 08/06/2016. Left a message for patient with this info.

## 2015-09-18 ENCOUNTER — Other Ambulatory Visit: Payer: Self-pay | Admitting: Adult Health

## 2015-10-04 ENCOUNTER — Other Ambulatory Visit: Payer: Self-pay | Admitting: Oncology

## 2015-10-05 ENCOUNTER — Other Ambulatory Visit (HOSPITAL_BASED_OUTPATIENT_CLINIC_OR_DEPARTMENT_OTHER): Payer: Medicare Other

## 2015-10-05 DIAGNOSIS — C61 Malignant neoplasm of prostate: Secondary | ICD-10-CM | POA: Diagnosis not present

## 2015-10-05 LAB — COMPREHENSIVE METABOLIC PANEL
ALT: 20 U/L (ref 0–55)
ANION GAP: 8 meq/L (ref 3–11)
AST: 99 U/L — AB (ref 5–34)
Albumin: 3.3 g/dL — ABNORMAL LOW (ref 3.5–5.0)
Alkaline Phosphatase: 205 U/L — ABNORMAL HIGH (ref 40–150)
BILIRUBIN TOTAL: 1.45 mg/dL — AB (ref 0.20–1.20)
BUN: 7.3 mg/dL (ref 7.0–26.0)
CALCIUM: 8.6 mg/dL (ref 8.4–10.4)
CHLORIDE: 102 meq/L (ref 98–109)
CO2: 27 mEq/L (ref 22–29)
CREATININE: 0.9 mg/dL (ref 0.7–1.3)
EGFR: 86 mL/min/{1.73_m2} — AB (ref >90)
Glucose: 88 mg/dl (ref 70–140)
POTASSIUM: 3.6 meq/L (ref 3.5–5.1)
Sodium: 137 mEq/L (ref 136–145)
Total Protein: 8.9 g/dL — ABNORMAL HIGH (ref 6.4–8.3)

## 2015-10-05 LAB — CBC WITH DIFFERENTIAL/PLATELET
BASO%: 0.7 % (ref 0.0–2.0)
BASOS ABS: 0.1 10*3/uL (ref 0.0–0.1)
EOS ABS: 0.4 10*3/uL (ref 0.0–0.5)
EOS%: 5.2 % (ref 0.0–7.0)
HEMATOCRIT: 34.1 % — AB (ref 38.4–49.9)
HGB: 11.3 g/dL — ABNORMAL LOW (ref 13.0–17.1)
LYMPH#: 1.4 10*3/uL (ref 0.9–3.3)
LYMPH%: 21.1 % (ref 14.0–49.0)
MCH: 35.1 pg — AB (ref 27.2–33.4)
MCHC: 33.1 g/dL (ref 32.0–36.0)
MCV: 105.9 fL — AB (ref 79.3–98.0)
MONO#: 0.6 10*3/uL (ref 0.1–0.9)
MONO%: 9.2 % (ref 0.0–14.0)
NEUT#: 4.3 10*3/uL (ref 1.5–6.5)
NEUT%: 63.8 % (ref 39.0–75.0)
PLATELETS: 99 10*3/uL — AB (ref 140–400)
RBC: 3.22 10*6/uL — ABNORMAL LOW (ref 4.20–5.82)
RDW: 15.9 % — ABNORMAL HIGH (ref 11.0–14.6)
WBC: 6.8 10*3/uL (ref 4.0–10.3)

## 2015-10-05 MED FILL — *XTANDI 40MG CAPSULE: 40 | 30 days supply | Qty: 120 | Fill #0

## 2015-10-06 LAB — PSA: Prostate Specific Ag, Serum: <0.1 ng/mL (ref 0.0–4.0)

## 2015-10-06 LAB — PSA (PARALLEL TESTING): PSA: 0.03 ng/mL (ref <=4.00)

## 2015-10-07 ENCOUNTER — Telehealth: Payer: Self-pay | Admitting: Oncology

## 2015-10-07 ENCOUNTER — Ambulatory Visit (HOSPITAL_BASED_OUTPATIENT_CLINIC_OR_DEPARTMENT_OTHER): Payer: Medicare Other | Admitting: Oncology

## 2015-10-07 VITALS — BP 111/71 | HR 78 | Temp 98.1°F | Resp 18 | Ht 74.0 in | Wt 226.0 lb

## 2015-10-07 DIAGNOSIS — C61 Malignant neoplasm of prostate: Secondary | ICD-10-CM | POA: Diagnosis not present

## 2015-10-07 DIAGNOSIS — C7951 Secondary malignant neoplasm of bone: Secondary | ICD-10-CM | POA: Diagnosis not present

## 2015-10-07 NOTE — Progress Notes (Signed)
Hematology and Oncology Follow Up Visit  Allen Schroeder [id number] 28-Sep-1950 65 y.o. 10/07/2015 12:24 PM Allen Schroeder, MDPerini, [name], MD   Principle Diagnosis: 65 year old gentleman with castration resistant prostate cancer with metastatic disease to the bone. He was initially diagnosed in October of 2009 with bony metastasis.   Prior Therapy: He was treated with hormonal therapy with monthly Fermagon injections and monthly Zometa infusions. PSA fell from 72.9 in October 2009 to a nadir of 0.24 by June of 2012.  PSA began to rise again and was 4.09 on 02/06/2012. Bone scan at that point did not show any obvious new lesions. He was started on Xtandi and once again has had a very nice response since.   Current therapy: Xtandi 160 mg daily started September of 2013 with an excellent response PSA is close to 0. Has been on hold since 11/2014 due to liver disease.  This was resumed on 07/08/2015.  Interim History:  Allen Schroeder presents today for a followup visit. Since the last visit, he was hospitalized in January 2017 for atrial fibrillation and rapid ventricular response and received DC cardioversion on 08/18/2015. Since his discharge, he has been started ones Sotalol and Pradaxa. He is tolerating this medication well he did report some increased in his fatigue levels.  He continues on Xtandi and tolerated it without any new side effects. he does report some mild fatigue and occasional hot flashes but no other side effects. He continues to perform work-related duties without any decline. He has not reported any jaundice or encephalopathy. He does report some increased abdominal distention and slight ascites. He does have nausea but his appetite is excellent and have gained some weight.  He reports no headaches, blurry vision or other neurological symptoms. He has not reported any back pain shoulder pain or pathological fractures. Has not reported any lower extremity edema. He does not report any chest  pain, palpitation. He does not report any cough or hemoptysis. He does not report any nausea, vomiting, constipation or hematochezia. Has not reported any genitourinary complaints, petechiae or lymphadenopathy.  The rest of his review of systems unremarkable.   Medications: I have reviewed the patient's current medications.  Current Outpatient Prescriptions  Medication Sig Dispense Refill  . Calcium Carb-Cholecalciferol (CALCIUM + D3 PO) Take 1 tablet by mouth 2 (two) times daily.    . [facility]S ASPIRIN LOW DOSE 81 MG EC tablet TAKE 1 TABLET BY MOUTH EVERY DAY 30 tablet 10  . dabigatran (PRADAXA) 150 MG CAPS capsule Take 1 capsule (150 mg total) by mouth 2 (two) times daily. 60 capsule 1  . digoxin (LANOXIN) 0.125 MG tablet Take 1 tablet (0.125 mg total) by mouth daily. 30 tablet 6  . folic acid (FOLVITE) 1 MG tablet Take 1 mg by mouth daily.  11  . furosemide (LASIX) 20 MG tablet Take 20 mg by mouth daily.    . Multiple Vitamin (MULTIVITAMIN WITH MINERALS) TABS tablet Take 1 tablet by mouth daily with breakfast.    . oxyCODONE (OXY IR/ROXICODONE) 5 MG immediate release tablet Take 5 mg by mouth every 6 (six) hours as needed for severe pain.    [name] oxymetazoline (AFRIN) 0.05 % nasal spray Place 1 spray into both nostrils at bedtime.    . rifaximin (XIFAXAN) 550 MG TABS tablet Take 1 tablet (550 mg total) by mouth 2 (two) times daily. 60 tablet 0  . sotalol (BETAPACE) 80 MG tablet Take 1 tablet (80 mg total) by mouth 2 (two) times daily.  60 tablet 11  . spironolactone (ALDACTONE) 25 MG tablet Take 25 mg by mouth daily.    [name] sulfamethoxazole-trimethoprim (BACTRIM DS,SEPTRA DS) 800-160 MG tablet Take 1 tablet by mouth daily.    [name] thiamine 100 MG tablet Take 100 mg by mouth daily.    Allen Schroeder 40 MG capsule TAKE 4 CAPSULES BY MOUTH DAILY 120 capsule 2   No current facility-administered medications for this visit.     Allergies: No Known Allergies  Past Medical History, Surgical history, Social  history, and Family History were reviewed and updated.   Physical Exam: Blood pressure 111/71, pulse 78, temperature 98.1 F (36.7 C), temperature source Oral, resp. rate 18, height 6\' 2"  (1.88 m), weight 226 lb (102.513 kg), SpO2 100 %. ECOG: 1 General appearance: alert and cooperative  gentleman without distress. Head: Normocephalic, without obvious abnormality no oral thrush noted. Sclera  Anicteric. Neck: no adenopathy no thyromegaly. Lymph nodes: Cervical, supraclavicular, and axillary nodes normal. Heart:regular rate and rhythm, S1, S2 normal, no murmur, click, rub or gallop Lung:chest clear, no wheezing, rales, normal symmetric air entry Abdomin: soft, non-tender, without masses or organomegaly. Slight shifting dullness noted. EXT:no erythema, induration, or nodules Neurological examination showed no deficits.   Lab Results: Lab Results  Component Value Date   WBC 6.8 10/05/2015   HGB 11.3* 10/05/2015   HCT 34.1* 10/05/2015   MCV 105.9* 10/05/2015   PLT 99* 10/05/2015     Chemistry      Component Value Date/Time   NA 137 10/05/2015 1552   NA 136 08/27/2015 1513   K 3.6 10/05/2015 1552   K 4.0 08/27/2015 1513   CL 102 08/27/2015 1513   CL 100 12/10/2012 1540   CO2 27 10/05/2015 1552   CO2 26 08/27/2015 1513   BUN 7.3 10/05/2015 1552   BUN 9 08/27/2015 1513   CREATININE 0.9 10/05/2015 1552   CREATININE 0.86 08/27/2015 1513   CREATININE 1.33* 08/20/2015 0307      Component Value Date/Time   CALCIUM 8.6 10/05/2015 1552   CALCIUM 7.9* 08/27/2015 1513   ALKPHOS 205* 10/05/2015 1552   ALKPHOS 145* 08/20/2015 0307   AST 99* 10/05/2015 1552   AST 64* 08/20/2015 0307   ALT 20 10/05/2015 1552   ALT 22 08/20/2015 0307   BILITOT 1.45* 10/05/2015 1552   BILITOT 1.9* 08/20/2015 0307      Results for Allen Schroeder (MRN [id number]) as of 10/07/2015 12:03  Ref. Range 06/23/2015 14:33 08/03/2015 15:02 10/05/2015 15:52  PSA Latest Ref Range: <=4.00 ng/mL 0.09 0.08 0.03         Impression and Plan:  65 year old on with the following issues:  1. Castration resistant prostate cancer with metastatic disease to the bone. His continued on Xtandi with excellent response and a PSA of less than 0.01. I do not think his Allen Schroeder is contributing to his jaundice and his hepatic failure.   Allen Schroeder was resumed in December 2016 and have tolerated it well. His PSA remains under excellent control declining to 0.03. I discussed with him the possibility of reducing the dose to 120 mg daily if he develops worsening fatigue or nausea. At this time none of these symptoms warrant dose reduction and he is managing them properly.   2. Jaundice related to alcoholic liver cirrhosis: His bilirubin remains under reasonable control.  3. Androgen deprivation: He is currently on androgen depravation. I recommend this continues for the time being.   4. Atrial fibrillation: He is currently in normal sinus  rhythm and chronically anticoagulated with Pradaxa.  5. Followup: Will be in 5 weeks for a recheck of his PSA at that time.  Zola Button, MD 3/2/201712:24 PM

## 2015-10-07 NOTE — Telephone Encounter (Signed)
per pof to sch pt appt-gave pt copy of avs °

## 2015-11-05 ENCOUNTER — Other Ambulatory Visit (HOSPITAL_BASED_OUTPATIENT_CLINIC_OR_DEPARTMENT_OTHER): Payer: Medicare Other

## 2015-11-05 DIAGNOSIS — C61 Malignant neoplasm of prostate: Secondary | ICD-10-CM | POA: Diagnosis not present

## 2015-11-05 LAB — COMPREHENSIVE METABOLIC PANEL
ALT: 20 U/L (ref 0–55)
ANION GAP: 8 meq/L (ref 3–11)
AST: 102 U/L — ABNORMAL HIGH (ref 5–34)
Albumin: 2.9 g/dL — ABNORMAL LOW (ref 3.5–5.0)
Alkaline Phosphatase: 220 U/L — ABNORMAL HIGH (ref 40–150)
BILIRUBIN TOTAL: 1.71 mg/dL — AB (ref 0.20–1.20)
BUN: 6.5 mg/dL — ABNORMAL LOW (ref 7.0–26.0)
CO2: 23 meq/L (ref 22–29)
Calcium: 8 mg/dL — ABNORMAL LOW (ref 8.4–10.4)
Chloride: 102 mEq/L (ref 98–109)
Creatinine: 0.9 mg/dL (ref 0.7–1.3)
EGFR: 90 mL/min/{1.73_m2} — AB (ref >90)
GLUCOSE: 133 mg/dL (ref 70–140)
POTASSIUM: 3.6 meq/L (ref 3.5–5.1)
SODIUM: 134 meq/L — AB (ref 136–145)
Total Protein: 8.5 g/dL — ABNORMAL HIGH (ref 6.4–8.3)

## 2015-11-05 LAB — CBC WITH DIFFERENTIAL/PLATELET
BASO%: 1.2 % (ref 0.0–2.0)
BASOS ABS: 0.1 10*3/uL (ref 0.0–0.1)
EOS ABS: 0.3 10*3/uL (ref 0.0–0.5)
EOS%: 4.7 % (ref 0.0–7.0)
HCT: 33.8 % — ABNORMAL LOW (ref 38.4–49.9)
HGB: 11.1 g/dL — ABNORMAL LOW (ref 13.0–17.1)
LYMPH%: 19.7 % (ref 14.0–49.0)
MCH: 34.7 pg — AB (ref 27.2–33.4)
MCHC: 32.8 g/dL (ref 32.0–36.0)
MCV: 105.6 fL — AB (ref 79.3–98.0)
MONO#: 0.7 10*3/uL (ref 0.1–0.9)
MONO%: 8.9 % (ref 0.0–14.0)
NEUT%: 65.5 % (ref 39.0–75.0)
NEUTROS ABS: 4.8 10*3/uL (ref 1.5–6.5)
PLATELETS: 100 10*3/uL — AB (ref 140–400)
RBC: 3.2 10*6/uL — AB (ref 4.20–5.82)
RDW: 16.6 % — ABNORMAL HIGH (ref 11.0–14.6)
WBC: 7.3 10*3/uL (ref 4.0–10.3)
lymph#: 1.4 10*3/uL (ref 0.9–3.3)

## 2015-11-06 LAB — PSA (PARALLEL TESTING): PSA: 0.02 ng/mL (ref <=4.00)

## 2015-11-06 LAB — PSA: Prostate Specific Ag, Serum: <0.1 ng/mL (ref 0.0–4.0)

## 2015-11-09 ENCOUNTER — Ambulatory Visit (HOSPITAL_BASED_OUTPATIENT_CLINIC_OR_DEPARTMENT_OTHER): Payer: Medicare Other | Admitting: Oncology

## 2015-11-09 ENCOUNTER — Telehealth: Payer: Self-pay | Admitting: Oncology

## 2015-11-09 VITALS — BP 126/77 | HR 76 | Temp 99.1°F | Resp 18 | Ht 74.0 in | Wt 225.2 lb

## 2015-11-09 DIAGNOSIS — C7951 Secondary malignant neoplasm of bone: Secondary | ICD-10-CM

## 2015-11-09 DIAGNOSIS — R17 Unspecified jaundice: Secondary | ICD-10-CM | POA: Diagnosis not present

## 2015-11-09 DIAGNOSIS — C61 Malignant neoplasm of prostate: Secondary | ICD-10-CM

## 2015-11-09 NOTE — Telephone Encounter (Signed)
Gave and printed appt shced and avs for pt for JUNE °

## 2015-11-09 NOTE — Progress Notes (Signed)
Hematology and Oncology Follow Up Visit  NIV LUISI [id number] November 05, 1950 65 y.o. 11/09/2015 1:59 PM [name], MDPerini, [name], MD   Principle Diagnosis: 65 year old gentleman with castration resistant prostate cancer with metastatic disease to the bone. He was initially diagnosed in October of 2009 with bony metastasis.   Prior Therapy: He was treated with hormonal therapy with monthly Fermagon injections and monthly Zometa infusions. PSA fell from 72.9 in October 2009 to a nadir of 0.24 by June of 2012.  PSA began to rise again and was 4.09 on 02/06/2012. Bone scan at that point did not show any obvious new lesions. He was started on Xtandi and once again has had a very nice response since.   Current therapy: Xtandi 160 mg daily started September of 2013 with an excellent response PSA is close to 0. Has been on hold since 11/2014 due to liver disease.  This was resumed on 07/08/2015.  Interim History:  Mr. Hartter presents today for a followup visit. Since the last visit, he reports no major changes in his health. He continues on Xtandi and tolerated it without any new side effects. he does report some mild fatigue and occasional hot flashes but these have not changed dramatically. He continues to perform work-related duties without any decline. He has not reported any jaundice or encephalopathy. He does report some increased abdominal distention and slight ascites. Appetite appeared a reasonable without any major changes.  He reports no headaches, blurry vision or other neurological symptoms. He has not reported any back pain shoulder pain or pathological fractures. Has not reported any lower extremity edema. He does not report any chest pain, palpitation. He does not report any cough or hemoptysis. He does not report any nausea, vomiting, constipation or hematochezia. Has not reported any genitourinary complaints, petechiae or lymphadenopathy.  The rest of his review of systems  unremarkable.   Medications: I have reviewed the patient's current medications.  Current Outpatient Prescriptions  Medication Sig Dispense Refill  . Calcium Carb-Cholecalciferol (CALCIUM + D3 PO) Take 1 tablet by mouth 2 (two) times daily.    . CVS ASPIRIN LOW DOSE 81 MG EC tablet TAKE 1 TABLET BY MOUTH EVERY DAY 30 tablet 10  . dabigatran (PRADAXA) 150 MG CAPS capsule Take 1 capsule (150 mg total) by mouth 2 (two) times daily. 60 capsule 1  . digoxin (LANOXIN) 0.125 MG tablet Take 1 tablet (0.125 mg total) by mouth daily. 30 tablet 6  . folic acid (FOLVITE) 1 MG tablet Take 1 mg by mouth daily.  11  . furosemide (LASIX) 20 MG tablet Take 20 mg by mouth daily.    . Multiple Vitamin (MULTIVITAMIN WITH MINERALS) TABS tablet Take 1 tablet by mouth daily with breakfast.    . oxyCODONE (OXY IR/ROXICODONE) 5 MG immediate release tablet Take 5 mg by mouth every 6 (six) hours as needed for severe pain.    [name] oxymetazoline (AFRIN) 0.05 % nasal spray Place 1 spray into both nostrils at bedtime.    . rifaximin (XIFAXAN) 550 MG TABS tablet Take 1 tablet (550 mg total) by mouth 2 (two) times daily. 60 tablet 0  . sotalol (BETAPACE) 80 MG tablet Take 1 tablet (80 mg total) by mouth 2 (two) times daily. 60 tablet 11  . spironolactone (ALDACTONE) 25 MG tablet Take 25 mg by mouth daily.    [name] sulfamethoxazole-trimethoprim (BACTRIM DS,SEPTRA DS) 800-160 MG tablet Take 1 tablet by mouth daily.    [name] thiamine 100 MG tablet Take 100  mg by mouth daily.    Gillermina Phy 40 MG capsule TAKE 4 CAPSULES BY MOUTH DAILY 120 capsule 2   No current facility-administered medications for this visit.     Allergies: No Known Allergies  Past Medical History, Surgical history, Social history, and Family History were reviewed and updated.   Physical Exam: Blood pressure 126/77, pulse 76, temperature 99.1 F (37.3 C), temperature source Oral, resp. rate 18, height 6\' 2"  (1.88 m), weight 225 lb 3.2 oz (102.15 kg), SpO2 99  %. ECOG: 1 General appearance: alert and cooperative appeared comfortable. Head: Normocephalic, without obvious abnormality no oral ulcers or lesions. Sclera  Anicteric. Neck: no adenopathy no thyromegaly. Lymph nodes: Cervical, supraclavicular, and axillary nodes normal. Heart:regular rate and rhythm, S1, S2 normal, no murmur, click, rub or gallop Lung:chest clear, no wheezing, rales, normal symmetric air entry Abdomin: soft, non-tender, without masses or organomegaly. Slight ascites noted. EXT:no erythema, induration, or nodules Neurological examination showed no deficits.   Lab Results: Lab Results  Component Value Date   WBC 7.3 11/05/2015   HGB 11.1* 11/05/2015   HCT 33.8* 11/05/2015   MCV 105.6* 11/05/2015   PLT 100* 11/05/2015     Chemistry      Component Value Date/Time   NA 134* 11/05/2015 1316   NA 136 08/27/2015 1513   K 3.6 11/05/2015 1316   K 4.0 08/27/2015 1513   CL 102 08/27/2015 1513   CL 100 12/10/2012 1540   CO2 23 11/05/2015 1316   CO2 26 08/27/2015 1513   BUN 6.5* 11/05/2015 1316   BUN 9 08/27/2015 1513   CREATININE 0.9 11/05/2015 1316   CREATININE 0.86 08/27/2015 1513   CREATININE 1.33* 08/20/2015 0307      Component Value Date/Time   CALCIUM 8.0* 11/05/2015 1316   CALCIUM 7.9* 08/27/2015 1513   ALKPHOS 220* 11/05/2015 1316   ALKPHOS 145* 08/20/2015 0307   AST 102* 11/05/2015 1316   AST 64* 08/20/2015 0307   ALT 20 11/05/2015 1316   ALT 22 08/20/2015 0307   BILITOT 1.71* 11/05/2015 1316   BILITOT 1.9* 08/20/2015 0307       Results for REYAANSH, RO (MRN [id number]) as of 11/09/2015 14:02  Ref. Range 10/05/2015 15:52 11/05/2015 13:16 11/05/2015 13:16  PSA Latest Ref Range: 0.0-4.0 ng/mL <0.1 0.02 <0.1        Impression and Plan:  64 year old on with the following issues:  1. Castration resistant prostate cancer with metastatic disease to the bone. His continued on Xtandi with excellent response and a PSA of less than 0.01. I do not  think his Gillermina Phy is contributing to his jaundice and his hepatic failure.   Gillermina Phy was resumed in December 2016 and have tolerated it well. His PSA remains very low without any complications related to this medication. The plan is to continue with the same dose and schedule and monitor his PSA and liver function tests periodically.    2. Jaundice related to alcoholic liver cirrhosis: His bilirubin remains under reasonable control.  3. Androgen deprivation: He is currently on androgen depravation. I recommend this continues for the time being.   4. Atrial fibrillation: He is currently in normal sinus rhythm and chronically anticoagulated with Pradaxa. no bleeding complications noted.   5. Followup: Will be in 8 weeks for a recheck of his PSA at that time.  Zola Button, MD 4/4/20171:59 PM

## 2015-11-18 DIAGNOSIS — M25572 Pain in left ankle and joints of left foot: Secondary | ICD-10-CM | POA: Diagnosis not present

## 2015-11-26 ENCOUNTER — Other Ambulatory Visit (HOSPITAL_COMMUNITY): Payer: Self-pay | Admitting: Sports Medicine

## 2015-11-26 ENCOUNTER — Ambulatory Visit (HOSPITAL_COMMUNITY)
Admission: RE | Admit: 2015-11-26 | Discharge: 2015-11-26 | Disposition: A | Discharge status: Home / Self Care | Payer: Medicare Other | Source: Ambulatory Visit | Attending: Internal Medicine | Admitting: Internal Medicine

## 2015-11-26 DIAGNOSIS — M79602 Pain in left arm: Secondary | ICD-10-CM | POA: Diagnosis not present

## 2015-11-26 DIAGNOSIS — M7989 Other specified soft tissue disorders: Principal | ICD-10-CM

## 2015-11-26 DIAGNOSIS — M79605 Pain in left leg: Secondary | ICD-10-CM

## 2015-11-26 NOTE — Progress Notes (Signed)
VASCULAR LAB PRELIMINARY  PRELIMINARY  PRELIMINARY  PRELIMINARY    Left lower extremity venous duplex completed.    Preliminary report:  Left:  No evidence of DVT, superficial thrombosis, or Baker's cyst.  Lorrin Bodner, RVS 11/26/2015, 5:23 PM

## 2015-11-30 DIAGNOSIS — L723 Sebaceous cyst: Secondary | ICD-10-CM | POA: Diagnosis not present

## 2015-11-30 DIAGNOSIS — D235 Other benign neoplasm of skin of trunk: Secondary | ICD-10-CM | POA: Diagnosis not present

## 2015-11-30 DIAGNOSIS — R17 Unspecified jaundice: Secondary | ICD-10-CM | POA: Diagnosis not present

## 2015-12-01 DIAGNOSIS — M7989 Other specified soft tissue disorders: Secondary | ICD-10-CM | POA: Diagnosis not present

## 2015-12-03 ENCOUNTER — Ambulatory Visit (INDEPENDENT_AMBULATORY_CARE_PROVIDER_SITE_OTHER): Payer: Medicare Other | Admitting: Cardiovascular Disease

## 2015-12-03 ENCOUNTER — Encounter: Payer: Self-pay | Admitting: Cardiovascular Disease

## 2015-12-03 VITALS — BP 110/58 | HR 72 | Ht 74.0 in | Wt 228.4 lb

## 2015-12-03 DIAGNOSIS — I2510 Atherosclerotic heart disease of native coronary artery without angina pectoris: Secondary | ICD-10-CM

## 2015-12-03 DIAGNOSIS — I480 Paroxysmal atrial fibrillation: Secondary | ICD-10-CM

## 2015-12-03 NOTE — Patient Instructions (Signed)
Medication Instructions:  Your physician has recommended you make the following change in your medication:  1. STOP Pradaxa 2. STOP Digoxin  Labwork: No new orders.   Testing/Procedures: No new orders.   Follow-Up: Your physician wants you to follow-up in: 6 MONTHS with Richardson Dopp PA-C.  You will receive a reminder letter in the mail two months in advance. If you don't receive a letter, please call our office to schedule the follow-up appointment.  Your physician wants you to follow-up in: 1 YEAR with Dr Burt Knack.  You will receive a reminder letter in the mail two months in advance. If you don't receive a letter, please call our office to schedule the follow-up appointment.   Any Other Special Instructions Will Be Listed Below (If Applicable).     If you need a refill on your cardiac medications before your next appointment, please call your pharmacy.

## 2015-12-03 NOTE — Progress Notes (Signed)
Cardiology Office Note Date:  12/03/2015   ID:  Allen Schroeder, DOB [birth year], MRN [id number]  PCP:  Allen Ly, MD  Cardiologist:  Allen Mocha, MD    Chief Complaint  Patient presents with  . Coronary Artery Disease    no cp,sob,lee,claudication  . Atrial Fibrillation    with RVR     History of Present Illness: Allen Schroeder is a 65 y.o. male who presents for follow-up evaluation. He's been followed for CAD and paroxysmal atrial fibrillation. Other medical problems include metastatic prostate CA and alcoholism with chronic liver disease. He was seen in January of this year and noted to have atrial fibrillation with RVR. He was hospitalized and treated with sotalol. He underwent TEE-guided cardioversion with recommendations for one month of anticoagulation with pradaxa.   He presents today for follow-up and is doing well from a cardiac perspective. He denies chest pain, shortness of breath, orthopnea, PND, or heart palpitations.    Past Medical History  Diagnosis Date  . Atrial fibrillation ([place])   . Other and unspecified hyperlipidemia   . Unspecified essential hypertension   . Elevated prostate specific antigen (PSA)   . Personal history of colonic polyps   . Hypocalcemia   . Acute alcoholic hepatitis   . Essential and other specified forms of tremor   . Anxiety state, unspecified   . Family history of malignant neoplasm of gastrointestinal tract   . Alcohol abuse with physiological dependence ([place]) 02/13/2012  . 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 Alcoholic hepatitis with ascites 11/13/2014  . Bacteremia, escherichia coli   . Coronary artery disease     s/p PCI to the LAD in 2011 with a BMS  . Dysrhythmia   . Pneumonia     childhood  . Depression   . Malignant neoplasm of prostate ([place]) 11/2007    Mets to Bone    Past Surgical History  Procedure Laterality Date    . Vasectomy    . Cholecystectomy    . Coronary stent placement  2011  . Colonoscopy w/ biopsies    . Cardiac catheterization    . Tee without cardioversion N/A 08/18/2015    Procedure: TRANSESOPHAGEAL ECHOCARDIOGRAM (TEE);  Surgeon: Thayer Headings, MD;  Location: [facility];  Service: Cardiovascular;  Laterality: N/A;  . Cardioversion N/A 08/18/2015    Procedure: CARDIOVERSION;  Surgeon: Thayer Headings, MD;  Location: [facility] ENDOSCOPY;  Service: Cardiovascular;  Laterality: N/A;    Current Outpatient Prescriptions  Medication Sig Dispense Refill  . Calcium Carb-Cholecalciferol (CALCIUM + D3 PO) Take 1 tablet by mouth 2 (two) times daily.    . CVS ASPIRIN LOW DOSE 81 MG EC tablet TAKE 1 TABLET BY MOUTH EVERY DAY 30 tablet 10  . dabigatran (PRADAXA) 150 MG CAPS capsule Take 1 capsule (150 mg total) by mouth 2 (two) times daily. 60 capsule 1  . digoxin (LANOXIN) 0.125 MG tablet Take 1 tablet (0.125 mg total) by mouth daily. 30 tablet 6  . folic acid (FOLVITE) 1 MG tablet Take 1 mg by mouth daily.  11  . furosemide (LASIX) 20 MG tablet Take 20 mg by mouth daily.    . Multiple Vitamin (MULTIVITAMIN WITH MINERALS) TABS tablet Take 1 tablet by mouth daily with breakfast.    . oxyCODONE (OXY IR/ROXICODONE) 5 MG immediate release tablet Take 5 mg by mouth every 6 (six) hours as needed  for severe pain.    [name] oxymetazoline (AFRIN) 0.05 % nasal spray Place 1 spray into both nostrils at bedtime.    . rifaximin (XIFAXAN) 550 MG TABS tablet Take 1 tablet (550 mg total) by mouth 2 (two) times daily. 60 tablet 0  . sotalol (BETAPACE) 80 MG tablet Take 1 tablet (80 mg total) by mouth 2 (two) times daily. 60 tablet 11  . spironolactone (ALDACTONE) 25 MG tablet Take 25 mg by mouth daily.    [name] sulfamethoxazole-trimethoprim (BACTRIM DS,SEPTRA DS) 800-160 MG tablet Take 1 tablet by mouth daily.    [name] thiamine 100 MG tablet Take 100 mg by mouth daily.    Gillermina Phy 40 MG capsule TAKE 4 CAPSULES BY MOUTH DAILY 120  capsule 2   No current facility-administered medications for this visit.    Allergies:   Review of patient's allergies indicates no known allergies.   Social History:  The patient  reports that he has quit smoking. His smoking use included Cigarettes. He has never used smokeless tobacco. He reports that he drinks about 1.2 - 1.8 oz of alcohol per week. He reports that he does not use illicit drugs.   Family History:  The patient's  family history includes Aneurysm in his mother; Colon cancer in his father; Coronary artery disease in his father; Prostate cancer in his brother and father.    ROS:  Please see the history of present illness.  Otherwise, review of systems is positive for leg swelling, diarrhea, and anxiety.  All other systems are reviewed and negative.    PHYSICAL EXAM: VS:  BP 110/58 mmHg  Pulse 72  Ht 6\' 2"  (1.88 m)  Wt 228 lb 6.4 oz (103.602 kg)  BMI 29.31 kg/m2 , BMI Body mass index is 29.31 kg/(m^2). GEN: Well nourished, well developed, in no acute distress HEENT: normal Neck: no JVD, no masses. No carotid bruits Cardiac: RRR without murmur or gallop                Respiratory:  clear to auscultation bilaterally, normal work of breathing GI: soft, nontender, nondistended, + BS MS: no deformity or atrophy Ext: 1+ pedal edema, pedal pulses 2+= bilaterally Skin: warm and dry, no rash Neuro:  Strength and sensation are intact Psych: euthymic mood, full affect  EKG:  EKG is not ordered today.  Recent Labs: 08/16/2015: Magnesium 2.1; TSH 3.376 11/05/2015: ALT 20; BUN 6.5*; Creatinine 0.9; HGB 11.1*; Platelets 100*; Potassium 3.6; Sodium 134*   Lipid Panel     Component Value Date/Time   CHOL 156 11/04/2012 0807   TRIG 200.0* 11/04/2012 0807   HDL 54.60 11/04/2012 0807   CHOLHDL 3 11/04/2012 0807   VLDL 40.0 11/04/2012 0807   LDLCALC 61 11/04/2012 0807   LDLDIRECT 95.6 05/11/2010 1021      Wt Readings from Last 3 Encounters:  12/03/15 228 lb 6.4 oz (103.602  kg)  11/09/15 225 lb 3.2 oz (102.15 kg)  10/07/15 226 lb (102.513 kg)     Cardiac Studies Reviewed: TEE 1.11.2017: Study Conclusions  - Left ventricle: Systolic function was normal. The estimated  ejection fraction was in the range of 50% to 55%.  - Aortic valve: No evidence of vegetation. No evidence of  vegetation.  - Mitral valve: No evidence of vegetation. There was mild  regurgitation.  - Left atrium: No evidence of thrombus in the atrial cavity or  appendage.  - Tricuspid valve: No evidence of vegetation.  Impressions:  - This was done for  further evaluation prior to TEE /  CArdioversion.  The cardioversion was successful  ASSESSMENT AND PLAN: 1.  Paroxysmal atrial fibrillation: CHADS-Vasc = 2 (age 89, CAD 1). Not a good candidate for long-term anticoagulation with alcohol-related chronic liver disease. Continue sotalol for rhythm control. STOP pradaxa and digoxin.  2. CAD, native vessel: stable without angina. ASA 81 mg recommended.  Current medicines are reviewed with the patient today.  The patient does not have concerns regarding medicines.  Labs/ tests ordered today include:  No orders of the defined types were placed in this encounter.    Disposition:   FU 6 months with Richardson Dopp, PA-C.   Deatra James, MD  12/03/2015 3:19 PM    [address], [place], [place]  [address] Phone: [phone]; Fax: [phone]

## 2015-12-10 MED FILL — *XTANDI 40MG CAPSULE: 40 | 30 days supply | Qty: 120 | Fill #1

## 2015-12-14 DIAGNOSIS — M25572 Pain in left ankle and joints of left foot: Secondary | ICD-10-CM | POA: Diagnosis not present

## 2015-12-14 DIAGNOSIS — M7989 Other specified soft tissue disorders: Secondary | ICD-10-CM | POA: Diagnosis not present

## 2016-01-05 ENCOUNTER — Encounter: Payer: Self-pay | Admitting: *Deleted

## 2016-01-06 ENCOUNTER — Encounter: Payer: Self-pay | Admitting: *Deleted

## 2016-01-07 ENCOUNTER — Encounter: Payer: Self-pay | Admitting: Internal Medicine

## 2016-01-07 NOTE — Progress Notes (Signed)
Patient ID: Allen Schroeder, male   DOB: 1950/10/05, 65 y.o.   MRN: [id number] Faxed the [facility] admission form to [phone] attention Glynn Octave along with the office notes, med list and problem list per Dr Celesta Aver request.  Form sent to be scanned in.

## 2016-01-18 ENCOUNTER — Telehealth: Payer: Self-pay | Admitting: Oncology

## 2016-01-18 ENCOUNTER — Ambulatory Visit (HOSPITAL_BASED_OUTPATIENT_CLINIC_OR_DEPARTMENT_OTHER): Payer: Medicare Other | Admitting: Oncology

## 2016-01-18 ENCOUNTER — Other Ambulatory Visit (HOSPITAL_BASED_OUTPATIENT_CLINIC_OR_DEPARTMENT_OTHER): Payer: Medicare Other

## 2016-01-18 VITALS — BP 118/83 | HR 86 | Temp 98.8°F | Resp 18 | Ht 74.0 in | Wt 231.3 lb

## 2016-01-18 DIAGNOSIS — C7951 Secondary malignant neoplasm of bone: Secondary | ICD-10-CM | POA: Diagnosis not present

## 2016-01-18 DIAGNOSIS — R17 Unspecified jaundice: Secondary | ICD-10-CM | POA: Diagnosis not present

## 2016-01-18 DIAGNOSIS — K7011 Alcoholic hepatitis with ascites: Secondary | ICD-10-CM

## 2016-01-18 DIAGNOSIS — K7290 Hepatic failure, unspecified without coma: Secondary | ICD-10-CM

## 2016-01-18 DIAGNOSIS — C61 Malignant neoplasm of prostate: Secondary | ICD-10-CM | POA: Diagnosis not present

## 2016-01-18 LAB — CBC WITH DIFFERENTIAL/PLATELET
BASO%: 1.3 % (ref 0.0–2.0)
BASOS ABS: 0.1 10*3/uL (ref 0.0–0.1)
EOS ABS: 0.2 10*3/uL (ref 0.0–0.5)
EOS%: 2.2 % (ref 0.0–7.0)
HCT: 27.9 % — ABNORMAL LOW (ref 38.4–49.9)
HEMOGLOBIN: 9.6 g/dL — AB (ref 13.0–17.1)
LYMPH%: 11.7 % — AB (ref 14.0–49.0)
MCH: 38.6 pg — AB (ref 27.2–33.4)
MCHC: 34.4 g/dL (ref 32.0–36.0)
MCV: 112 fL — AB (ref 79.3–98.0)
MONO#: 0.6 10*3/uL (ref 0.1–0.9)
MONO%: 7.8 % (ref 0.0–14.0)
NEUT%: 77 % — ABNORMAL HIGH (ref 39.0–75.0)
NEUTROS ABS: 5.9 10*3/uL (ref 1.5–6.5)
PLATELETS: 139 10*3/uL — AB (ref 140–400)
RBC: 2.49 10*6/uL — ABNORMAL LOW (ref 4.20–5.82)
RDW: 20.5 % — AB (ref 11.0–14.6)
WBC: 7.7 10*3/uL (ref 4.0–10.3)
lymph#: 0.9 10*3/uL (ref 0.9–3.3)

## 2016-01-18 LAB — COMPREHENSIVE METABOLIC PANEL
ALBUMIN: 2.3 g/dL — AB (ref 3.5–5.0)
ALT: 32 U/L (ref 0–55)
ANION GAP: 7 meq/L (ref 3–11)
AST: 166 U/L — ABNORMAL HIGH (ref 5–34)
Alkaline Phosphatase: 357 U/L — ABNORMAL HIGH (ref 40–150)
BILIRUBIN TOTAL: 11.61 mg/dL — AB (ref 0.20–1.20)
BUN: 7.3 mg/dL (ref 7.0–26.0)
CO2: 25 mEq/L (ref 22–29)
CREATININE: 0.8 mg/dL (ref 0.7–1.3)
Calcium: 7.5 mg/dL — ABNORMAL LOW (ref 8.4–10.4)
Chloride: 92 mEq/L — ABNORMAL LOW (ref 98–109)
Glucose: 88 mg/dl (ref 70–140)
POTASSIUM: 3.8 meq/L (ref 3.5–5.1)
Sodium: 124 mEq/L — ABNORMAL LOW (ref 136–145)
TOTAL PROTEIN: 8.5 g/dL — AB (ref 6.4–8.3)

## 2016-01-18 NOTE — Telephone Encounter (Signed)
Gave and printed appt sched and avs for pt for July  °

## 2016-01-18 NOTE — Progress Notes (Signed)
Hematology and Oncology Follow Up Visit  Allen Schroeder [id number] 1950-09-07 65 y.o. 01/18/2016 3:39 PM [name], MDPerini, [name], MD   Principle Diagnosis: 65 year old gentleman with castration resistant prostate cancer with metastatic disease to the bone. He was initially diagnosed in October of 2009 with bony metastasis.   Prior Therapy: He was treated with hormonal therapy with monthly Fermagon injections and monthly Zometa infusions. PSA fell from 72.9 in October 2009 to a nadir of 0.24 by June of 2012.  PSA began to rise again and was 4.09 on 02/06/2012. Bone scan at that point did not show any obvious new lesions. He was started on Xtandi and once again has had a very nice response since.   Current therapy: Xtandi 160 mg daily started September of 2013 with an excellent response PSA is close to 0. Has been on hold since 11/2014 due to liver disease.  This was resumed on 07/08/2015.  Interim History:  Allen Schroeder presents today for a followup visit. Since the last visit, he reports some decline in his health. He has reported more fatigue, weakness and had a few falls. He is also more jaundiced and reporting abdominal distention and other satiety. He has not reported any encephalopathy. He reports no complications associated with Xtandi at this time. He denied any nausea or vomiting. Denied any worsening lower extremity edema.  He reports no headaches, blurry vision or other neurological symptoms. He has not reported any back pain shoulder pain or pathological fractures. He does not report any chest pain, palpitation. He does not report any cough or hemoptysis. He does not report any nausea, vomiting, constipation or hematochezia. Has not reported any genitourinary complaints, petechiae or lymphadenopathy.  The rest of his review of systems unremarkable.   Medications: I have reviewed the patient's current medications.  Current Outpatient Prescriptions  Medication Sig Dispense Refill  .  Calcium Carb-Cholecalciferol (CALCIUM + D3 PO) Take 1 tablet by mouth 2 (two) times daily.    . [facility] ASPIRIN LOW DOSE 81 MG EC tablet TAKE 1 TABLET BY MOUTH EVERY DAY 30 tablet 10  . folic acid (FOLVITE) 1 MG tablet Take 1 mg by mouth daily.  11  . furosemide (LASIX) 20 MG tablet Take 20 mg by mouth daily.    . Multiple Vitamin (MULTIVITAMIN WITH MINERALS) TABS tablet Take 1 tablet by mouth daily with breakfast.    . oxyCODONE (OXY IR/ROXICODONE) 5 MG immediate release tablet Take 5 mg by mouth every 6 (six) hours as needed for severe pain.    [name] oxymetazoline (AFRIN) 0.05 % nasal spray Place 1 spray into both nostrils at bedtime.    . rifaximin (XIFAXAN) 550 MG TABS tablet Take 1 tablet (550 mg total) by mouth 2 (two) times daily. 60 tablet 0  . sotalol (BETAPACE) 80 MG tablet Take 1 tablet (80 mg total) by mouth 2 (two) times daily. 60 tablet 11  . spironolactone (ALDACTONE) 25 MG tablet Take 25 mg by mouth daily.    [name] sulfamethoxazole-trimethoprim (BACTRIM DS,SEPTRA DS) 800-160 MG tablet Take 1 tablet by mouth daily.    [name] thiamine 100 MG tablet Take 100 mg by mouth daily.    Gillermina Phy 40 MG capsule TAKE 4 CAPSULES BY MOUTH DAILY 120 capsule 2   No current facility-administered medications for this visit.     Allergies: No Known Allergies  Past Medical History, Surgical history, Social history, and Family History were reviewed and updated.   Physical Exam: Blood pressure 118/83, pulse 86,  temperature 98.8 F (37.1 C), temperature source Oral, resp. rate 18, height 6\' 2"  (1.88 m), weight 231 lb 4.8 oz (104.917 kg), SpO2 97 %. ECOG: 1 General appearance: Chronically ill-appearing gentleman without distress.. Head: Normocephalic, without obvious abnormality no oral ulcers or lesions. Sclera  anicteric  Neck: no adenopathy no thyromegaly. Lymph nodes: Cervical, supraclavicular, and axillary nodes normal. Heart:regular rate and rhythm, S1, S2 normal, no murmur, click, rub or  gallop Lung:chest clear, no wheezing, rales, normal symmetric air entry Abdomin: soft, non-tender, without masses or organomegaly. Tense ascites noted. EXT:no erythema, induration, or nodules Neurological examination showed no deficits.   Lab Results: Lab Results  Component Value Date   WBC 7.7 01/18/2016   HGB 9.6* 01/18/2016   HCT 27.9* 01/18/2016   MCV 112.0* 01/18/2016   PLT 139* 01/18/2016     Chemistry      Component Value Date/Time   NA 134* 11/05/2015 1316   NA 136 08/27/2015 1513   K 3.6 11/05/2015 1316   K 4.0 08/27/2015 1513   CL 102 08/27/2015 1513   CL 100 12/10/2012 1540   CO2 23 11/05/2015 1316   CO2 26 08/27/2015 1513   BUN 6.5* 11/05/2015 1316   BUN 9 08/27/2015 1513   CREATININE 0.9 11/05/2015 1316   CREATININE 0.86 08/27/2015 1513   CREATININE 1.33* 08/20/2015 0307      Component Value Date/Time   CALCIUM 8.0* 11/05/2015 1316   CALCIUM 7.9* 08/27/2015 1513   ALKPHOS 220* 11/05/2015 1316   ALKPHOS 145* 08/20/2015 0307   AST 102* 11/05/2015 1316   AST 64* 08/20/2015 0307   ALT 20 11/05/2015 1316   ALT 22 08/20/2015 0307   BILITOT 1.71* 11/05/2015 1316   BILITOT 1.9* 08/20/2015 0307         Results for Allen Schroeder, Allen Schroeder (MRN [id number]) as of 01/18/2016 15:24  Ref. Range 11/05/2015 13:16 11/05/2015 13:16  PSA Latest Ref Range: 0.0-4.0 ng/mL 0.02 <0.1       Impression and Plan:  65 year old on with the following issues:  1. Castration resistant prostate cancer with metastatic disease to the bone. His continued on Xtandi with excellent response and a PSA of less than 0.01. I do not think his Gillermina Phy is contributing to his jaundice and his hepatic failure.   Gillermina Phy was resumed in December 2016 and have tolerated it well. His PSA remains very low without any complications related to this medication. He appears to have worsening liver disease with increased jaundice and worsening ascites. Although I do not think this is related to [facility], I have  recommended holding this medication until his other liver disease issues are resolved.   2. Jaundice related to alcoholic liver cirrhosis: His bilirubin is pending from today but certainly clinically appears to be worse. I have made a referral to Dr. Carlean Purl for an evaluation. His continuous alcohol consumption is the likely cause of his decompensated liver disease.  He will need a paracentesis for his tense ascites and I will defer management of his ascites to Dr. Carlean Purl.  3. Androgen deprivation: He is currently on androgen depravation. I recommend this continues for the time being.   4. Atrial fibrillation: He is currently in normal sinus rhythm and chronically anticoagulated with Pradaxa. no bleeding complications noted.   5. Followup: Will be in 6 weeks for a recheck of his PSA at that time.  [facility] MD 6/13/20173:39 PM

## 2016-01-19 ENCOUNTER — Telehealth: Payer: Self-pay | Admitting: *Deleted

## 2016-01-19 ENCOUNTER — Telehealth: Payer: Self-pay | Admitting: Internal Medicine

## 2016-01-19 LAB — PSA

## 2016-01-19 NOTE — Telephone Encounter (Signed)
Patient is scheduled for Alonza Bogus, PA at 10:30 tomorrow 01/20/16.  Wife notified

## 2016-01-19 NOTE — Telephone Encounter (Signed)
As noted below by Dr. [name], I informed patient of his PSA level. Patient verbalized understanding. 

## 2016-01-19 NOTE — Telephone Encounter (Signed)
-----   Message from Wyatt Portela, MD sent at 01/19/2016  8:19 AM EDT ----- Please let him know his PSA is very low.

## 2016-01-20 ENCOUNTER — Ambulatory Visit (INDEPENDENT_AMBULATORY_CARE_PROVIDER_SITE_OTHER): Payer: Medicare Other | Admitting: Gastroenterology

## 2016-01-20 ENCOUNTER — Encounter: Payer: Self-pay | Admitting: Gastroenterology

## 2016-01-20 VITALS — BP 102/68 | HR 88 | Ht 74.0 in | Wt 228.4 lb

## 2016-01-20 DIAGNOSIS — I2510 Atherosclerotic heart disease of native coronary artery without angina pectoris: Secondary | ICD-10-CM

## 2016-01-20 DIAGNOSIS — R188 Other ascites: Secondary | ICD-10-CM | POA: Diagnosis not present

## 2016-01-20 DIAGNOSIS — K7031 Alcoholic cirrhosis of liver with ascites: Secondary | ICD-10-CM

## 2016-01-20 DIAGNOSIS — I4891 Unspecified atrial fibrillation: Secondary | ICD-10-CM | POA: Diagnosis not present

## 2016-01-20 DIAGNOSIS — F1010 Alcohol abuse, uncomplicated: Secondary | ICD-10-CM | POA: Diagnosis not present

## 2016-01-20 DIAGNOSIS — C61 Malignant neoplasm of prostate: Secondary | ICD-10-CM | POA: Diagnosis not present

## 2016-01-20 DIAGNOSIS — E871 Hypo-osmolality and hyponatremia: Secondary | ICD-10-CM | POA: Diagnosis not present

## 2016-01-20 DIAGNOSIS — Z6829 Body mass index (BMI) 29.0-29.9, adult: Secondary | ICD-10-CM | POA: Diagnosis not present

## 2016-01-20 DIAGNOSIS — K7030 Alcoholic cirrhosis of liver without ascites: Secondary | ICD-10-CM | POA: Diagnosis not present

## 2016-01-20 NOTE — Patient Instructions (Signed)
You have been scheduled for an abdominal ultrasound at [facility] (1st floor of hospital) on Thursday 6-22 2017. Arrive at 0000000 am..  Make certain not to have anything to eat or drink 6 hours prior to your appointment. Should you need to reschedule your appointment, please contact radiology at [phone]. This test typically takes about 30 minutes to perform. At 10 Am they will do the Paracentesis.   Come to our lab on Monday  01-31-2016.

## 2016-01-21 ENCOUNTER — Telehealth: Payer: Self-pay | Admitting: Gastroenterology

## 2016-01-21 NOTE — Telephone Encounter (Signed)
Per instructions on AVS pt is to arrive to radiology at 9:15am, [place] at 9:30am. Pt is to be NPO for 6 hours before procedure. Left message to call back.

## 2016-01-24 NOTE — Telephone Encounter (Signed)
Reviewed instructions with patient's wife.

## 2016-01-25 ENCOUNTER — Ambulatory Visit (HOSPITAL_COMMUNITY)
Admission: RE | Admit: 2016-01-25 | Discharge: 2016-01-25 | Disposition: A | Discharge status: Home / Self Care | Payer: Medicare Other | Source: Ambulatory Visit | Attending: Gastroenterology | Admitting: Gastroenterology

## 2016-01-25 DIAGNOSIS — K7460 Unspecified cirrhosis of liver: Secondary | ICD-10-CM | POA: Diagnosis not present

## 2016-01-25 DIAGNOSIS — K7031 Alcoholic cirrhosis of liver with ascites: Secondary | ICD-10-CM | POA: Insufficient documentation

## 2016-01-25 DIAGNOSIS — R188 Other ascites: Secondary | ICD-10-CM | POA: Diagnosis not present

## 2016-01-25 LAB — GRAM STAIN

## 2016-01-25 LAB — BODY FLUID CELL COUNT WITH DIFFERENTIAL
EOS FL: 2 %
Lymphs, Fluid: 62 %
MONOCYTE-MACROPHAGE-SEROUS FLUID: 27 % — AB (ref 50–90)
Neutrophil Count, Fluid: 9 % (ref 0–25)
Total Nucleated Cell Count, Fluid: 38 cu mm (ref 0–1000)

## 2016-01-25 MED ORDER — LIDOCAINE HCL (PF) 1 % IJ SOLN
INTRAMUSCULAR | Status: AC
  Filled 2016-01-25: qty 10

## 2016-01-25 MED ORDER — ALBUMIN HUMAN 25 % IV SOLN
75.0000 g | Freq: Once | INTRAVENOUS | Status: AC
  Administered 2016-01-25: 75 g via INTRAVENOUS
  Filled 2016-01-25: qty 300

## 2016-01-25 NOTE — Procedures (Signed)
Ultrasound-guided diagnostic and therapeutic paracentesis performed yielding 8.9 liters of golden yellow fluid. No immediate complications. A portion of the fluid was sent to the lab for preordered studies. The pt will receive IV albumin postprocedure.

## 2016-01-26 LAB — ACID FAST SMEAR (AFB)

## 2016-01-26 LAB — ACID FAST SMEAR (AFB, MYCOBACTERIA): Acid Fast Smear: NEGATIVE

## 2016-01-27 ENCOUNTER — Ambulatory Visit (HOSPITAL_COMMUNITY): Payer: Medicare Other

## 2016-01-27 ENCOUNTER — Other Ambulatory Visit (INDEPENDENT_AMBULATORY_CARE_PROVIDER_SITE_OTHER): Payer: Medicare Other

## 2016-01-27 DIAGNOSIS — K7031 Alcoholic cirrhosis of liver with ascites: Secondary | ICD-10-CM | POA: Diagnosis not present

## 2016-01-27 LAB — CBC WITH DIFFERENTIAL/PLATELET
BASOS PCT: 0.4 % (ref 0.0–3.0)
Basophils Absolute: 0 10*3/uL (ref 0.0–0.1)
Eosinophils Absolute: 0.2 10*3/uL (ref 0.0–0.7)
Eosinophils Relative: 3.3 % (ref 0.0–5.0)
Hemoglobin: 9.1 g/dL — ABNORMAL LOW (ref 13.0–17.0)
LYMPHS ABS: 0.8 10*3/uL (ref 0.7–4.0)
LYMPHS PCT: 11.4 % — AB (ref 12.0–46.0)
MCHC: 34.5 g/dL (ref 30.0–36.0)
MONOS PCT: 6.3 % (ref 3.0–12.0)
Monocytes Absolute: 0.5 10*3/uL (ref 0.1–1.0)
NEUTROS ABS: 5.7 10*3/uL (ref 1.4–7.7)
Neutrophils Relative %: 78.6 % — ABNORMAL HIGH (ref 43.0–77.0)
PLATELETS: 150 10*3/uL (ref 150.0–400.0)
RBC: 2.24 Mil/uL — ABNORMAL LOW (ref 4.22–5.81)
WBC: 7.3 10*3/uL (ref 4.0–10.5)

## 2016-01-27 LAB — PROTIME-INR
INR: 1.9 ratio — ABNORMAL HIGH (ref 0.8–1.0)
Prothrombin Time: 20.6 s — ABNORMAL HIGH (ref 9.6–13.1)

## 2016-01-28 ENCOUNTER — Encounter: Payer: Self-pay | Admitting: Gastroenterology

## 2016-01-28 DIAGNOSIS — F1010 Alcohol abuse, uncomplicated: Secondary | ICD-10-CM | POA: Insufficient documentation

## 2016-01-28 DIAGNOSIS — E871 Hypo-osmolality and hyponatremia: Secondary | ICD-10-CM | POA: Insufficient documentation

## 2016-01-28 LAB — AFP TUMOR MARKER: AFP TUMOR MARKER: 2.3 ng/mL (ref <6.1)

## 2016-01-28 NOTE — Progress Notes (Signed)
     01/20/2016 Allen Schroeder [id number] Mar 24, 1951   History of Present Illness:  This 65 year old male with alcoholic cirrhosis with ascites is known to Dr. Carlean Purl.  Last seen in July 2016 after a hospitalization last spring for acute liver issues.  Has been drinking again. Liver functions much worse with hyperbilirubinemia of 11.6, significantly worsened from 2 months ago when total bili was 1.71.  Reports significant fluid accumulation in abdomen and fatigue.  Weight is up almost 40 pounds since he was seen last summer.  He was on Xtandi for prostate cancer but that has been held for now in light of his worsening liver disease.  Has hyponatremia as well with Na+ of 124.  Is on lasix 20 mg daily and spironolactone 25 mg daily.  Also on xifaxan 550 mg BID.   Current Medications, Allergies, Past Medical History, Past Surgical History, Family History and Social History were reviewed in [employer] record.   Physical Exam:  BP 102/68 mmHg  Pulse 88  Ht 6\' 2"  (1.88 m)  Wt 228 lb 6 oz (103.59 kg)  BMI 29.31 kg/m2 General: Well developed white male in no acute distress; jaundice noted. Head: Normocephalic and atraumatic Eyes:  Scleral icterus noted. Ears: Normal auditory acuity Lungs: Clear throughout to auscultation Heart: Regular rate and rhythm Abdomen: Soft, significantly distended with ascites fluid.  Non-tender. Musculoskeletal: Symmetrical with no gross deformities  Extremities: No edema  Neurological: Alert oriented x 4, grossly non-focal; slight asterixis noted. Psychological:  Alert and cooperative. Normal mood and affect  Assessment and Recommendations: -Alcoholic cirrhosis with ascites:  Has been drinking again. Liver functions much worse with hyperbilirubinemia.  We'll check labs today including CBC, AFP, and PT/INR. He is due for abdominal ultrasound for [place] screening and we will also schedule paracentesis with albumin infusion for every liter over 6 L  removed. We'll send labs for cell count, culture, Gram stain, cytology. We will recheck labs a few days following paracentesis and diuretics can be adjusted pending those results. We are going to just have diuretics today due to his recent hyponatremia which precludes [place] from doing so currently.   -ETOH abuse:  Needs ETOH abstinence.  *Follow-up with Dr. Carlean Purl in approximately 4 weeks.

## 2016-01-30 LAB — CULTURE, BODY FLUID W GRAM STAIN -BOTTLE: Culture: NO GROWTH

## 2016-01-30 LAB — CULTURE, BODY FLUID-BOTTLE

## 2016-01-31 ENCOUNTER — Telehealth: Payer: Self-pay | Admitting: *Deleted

## 2016-01-31 ENCOUNTER — Encounter: Payer: Self-pay | Admitting: *Deleted

## 2016-01-31 NOTE — Telephone Encounter (Signed)
-----   Message from Loralie Champagne, PA-C sent at 01/28/2016  5:37 PM EDT ----- Allen Schroeder,  I wanted this patient have a follow-up with Dr. Carlean Purl in about 4 weeks from the date of his office visit. Doesn't look like anything was scheduled, but he definitely needs follow-up. Could view addressed this for me?  Thank you,  Jess

## 2016-01-31 NOTE — Telephone Encounter (Signed)
Scheduled on 02/25/16 at 2:15 PM. Mailed letter with appointment to patient.

## 2016-02-10 ENCOUNTER — Other Ambulatory Visit: Payer: Self-pay

## 2016-02-10 MED ORDER — SOTALOL HCL 80 MG PO TABS: 80.0000 mg | ORAL_TABLET | Freq: Two times a day (BID) | ORAL | Status: DC

## 2016-02-10 NOTE — Telephone Encounter (Signed)
[facility]

## 2016-02-13 ENCOUNTER — Emergency Department (HOSPITAL_COMMUNITY): Payer: Medicare Other

## 2016-02-13 ENCOUNTER — Encounter (HOSPITAL_COMMUNITY): Payer: Self-pay | Admitting: Emergency Medicine

## 2016-02-13 ENCOUNTER — Inpatient Hospital Stay (HOSPITAL_COMMUNITY)
Admission: EM | Admit: 2016-02-13 | Discharge: 2016-02-18 | DRG: 432 | Disposition: A | Discharge status: Skilled Nursing Facility | Payer: Medicare Other | Attending: Internal Medicine | Admitting: Internal Medicine

## 2016-02-13 DIAGNOSIS — D638 Anemia in other chronic diseases classified elsewhere: Secondary | ICD-10-CM | POA: Diagnosis not present

## 2016-02-13 DIAGNOSIS — Z8546 Personal history of malignant neoplasm of prostate: Secondary | ICD-10-CM | POA: Diagnosis not present

## 2016-02-13 DIAGNOSIS — C7951 Secondary malignant neoplasm of bone: Secondary | ICD-10-CM | POA: Diagnosis present

## 2016-02-13 DIAGNOSIS — K767 Hepatorenal syndrome: Secondary | ICD-10-CM | POA: Diagnosis not present

## 2016-02-13 DIAGNOSIS — I10 Essential (primary) hypertension: Secondary | ICD-10-CM | POA: Diagnosis present

## 2016-02-13 DIAGNOSIS — D689 Coagulation defect, unspecified: Secondary | ICD-10-CM | POA: Diagnosis present

## 2016-02-13 DIAGNOSIS — F329 Major depressive disorder, single episode, unspecified: Secondary | ICD-10-CM | POA: Diagnosis present

## 2016-02-13 DIAGNOSIS — D696 Thrombocytopenia, unspecified: Secondary | ICD-10-CM | POA: Diagnosis not present

## 2016-02-13 DIAGNOSIS — N179 Acute kidney failure, unspecified: Secondary | ICD-10-CM | POA: Diagnosis present

## 2016-02-13 DIAGNOSIS — F1010 Alcohol abuse, uncomplicated: Secondary | ICD-10-CM | POA: Diagnosis present

## 2016-02-13 DIAGNOSIS — D539 Nutritional anemia, unspecified: Secondary | ICD-10-CM | POA: Diagnosis present

## 2016-02-13 DIAGNOSIS — R188 Other ascites: Secondary | ICD-10-CM

## 2016-02-13 DIAGNOSIS — Z9079 Acquired absence of other genital organ(s): Secondary | ICD-10-CM

## 2016-02-13 DIAGNOSIS — E785 Hyperlipidemia, unspecified: Secondary | ICD-10-CM | POA: Diagnosis present

## 2016-02-13 DIAGNOSIS — Z87891 Personal history of nicotine dependence: Secondary | ICD-10-CM

## 2016-02-13 DIAGNOSIS — E876 Hypokalemia: Secondary | ICD-10-CM | POA: Diagnosis present

## 2016-02-13 DIAGNOSIS — K7460 Unspecified cirrhosis of liver: Secondary | ICD-10-CM | POA: Diagnosis present

## 2016-02-13 DIAGNOSIS — E8770 Fluid overload, unspecified: Secondary | ICD-10-CM | POA: Diagnosis present

## 2016-02-13 DIAGNOSIS — Z8042 Family history of malignant neoplasm of prostate: Secondary | ICD-10-CM

## 2016-02-13 DIAGNOSIS — K760 Fatty (change of) liver, not elsewhere classified: Secondary | ICD-10-CM | POA: Diagnosis present

## 2016-02-13 DIAGNOSIS — Z66 Do not resuscitate: Secondary | ICD-10-CM | POA: Diagnosis present

## 2016-02-13 DIAGNOSIS — F1020 Alcohol dependence, uncomplicated: Secondary | ICD-10-CM | POA: Diagnosis not present

## 2016-02-13 DIAGNOSIS — I4891 Unspecified atrial fibrillation: Secondary | ICD-10-CM | POA: Diagnosis present

## 2016-02-13 DIAGNOSIS — I2510 Atherosclerotic heart disease of native coronary artery without angina pectoris: Secondary | ICD-10-CM | POA: Diagnosis present

## 2016-02-13 DIAGNOSIS — K5900 Constipation, unspecified: Secondary | ICD-10-CM | POA: Diagnosis present

## 2016-02-13 DIAGNOSIS — E871 Hypo-osmolality and hyponatremia: Secondary | ICD-10-CM | POA: Diagnosis present

## 2016-02-13 DIAGNOSIS — R17 Unspecified jaundice: Secondary | ICD-10-CM | POA: Insufficient documentation

## 2016-02-13 DIAGNOSIS — F411 Generalized anxiety disorder: Secondary | ICD-10-CM | POA: Diagnosis present

## 2016-02-13 DIAGNOSIS — K7290 Hepatic failure, unspecified without coma: Secondary | ICD-10-CM | POA: Diagnosis present

## 2016-02-13 DIAGNOSIS — K766 Portal hypertension: Secondary | ICD-10-CM | POA: Diagnosis present

## 2016-02-13 DIAGNOSIS — R0602 Shortness of breath: Secondary | ICD-10-CM | POA: Diagnosis not present

## 2016-02-13 DIAGNOSIS — Z955 Presence of coronary angioplasty implant and graft: Secondary | ICD-10-CM | POA: Diagnosis not present

## 2016-02-13 DIAGNOSIS — R251 Tremor, unspecified: Secondary | ICD-10-CM | POA: Diagnosis present

## 2016-02-13 DIAGNOSIS — D649 Anemia, unspecified: Secondary | ICD-10-CM | POA: Diagnosis present

## 2016-02-13 DIAGNOSIS — I959 Hypotension, unspecified: Secondary | ICD-10-CM | POA: Diagnosis present

## 2016-02-13 DIAGNOSIS — I482 Chronic atrial fibrillation: Secondary | ICD-10-CM | POA: Diagnosis not present

## 2016-02-13 DIAGNOSIS — K7011 Alcoholic hepatitis with ascites: Secondary | ICD-10-CM | POA: Diagnosis present

## 2016-02-13 DIAGNOSIS — K7031 Alcoholic cirrhosis of liver with ascites: Secondary | ICD-10-CM

## 2016-02-13 DIAGNOSIS — R9431 Abnormal electrocardiogram [ECG] [EKG]: Secondary | ICD-10-CM | POA: Diagnosis present

## 2016-02-13 DIAGNOSIS — R079 Chest pain, unspecified: Secondary | ICD-10-CM | POA: Diagnosis not present

## 2016-02-13 HISTORY — DX: Anemia, unspecified: D64.9

## 2016-02-13 HISTORY — DX: Hepatorenal syndrome: K76.7

## 2016-02-13 LAB — URINALYSIS, ROUTINE W REFLEX MICROSCOPIC
GLUCOSE, UA: NEGATIVE mg/dL
HGB URINE DIPSTICK: NEGATIVE
Ketones, ur: 15 mg/dL — AB
Nitrite: POSITIVE — AB
PROTEIN: NEGATIVE mg/dL
SPECIFIC GRAVITY, URINE: 1.016 (ref 1.005–1.030)
pH: 6.5 (ref 5.0–8.0)

## 2016-02-13 LAB — PREPARE RBC (CROSSMATCH)

## 2016-02-13 LAB — CBC WITH DIFFERENTIAL/PLATELET
BASOS PCT: 0 %
Basophils Absolute: 0 10*3/uL (ref 0.0–0.1)
EOS PCT: 2 %
Eosinophils Absolute: 0.2 10*3/uL (ref 0.0–0.7)
HEMATOCRIT: 23 % — AB (ref 39.0–52.0)
HEMOGLOBIN: 7.9 g/dL — AB (ref 13.0–17.0)
LYMPHS PCT: 7 %
Lymphs Abs: 0.8 10*3/uL (ref 0.7–4.0)
MCH: 42 pg — ABNORMAL HIGH (ref 26.0–34.0)
MCHC: 34.3 g/dL (ref 30.0–36.0)
MCV: 122.3 fL — AB (ref 78.0–100.0)
MONOS PCT: 8 %
Monocytes Absolute: 0.9 10*3/uL (ref 0.1–1.0)
NEUTROS PCT: 83 %
Neutro Abs: 9.4 10*3/uL — ABNORMAL HIGH (ref 1.7–7.7)
Platelets: 157 10*3/uL (ref 150–400)
RBC: 1.88 MIL/uL — AB (ref 4.22–5.81)
RDW: 21.9 % — ABNORMAL HIGH (ref 11.5–15.5)
WBC: 11.3 10*3/uL — ABNORMAL HIGH (ref 4.0–10.5)

## 2016-02-13 LAB — BASIC METABOLIC PANEL
Anion gap: 9 (ref 5–15)
BUN: 32 mg/dL — AB (ref 6–20)
CHLORIDE: 87 mmol/L — AB (ref 101–111)
CO2: 30 mmol/L (ref 22–32)
CREATININE: 1.67 mg/dL — AB (ref 0.61–1.24)
Calcium: 7.7 mg/dL — ABNORMAL LOW (ref 8.9–10.3)
GFR calc Af Amer: 47 mL/min — ABNORMAL LOW (ref >60)
GFR calc non Af Amer: 41 mL/min — ABNORMAL LOW (ref >60)
GLUCOSE: 88 mg/dL (ref 65–99)
Potassium: 3.2 mmol/L — ABNORMAL LOW (ref 3.5–5.1)
SODIUM: 126 mmol/L — AB (ref 135–145)

## 2016-02-13 LAB — MRSA PCR SCREENING: MRSA BY PCR: NEGATIVE

## 2016-02-13 LAB — HEPATIC FUNCTION PANEL
ALBUMIN: 1.9 g/dL — AB (ref 3.5–5.0)
ALT: 59 U/L (ref 17–63)
AST: 149 U/L — AB (ref 15–41)
Alkaline Phosphatase: 220 U/L — ABNORMAL HIGH (ref 38–126)
BILIRUBIN DIRECT: 17.5 mg/dL — AB (ref 0.1–0.5)
BILIRUBIN TOTAL: 31 mg/dL — AB (ref 0.3–1.2)
Indirect Bilirubin: 13.5 mg/dL — ABNORMAL HIGH (ref 0.3–0.9)
Total Protein: 7.2 g/dL (ref 6.5–8.1)

## 2016-02-13 LAB — URINE MICROSCOPIC-ADD ON
Bacteria, UA: NONE SEEN
RBC / HPF: NONE SEEN RBC/hpf (ref 0–5)
WBC, UA: NONE SEEN WBC/hpf (ref 0–5)

## 2016-02-13 LAB — I-STAT TROPONIN, ED: TROPONIN I, POC: 0 ng/mL (ref 0.00–0.08)

## 2016-02-13 LAB — PHOSPHORUS: Phosphorus: 4.7 mg/dL — ABNORMAL HIGH (ref 2.5–4.6)

## 2016-02-13 LAB — PROTIME-INR
INR: 1.87 — ABNORMAL HIGH (ref 0.00–1.49)
Prothrombin Time: 21.5 seconds — ABNORMAL HIGH (ref 11.6–15.2)

## 2016-02-13 LAB — AMMONIA: Ammonia: 19 umol/L (ref 9–35)

## 2016-02-13 LAB — ABO/RH: ABO/RH(D): O POS

## 2016-02-13 LAB — MAGNESIUM: Magnesium: 2.5 mg/dL — ABNORMAL HIGH (ref 1.7–2.4)

## 2016-02-13 MED ORDER — VITAMIN K1 10 MG/ML IJ SOLN
5.0000 mg | Freq: Once | INTRAMUSCULAR | Status: AC
  Administered 2016-02-13: 5 mg via SUBCUTANEOUS
  Filled 2016-02-13: qty 0.5

## 2016-02-13 MED ORDER — SODIUM CHLORIDE 0.9 % IV SOLN
Freq: Once | INTRAVENOUS | Status: AC
  Administered 2016-02-13: 12:00:00 via INTRAVENOUS

## 2016-02-13 MED ORDER — OXYMETAZOLINE HCL 0.05 % NA SOLN: 1.0000 | Freq: Every evening | NASAL | Status: DC | PRN

## 2016-02-13 MED ORDER — DEXTROSE 5 % IV SOLN
1.0000 g | INTRAVENOUS | Status: DC
  Administered 2016-02-13: 1 g via INTRAVENOUS
  Filled 2016-02-13: qty 10

## 2016-02-13 MED ORDER — THIAMINE HCL 100 MG/ML IJ SOLN
100.0000 mg | Freq: Every day | INTRAMUSCULAR | Status: DC
  Administered 2016-02-13 – 2016-02-14 (×2): 100 mg via INTRAVENOUS
  Filled 2016-02-13 (×2): qty 2

## 2016-02-13 MED ORDER — SODIUM CHLORIDE 0.9 % IV BOLUS (SEPSIS)
500.0000 mL | Freq: Once | INTRAVENOUS | Status: AC
  Administered 2016-02-13: 500 mL via INTRAVENOUS

## 2016-02-13 MED ORDER — OXYCODONE HCL 5 MG/5ML PO SOLN
5.0000 mg | ORAL | Status: DC | PRN
  Administered 2016-02-13 – 2016-02-18 (×17): 5 mg via ORAL
  Filled 2016-02-13 (×17): qty 5

## 2016-02-13 MED ORDER — DEXTROSE 5 % IV SOLN
2.0000 g | INTRAVENOUS | Status: DC
  Administered 2016-02-14 – 2016-02-16 (×3): 2 g via INTRAVENOUS
  Filled 2016-02-13 (×3): qty 2

## 2016-02-13 MED ORDER — DEXTROSE 5 % IV SOLN
1.0000 g | Freq: Once | INTRAVENOUS | Status: AC
  Administered 2016-02-13: 1 g via INTRAVENOUS
  Filled 2016-02-13: qty 10

## 2016-02-13 MED ORDER — RIFAXIMIN 550 MG PO TABS
550.0000 mg | ORAL_TABLET | Freq: Two times a day (BID) | ORAL | Status: DC
  Administered 2016-02-13 – 2016-02-18 (×10): 550 mg via ORAL
  Filled 2016-02-13 (×10): qty 1

## 2016-02-13 MED ORDER — FOLIC ACID 5 MG/ML IJ SOLN
1.0000 mg | Freq: Every day | INTRAMUSCULAR | Status: DC
  Administered 2016-02-13 – 2016-02-14 (×2): 1 mg via INTRAVENOUS
  Filled 2016-02-13 (×3): qty 0.2

## 2016-02-13 MED ORDER — MORPHINE SULFATE (PF) 2 MG/ML IV SOLN: 1.0000 mg | INTRAVENOUS | Status: DC | PRN

## 2016-02-13 MED ORDER — SODIUM CHLORIDE 0.9% FLUSH
3.0000 mL | Freq: Two times a day (BID) | INTRAVENOUS | Status: DC
  Administered 2016-02-13 – 2016-02-18 (×10): 3 mL via INTRAVENOUS

## 2016-02-13 NOTE — ED Provider Notes (Signed)
CSN: [id number]     Arrival date & time 02/13/16  [id number] History   First MD Initiated Contact with Patient 02/13/16 [phone]     Chief Complaint  Patient presents with  . Chest Pain  . Shortness of Breath     (Consider location/radiation/quality/duration/timing/severity/associated sxs/prior Treatment) HPI Comments: Patient is a 65 year old male with past medical history of cirrhosis, prostate cancer, atrial fibrillation, and hypertension. He presents for evaluation of chest discomfort, shortness of breath, and fatigue. This started yesterday after a long car trip home from the beach. He reports the symptoms began when he got up to walk this morning. He denies any nausea or diaphoresis. He denies any radiation to the arm or jaw.  Patient is a 65 y.o. male presenting with chest pain and shortness of breath. The history is provided by the patient.  Chest Pain Pain location:  Substernal area Pain quality: tightness   Pain radiates to:  Does not radiate Pain radiates to the back: no   Pain severity:  Moderate Onset quality:  Sudden Timing:  Constant Progression:  Unchanged Chronicity:  New Relieved by:  Nothing Worsened by:  Nothing tried Ineffective treatments:  None tried Associated symptoms: shortness of breath   Shortness of Breath Associated symptoms: chest pain     Past Medical History  Diagnosis Date  . Atrial fibrillation ([place])   . Other and unspecified hyperlipidemia   . Unspecified essential hypertension   . Elevated prostate specific antigen (PSA)   . Personal history of colonic polyps   . Hypocalcemia   . Acute alcoholic hepatitis   . Essential and other specified forms of tremor   . Anxiety state, unspecified   . Family history of malignant neoplasm of gastrointestinal tract   . Alcohol abuse with physiological dependence ([place]) 02/13/2012  . 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 Alcoholic hepatitis with ascites 11/13/2014  . Bacteremia, escherichia coli   . Coronary artery disease     s/p PCI to the LAD in 2011 with a BMS  . Dysrhythmia   . Pneumonia     childhood  . Depression   . Malignant neoplasm of prostate ([place]) 11/2007    Mets to Bone   Past Surgical History  Procedure Laterality Date  . Vasectomy    . Cholecystectomy    . Coronary stent placement  2011  . Colonoscopy w/ biopsies    . Cardiac catheterization    . Tee without cardioversion N/A 08/18/2015    Procedure: TRANSESOPHAGEAL ECHOCARDIOGRAM (TEE);  Surgeon: Thayer Headings, MD;  Location: [place];  Service: Cardiovascular;  Laterality: N/A;  . Cardioversion N/A 08/18/2015    Procedure: CARDIOVERSION;  Surgeon: Thayer Headings, MD;  Location: [facility] ENDOSCOPY;  Service: Cardiovascular;  Laterality: N/A;   Family History  Problem Relation Age of Onset  . Prostate cancer Father   . Coronary artery disease Father   . Colon cancer Father   . Aneurysm Mother   . Prostate cancer Brother    Social History  Substance Use Topics  . Smoking status: Former Smoker    Types: Cigarettes  . Smokeless tobacco: Never Used     Comment: stopped in college  . Alcohol Use: 1.2 - 1.8 oz/week    2-3 Shots of liquor per week     Comment: has consumed as much as a 1/2 gallon of vodka in a day, now drinks at  least 3-5 drinks per day.     Review of Systems  Respiratory: Positive for shortness of breath.   Cardiovascular: Positive for chest pain.  All other systems reviewed and are negative.     Allergies  Review of patient's allergies indicates no known allergies.  Home Medications   Prior to Admission medications   Medication Sig Start Date End Date Taking? Authorizing Provider  Calcium Carb-Cholecalciferol (CALCIUM + D3 PO) Take 1 tablet by mouth 2 (two) times daily.    Historical Provider, MD  [facility] ASPIRIN LOW DOSE 81 MG EC tablet TAKE 1 TABLET BY MOUTH EVERY DAY 09/20/15   Sherren Mocha, MD   folic acid (FOLVITE) 1 MG tablet Take 1 mg by mouth daily. 07/16/15   Historical Provider, MD  furosemide (LASIX) 20 MG tablet Take 20 mg by mouth daily.    Historical Provider, MD  Multiple Vitamin (MULTIVITAMIN WITH MINERALS) TABS tablet Take 1 tablet by mouth daily with breakfast.    Historical Provider, MD  oxyCODONE (OXY IR/ROXICODONE) 5 MG immediate release tablet Take 5 mg by mouth every 6 (six) hours as needed for severe pain.    Historical Provider, MD  oxymetazoline (AFRIN) 0.05 % nasal spray Place 1 spray into both nostrils at bedtime.    Historical Provider, MD  rifaximin (XIFAXAN) 550 MG TABS tablet Take 1 tablet (550 mg total) by mouth 2 (two) times daily. 12/29/14   Modena Jansky, MD  sotalol (BETAPACE) 80 MG tablet Take 1 tablet (80 mg total) by mouth 2 (two) times daily. 02/10/16   Lendon Colonel, NP  spironolactone (ALDACTONE) 25 MG tablet Take 25 mg by mouth daily.    Historical Provider, MD  sulfamethoxazole-trimethoprim (BACTRIM DS,SEPTRA DS) 800-160 MG tablet Take 1 tablet by mouth daily.    Historical Provider, MD  thiamine 100 MG tablet Take 100 mg by mouth daily.    Historical Provider, MD  XTANDI 40 MG capsule TAKE 4 CAPSULES BY MOUTH DAILY 10/04/15   Wyatt Portela, MD   BP 89/58 mmHg  Pulse 52  Temp(Src) 98 F (36.7 C)  Resp 13  SpO2 100% Physical Exam  Constitutional: He is oriented to person, place, and time.  Patient is a chronically ill-appearing male in no acute distress. He is awake and alert, but somewhat slow to respond.  HENT:  Head: Normocephalic and atraumatic.  Eyes: EOM are normal. Pupils are equal, round, and reactive to light. Scleral icterus is present.  Neck: Normal range of motion. Neck supple.  Cardiovascular: Normal rate and regular rhythm.  Exam reveals no friction rub.   No murmur heard. Pulmonary/Chest: Effort normal and breath sounds normal. No respiratory distress. He has no wheezes. He has no rales.  Abdominal: Soft. Bowel sounds  are normal. He exhibits distension. There is no tenderness.  There is ascites and fluid wave present with palpation of the abdomen. There is no tenderness.  Musculoskeletal: Normal range of motion. He exhibits no edema.  Neurological: He is alert and oriented to person, place, and time. No cranial nerve deficit. Coordination normal.  Skin:  Skin is extremely jaundiced.  Nursing note and vitals reviewed.   ED Course  Procedures (including critical care time) Labs Review Labs Reviewed  BASIC METABOLIC PANEL  AMMONIA  CBC WITH DIFFERENTIAL/PLATELET  HEPATIC FUNCTION PANEL  PROTIME-INR  URINALYSIS, ROUTINE W REFLEX MICROSCOPIC (NOT AT [facility])  [place], ED  I-STAT TROPOININ, ED    Imaging Review No results found. I have personally reviewed and  evaluated these images and lab results as part of my medical decision-making.   EKG Interpretation   Date/Time:  Sunday February 13 2016 09:30:28 EDT Ventricular Rate:  52 PR Interval:    QRS Duration: 113 QT Interval:  545 QTC Calculation: 507 R Axis:   -2 Text Interpretation:  Sinus rhythm Borderline intraventricular conduction  delay Low voltage, precordial leads Prolonged QT interval Confirmed by  Catheryn Slifer  MD, Trumaine Wimer ([id number]) on 02/13/2016 9:44:09 AM      MDM   Final diagnoses:  None    Patient with history of advanced cirrhosis presents with increased weakness, fatigue, chest discomfort, and abdominal distention. He has recurrent ascites of the abdomen as well as significantly worsening liver function. He is markedly jaundiced and his total bili is over 30. I have discussed this finding with Dr. Henrene Pastor from gastroenterology. He is recommending admission to the hospitalist service for ultrasound-guided paracentesis and rule out of SBP. I've spoken with Ebony Hail from the hospitalist service who will evaluate the patient and admit under the care of Dr. Eliseo Squires.    Veryl Speak, MD 02/13/16 [phone]

## 2016-02-13 NOTE — ED Notes (Signed)
Attempted Report 

## 2016-02-13 NOTE — H&P (Signed)
History and Physical    Allen ROGALSKI [id number] DOB: 08-24-1950 DOA: 02/13/2016   PCP: [name], MD   Patient coming from/Resides with: Private residence/lives with spouse  Chief Complaint: Central chest pressure and shortness of breath  HPI: Allen Schroeder is a 65 y.o. male with medical history significant for alcoholic cirrhosis with associated ascites, portal hypertension and history of thrombocytopenia. So has history of chronic anemia, and CAD with prior stent and LAD, atrial fibrillation on Betapace, hypertension, anxiety disorder, history of treatment for prostate cancer, and dyslipidemia. Patient recently returned to the [place] area after [place] at the beach. He awakened this morning complaining of central chest pressure without nausea, diaphoresis or color changes. He did not have any GI symptoms just reflux. He has not had any GI bleed symptoms such as melena or dark stools. He was also having shortness of breath with attempts to mobilize. He is not had any change in his stable chronic lower extremity edema. He thought he was having blood in his urine and describes fissuring as being very brown in color.  In review of his outpatient gastroenterology records, patient was evaluated on 6/23 and found to have marked increase in total bilirubin to 11.6 when 2 months prior it was 1.7. He also had increasing ascites and admitted to the providers he was drinking again. He was sent for an outpatient paracentesis where 8.9 L of golden yellow fluid was removed and he was given albumin to assist with maintenance of blood pressure. At that time he was continued on his Lasix 20 daily and spironolactone 25 mg daily. Xifaxan was continued. Because of his progressive liver dysfunction his Xtandi for his history of prostate cancer had been held  ED Course:  Vital signs: PO 98.3-80/49 pulse 53-respirations 13-room air saturations 96% 2 view chest x-ray: Bibasilar atelectasis Lab  data: Sodium 126, potassium 3.2, BUN 32, creatinine 1.67, alkaline phosphatase 220, albumin 1.9, AST 149, ALT 59, direct bilirubin 17.5, indirect bilirubin 13.5, total bilirubin 31, ammonia level 19, troponin 0.00, white count 11,300 with neutrophils 83% and absolute neutrophils 9.4%; PT 21.5, INR 1.87 glucose 88 Medications and treatments: Normal saline bolus 500 mL  Review of Systems:  In addition to the HPI above,  No Fever-chills, myalgias or other constitutional symptoms No Headache, changes with Vision or hearing, new weakness, tingling, numbness in any extremity, has chronic weakness and dysmotility No problems swallowing food or Liquids, indigestion/reflux No Cough, palpitations No N/V; no melena or hematochezia, no dark tarry stools-reports chronic anorexia No dysuria, hematuria or flank pain-reports dark brown urine No new skin rashes, lesions, masses or bruises, No new joints pains-aches No recent weight gain or loss No polyuria, polydypsia or polyphagia,   Past Medical History  Diagnosis Date  . Atrial fibrillation ([place])   . Other and unspecified hyperlipidemia   . Unspecified essential hypertension   . Elevated prostate specific antigen (PSA)   . Personal history of colonic polyps   . Hypocalcemia   . Acute alcoholic hepatitis   . Essential and other specified forms of tremor   . Anxiety state, unspecified   . Family history of malignant neoplasm of gastrointestinal tract   . Alcohol abuse with physiological dependence ([place]) 02/13/2012  . Bisphosphonate-associated osteonecrosis of the jaw ([place]) 04/28/2013    Localized area base of tooth #18 noted 8/14 by oral surgeon  . Alcoholic hepatitis with ascites 11/13/2014  . Bacteremia, escherichia coli   . Coronary artery disease  s/p PCI to the LAD in 2011 with a BMS  . Dysrhythmia   . Pneumonia     childhood  . Depression   . Malignant neoplasm of prostate ([place]) 11/2007    Mets to Bone  . Hepatorenal syndrome ([place])  02/13/2016    Past Surgical History  Procedure Laterality Date  . Vasectomy    . Cholecystectomy    . Coronary stent placement  2011  . Colonoscopy w/ biopsies    . Cardiac catheterization    . Tee without cardioversion N/A 08/18/2015    Procedure: TRANSESOPHAGEAL ECHOCARDIOGRAM (TEE);  Surgeon: Thayer Headings, MD;  Location: [facility];  Service: Cardiovascular;  Laterality: N/A;  . Cardioversion N/A 08/18/2015    Procedure: CARDIOVERSION;  Surgeon: Thayer Headings, MD;  Location: [facility];  Service: Cardiovascular;  Laterality: N/A;    Social History   Social History  . Marital Status: Married    Spouse Name: N/A  . Number of Children: 3  . Years of Education: N/A   Occupational History  . lawyer     VP of [employer] History Main Topics  . Smoking status: Former Smoker    Types: Cigarettes  . Smokeless tobacco: Never Used     Comment: stopped in college  . Alcohol Use: 1.2 - 1.8 oz/week    2-3 Shots of liquor per week     Comment: has consumed as much as a 1/2 gallon of vodka in a day, now drinks at least 3-5 drinks per day.   . Drug Use: No  . Sexual Activity: No   Other Topics Concern  . Not on file   Social History Narrative   Patient is married. 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 He has two sons, one daughter and 5 grandchildren. Both sons work in the business. Daughter lives in [place]. Close knit family.    11/18/2014       Mobility: Utilizes either a cane or a walker Work history: Retired Programme researcher, broadcasting/film/video as well as has function and the role of [employer]   No Known Allergies  Family History  Problem Relation Age of Onset  . Prostate cancer Father   . Coronary artery disease Father   . Colon cancer Father   . Aneurysm Mother   . Prostate cancer Brother       Prior to Admission medications   Medication Sig Start Date End Date Taking? Authorizing Provider  [facility] ASPIRIN LOW DOSE 81 MG EC tablet TAKE 1 TABLET BY MOUTH EVERY DAY Patient taking differently: TAKE 1 TABLET BY MOUTH EVERY morning 09/20/15  Yes Sherren Mocha, MD  folic acid (FOLVITE) 1 MG tablet Take 1 mg by mouth daily. 07/16/15  Yes Historical Provider, MD  furosemide (LASIX) 20 MG tablet Take 20 mg by mouth daily.   Yes Historical Provider, MD  rifaximin (XIFAXAN) 550 MG TABS tablet Take 1 tablet (550 mg total) by mouth 2 (two) times daily. 12/29/14  Yes Modena Jansky, MD  sotalol (BETAPACE) 80 MG tablet Take 1 tablet (80 mg total) by mouth 2 (two) times daily. 02/10/16  Yes Lendon Colonel, NP  spironolactone (ALDACTONE) 25 MG tablet Take 25 mg by mouth daily.   Yes Historical Provider, MD  sulfamethoxazole-trimethoprim (BACTRIM DS,SEPTRA DS) 800-160 MG tablet Take 1 tablet by  mouth daily.   Yes Historical Provider, MD  Vitamins-Lipotropics ([facility] BALANCED B-100 PO) Take 1 tablet by mouth daily.   Yes Historical Provider, MD  Calcium Carb-Cholecalciferol (CALCIUM + D3 PO) Take 1 tablet by mouth 2 (two) times daily.    Historical Provider, MD  Multiple Vitamin (MULTIVITAMIN WITH MINERALS) TABS tablet Take 1 tablet by mouth daily with breakfast.    Historical Provider, MD  oxyCODONE (OXY IR/ROXICODONE) 5 MG immediate release tablet Take 5 mg by mouth every 6 (six) hours as needed for severe pain.    Historical Provider, MD  oxymetazoline (AFRIN) 0.05 % nasal spray Place 1 spray into both nostrils at bedtime.    Historical Provider, MD  thiamine 100 MG tablet Take 100 mg by mouth daily.    Historical Provider, MD  XTANDI 40 MG capsule TAKE 4 CAPSULES BY MOUTH DAILY 10/04/15   Wyatt Portela, MD    Physical Exam: Filed Vitals:   02/13/16 1100 02/13/16 1115 02/13/16 1130 02/13/16 1145  BP: 84/60 86/54 98/68  93/61  Pulse: 49 51 63 51  Temp:      Resp: 19 15 16 17   SpO2: 99% 98% 98%  97%      Constitutional: Appears toxic and profoundly jaundiced, but he is calm, comfortable Eyes: PERRL, lids and conjunctivae and sclera with profound icterus ENMT: Mucous membranes are moist. Posterior pharynx clear of any exudate or lesions.Normal dentition.  Neck: normal, supple, no masses, no thyromegaly Respiratory: clear to auscultation bilaterally but diminished in bases bilaterally, no wheezing, no crackles. Normal respiratory effort. No accessory muscle use.  Cardiovascular: Regular rate of cardiac rate and rhythm, no murmurs / rubs / gallops. No extremity edema. 2+ pedal pulses. No carotid bruits.  Abdomen: no tenderness, no masses palpated. Marked ascites with abdomen distended but soft with positive fluid wave and nontender Bowel sounds positive but are hypoactive Musculoskeletal: no clubbing / cyanosis. No joint deformity upper and lower extremities. Good ROM, no contractures. Normal muscle tone.  Skin: no rashes, lesions, ulcers. No induration-marked diffuse jaundice Neurologic: CN 2-12 grossly intact. Sensation intact, DTR normal. Strength 5/5 x all 4 extremities.  Psychiatric: Alert and oriented x 3 although may have a little bit of mild confusion but otherwise insight and judgment appear appropriate based on discussions held with patient in the presence of his family. Normal mood. Jocular at times    Labs on Admission: I have personally reviewed following labs and imaging studies  CBC:  Recent Labs Lab 02/13/16 0930  WBC 11.3*  NEUTROABS 9.4*  HGB 7.9*  HCT 23.0*  MCV 122.3*  PLT [id number]   Basic Metabolic Panel:  Recent Labs Lab 02/13/16 0930  NA 126*  K 3.2*  CL 87*  CO2 30  GLUCOSE 88  BUN 32*  CREATININE 1.67*  CALCIUM 7.7*   GFR: CrCl cannot be calculated (Unknown ideal weight.). Liver Function Tests:  Recent Labs Lab 02/13/16 0930  AST 149*  ALT 59  ALKPHOS 220*  BILITOT 31.0*  PROT 7.2  ALBUMIN 1.9*   No results for input(s): LIPASE,  AMYLASE in the last 168 hours.  Recent Labs Lab 02/13/16 0936  AMMONIA 19   Coagulation Profile:  Recent Labs Lab 02/13/16 0930  INR 1.87*   Cardiac Enzymes: No results for input(s): CKTOTAL, CKMB, CKMBINDEX, TROPONINI in the last 168 hours. BNP (last 3 results) No results for input(s): PROBNP in the last 8760 hours. HbA1C: No results for input(s): HGBA1C in the last 72 hours. CBG: No results  for input(s): GLUCAP in the last 168 hours. Lipid Profile: No results for input(s): CHOL, HDL, LDLCALC, TRIG, CHOLHDL, LDLDIRECT in the last 72 hours. Thyroid Function Tests: No results for input(s): TSH, T4TOTAL, FREET4, T3FREE, THYROIDAB in the last 72 hours. Anemia Panel: No results for input(s): VITAMINB12, FOLATE, FERRITIN, TIBC, IRON, RETICCTPCT in the last 72 hours. Urine analysis:    Component Value Date/Time   COLORURINE YELLOW 12/21/2014 1816   APPEARANCEUR CLEAR 12/21/2014 1816   LABSPEC 1.010 12/21/2014 1816   PHURINE 6.0 12/21/2014 1816   GLUCOSEU NEGATIVE 12/21/2014 1816   HGBUR TRACE* 12/21/2014 1816   BILIRUBINUR NEGATIVE 12/21/2014 1816   KETONESUR NEGATIVE 12/21/2014 1816   PROTEINUR 30* 12/21/2014 1816   UROBILINOGEN 0.2 12/21/2014 1816   NITRITE POSITIVE* 12/21/2014 1816   LEUKOCYTESUR SMALL* 12/21/2014 1816   Sepsis Labs: @LABRCNTIP (procalcitonin:4,lacticidven:4) )No results found for this or any previous visit (from the past 240 hour(s)).   Radiological Exams on Admission: Dg Chest 2 View  02/13/2016  CLINICAL DATA:  Short of breath EXAM: CHEST  2 VIEW COMPARISON:  12/21/2014 FINDINGS: Normal heart size. Low volumes. Bibasilar atelectasis. No pneumothorax or pleural effusion. IMPRESSION: Bibasilar atelectasis. Electronically Signed   By: Marybelle Killings M.D.   On: 02/13/2016 10:33    EKG: (Independently reviewed) Sinus rhythm with bradycardic rate 52 bpm, prolonged QTC 507 ms, no ischemic changes  Assessment/Plan Principal Problem:   Decompensation of  cirrhosis of liver / Hepatorenal syndrome /Hypotension -Patient with known alcoholic cirrhosis who had resumed drinking up until about 2 weeks ago who now has progressive hepatic dysfunction with previous baseline total bili around 1.7 now total bilirubin 31 -Baseline renal function: 7.3/0.8 and current renal function: 32/1.67 -Await formal consultation with Dr. Henrene Pastor -Prognosis appears poor -Continue to hold offending medications as prior to admission -Patient complaining of generalized pain especially in right upper quadrant so will allow concentrated morphine liquid prn along with IV morphine for breakthrough pain -Repeat labs in a.m. -Interestingly ammonia level is normal and this confirms patient's ascertation that he has been adherent with all of his preadmission medications-continue rifaximin -Because of acute hepatorenal syndrome will hold diuretics for now -Blood pressure somewhat soft and likely due to acute volume shifting and known significant hypovolemia and known ascites-is given 500 mL saline bolus in the ER -MELD score: 31 -MELDNa score: 34  Active Problems:   Symptomatic anemia -Baseline: March was 11 and June 9.6-- today 7.9 -Type and crossmatch and administer 2 units of packed red blood cells -CBC post transfusion -Suspect hypotension reflective not only of volume shifting that of anemia -IV thiamine and folic acid    Coagulopathy -Secondary to cirrhosis -In setting of impending invasive procedure (paracentesis) give 1 dose vitamin K    Ascites/Portal hypertension  -Anticipate will require paracentesis this admission but blood pressure is to be more stable before proceeding-will place tentative order for diagnostic paracentesis tomorrow morning (per telephone discussion with Dr. Henrene Pastor) -Patient has mild leukocytosis with left shift so we'll begin empiric Rocephin to treat for SBP -Check blood cultures      CAD, NATIVE VESSEL/  History of placement of stent in LAD  coronary artery -Reported epigastric chest pain without any typical symptoms and also reporting right upper quadrant abdominal pain -Initial troponin and EKG reassuring -Given severity of liver decompensation will not cycle enzymes or work this up further since patient is not a candidate for intervention    Atrial fibrillation/Prolonged Q-T interval on ECG -Hold preadmission Betapace -CHADVASc= 3  Alcohol abuse with physiological dependence -Patient reports quit drinking at the end of June after GI visit     HLD (hyperlipidemia) -Not a candidate for statin therapy based on severity of liver disease    Essential hypertension -currently hypotensive and volume depleted    Generalized anxiety disorder      DVT prophylaxis: SCDs  Code Status: DO NOT RESUSCITATE after lengthy discussion with both patient and wife as well as jaundice son at bedside  Family Communication: Wife, Ember Pullum as well as son at bedside Disposition Plan: Uncertain at time of admission; patient may have irreversible decompensation of known alcoholic cirrhosis and may need to discharge at most with home health hospice versus to a residential hospice facility Consults called: Gastroenterology/[place] Admission status: Inpatient/stepdown    Samella Parr ANP-BC [facility] Pager [phone]   If 7PM-7AM, please contact night-coverage [email or web address] Password [email or web address]  02/13/2016, 12:46 PM

## 2016-02-13 NOTE — ED Notes (Signed)
Per EMS- pt had a 7 hour car ride yesterday. Got up to go to the bathroom with his walker this morning and began feeling short of breathe with centralized throbbing chest pain. Pt has hx of afib with cardioversion. Pt also is jaundice but pt states this is his normal. Pt also noted to have distended abdomen. 18G PIV to [place]. 324 ASA given. No nitro given. BP 90 systolic.

## 2016-02-14 ENCOUNTER — Inpatient Hospital Stay (HOSPITAL_COMMUNITY): Payer: Medicare Other

## 2016-02-14 DIAGNOSIS — N179 Acute kidney failure, unspecified: Secondary | ICD-10-CM

## 2016-02-14 DIAGNOSIS — D696 Thrombocytopenia, unspecified: Secondary | ICD-10-CM

## 2016-02-14 DIAGNOSIS — D638 Anemia in other chronic diseases classified elsewhere: Secondary | ICD-10-CM

## 2016-02-14 DIAGNOSIS — K7031 Alcoholic cirrhosis of liver with ascites: Secondary | ICD-10-CM

## 2016-02-14 DIAGNOSIS — K7290 Hepatic failure, unspecified without coma: Secondary | ICD-10-CM

## 2016-02-14 DIAGNOSIS — R17 Unspecified jaundice: Secondary | ICD-10-CM

## 2016-02-14 DIAGNOSIS — F1020 Alcohol dependence, uncomplicated: Secondary | ICD-10-CM

## 2016-02-14 DIAGNOSIS — I482 Chronic atrial fibrillation: Secondary | ICD-10-CM

## 2016-02-14 LAB — CBC
HCT: 26.4 % — ABNORMAL LOW (ref 39.0–52.0)
Hemoglobin: 9.4 g/dL — ABNORMAL LOW (ref 13.0–17.0)
MCH: 39.5 pg — ABNORMAL HIGH (ref 26.0–34.0)
MCHC: 35.6 g/dL (ref 30.0–36.0)
MCV: 110.3 fL — AB (ref 78.0–100.0)
PLATELETS: 127 10*3/uL — AB (ref 150–400)
RBC: 2.38 MIL/uL — AB (ref 4.22–5.81)
RDW: 28.4 % — AB (ref 11.5–15.5)
WBC: 10.3 10*3/uL (ref 4.0–10.5)

## 2016-02-14 LAB — TYPE AND SCREEN
ABO/RH(D): O POS
ANTIBODY SCREEN: NEGATIVE
UNIT DIVISION: 0
Unit division: 0

## 2016-02-14 LAB — COMPREHENSIVE METABOLIC PANEL
ALK PHOS: 207 U/L — AB (ref 38–126)
ALT: 58 U/L (ref 17–63)
AST: 140 U/L — AB (ref 15–41)
Albumin: 1.9 g/dL — ABNORMAL LOW (ref 3.5–5.0)
Anion gap: 9 (ref 5–15)
BILIRUBIN TOTAL: 31.3 mg/dL — AB (ref 0.3–1.2)
BUN: 28 mg/dL — AB (ref 6–20)
CALCIUM: 7.5 mg/dL — AB (ref 8.9–10.3)
CHLORIDE: 90 mmol/L — AB (ref 101–111)
CO2: 29 mmol/L (ref 22–32)
CREATININE: 1.39 mg/dL — AB (ref 0.61–1.24)
GFR, EST AFRICAN AMERICAN: 59 mL/min — AB (ref >60)
GFR, EST NON AFRICAN AMERICAN: 51 mL/min — AB (ref >60)
Glucose, Bld: 84 mg/dL (ref 65–99)
Potassium: 3.1 mmol/L — ABNORMAL LOW (ref 3.5–5.1)
Sodium: 128 mmol/L — ABNORMAL LOW (ref 135–145)
Total Protein: 6.7 g/dL (ref 6.5–8.1)

## 2016-02-14 LAB — PROTIME-INR
INR: 2.04 — AB (ref 0.00–1.49)
Prothrombin Time: 22.9 seconds — ABNORMAL HIGH (ref 11.6–15.2)

## 2016-02-14 LAB — AMMONIA: AMMONIA: 61 umol/L — AB (ref 9–35)

## 2016-02-14 LAB — APTT: aPTT: 38 seconds — ABNORMAL HIGH (ref 24–37)

## 2016-02-14 LAB — OCCULT BLOOD X 1 CARD TO LAB, STOOL: Fecal Occult Bld: POSITIVE — AB

## 2016-02-14 MED ORDER — POTASSIUM CHLORIDE CRYS ER 20 MEQ PO TBCR
40.0000 meq | EXTENDED_RELEASE_TABLET | Freq: Once | ORAL | Status: AC
  Administered 2016-02-14: 40 meq via ORAL
  Filled 2016-02-14: qty 2

## 2016-02-14 MED ORDER — LACTULOSE 10 GM/15ML PO SOLN
30.0000 g | Freq: Every day | ORAL | Status: DC
  Administered 2016-02-14 – 2016-02-17 (×4): 30 g via ORAL
  Filled 2016-02-14 (×5): qty 45

## 2016-02-14 MED ORDER — VITAMIN K1 10 MG/ML IJ SOLN
5.0000 mg | Freq: Every day | INTRAVENOUS | Status: AC
  Administered 2016-02-14 – 2016-02-15 (×2): 5 mg via INTRAVENOUS
  Filled 2016-02-14 (×2): qty 0.5

## 2016-02-14 MED ORDER — VITAMIN B-1 100 MG PO TABS
100.0000 mg | ORAL_TABLET | Freq: Every day | ORAL | Status: DC
  Administered 2016-02-14 – 2016-02-18 (×5): 100 mg via ORAL
  Filled 2016-02-14 (×5): qty 1

## 2016-02-14 MED ORDER — FOLIC ACID 1 MG PO TABS
1.0000 mg | ORAL_TABLET | Freq: Every day | ORAL | Status: DC
  Administered 2016-02-14 – 2016-02-18 (×5): 1 mg via ORAL
  Filled 2016-02-14 (×5): qty 1

## 2016-02-14 MED ORDER — ALBUMIN HUMAN 25 % IV SOLN
25.0000 g | Freq: Four times a day (QID) | INTRAVENOUS | Status: AC
  Administered 2016-02-14 – 2016-02-15 (×4): 25 g via INTRAVENOUS
  Filled 2016-02-14: qty 50
  Filled 2016-02-14: qty 100
  Filled 2016-02-14 (×2): qty 50
  Filled 2016-02-14: qty 100

## 2016-02-14 NOTE — Care Management Note (Signed)
Case Management Note  Patient Details  Name: Allen Schroeder MRN: [id number] Date of Birth: Feb 25, 1951  Subjective/Objective:     alcoholic cirrhosis with associated ascites               Action/Plan: Discharge Planning:  Pt lives at home with wife, independent prior to hospital stay. Waiting final recommendations for home.    Expected Discharge Date:                  Expected Discharge Plan:  Home/Self Care  In-House Referral:  NA  Discharge planning Services  CM Consult  Post Acute Care Choice:    Choice offered to:     DME Arranged:    DME Agency:     HH Arranged:    HH Agency:     Status of Service:  In process, will continue to follow  If discussed at Long Length of Stay Meetings, dates discussed:    Additional Comments:  Erenest Rasher, RN 02/14/2016, 4:11 PM

## 2016-02-14 NOTE — Care Management Important Message (Signed)
Important Message  Patient Details  Name: Allen Schroeder MRN: [id number] Date of Birth: [birth year]   Medicare Important Message Given:  Yes    Aryaa Bunting, [place] 02/14/2016, 8:25 AM

## 2016-02-14 NOTE — Progress Notes (Addendum)
Patient ID: Allen Schroeder, male   DOB: [birth year], 65 y.o.   MRN: [id number]  PROGRESS NOTE    Allen Schroeder  [id number] DOB: 1950-11-15 DOA: 02/13/2016  PCP: [name], MD   Brief Narrative:  65 y.o. Male with past medical history of metastatic prostate cancer (dx 2009) s/p orchiectomy, bone metastases , Xtandi on hold due to liver disease, alcohol abuse, atrial fibrillation with RVR noted in 08/2015 and s/p succesful DCCV, then started on Pradaxa in place of Plavix.Cardiologist did not restart Plavix and pt now on sotalol and aspirin. Underwent 8.9 liter paracentesis on 01/25/16 with fluid WBCs just 27. Received IV Albumin post tap. Ultrasound post tap showed cirrhosis, small amount ascites, no liver tumor. At that point he ceased ETOH.He presented to [facility] ED with reports of chest pressure and dyspnea at rest and with exertion. Blood work showed INR 1.87, WBC 11.3, hgb 7.9, platelets 157. Ammonia was 19. AST 149, normal ALT, ALP 220, T bili 31 with DB 17.5, albumin 1.9. Pt was seen by GI in consultation.    Assessment & Plan:   Principal Problem:   Decompensation of cirrhosis of liver (HCC) / Alcoholic cirrhosis of liver with ascites (HCC) / Abnormal LFT's / Obstructive jaundice / Leukocytosis  - Decompensated cirrhosis with marked hyperbilirubinemia, large volume ascites, coagulopathy, mild AKI and mild HE - GI did not recommend paracentesis this AM since pt is not in respiratory distress and his renal function is decreased. IV albumin given instead of crystalloid fluids - Unfortunately, he is not a liver transplant candidate due to recent alcohol use. - Continue rocephin for possible SBP - Monitor Cr, INR and bilirubin - Continue lactulose and rifaximin  Active Problems:   Alcohol abuse - Continue folic acid, thiamine    Coagulopathy - Due to liver failure - Vitamin K given 5 mg IV daily    Atrial fibrillation (HCC) - CHADS vasc score 2 - INR 2.04 as pt coagulopathic from  liver failure - HR 54-67 without beta blockers    Anemia of chronic disease / Thrombocytopenia  - Due to bone marrow suppression from liver failure - Hemoglobin 7.9 --> 9.4 - Platelets 127    Hypokalemia - Due to poor PO intake - Continue to supplement     Ascites  - Secondary to liver cirrhosis - No need for paracentesis as no resp distress    Hepatorenal syndrome (HCC) - Cr 1.39 - Monitor BMP in am   DVT prophylaxis: SCD's bilaterally  Code Status: DNR/DNI Family Communication: no family at the bedside this am  Disposition Plan: remains in SDU due to hypotension    Consultants:   GI   Procedures:   None   Antimicrobials:   Rocephin 02/13/2016 -->    Subjective: No overnight events.   Objective: Filed Vitals:   02/14/16 0432 02/14/16 0819 02/14/16 1219 02/14/16 1603  BP: 96/53 89/58 89/59  95/56  Pulse: 62 62 54 67  Temp: 98.2 F (36.8 C) 97.8 F (36.6 C) 98 F (36.7 C) 98.3 F (36.8 C)  TempSrc: Oral Oral Oral Oral  Resp: 16 14 18 15   Height:      Weight:      SpO2: 98% 100% 97% 98%    Intake/Output Summary (Last 24 hours) at 02/14/16 1952 Last data filed at 02/14/16 1930  Gross per 24 hour  Intake    450 ml  Output   1825 ml  Net  -1375 ml   Filed Weights   02/13/16  1337  Weight: 91.6 kg (201 lb 15.1 oz)    Examination:  General exam: Appears calm and comfortable; icteric skin  Respiratory system: Clear to auscultation. Respiratory effort normal. Cardiovascular system: S1 & S2 heard, Rate controlled  Gastrointestinal system: Abdomen is softly distended, (+) BS, non tender abdomen  Central nervous system: Alert and oriented. No focal neurological deficits. Extremities: Symmetric 5 x 5 power.  Skin: No rashes, lesions or ulcers Psychiatry: Judgement and insight appear normal. Mood & affect appropriate.   Data Reviewed: I have personally reviewed following labs and imaging studies  CBC:  Recent Labs Lab 02/13/16 0930 02/14/16 0528   WBC 11.3* 10.3  NEUTROABS 9.4*  --   HGB 7.9* 9.4*  HCT 23.0* 26.4*  MCV 122.3* 110.3*  PLT 157 [id number]*   Basic Metabolic Panel:  Recent Labs Lab 02/13/16 0930 02/13/16 1415 02/14/16 0528  NA 126*  --  128*  K 3.2*  --  3.1*  CL 87*  --  90*  CO2 30  --  29  GLUCOSE 88  --  84  BUN 32*  --  28*  CREATININE 1.67*  --  1.39*  CALCIUM 7.7*  --  7.5*  MG  --  2.5*  --   PHOS  --  4.7*  --    GFR: Estimated Creatinine Clearance: 60 mL/min (by C-G formula based on Cr of 1.39). Liver Function Tests:  Recent Labs Lab 02/13/16 0930 02/14/16 0528  AST 149* 140*  ALT 59 58  ALKPHOS 220* 207*  BILITOT 31.0* 31.3*  PROT 7.2 6.7  ALBUMIN 1.9* 1.9*   No results for input(s): LIPASE, AMYLASE in the last 168 hours.  Recent Labs Lab 02/13/16 0936 02/14/16 0528  AMMONIA 19 61*   Coagulation Profile:  Recent Labs Lab 02/13/16 0930 02/14/16 0528  INR 1.87* 2.04*   Cardiac Enzymes: No results for input(s): CKTOTAL, CKMB, CKMBINDEX, TROPONINI in the last 168 hours. BNP (last 3 results) No results for input(s): PROBNP in the last 8760 hours. HbA1C: No results for input(s): HGBA1C in the last 72 hours. CBG: No results for input(s): GLUCAP in the last 168 hours. Lipid Profile: No results for input(s): CHOL, HDL, LDLCALC, TRIG, CHOLHDL, LDLDIRECT in the last 72 hours. Thyroid Function Tests: No results for input(s): TSH, T4TOTAL, FREET4, T3FREE, THYROIDAB in the last 72 hours. Anemia Panel: No results for input(s): VITAMINB12, FOLATE, FERRITIN, TIBC, IRON, RETICCTPCT in the last 72 hours. Urine analysis:    Component Value Date/Time   COLORURINE ORANGE* 02/13/2016 2016   APPEARANCEUR CLOUDY* 02/13/2016 2016   LABSPEC 1.016 02/13/2016 2016   PHURINE 6.5 02/13/2016 2016   GLUCOSEU NEGATIVE 02/13/2016 2016   HGBUR NEGATIVE 02/13/2016 2016   BILIRUBINUR LARGE* 02/13/2016 2016   KETONESUR 15* 02/13/2016 2016   PROTEINUR NEGATIVE 02/13/2016 2016   UROBILINOGEN 0.2  12/21/2014 1816   NITRITE POSITIVE* 02/13/2016 2016   LEUKOCYTESUR SMALL* 02/13/2016 2016   Sepsis Labs: @LABRCNTIP (procalcitonin:4,lacticidven:4)    Culture, blood (Routine X 2) w Reflex to ID Panel     Status: None (Preliminary result)   Collection Time: 02/13/16  1:25 PM  Result Value Ref Range Status   Specimen Description BLOOD RIGHT HAND  Final   Special Requests BOTTLES DRAWN AEROBIC AND ANAEROBIC 5ML  Final   Culture NO GROWTH < 24 HOURS  Final   Report Status PENDING  Incomplete  Culture, blood (Routine X 2) w Reflex to ID Panel     Status: None (Preliminary result)  Collection Time: 02/13/16  1:25 PM  Result Value Ref Range Status   Specimen Description BLOOD RIGHT HAND  Final   Special Requests BOTTLES DRAWN AEROBIC AND ANAEROBIC 5ML  Final   Culture NO GROWTH < 24 HOURS  Final   Report Status PENDING  Incomplete  MRSA PCR Screening     Status: None   Collection Time: 02/13/16  1:49 PM  Result Value Ref Range Status   MRSA by PCR NEGATIVE NEGATIVE Final      Radiology Studies: Dg Chest 2 View 02/13/2016  Bibasilar atelectasis.   US Abdomen Complete 02/14/2016   Hepatic cirrhosis is noted with splenomegaly and moderate ascites. Hepatofugal flow is noted in the portal vein.    Scheduled Meds: . albumin human  25 g Intravenous Q6H  . cefTRIAXone (ROCEPHIN)  IV  2 g Intravenous Q24H  . folic acid  1 mg Oral Daily  . lactulose  30 g Oral Daily  . phytonadione (VITAMIN K) IV  5 mg Intravenous Daily  . rifaximin  550 mg Oral BID  . sodium chloride flush  3 mL Intravenous Q12H  . thiamine  100 mg Oral Daily   Continuous Infusions:    LOS: 1 day    Time spent: 25 minutes  Greater than 50% of the time spent on counseling and coordinating the care.   Leisa Lenz, MD [facility] Pager [phone]  If 7PM-7AM, please contact night-coverage [email or web address] Password [email or web address] 02/14/2016, 7:52 PM

## 2016-02-14 NOTE — Consult Note (Signed)
[facility] Consult: 8:30 AM 02/14/2016  LOS: 1 day    Referring Provider: Dr Charlies Silvers  Primary Care Physician:  [name], MD Primary Gastroenterologist:  Dr. Carlean Purl    Reason for Consultation:  Mgt of decompensated cirrhosis.    HPI: Allen Schroeder is a 65 y.o. male.  PMH Metastatic (to bone) prostate cancer (dx 2009)s/p orchiectomy. , Xtandi on hold due to liver disease.  Atrial fibrillation.  RVR noted in 08/2015 and s/p succesful DCCV, then started on Pradaxa in place of Plavix.  Pradaxa timed out and Cardiologist did not restart Plavix (as was intended earlier).  CAD, previous stenting.  Anxiety.     Hx ETOH fatty liver evolving to cirrhosis.  Thrombocytopenia.  Chronic anemia, macrocytic on oral B12.  10/2007 Colonoscopy screening and fm hx of parent with colon ca dx'd age 24: non-specific sigmoid erosion, 5 mm descending polyp (tubular adenoma, descending/sigmoid tics and thickened haustral folds (focal reactive lymphoid aggregate, hyperplastic/regenerative changes).    CT scan 11/11/14  Hepatosplenomegaly with geographic fatty sparing or inflammation in the liver, potentially a result of cirrhosis or hepatitis. The degree of narrowing of the hepatic veins and intrahepatic IVC is worsened suggesting acute swelling of the liver. No vascular thrombosis observed. 2. Ascites. 3. Probably reactive adenopathy in the gastrohepatic ligament and porta hepatis. Mesenteric edema noted. 4. There is some mild irregularity of the lumen in the cecum. Cecal mass not readily excluded. In the nonacute setting consider colonoscopy if not recently performed. 5. Scattered sclerotic lesions from prior metastatic disease. Compared to the 2013 CT scan, I do see a new small sclerotic metastatic lesion anteriorly in the  right L2 vertebra. 6. Atherosclerosis, including the coronary arteries  Not sober as of 01/20/16 GI office visit when weight up 40# c/w 11 months prior.  LFTs were worse (t bili 11.6 up from 1.7) and icterus, jaundice noted.  Was on Lasix 20, aldactone 25 and Xifaxan 550 BID.  Hyponatremia limited options for accelerating diuretic dosing.  Underwent 8.9 liter paracentesis on 01/25/16 with fluid WBCs just 27.  Received IV Albumin post tap.  Ultrasound post tap showed cirrhosis, small amount ascites, no liver tumor.  At that point he ceased ETOH.    Yesterday came to ED after 2 week beach vacation c/o chest pressure and dyspnea. Stable, chronic LE edema. Markedly jaundiced, + ascites.  T bili 31.3, AST/ALT 149/59.  Hgb 7.9, was 11. 1 in 10/2015 and 9.1 on 01/27/16.  Platelets 127 (as low as 86 on 08/19/15).  PT/INR 22.9/2.0 (20.6/1.9 on 01/27/16).  BUN/Creat 32/1.6 (improved today to 28/1.3). Na 126.   MELD score of 31.  Diuretics on hold.   After PRBC x 2 Hgb is 9.1.   Cardiac enzymes are not elevated.  BB atx on CXR.    Wt on 6/23: 228 #, 103.5 kg.  Currently: 201#/91.6 kg.  Pt compliant with diuretics and Rifaximin, but is not taking Lactulose.  Brown BMs every 3 to 4 days.  Tired and mostly sits around but not sleeping much during day.  Was driving to work up until 3 weeks ago when he left for the beach.  At present he feels like he's gonna need a stay and rehab/SNF as it is harder and harder for him to get around.  Uses cane/walker: poor balance but no recent falls.  Appetite diminished due to increasing abd girth and early satiety but no nausea.  His "chest pressure" sounds more like sense of fullness and pressure from the abdomen.  No NSAIDs.  No scrotal or LE edema.      Past Medical History  Diagnosis Date  . Atrial fibrillation ([place])   . Other and unspecified hyperlipidemia   . Unspecified essential hypertension   . Elevated prostate specific antigen (PSA)   . Personal history of colonic  polyps   . Hypocalcemia   . Acute alcoholic hepatitis   . Essential and other specified forms of tremor   . Anxiety state, unspecified   . Family history of malignant neoplasm of gastrointestinal tract   . Alcohol abuse with physiological dependence ([place]) 02/13/2012  . 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 Alcoholic hepatitis with ascites 11/13/2014  . Bacteremia, escherichia coli   . Coronary artery disease     s/p PCI to the LAD in 2011 with a BMS  . Dysrhythmia   . Pneumonia     childhood  . Depression   . Malignant neoplasm of prostate ([place]) 11/2007    Mets to Bone  . Hepatorenal syndrome ([place]) 02/13/2016  . Symptomatic anemia 02/13/2016    Past Surgical History  Procedure Laterality Date  . Vasectomy    . Cholecystectomy    . Coronary stent placement  2011  . Colonoscopy w/ biopsies    . Cardiac catheterization    . Tee without cardioversion N/A 08/18/2015    Procedure: TRANSESOPHAGEAL ECHOCARDIOGRAM (TEE);  Surgeon: Thayer Headings, MD;  Location: [facility];  Service: Cardiovascular;  Laterality: N/A;  . Cardioversion N/A 08/18/2015    Procedure: CARDIOVERSION;  Surgeon: Thayer Headings, MD;  Location: [facility] ENDOSCOPY;  Service: Cardiovascular;  Laterality: N/A;    Prior to Admission medications   Medication Sig Start Date End Date Taking? Authorizing Provider  Calcium Carb-Cholecalciferol (CALCIUM + D3 PO) Take 1 tablet by mouth 2 (two) times daily.   Yes Historical Provider, MD  [facility] ASPIRIN LOW DOSE 81 MG EC tablet TAKE 1 TABLET BY MOUTH EVERY DAY Patient taking differently: TAKE 1 TABLET BY MOUTH EVERY morning 09/20/15  Yes Sherren Mocha, MD  folic acid (FOLVITE) 1 MG tablet Take 1 mg by mouth daily. 07/16/15  Yes Historical Provider, MD  furosemide (LASIX) 20 MG tablet Take 20 mg by mouth daily.   Yes Historical Provider, MD  Multiple Vitamin (MULTIVITAMIN WITH MINERALS) TABS tablet  Take 1 tablet by mouth daily with breakfast.   Yes Historical Provider, MD  oxyCODONE (OXY IR/ROXICODONE) 5 MG immediate release tablet Take 5-10 mg by mouth 2 (two) times daily as needed for severe pain.    Yes Historical Provider, MD  oxymetazoline (AFRIN) 0.05 % nasal spray Place 1 spray into both nostrils at bedtime as needed for congestion.    Yes Historical Provider, MD  rifaximin (XIFAXAN) 550 MG TABS tablet Take 1 tablet (550 mg total) by mouth 2 (two) times daily. 12/29/14  Yes Modena Jansky, MD  sotalol (BETAPACE) 80 MG tablet Take 1 tablet (80 mg total) by mouth 2 (  two) times daily. 02/10/16  Yes Lendon Colonel, NP  spironolactone (ALDACTONE) 25 MG tablet Take 25 mg by mouth daily.   Yes Historical Provider, MD  sulfamethoxazole-trimethoprim (BACTRIM DS,SEPTRA DS) 800-160 MG tablet Take 1 tablet by mouth daily.   Yes Historical Provider, MD  thiamine 100 MG tablet Take 100 mg by mouth daily.   Yes Historical Provider, MD  Vitamins-Lipotropics ([employer]S BALANCED B-100 PO) Take 1 tablet by mouth daily.   Yes Historical Provider, MD  XTANDI 40 MG capsule TAKE 4 CAPSULES BY MOUTH DAILY 10/04/15  Yes Wyatt Portela, MD    Scheduled Meds: . cefTRIAXone (ROCEPHIN)  IV  2 g Intravenous Q24H  . folic acid  1 mg Intravenous Daily  . rifaximin  550 mg Oral BID  . sodium chloride flush  3 mL Intravenous Q12H  . thiamine  100 mg Intravenous Daily   Infusions:   PRN Meds: morphine injection, oxyCODONE, oxymetazoline   Allergies as of 02/13/2016  . (No Known Allergies)    Family History  Problem Relation Age of Onset  . Prostate cancer Father   . Coronary artery disease Father   . Colon cancer Father   . Aneurysm Mother   . Prostate cancer Brother     Social History   Social History  . Marital Status: Married    Spouse Name: N/A  . Number of Children: 3  . Years of Education: N/A   Occupational History  . lawyer     [employer] of [employer] History Main Topics    . Smoking status: Former Smoker    Types: Cigarettes  . Smokeless tobacco: Never Used     Comment: stopped in college  . Alcohol Use: 1.2 - 1.8 oz/week    2-3 Shots of liquor per week     Comment: has consumed as much as a 1/2 gallon of vodka in a day, now drinks at least 3-5 drinks per day.   . Drug Use: No  . Sexual Activity: No   Other Topics Concern  . Not on file   Social History Narrative   Patient is married. 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 He has two sons, one daughter and 5 grandchildren. Both sons work in the business. Daughter lives in [place]. Close knit family.    11/18/2014       REVIEW OF SYSTEMS: Constitutional:  Weakness.  Before going to beach for July 4th holiday, was driving to his office.  ENT:  No nose bleeds Pulm:  DOE, no cough CV:  No palpitations, no LE edema.  No angina GU:  Urine is very dark. No hematuria, no frequency GI:  Per HPI Heme:  No unusual bleeding or bruising   Transfusions:  None before the last 18 hours.  Neuro:  No headaches.  No seizures.  + LE numbness.  + tremor in UEs.  MS:  Pain in lower back, hips, knees Derm:  No itching, no rash or sores.  Endocrine:  No sweats or chills.  No polyuria or dysuria Immunization:  Not queried.  Travel:  Riley Nearing is to beach in last few weeks.    PHYSICAL EXAM: Vital signs in last 24 hours: Filed Vitals:   02/14/16 0300 02/14/16 0432  BP: 85/51 96/53  Pulse: 51 62  Temp:  98.2 F (  36.8 C)  Resp: 13 16   Wt Readings from Last 3 Encounters:  02/13/16 91.6 kg (201 lb 15.1 oz)  01/20/16 103.59 kg (228 lb 6 oz)  01/18/16 104.917 kg (231 lb 4.8 oz)    General: jaundiced, comfortable.  Alert.   Head:  No asymmetry, swelling or signs trauma  Eyes:  + icterus, no conj pallor Ears:  Hearing intact  Nose:  No congestion or  discharge Mouth:  Tongue midline and moist MM Neck:  No mass, no JVD, no TMG Lungs:  Clear bil.  No cough, no dyspnea Heart: RRR.  NSR on tele.  No MRG Abdomen:  Soft but protuberant, NT.  Active BS.  Small umbilical hernia.  .   Rectal: deferred   Musc/Skeltl: no joint erythema, swelling or gross deformities Extremities:  No LE edema  Neurologic:  Oriented x 3.  Moderately slow but accurate response time. No tremor or asterixis.  Skin:  Jaundice, no telangectasis Tattoos:  none   Psych:  Pleasant, calm.  Cooperative.  Not currently anxious or agitated.   Intake/Output from previous day: 07/09 0701 - 07/10 0700 In: 1180 [P.O.:420; I.V.:10; Blood:700; IV Piggyback:50] Out: 850 [Urine:850] Intake/Output this shift:    LAB RESULTS:  Recent Labs  02/13/16 0930 02/14/16 0528  WBC 11.3* 10.3  HGB 7.9* 9.4*  HCT 23.0* 26.4*  PLT 157 127*  MCV      122             BMET Lab Results  Component Value Date   NA 128* 02/14/2016   NA 126* 02/13/2016   NA 124* 01/18/2016   K 3.1* 02/14/2016   K 3.2* 02/13/2016   K 3.8 01/18/2016   CL 90* 02/14/2016   CL 87* 02/13/2016   CL 102 08/27/2015   CO2 29 02/14/2016   CO2 30 02/13/2016   CO2 25 01/18/2016   GLUCOSE 84 02/14/2016   GLUCOSE 88 02/13/2016   GLUCOSE 88 01/18/2016   BUN 28* 02/14/2016   BUN 32* 02/13/2016   BUN 7.3 01/18/2016   CREATININE 1.39* 02/14/2016   CREATININE 1.67* 02/13/2016   CREATININE 0.8 01/18/2016   CALCIUM 7.5* 02/14/2016   CALCIUM 7.7* 02/13/2016   CALCIUM 7.5* 01/18/2016   LFT  Recent Labs  02/13/16 0930 02/14/16 0528  PROT 7.2 6.7  ALBUMIN 1.9* 1.9*  AST 149* 140*  ALT 59 58  ALKPHOS 220* 207*  BILITOT 31.0* 31.3*  BILIDIR 17.5*  --   IBILI 13.5*  --    PT/INR Lab Results  Component Value Date   INR 2.04* 02/14/2016   INR 1.87* 02/13/2016   INR 1.9* 01/27/2016   PROTIME 20.8 01/21/2009   PROTIME 19.3 01/18/2009   Hepatitis Panel No results for input(s): HEPBSAG, HCVAB,  HEPAIGM, HEPBIGM in the last 72 hours. C-Diff No components found for: CDIFF Lipase     Component Value Date/Time   LIPASE 43 10/08/2010 0059    Drugs of Abuse     Component Value Date/Time   LABOPIA NONE DETECTED 12/01/2007 1944   COCAINSCRNUR NONE DETECTED 12/01/2007 1944   LABBENZ NONE DETECTED 12/01/2007 1944   AMPHETMU NONE DETECTED 12/01/2007 1944   THCU NONE DETECTED 12/01/2007 1944   LABBARB  12/01/2007 1944    NONE DETECTED        DRUG SCREEN FOR MEDICAL PURPOSES ONLY.  IF CONFIRMATION IS NEEDED FOR ANY PURPOSE, NOTIFY LAB WITHIN 5 DAYS.     RADIOLOGY STUDIES: Dg Chest 2 View  02/13/2016  CLINICAL DATA:  Short of breath EXAM: CHEST  2 VIEW COMPARISON:  12/21/2014 FINDINGS: Normal heart size. Low volumes. Bibasilar atelectasis. No pneumothorax or pleural effusion. IMPRESSION: Bibasilar atelectasis. Electronically Signed   By: Marybelle Killings M.D.   On: 02/13/2016 10:33    ENDOSCOPIC STUDIES: See HPI  IMPRESSION:   *  Cirrhosis of liver due to ETOH.  AFP level normal 6/22 and no suggestion of tumor on 6/20 ultrasound.  Worsening  Jaundice.  MELD score 31 ETOH abstinence for about 2 to 3 weeks.    *  AKI, improved in last 24 hours.  ? Hepatorenal syndrome?  Diuretics on hold  *  Coagulopathy.  S/p Vit K 5 mg SQ.  PT/INR worsening.      *  Hyponatremia.  Has not and will not allow for titration of diuretics dose.    *  Ascites.  8.9 liter tap 6/20: no SBP.  Day 2 empiric Rocephin.   *  SOB, chest pressure.  Cardiac enzymes not elevated.   *  Macrocytic anemia.  On Thiamine and Folic acid.    PLAN:     *  Will probably need another paracentesis and post tap albumin.  I see tap (but not albumin) is ordered.  Wonder if we should allow another 24 hours, and hopefully more renal and coag improvement before pursuing?  Asked RN to consult with hospitalist. OK to eat if ultrasound delayed or post ultrasound/paracentesis.   *  FOBT test stool.    *  Note pt has  7/21 GI OV with Dr Carlean Purl.   *  Add Lactulose.  Though not overtly encephalopathic, he is having infrequent BMs and Ammonia is 61.   *  Vit K IV.    *  Per d/w Dr Loletha Carrow: ordered Albumin 25% soln, 25 gm q 6 x 4 doses.   Azucena Freed  02/14/2016, 8:30 AM Pager: [phone]

## 2016-02-15 LAB — COMPREHENSIVE METABOLIC PANEL
ALT: 53 U/L (ref 17–63)
AST: 123 U/L — ABNORMAL HIGH (ref 15–41)
Albumin: 2.6 g/dL — ABNORMAL LOW (ref 3.5–5.0)
Alkaline Phosphatase: 191 U/L — ABNORMAL HIGH (ref 38–126)
Anion gap: 8 (ref 5–15)
BUN: 22 mg/dL — ABNORMAL HIGH (ref 6–20)
CHLORIDE: 91 mmol/L — AB (ref 101–111)
CO2: 28 mmol/L (ref 22–32)
CREATININE: 1.15 mg/dL (ref 0.61–1.24)
Calcium: 7.6 mg/dL — ABNORMAL LOW (ref 8.9–10.3)
GFR calc non Af Amer: >60 mL/min (ref >60)
Glucose, Bld: 90 mg/dL (ref 65–99)
POTASSIUM: 3.3 mmol/L — AB (ref 3.5–5.1)
SODIUM: 127 mmol/L — AB (ref 135–145)
Total Bilirubin: 31.1 mg/dL (ref 0.3–1.2)
Total Protein: 7.1 g/dL (ref 6.5–8.1)

## 2016-02-15 LAB — PROTIME-INR
INR: 1.83 — AB (ref 0.00–1.49)
Prothrombin Time: 21.1 seconds — ABNORMAL HIGH (ref 11.6–15.2)

## 2016-02-15 MED ORDER — POTASSIUM CHLORIDE CRYS ER 20 MEQ PO TBCR
40.0000 meq | EXTENDED_RELEASE_TABLET | Freq: Once | ORAL | Status: AC
  Administered 2016-02-15: 40 meq via ORAL
  Filled 2016-02-15: qty 2

## 2016-02-15 NOTE — Progress Notes (Signed)
Daily Rounding Note  02/15/2016, 8:47 AM  LOS: 2 days   SUBJECTIVE:       Feels pretty good.  No complaints.  Tolerating solid diet but has early satiety and diminished intake.  Had brown stool last PM.    OBJECTIVE:         Vital signs in last 24 hours:    Temp:  [98 F (36.7 C)-98.7 F (37.1 C)] 98.3 F (36.8 C) (07/11 0721) Pulse Rate:  [54-70] 66 (07/11 0800) Resp:  [13-18] 13 (07/11 0800) BP: (89-103)/(56-65) 95/65 mmHg (07/11 0721) SpO2:  [95 %-98 %] 97 % (07/11 0800) Weight:  [93.6 kg (206 lb 5.6 oz)] 93.6 kg (206 lb 5.6 oz) (07/11 0618) Last BM Date: 02/14/16 Filed Weights   02/13/16 1337 02/15/16 0618  Weight: 91.6 kg (201 lb 15.1 oz) 93.6 kg (206 lb 5.6 oz)   General: icteric and jaundice, alert, comfortable   Heart: RRR Chest: clear bil.  No dyspnea, no cough Abdomen: protuberant, moderately tense.  Active BS. Not tender.  No penile/scrotal edema Extremities: no CCE.   Neuro/Psych:  Oriented x 3. No asterixis or tremor.  Fully alert and appropriate.   Intake/Output from previous day: 07/10 0701 - 07/11 0700 In: 760 [P.O.:360; IV Piggyback:400] Out: 1800 [Urine:1800]  Intake/Output this shift: Total I/O In: 150 [IV Piggyback:150] Out: -   Lab Results:  Recent Labs  02/13/16 0930 02/14/16 0528  WBC 11.3* 10.3  HGB 7.9* 9.4*  HCT 23.0* 26.4*  PLT 157 127*   BMET  Recent Labs  02/13/16 0930 02/14/16 0528 02/15/16 0313  NA 126* 128* 127*  K 3.2* 3.1* 3.3*  CL 87* 90* 91*  CO2 _0 GLUCOSE 88 84 90  BUN 32* 28* 22*  CREATININE 1.67* 1.39* 1.15  CALCIUM 7.7* 7.5* 7.6*   LFT  Recent Labs  02/13/16 0930 02/14/16 0528 02/15/16 0313  PROT 7.2 6.7 7.1  ALBUMIN 1.9* 1.9* 2.6*  AST 149* 140* 123*  ALT 59 58 53  ALKPHOS 220* 207* 191*  BILITOT 31.0* 31.3* 31.1*  BILIDIR 17.5*  --   --   IBILI 13.5*  --   --    PT/INR  Recent Labs  02/14/16 0528 02/15/16 0313    LABPROT 22.9* 21.1*  INR 2.04* 1.83*   Hepatitis Panel No results for input(s): HEPBSAG, HCVAB, HEPAIGM, HEPBIGM in the last 72 hours.  Studies/Results: Dg Chest 2 View  02/13/2016  CLINICAL DATA:  Short of breath EXAM: CHEST  2 VIEW COMPARISON:  12/21/2014 FINDINGS: Normal heart size. Low volumes. Bibasilar atelectasis. No pneumothorax or pleural effusion. IMPRESSION: Bibasilar atelectasis. Electronically Signed   By: Marybelle Killings M.D.   On: 02/13/2016 10:33   US Abdomen Complete  02/14/2016  CLINICAL DATA:  Alcoholic cirrhosis. EXAM: ABDOMEN ULTRASOUND COMPLETE COMPARISON:  Ultrasound of January 25, 2016. FINDINGS: Gallbladder: Status post cholecystectomy. Common bile duct: Diameter: 5 mm which is within normal limits. Liver: No focal lesion identified. Increased echogenicity is noted with slightly nodular margins consistent with hepatic cirrhosis. Hepatofugal flow is noted in the portal vein. IVC: No abnormality visualized. Pancreas: Visualized portion unremarkable. Spleen: Maximum measured length of 21 cm with calculated volume of 1500 cubic cm consistent with splenomegaly. This is slightly enlarged compared to prior exam. Right Kidney: Length: 12.8 cm. 5.2 cm cyst is noted. Echogenicity within normal limits. No mass or hydronephrosis visualized. Left Kidney: Length: 11.6 cm. Echogenicity within normal limits. No  mass or hydronephrosis visualized. Abdominal aorta: 3.1 cm abdominal aortic aneurysm is noted. Other findings: Moderate ascites is noted. IMPRESSION: Hepatic cirrhosis is noted with splenomegaly and moderate ascites. Hepatofugal flow is noted in the portal vein. Electronically Signed   By: James  Green Jr, M.D.   On: 02/14/2016 15:18   Scheduled Meds: . cefTRIAXone (ROCEPHIN)  IV  2 g Intravenous Q24H  . folic acid  1 mg Oral Daily  . lactulose  30 g Oral Daily  . rifaximin  550 mg Oral BID  . sodium chloride flush  3 mL Intravenous Q12H  . thiamine  100 mg Oral Daily   Continuous  Infusions:  PRN Meds:.morphine injection, oxyCODONE, oxymetazoline   ASSESMENT:   * Cirrhosis of liver due to ETOH. AFP level normal 6/22 and no suggestion of tumor on 6/20 ultrasound.  Jaundice, no drop in T bili but alk phos, transaminases improved. MELD score 31 at arrival.  ETOH abstinence began late 01/2016.    * AKI, improving. ? Hepatorenal syndrome? Diuretics on hold.  Albumin doses x 4 initiated 7/10 in PM.    * Coagulopathy. S/p Vit K SQ, then IV. PT/INR trending improved. .   * Hyponatremia.    * Ascites. 8.9 liter tap 6/20: no SBP. Day 3 empiric Rocephin.  Moderate ascites, increased splenomegaly per 7/10 ultrasound.  Paracentesis delayed due to AKI, coagulopathy.    * SOB, chest pressure. Cardiac enzymes not elevated.   * Macrocytic anemia. On Thiamine and Folic acid.    *  Prostate cancer.  PSA <0.1 on 01/18/16.   *  FOBT +.    Constipation generally.  Daily Lactulose reinitiated 7/10.  Was on this in past and Rifaximin for his HE.  10/2007 Colonoscopy: tubular adenoma, thickened haustral folds.  Has never had EGD.     PLAN   *  Paracentesis, timing to be d/w Danis.    *  ? Pursue EGD to screen for portal htn, varices?     Sarah Gribbin  02/15/2016, 8:47 AM Pager: [phone] 

## 2016-02-15 NOTE — Progress Notes (Signed)
Patient ID: Allen Schroeder, male   DOB: Apr 14, 1951, 65 y.o.   MRN: [id number]  PROGRESS NOTE    Allen Schroeder  [id number] DOB: April 10, 1951 DOA: 02/13/2016  PCP: [name], MD   Brief Narrative:  65 y.o. Male with past medical history of metastatic prostate cancer (dx 2009) s/p orchiectomy, bone metastases , Xtandi on hold due to liver disease, alcohol abuse, atrial fibrillation with RVR noted in 08/2015 and s/p succesful DCCV, then started on Pradaxa in place of Plavix.Cardiologist did not restart Plavix and pt now on sotalol and aspirin. Underwent 8.9 liter paracentesis on 01/25/16 with fluid WBCs just 27. Received IV Albumin post tap. Ultrasound post tap showed cirrhosis, small amount ascites, no liver tumor. At that point he ceased ETOH.He presented to [facility] ED with reports of chest pressure and dyspnea at rest and with exertion. Blood work showed INR 1.87, WBC 11.3, hgb 7.9, platelets 157. Ammonia was 19. AST 149, normal ALT, ALP 220, T bili 31 with DB 17.5, albumin 1.9. Pt was seen by GI in consultation.   Assessment & Plan:   Principal Problem:   Decompensation of cirrhosis of liver (HCC) / Alcoholic cirrhosis of liver with ascites (HCC) / Abnormal LFT's / Obstructive jaundice / Leukocytosis  - Decompensated cirrhosis with marked hyperbilirubinemia, large volume ascites, coagulopathy, mild AKI and mild HE - GI did not recommend paracentesis this AM since pt is not in respiratory distress and his renal function is decreased. IV albumin given instead of crystalloid fluids (albumin given 7/10 and 7/11) - Unfortunately, he is not a liver transplant candidate due to recent alcohol use. - Continue rocephin for possible SBP - Monitor Cr, INR and bilirubin - Continue lactulose and rifaximin  Active Problems:   Alcohol abuse - Continue folic acid, thiamine    Coagulopathy - Due to liver failure - Vitamin K given 5 mg IV 7/10    Atrial fibrillation (HCC) - CHADS vasc score 2 - INR  1.83, pt coagulopathic from liver failure  - HR controlled without beta blocker     Anemia of chronic disease / Thrombocytopenia  - Due to bone marrow suppression from liver failure - Hemoglobin 7.9 --> 9.4 - Platelets 127, overall stable     Hypokalemia - Due to poor PO intake - Continue to supplement     Ascites / Hyponatremia  - Secondary to liver cirrhosis - No need for paracentesis as no resp distress - Sodium 127    Hepatorenal syndrome (HCC) - Cr 1.39 on admission and has normalized this am    DVT prophylaxis: SCD's bilaterally  Code Status: DNR/DNI Family Communication: no family at the bedside this am  Disposition Plan: remains in SDU due to hypotension    Consultants:   GI, Dr. Wilfrid Lund   Procedures:   None   Antimicrobials:   Rocephin 02/13/2016 -->    Subjective: No overnight events. No respiratory distress.   Objective: Filed Vitals:   02/14/16 2305 02/15/16 0255 02/15/16 0618 02/15/16 0721  BP: 103/64 90/62  95/65  Pulse: 66 68  70  Temp: 98 F (36.7 C) 98.7 F (37.1 C)    TempSrc: Oral Oral    Resp: 15 16  16   Height:      Weight:   93.6 kg (206 lb 5.6 oz)   SpO2: 96% 96%  95%    Intake/Output Summary (Last 24 hours) at 02/15/16 0810 Last data filed at 02/15/16 [id number]  Gross per 24 hour  Intake  760 ml  Output   1775 ml  Net  -1015 ml   Filed Weights   02/13/16 1337 02/15/16 0618  Weight: 91.6 kg (201 lb 15.1 oz) 93.6 kg (206 lb 5.6 oz)    Examination:  General exam: no acute distress  Respiratory system: No wheezing, no rhonchi  Cardiovascular system: S1 & S2 appreciated, rate controlled  Gastrointestinal system: (+) BS, distended but not tender   Central nervous system: NO focal deficits . Extremities: No swelling, palpable pulses  Skin: Skin is warm and dry, icteric  Psychiatry: Normal mood and behavior, oriented to time, place and person   Data Reviewed: I have personally reviewed following labs and imaging  studies  CBC:  Recent Labs Lab 02/13/16 0930 02/14/16 0528  WBC 11.3* 10.3  NEUTROABS 9.4*  --   HGB 7.9* 9.4*  HCT 23.0* 26.4*  MCV 122.3* 110.3*  PLT 157 [id number]*   Basic Metabolic Panel:  Recent Labs Lab 02/13/16 0930 02/13/16 1415 02/14/16 0528 02/15/16 0313  NA 126*  --  128* 127*  K 3.2*  --  3.1* 3.3*  CL 87*  --  90* 91*  CO2 30  --  29 28  GLUCOSE 88  --  84 90  BUN 32*  --  28* 22*  CREATININE 1.67*  --  1.39* 1.15  CALCIUM 7.7*  --  7.5* 7.6*  MG  --  2.5*  --   --   PHOS  --  4.7*  --   --    GFR: Estimated Creatinine Clearance: 72.5 mL/min (by C-G formula based on Cr of 1.15). Liver Function Tests:  Recent Labs Lab 02/13/16 0930 02/14/16 0528 02/15/16 0313  AST 149* 140* 123*  ALT 59 58 53  ALKPHOS 220* 207* 191*  BILITOT 31.0* 31.3* 31.1*  PROT 7.2 6.7 7.1  ALBUMIN 1.9* 1.9* 2.6*   No results for input(s): LIPASE, AMYLASE in the last 168 hours.  Recent Labs Lab 02/13/16 0936 02/14/16 0528  AMMONIA 19 61*   Coagulation Profile:  Recent Labs Lab 02/13/16 0930 02/14/16 0528 02/15/16 0313  INR 1.87* 2.04* 1.83*   Cardiac Enzymes: No results for input(s): CKTOTAL, CKMB, CKMBINDEX, TROPONINI in the last 168 hours. BNP (last 3 results) No results for input(s): PROBNP in the last 8760 hours. HbA1C: No results for input(s): HGBA1C in the last 72 hours. CBG: No results for input(s): GLUCAP in the last 168 hours. Lipid Profile: No results for input(s): CHOL, HDL, LDLCALC, TRIG, CHOLHDL, LDLDIRECT in the last 72 hours. Thyroid Function Tests: No results for input(s): TSH, T4TOTAL, FREET4, T3FREE, THYROIDAB in the last 72 hours. Anemia Panel: No results for input(s): VITAMINB12, FOLATE, FERRITIN, TIBC, IRON, RETICCTPCT in the last 72 hours. Urine analysis:    Component Value Date/Time   COLORURINE ORANGE* 02/13/2016 2016   APPEARANCEUR CLOUDY* 02/13/2016 2016   LABSPEC 1.016 02/13/2016 2016   PHURINE 6.5 02/13/2016 2016   GLUCOSEU  NEGATIVE 02/13/2016 2016   HGBUR NEGATIVE 02/13/2016 2016   BILIRUBINUR LARGE* 02/13/2016 2016   KETONESUR 15* 02/13/2016 2016   PROTEINUR NEGATIVE 02/13/2016 2016   UROBILINOGEN 0.2 12/21/2014 1816   NITRITE POSITIVE* 02/13/2016 2016   LEUKOCYTESUR SMALL* 02/13/2016 2016   Sepsis Labs: @LABRCNTIP (procalcitonin:4,lacticidven:4)    Culture, blood (Routine X 2) w Reflex to ID Panel     Status: None (Preliminary result)   Collection Time: 02/13/16  1:25 PM  Result Value Ref Range Status   Specimen Description BLOOD RIGHT HAND  Final  Special Requests BOTTLES DRAWN AEROBIC AND ANAEROBIC 5ML  Final   Culture NO GROWTH < 24 HOURS  Final   Report Status PENDING  Incomplete  Culture, blood (Routine X 2) w Reflex to ID Panel     Status: None (Preliminary result)   Collection Time: 02/13/16  1:25 PM  Result Value Ref Range Status   Specimen Description BLOOD RIGHT HAND  Final   Special Requests BOTTLES DRAWN AEROBIC AND ANAEROBIC 5ML  Final   Culture NO GROWTH < 24 HOURS  Final   Report Status PENDING  Incomplete  MRSA PCR Screening     Status: None   Collection Time: 02/13/16  1:49 PM  Result Value Ref Range Status   MRSA by PCR NEGATIVE NEGATIVE Final      Radiology Studies: Dg Chest 2 View 02/13/2016  Bibasilar atelectasis.   US Abdomen Complete 02/14/2016   Hepatic cirrhosis is noted with splenomegaly and moderate ascites. Hepatofugal flow is noted in the portal vein.    Scheduled Meds: . albumin human  25 g Intravenous Q6H  . cefTRIAXone (ROCEPHIN)  IV  2 g Intravenous Q24H  . folic acid  1 mg Oral Daily  . lactulose  30 g Oral Daily  . phytonadione (VITAMIN K) IV  5 mg Intravenous Daily  . potassium chloride  40 mEq Oral Once  . rifaximin  550 mg Oral BID  . sodium chloride flush  3 mL Intravenous Q12H  . thiamine  100 mg Oral Daily   Continuous Infusions:    LOS: 2 days    Time spent: 25 minutes  Greater than 50% of the time spent on counseling and coordinating  the care.   Leisa Lenz, MD [facility] Pager [phone]  If 7PM-7AM, please contact night-coverage [email or web address] Password [email or web address] 02/15/2016, 8:10 AM

## 2016-02-15 NOTE — Clinical Social Work Note (Signed)
CSW met with patient. He came to hospital from home (was on vacation prior to admission). He has paid money for program with [facility] but they do not at this time have beds available. Involved with program connected to them in [place]. No PT recommendations right now. CSW will continue to follow.  Allen Schroeder, [place]

## 2016-02-15 NOTE — Progress Notes (Signed)
Notified doctor of positive occult blood

## 2016-02-16 ENCOUNTER — Inpatient Hospital Stay (HOSPITAL_COMMUNITY): Payer: Medicare Other

## 2016-02-16 LAB — BASIC METABOLIC PANEL
ANION GAP: 8 (ref 5–15)
BUN: 18 mg/dL (ref 6–20)
CALCIUM: 7.7 mg/dL — AB (ref 8.9–10.3)
CHLORIDE: 91 mmol/L — AB (ref 101–111)
CO2: 26 mmol/L (ref 22–32)
Creatinine, Ser: 1.02 mg/dL (ref 0.61–1.24)
GFR calc non Af Amer: >60 mL/min (ref >60)
Glucose, Bld: 98 mg/dL (ref 65–99)
POTASSIUM: 3.7 mmol/L (ref 3.5–5.1)
Sodium: 125 mmol/L — ABNORMAL LOW (ref 135–145)

## 2016-02-16 LAB — CBC
HEMATOCRIT: 23.9 % — AB (ref 39.0–52.0)
HEMOGLOBIN: 8.3 g/dL — AB (ref 13.0–17.0)
MCH: 39.2 pg — ABNORMAL HIGH (ref 26.0–34.0)
MCHC: 34.7 g/dL (ref 30.0–36.0)
MCV: 112.7 fL — ABNORMAL HIGH (ref 78.0–100.0)
Platelets: 117 10*3/uL — ABNORMAL LOW (ref 150–400)
RBC: 2.12 MIL/uL — AB (ref 4.22–5.81)
RDW: 27.1 % — ABNORMAL HIGH (ref 11.5–15.5)
WBC: 10.1 10*3/uL (ref 4.0–10.5)

## 2016-02-16 LAB — HEPATIC FUNCTION PANEL
ALT: 54 U/L (ref 17–63)
AST: 115 U/L — AB (ref 15–41)
Albumin: 2.5 g/dL — ABNORMAL LOW (ref 3.5–5.0)
Alkaline Phosphatase: 177 U/L — ABNORMAL HIGH (ref 38–126)
BILIRUBIN INDIRECT: 13.3 mg/dL — AB (ref 0.3–0.9)
Bilirubin, Direct: 19 mg/dL — ABNORMAL HIGH (ref 0.1–0.5)
TOTAL PROTEIN: 6.7 g/dL (ref 6.5–8.1)
Total Bilirubin: 32.3 mg/dL (ref 0.3–1.2)

## 2016-02-16 LAB — BODY FLUID CELL COUNT WITH DIFFERENTIAL
Lymphs, Fluid: 68 %
MONOCYTE-MACROPHAGE-SEROUS FLUID: 22 % — AB (ref 50–90)
Neutrophil Count, Fluid: 10 % (ref 0–25)
WBC FLUID: 73 uL (ref 0–1000)

## 2016-02-16 LAB — PROTIME-INR
INR: 1.81 — AB (ref 0.00–1.49)
PROTHROMBIN TIME: 20.9 s — AB (ref 11.6–15.2)

## 2016-02-16 MED ORDER — ALBUMIN HUMAN 25 % IV SOLN
50.0000 g | Freq: Once | INTRAVENOUS | Status: AC
  Administered 2016-02-16: 50 g via INTRAVENOUS
  Filled 2016-02-16: qty 50

## 2016-02-16 MED ORDER — LIDOCAINE HCL (PF) 1 % IJ SOLN
INTRAMUSCULAR | Status: AC
  Filled 2016-02-16: qty 10

## 2016-02-16 NOTE — Progress Notes (Signed)
Daily Rounding Note  02/16/2016, 8:29 AM  LOS: 3 days   SUBJECTIVE:       No complaints, just the sense of abd bloating.  Small to medium sized BM yesterday.  No dyspnea.  Note BPs are soft but pt not feeling dizzy.  He likes having condom catheter in place as it resolves his usual problem of 6 or so episodes nocturia every night.   OBJECTIVE:         Vital signs in last 24 hours:    Temp:  [98 F (36.7 C)-98.7 F (37.1 C)] 98.7 F (37.1 C) (07/12 0700) Pulse Rate:  [62-74] 66 (07/12 0700) Resp:  [13-18] 18 (07/12 0700) BP: (90-104)/(54-71) 93/56 mmHg (07/12 0710) SpO2:  [97 %-100 %] 100 % (07/12 0700) Weight:  [97.9 kg (215 lb 13.3 oz)] 97.9 kg (215 lb 13.3 oz) (07/12 0254) Last BM Date: 02/14/16 Filed Weights   02/13/16 1337 02/15/16 0618 02/16/16 0254  Weight: 91.6 kg (201 lb 15.1 oz) 93.6 kg (206 lb 5.6 oz) 97.9 kg (215 lb 13.3 oz)   General: other than jaundice, he looks well.    Heart: RRR Chest: clear bil.  No dyspnea or cough.  Abdomen: soft, active BS.  Protuberant  Extremities: no CCE.   Neuro/Psych:  Pleasant, cooperative, calm, no asterixis or tremor.    Intake/Output from previous day: 07/11 0701 - 07/12 0700 In: 443 [P.O.:240; I.V.:3; IV Piggyback:200] Out: 925 [Urine:925]  Intake/Output this shift:    Lab Results:  Recent Labs  02/13/16 0930 02/14/16 0528 02/16/16 0418  WBC 11.3* 10.3 10.1  HGB 7.9* 9.4* 8.3*  HCT 23.0* 26.4* 23.9*  PLT 157 127* 117*   BMET  Recent Labs  02/14/16 0528 02/15/16 0313 02/16/16 0418  NA 128* 127* 125*  K 3.1* 3.3* 3.7  CL 90* 91* 91*  CO2 _0 GLUCOSE 84 90 98  BUN 28* 22* 18  CREATININE 1.39* 1.15 1.02  CALCIUM 7.5* 7.6* 7.7*   LFT  Recent Labs  02/13/16 0930 02/14/16 0528 02/15/16 0313 02/16/16 0418  PROT 7.2 6.7 7.1 6.7  ALBUMIN 1.9* 1.9* 2.6* 2.5*  AST 149* 140* 123* 115*  ALT 59 58 53 54  ALKPHOS 220* 207* 191* 177*    BILITOT 31.0* 31.3* 31.1* 32.3*  BILIDIR 17.5*  --   --  19.0*  IBILI 13.5*  --   --  13.3*   PT/INR  Recent Labs  02/15/16 0313 02/16/16 0418  LABPROT 21.1* 20.9*  INR 1.83* 1.81*   Hepatitis Panel No results for input(s): HEPBSAG, HCVAB, HEPAIGM, HEPBIGM in the last 72 hours.  Studies/Results: US Abdomen Complete  02/14/2016  CLINICAL DATA:  Alcoholic cirrhosis. EXAM: ABDOMEN ULTRASOUND COMPLETE COMPARISON:  Ultrasound of January 25, 2016. FINDINGS: Gallbladder: Status post cholecystectomy. Common bile duct: Diameter: 5 mm which is within normal limits. Liver: No focal lesion identified. Increased echogenicity is noted with slightly nodular margins consistent with hepatic cirrhosis. Hepatofugal flow is noted in the portal vein. IVC: No abnormality visualized. Pancreas: Visualized portion unremarkable. Spleen: Maximum measured length of 21 cm with calculated volume of 1500 cubic cm consistent with splenomegaly. This is slightly enlarged compared to prior exam. Right Kidney: Length: 12.8 cm. 5.2 cm cyst is noted. Echogenicity within normal limits. No mass or hydronephrosis visualized. Left Kidney: Length: 11.6 cm. Echogenicity within normal limits. No mass or hydronephrosis visualized. Abdominal aorta: 3.1 cm abdominal aortic aneurysm is noted. Other findings: Moderate  ascites is noted. IMPRESSION: Hepatic cirrhosis is noted with splenomegaly and moderate ascites. Hepatofugal flow is noted in the portal vein. Electronically Signed   By: Marijo Conception, M.D.   On: 02/14/2016 15:18    ASSESMENT:   * Cirrhosis of liver due to ETOH. AFP level normal 6/22 and no suggestion of tumor on 6/20 ultrasound.  Jaundice.  Rising T bili but alk phos, transaminases improved. MELD score 31 at arrival.  ETOH abstinence began late 01/2016.    * AKI, resolved. Diuretics on hold. Albumin doses x 4  7/10 - 7/11. not sure if Urine output measures are 100% accurate, as they state 925 cc yesterday, 1800  the day before.    * Coagulopathy. S/p Vit K SQ, then IV. PT/INR trending improved. .   *  Thrombocytopenia.   * Hyponatremia. on 2 liter fluid restriction.    * Ascites. 8.9 liter tap 6/20: no SBP. Day 4 empiric Rocephin.  Moderate ascites, increased splenomegaly per 7/10 ultrasound. Paracentesis delayed due to AKI, coagulopathy. weight up 14 # from admit level, 9 # in last 24 hours but these are bed scale weights so ??? Accuracy Note RN just got standing weight: 92.9 kg as opposed to 2AM bed weight of 97.9  * Macrocytic anemia. On Thiamine and Folic acid.   * FOBT +. Constipation generally. Daily Lactulose reinitiated 7/10. Was on this in past and Rifaximin for his HE. 10/2007 Colonoscopy: tubular adenoma, thickened haustral folds.  Has never had EGD.     PLAN   *  Will clear with Dr Loletha Carrow re arranging Paracentesis today.   *  Standing weight now and in future.     Allen Schroeder  02/16/2016, 8:29 AM Pager: [phone]  Addendum, 9242: S/p 5 liter tap.  Fluid WBCs 73:  No SBP so will stop Rocephin.

## 2016-02-16 NOTE — Progress Notes (Signed)
Patient ID: Allen Schroeder, male   DOB: November 01, 1950, 65 y.o.   MRN: [id number]  PROGRESS NOTE    Allen Schroeder  [id number] DOB: [birth year] DOA: 02/13/2016  PCP: [name], MD   Brief Narrative:  65 y.o. Male with past medical history of metastatic prostate cancer (dx 2009) s/p orchiectomy, bone metastases , Xtandi on hold due to liver disease, alcohol abuse, atrial fibrillation with RVR noted in 08/2015 and s/p succesful DCCV, then started on Pradaxa in place of Plavix.Cardiologist did not restart Plavix and pt now on sotalol and aspirin. Underwent 8.9 liter paracentesis on 01/25/16 with fluid WBCs just 27. Received IV Albumin post tap. Ultrasound post tap showed cirrhosis, small amount ascites, no liver tumor. At that point he ceased ETOH.He presented to [facility] ED with reports of chest pressure and dyspnea at rest and with exertion. Blood work showed INR 1.87, WBC 11.3, hgb 7.9, platelets 157. Ammonia was 19. AST 149, normal ALT, ALP 220, T bili 31 with DB 17.5, albumin 1.9. Pt was seen by GI in consultation.   Assessment & Plan:   Principal Problem:   Decompensated Alcoholic liver disease/Cirrhosis -with marked hyperbilirubinemia, large volume ascites, coagulopathy, hyponatremia, mild AKI and mild HE, Meld score was 31 on admission -Hopefully can have paracentesis today, creatinine improved, BP soft but stable -continue Ceftriaxone for SBP prophylaxis, received 4 doses of Albumin yesterday -Quit ETOH 2-3 weeks ago -Continue lactulose and rifaximin -GI following, does he need Palliative eval this admission -diuretics on hold    Alcohol abuse -quit few weeks ago - Continue folic acid, thiamine    Coagulopathy - Due to liver failure - Vitamin K given 5 mg IV 7/10    Atrial fibrillation (HCC) - CHADS vasc score 2 - INR 1.83, pt coagulopathic from liver failure, not an anticoagulation candidate - HR controlled without beta blocker     Anemia of chronic disease / Thrombocytopenia   - Due to bone marrow suppression from liver failure - Hemoglobin 7.9 --> 9.4 - Platelets 127, overall stable     Hypokalemia - Due to poor PO intake - Continue to supplement   Hyponatremia  - Secondary to liver cirrhosis -  Diuretics on hold, paracentesis today   H/o Prostate Cancer with mets -Xtandi stopped 6/12, followed by Dr.[name]  DVT prophylaxis: SCD's bilaterally  Code Status: DNR/DNI Family Communication: no family at bedside Disposition Plan: Keep in SDU today   Consultants:   GI, Dr. Wilfrid Lund   Procedures:   None   Antimicrobials:   Rocephin 02/13/2016 -->    Subjective: No overnight events. No respiratory distress.   Objective: Filed Vitals:   02/16/16 0254 02/16/16 0700 02/16/16 0710 02/16/16 0900  BP:  98/56 93/56   Pulse:  66    Temp:  98.7 F (37.1 C)    TempSrc:  Oral    Resp:  18    Height:      Weight: 97.9 kg (215 lb 13.3 oz)   92.9 kg (204 lb 12.9 oz)  SpO2:  100%      Intake/Output Summary (Last 24 hours) at 02/16/16 1132 Last data filed at 02/16/16 0700  Gross per 24 hour  Intake    243 ml  Output    750 ml  Net   -507 ml   Filed Weights   02/15/16 0618 02/16/16 0254 02/16/16 0900  Weight: 93.6 kg (206 lb 5.6 oz) 97.9 kg (215 lb 13.3 oz) 92.9 kg (204 lb 12.9 oz)  Examination:  General exam: AAOx3, no distress HEENT: Scleral icterus noted Respiratory system: No wheezing, no rhonchi  Cardiovascular system: S1 & S2 appreciated, rate controlled  Gastrointestinal system: (+) BS, distended but not tender , fluid thrill  Central nervous system: NO focal deficits . Extremities: No swelling, palpable pulses  Skin: Skin is warm and dry, icteric  Psychiatry: Normal mood and behavior, oriented to time, place and person   Data Reviewed: I have personally reviewed following labs and imaging studies  CBC:  Recent Labs Lab 02/13/16 0930 02/14/16 0528 02/16/16 0418  WBC 11.3* 10.3 10.1  NEUTROABS 9.4*  --   --   HGB 7.9*  9.4* 8.3*  HCT 23.0* 26.4* 23.9*  MCV 122.3* 110.3* 112.7*  PLT 157 127* 123XX123*   Basic Metabolic Panel:  Recent Labs Lab 02/13/16 0930 02/13/16 1415 02/14/16 0528 02/15/16 0313 02/16/16 0418  NA 126*  --  128* 127* 125*  K 3.2*  --  3.1* 3.3* 3.7  CL 87*  --  90* 91* 91*  CO2 30  --  29 28 26   GLUCOSE 88  --  84 90 98  BUN 32*  --  28* 22* 18  CREATININE 1.67*  --  1.39* 1.15 1.02  CALCIUM 7.7*  --  7.5* 7.6* 7.7*  MG  --  2.5*  --   --   --   PHOS  --  4.7*  --   --   --    GFR: Estimated Creatinine Clearance: 81.7 mL/min (by C-G formula based on Cr of 1.02). Liver Function Tests:  Recent Labs Lab 02/13/16 0930 02/14/16 0528 02/15/16 0313 02/16/16 0418  AST 149* 140* 123* 115*  ALT 59 58 53 54  ALKPHOS 220* 207* 191* 177*  BILITOT 31.0* 31.3* 31.1* 32.3*  PROT 7.2 6.7 7.1 6.7  ALBUMIN 1.9* 1.9* 2.6* 2.5*   No results for input(s): LIPASE, AMYLASE in the last 168 hours.  Recent Labs Lab 02/13/16 0936 02/14/16 0528  AMMONIA 19 61*   Coagulation Profile:  Recent Labs Lab 02/13/16 0930 02/14/16 0528 02/15/16 0313 02/16/16 0418  INR 1.87* 2.04* 1.83* 1.81*   Cardiac Enzymes: No results for input(s): CKTOTAL, CKMB, CKMBINDEX, TROPONINI in the last 168 hours. BNP (last 3 results) No results for input(s): PROBNP in the last 8760 hours. HbA1C: No results for input(s): HGBA1C in the last 72 hours. CBG: No results for input(s): GLUCAP in the last 168 hours. Lipid Profile: No results for input(s): CHOL, HDL, LDLCALC, TRIG, CHOLHDL, LDLDIRECT in the last 72 hours. Thyroid Function Tests: No results for input(s): TSH, T4TOTAL, FREET4, T3FREE, THYROIDAB in the last 72 hours. Anemia Panel: No results for input(s): VITAMINB12, FOLATE, FERRITIN, TIBC, IRON, RETICCTPCT in the last 72 hours. Urine analysis:    Component Value Date/Time   COLORURINE ORANGE* 02/13/2016 2016   APPEARANCEUR CLOUDY* 02/13/2016 2016   LABSPEC 1.016 02/13/2016 2016   PHURINE 6.5  02/13/2016 2016   GLUCOSEU NEGATIVE 02/13/2016 2016   HGBUR NEGATIVE 02/13/2016 2016   BILIRUBINUR LARGE* 02/13/2016 2016   KETONESUR 15* 02/13/2016 2016   PROTEINUR NEGATIVE 02/13/2016 2016   UROBILINOGEN 0.2 12/21/2014 1816   NITRITE POSITIVE* 02/13/2016 2016   LEUKOCYTESUR SMALL* 02/13/2016 2016   Sepsis Labs: @LABRCNTIP (procalcitonin:4,lacticidven:4)    Culture, blood (Routine X 2) w Reflex to ID Panel     Status: None (Preliminary result)   Collection Time: 02/13/16  1:25 PM  Result Value Ref Range Status   Specimen Description BLOOD RIGHT HAND  Final  Special Requests BOTTLES DRAWN AEROBIC AND ANAEROBIC 5ML  Final   Culture NO GROWTH < 24 HOURS  Final   Report Status PENDING  Incomplete  Culture, blood (Routine X 2) w Reflex to ID Panel     Status: None (Preliminary result)   Collection Time: 02/13/16  1:25 PM  Result Value Ref Range Status   Specimen Description BLOOD RIGHT HAND  Final   Special Requests BOTTLES DRAWN AEROBIC AND ANAEROBIC 5ML  Final   Culture NO GROWTH < 24 HOURS  Final   Report Status PENDING  Incomplete  MRSA PCR Screening     Status: None   Collection Time: 02/13/16  1:49 PM  Result Value Ref Range Status   MRSA by PCR NEGATIVE NEGATIVE Final      Radiology Studies: Dg Chest 2 View 02/13/2016  Bibasilar atelectasis.   US Abdomen Complete 02/14/2016   Hepatic cirrhosis is noted with splenomegaly and moderate ascites. Hepatofugal flow is noted in the portal vein.    Scheduled Meds: . albumin human  50 g Intravenous Once  . cefTRIAXone (ROCEPHIN)  IV  2 g Intravenous Q24H  . folic acid  1 mg Oral Daily  . lactulose  30 g Oral Daily  . rifaximin  550 mg Oral BID  . sodium chloride flush  3 mL Intravenous Q12H  . thiamine  100 mg Oral Daily   Continuous Infusions:    LOS: 3 days    Time spent: 35 minutes  Greater than 50% of the time spent on counseling and coordinating the care.   Domenic Polite, MD [facility] Pager  [phone] If 7PM-7AM, please contact night-coverage [email or web address] Password [facility] 02/16/2016, 11:32 AM

## 2016-02-16 NOTE — Progress Notes (Signed)
Asked patient if he would like to get up to chair, he stated "I think I'll just lay here another 20 minutes or so".  Will attempt again around the stated time frame.  Will continue to monitor patient.

## 2016-02-16 NOTE — Procedures (Signed)
  US guided LLQ paracentesis  5 L maximum- bright yellow fluid obtained Sent for labs per MD  tolerated well

## 2016-02-17 LAB — COMPREHENSIVE METABOLIC PANEL
ALK PHOS: 174 U/L — AB (ref 38–126)
ALT: 47 U/L (ref 17–63)
ANION GAP: 7 (ref 5–15)
AST: 100 U/L — ABNORMAL HIGH (ref 15–41)
Albumin: 2.6 g/dL — ABNORMAL LOW (ref 3.5–5.0)
BUN: 16 mg/dL (ref 6–20)
CALCIUM: 8 mg/dL — AB (ref 8.9–10.3)
CO2: 28 mmol/L (ref 22–32)
CREATININE: 0.92 mg/dL (ref 0.61–1.24)
Chloride: 92 mmol/L — ABNORMAL LOW (ref 101–111)
Glucose, Bld: 90 mg/dL (ref 65–99)
Potassium: 3.9 mmol/L (ref 3.5–5.1)
SODIUM: 127 mmol/L — AB (ref 135–145)
TOTAL PROTEIN: 6.9 g/dL (ref 6.5–8.1)
Total Bilirubin: 29.6 mg/dL (ref 0.3–1.2)

## 2016-02-17 LAB — CBC
HEMATOCRIT: 23.7 % — AB (ref 39.0–52.0)
HEMOGLOBIN: 8.3 g/dL — AB (ref 13.0–17.0)
MCH: 39.5 pg — AB (ref 26.0–34.0)
MCHC: 35 g/dL (ref 30.0–36.0)
MCV: 112.9 fL — AB (ref 78.0–100.0)
Platelets: 109 10*3/uL — ABNORMAL LOW (ref 150–400)
RBC: 2.1 MIL/uL — AB (ref 4.22–5.81)
WBC: 9.8 10*3/uL (ref 4.0–10.5)

## 2016-02-17 MED ORDER — LACTULOSE 10 GM/15ML PO SOLN
30.0000 g | Freq: Two times a day (BID) | ORAL | Status: DC
  Administered 2016-02-17 – 2016-02-18 (×2): 30 g via ORAL
  Filled 2016-02-17 (×2): qty 45

## 2016-02-17 NOTE — Progress Notes (Signed)
Patient ID: Allen Schroeder, male   DOB: 1950/08/10, 65 y.o.   MRN: [id number]  PROGRESS NOTE    Allen Schroeder  [id number] DOB: [birth year] DOA: 02/13/2016  PCP: [name], MD   Brief Narrative:  65 y.o. Male with past medical history of metastatic prostate cancer (dx 2009) s/p orchiectomy, bone metastases , Xtandi on hold due to liver disease, alcohol abuse, atrial fibrillation with RVR noted in 08/2015 and s/p succesful DCCV, then started on Pradaxa in place of Plavix.Cardiologist did not restart Plavix and pt now on sotalol and aspirin. Underwent 8.9 liter paracentesis on 01/25/16 with fluid WBCs just 27. Received IV Albumin post tap. Ultrasound post tap showed cirrhosis, small amount ascites, no liver tumor. At that point he ceased ETOH.He presented to [facility] ED with reports of chest pressure and dyspnea at rest and with exertion. Blood work showed INR 1.87, WBC 11.3, hgb 7.9, platelets 157. Ammonia was 19. AST 149, normal ALT, ALP 220, T bili 31 with DB 17.5, albumin 1.9. Pt was seen by GI in consultation.   Assessment & Plan:   Principal Problem:   Decompensated Alcoholic liver disease/Cirrhosis -with marked hyperbilirubinemia, large volume ascites, coagulopathy, hyponatremia, mild AKI and mild HE, Meld score was 31 on admission -s/p large volume paracentesis, s/p 5L removal, no SBP -continue Ceftriaxone for SBP prophylaxis, received 4 doses of Albumin  -Quit ETOH 2-3 weeks ago -Continue lactulose and rifaximin -GI following, does he need Palliative eval this admission -diuretics on hold, slight improvement in Bili    Alcohol abuse -quit few weeks ago - Continue folic acid, thiamine    Coagulopathy - Due to liver failure - Vitamin K given 5 mg IV 7/10    Atrial fibrillation (HCC) - CHADS vasc score 2 - INR 1.83, pt coagulopathic from liver failure, not an anticoagulation candidate - HR controlled without beta blocker     Anemia of chronic disease / Thrombocytopenia  - Due  to bone marrow suppression from liver failure - Hemoglobin 7.9 --> 9.4 - Platelets 127, overall stable     Hypokalemia - Due to poor PO intake - Continue to supplement   Hyponatremia  - Secondary to liver cirrhosis -  Diuretics on hold, paracentesis today   H/o Prostate Cancer with mets -Xtandi stopped 6/12, followed by Dr.[name]  DVT prophylaxis: SCD's bilaterally  Code Status: DNR/DNI Family Communication: no family at bedside Disposition Plan: Tx to Floor    Consultants:   GI, Dr. Wilfrid Lund   Procedures:   None   Antimicrobials:   Rocephin 02/13/2016 -->    Subjective: No overnight events. No respiratory distress.   Objective: Filed Vitals:   02/17/16 0600 02/17/16 0700 02/17/16 0823 02/17/16 1158  BP:   92/45 106/51  Pulse: 63 63 63 72  Temp:   98.2 F (36.8 C) 98.5 F (36.9 C)  TempSrc:   Oral Oral  Resp: 18 17 17 14   Height:      Weight:      SpO2: 98% 97% 98%     Intake/Output Summary (Last 24 hours) at 02/17/16 1428 Last data filed at 02/17/16 1337  Gross per 24 hour  Intake    950 ml  Output   1175 ml  Net   -225 ml   Filed Weights   02/16/16 0254 02/16/16 0900 02/17/16 0500  Weight: 97.9 kg (215 lb 13.3 oz) 92.9 kg (204 lb 12.9 oz) 89 kg (196 lb 3.4 oz)    Examination:  General exam: AAOx3,  no distress HEENT: Scleral icterus noted Respiratory system: No wheezing, no rhonchi  Cardiovascular system: S1 & S2 appreciated, rate controlled  Gastrointestinal system: (+) BS, distended but not tender , fluid thrill  Central nervous system: NO focal deficits . Extremities: No swelling, palpable pulses  Skin: Skin is warm and dry, icteric  Psychiatry: Normal mood and behavior, oriented to time, place and person   Data Reviewed: I have personally reviewed following labs and imaging studies  CBC:  Recent Labs Lab 02/13/16 0930 02/14/16 0528 02/16/16 0418 02/17/16 0653  WBC 11.3* 10.3 10.1 9.8  NEUTROABS 9.4*  --   --   --   HGB 7.9*  9.4* 8.3* 8.3*  HCT 23.0* 26.4* 23.9* 23.7*  MCV 122.3* 110.3* 112.7* 112.9*  PLT 157 127* 117* 0000000*   Basic Metabolic Panel:  Recent Labs Lab 02/13/16 0930 02/13/16 1415 02/14/16 0528 02/15/16 0313 02/16/16 0418 02/17/16 0653  NA 126*  --  128* 127* 125* 127*  K 3.2*  --  3.1* 3.3* 3.7 3.9  CL 87*  --  90* 91* 91* 92*  CO2 30  --  29 28 26 28   GLUCOSE 88  --  84 90 98 90  BUN 32*  --  28* 22* 18 16  CREATININE 1.67*  --  1.39* 1.15 1.02 0.92  CALCIUM 7.7*  --  7.5* 7.6* 7.7* 8.0*  MG  --  2.5*  --   --   --   --   PHOS  --  4.7*  --   --   --   --    GFR: Estimated Creatinine Clearance: 90.6 mL/min (by C-G formula based on Cr of 0.92). Liver Function Tests:  Recent Labs Lab 02/13/16 0930 02/14/16 0528 02/15/16 0313 02/16/16 0418 02/17/16 0653  AST 149* 140* 123* 115* 100*  ALT 59 58 53 54 47  ALKPHOS 220* 207* 191* 177* 174*  BILITOT 31.0* 31.3* 31.1* 32.3* 29.6*  PROT 7.2 6.7 7.1 6.7 6.9  ALBUMIN 1.9* 1.9* 2.6* 2.5* 2.6*   No results for input(s): LIPASE, AMYLASE in the last 168 hours.  Recent Labs Lab 02/13/16 0936 02/14/16 0528  AMMONIA 19 61*   Coagulation Profile:  Recent Labs Lab 02/13/16 0930 02/14/16 0528 02/15/16 0313 02/16/16 0418  INR 1.87* 2.04* 1.83* 1.81*   Cardiac Enzymes: No results for input(s): CKTOTAL, CKMB, CKMBINDEX, TROPONINI in the last 168 hours. BNP (last 3 results) No results for input(s): PROBNP in the last 8760 hours. HbA1C: No results for input(s): HGBA1C in the last 72 hours. CBG: No results for input(s): GLUCAP in the last 168 hours. Lipid Profile: No results for input(s): CHOL, HDL, LDLCALC, TRIG, CHOLHDL, LDLDIRECT in the last 72 hours. Thyroid Function Tests: No results for input(s): TSH, T4TOTAL, FREET4, T3FREE, THYROIDAB in the last 72 hours. Anemia Panel: No results for input(s): VITAMINB12, FOLATE, FERRITIN, TIBC, IRON, RETICCTPCT in the last 72 hours. Urine analysis:    Component Value Date/Time    COLORURINE ORANGE* 02/13/2016 2016   APPEARANCEUR CLOUDY* 02/13/2016 2016   LABSPEC 1.016 02/13/2016 2016   PHURINE 6.5 02/13/2016 2016   GLUCOSEU NEGATIVE 02/13/2016 2016   HGBUR NEGATIVE 02/13/2016 2016   BILIRUBINUR LARGE* 02/13/2016 2016   KETONESUR 15* 02/13/2016 2016   PROTEINUR NEGATIVE 02/13/2016 2016   UROBILINOGEN 0.2 12/21/2014 1816   NITRITE POSITIVE* 02/13/2016 2016   LEUKOCYTESUR SMALL* 02/13/2016 2016   Sepsis Labs: @LABRCNTIP (procalcitonin:4,lacticidven:4)    Culture, blood (Routine X 2) w Reflex to ID Panel  Status: None (Preliminary result)   Collection Time: 02/13/16  1:25 PM  Result Value Ref Range Status   Specimen Description BLOOD RIGHT HAND  Final   Special Requests BOTTLES DRAWN AEROBIC AND ANAEROBIC 5ML  Final   Culture NO GROWTH < 24 HOURS  Final   Report Status PENDING  Incomplete  Culture, blood (Routine X 2) w Reflex to ID Panel     Status: None (Preliminary result)   Collection Time: 02/13/16  1:25 PM  Result Value Ref Range Status   Specimen Description BLOOD RIGHT HAND  Final   Special Requests BOTTLES DRAWN AEROBIC AND ANAEROBIC 5ML  Final   Culture NO GROWTH < 24 HOURS  Final   Report Status PENDING  Incomplete  MRSA PCR Screening     Status: None   Collection Time: 02/13/16  1:49 PM  Result Value Ref Range Status   MRSA by PCR NEGATIVE NEGATIVE Final      Radiology Studies: Dg Chest 2 View 02/13/2016  Bibasilar atelectasis.   US Abdomen Complete 02/14/2016   Hepatic cirrhosis is noted with splenomegaly and moderate ascites. Hepatofugal flow is noted in the portal vein.    Scheduled Meds: . folic acid  1 mg Oral Daily  . lactulose  30 g Oral BID  . rifaximin  550 mg Oral BID  . sodium chloride flush  3 mL Intravenous Q12H  . thiamine  100 mg Oral Daily   Continuous Infusions:    LOS: 4 days    Time spent: 35 minutes  Greater than 50% of the time spent on counseling and coordinating the care.   Domenic Polite, MD [facility] Pager [phone] If 7PM-7AM, please contact night-coverage [email or web address] Password [facility] 02/17/2016, 2:28 PM

## 2016-02-17 NOTE — Progress Notes (Signed)
Report received from [place], [place] for transfer to [phone]

## 2016-02-17 NOTE — Progress Notes (Signed)
Patient transferred from 3S. Alert and oriented. No signs of acute distress. Skin intact. Placed on telemetry.

## 2016-02-17 NOTE — Care Management Important Message (Signed)
Important Message  Patient Details  Name: Allen Schroeder MRN: [id number] Date of Birth: March 15, 1951   Medicare Important Message Given:  Yes    Nathen May 02/17/2016, 1:34 PM

## 2016-02-17 NOTE — Progress Notes (Signed)
Daily Rounding Note  02/17/2016, 8:39 AM  LOS: 4 days   SUBJECTIVE:       Just one BM yesterday.  No nausea, appetite improved.  Belly feels better, less bloated, post tap.  No issues with breathing  OBJECTIVE:         Vital signs in last 24 hours:    Temp:  [97.8 F (36.6 C)-98.8 F (37.1 C)] 98.2 F (36.8 C) (07/13 0823) Pulse Rate:  [63-73] 63 (07/13 0823) Resp:  [11-19] 17 (07/13 0823) BP: (86-102)/(41-60) 92/45 mmHg (07/13 0823) SpO2:  [96 %-99 %] 98 % (07/13 0823) Weight:  [89 kg (196 lb 3.4 oz)-92.9 kg (204 lb 12.9 oz)] 89 kg (196 lb 3.4 oz) (07/13 0500) Last BM Date: 02/14/16 Filed Weights   02/16/16 0254 02/16/16 0900 02/17/16 0500  Weight: 97.9 kg (215 lb 13.3 oz) 92.9 kg (204 lb 12.9 oz) 89 kg (196 lb 3.4 oz)   General: jaundiced, o/w looks well. comfortable   Heart: RRR. Chest: clear bil.  No cough or dyspnea Abdomen: soft, mild distention, active BS.  NT.    Extremities: no CCE Neuro/Psych:  Oriented x 3, calm.  Mentation a bit slow and movement deliberate and slow.  No tremor or gross weakness.  No asterixis.   Intake/Output from previous day: 07/12 0701 - 07/13 0700 In: 640 [P.O.:390; IV Piggyback:250] Out: 1000 [Urine:1000]  Intake/Output this shift:    Lab Results:  Recent Labs  02/16/16 0418  WBC 10.1  HGB 8.3*  HCT 23.9*  PLT 117*   BMET  Recent Labs  02/15/16 0313 02/16/16 0418 02/17/16 0653  NA 127* 125* 127*  K 3.3* 3.7 3.9  CL 91* 91* 92*  CO2 _0 GLUCOSE 90 98 90  BUN 22* 18 16  CREATININE 1.15 1.02 0.92  CALCIUM 7.6* 7.7* 8.0*   LFT  Recent Labs  02/15/16 0313 02/16/16 0418 02/17/16 0653  PROT 7.1 6.7 6.9  ALBUMIN 2.6* 2.5* 2.6*  AST 123* 115* 100*  ALT 53 54 47  ALKPHOS 191* 177* 174*  BILITOT 31.1* 32.3* 29.6*  BILIDIR  --  19.0*  --   IBILI  --  13.3*  --    PT/INR  Recent Labs  02/15/16 0313 02/16/16 0418  LABPROT 21.1* 20.9*  INR  1.83* 1.81*   Hepatitis Panel No results for input(s): HEPBSAG, HCVAB, HEPAIGM, HEPBIGM in the last 72 hours.  Studies/Results: US Paracentesis  02/16/2016  INDICATION: Ascites; jaundice EXAM: ULTRASOUND-GUIDED PARACENTESIS COMPARISON:  Previous paracentesis. MEDICATIONS: 10 cc 1% lidocaine COMPLICATIONS: None immediate. TECHNIQUE: Informed written consent was obtained from the patient after a discussion of the risks, benefits and alternatives to treatment. A timeout was performed prior to the initiation of the procedure. Initial ultrasound scanning demonstrates a large amount of ascites within the left lower abdominal quadrant. The left lower abdomen was prepped and draped in the usual sterile fashion. 1% lidocaine with epinephrine was used for local anesthesia. Under direct ultrasound guidance, a 19 gauge, 7-cm, Yueh catheter was introduced. An ultrasound image was saved for documentation purposed. The paracentesis was performed. The catheter was removed and a dressing was applied. The patient tolerated the procedure well without immediate post procedural complication. FINDINGS: A total of approximately 5 liters of bright yellow fluid was removed. Samples were sent to the laboratory as requested by the clinical team. IMPRESSION: Successful ultrasound-guided paracentesis yielding 5 liters of peritoneal fluid. Maximum per MD Read by:  Lavonia Drafts [facility] Electronically Signed   By: Corrie Mckusick D.O.   On: 02/16/2016 12:47   Scheduled Meds: . folic acid  1 mg Oral Daily  . lactulose  30 g Oral Daily  . rifaximin  550 mg Oral BID  . sodium chloride flush  3 mL Intravenous Q12H  . thiamine  100 mg Oral Daily   Continuous Infusions:  PRN Meds:.morphine injection, oxyCODONE, oxymetazoline   ASSESMENT:   * Cirrhosis of liver due to ETOH.Jaundice. T bili down in last 24 hours, along with steady downtrend in transaminases and alk phos.  * AKI, resolved. Diuretics on hold. Albumin doses 7/10 -  7/11 and post tap 7/12.    * Coagulopathy. S/p Vit K SQ, then IV. PT/INR trending improved. .   * Thrombocytopenia.  Non-critical but downtrending.   * Hyponatremia. On 2 liter fluid restriction. Na improved.   * Ascites. 8.9 liter tap 6/20, 5 liter tap 7/12: no SBP so d/c'd empiric Rocephin 7/12.  Wt down 3.9 gm in last 24 hours.   * Macrocytic anemia. On Thiamine and Folic acid.   *  HE.  On po lactulose and Rifaximin.   * FOBT + without overt bleeding.Constipation generally.10/2007 Colonoscopy: tubular adenoma, thickened haustral folds. No prior  EGD.    PLAN   *  Plans re: resumption of Aldactone/Lasix per Dr Loletha Carrow.  Note that BPs are soft, though pt not c/o hypotension related sxs.   Will up Lactulose to BID.   *  From GI perspective, pt is ok to transfer to floor, non-tele bed.    *  Needs to get walking.     Allen Schroeder  02/17/2016, 8:39 AM Pager: [phone]

## 2016-02-18 ENCOUNTER — Encounter
Admission: RE | Admit: 2016-02-18 | Discharge: 2016-02-18 | Disposition: A | Discharge status: Home / Self Care | Payer: Medicare Other | Source: Ambulatory Visit | Attending: Internal Medicine | Admitting: Internal Medicine

## 2016-02-18 LAB — COMPREHENSIVE METABOLIC PANEL
ALBUMIN: 2.6 g/dL — AB (ref 3.5–5.0)
ALK PHOS: 183 U/L — AB (ref 38–126)
ALT: 46 U/L (ref 17–63)
ANION GAP: 7 (ref 5–15)
AST: 97 U/L — ABNORMAL HIGH (ref 15–41)
BILIRUBIN TOTAL: 28.7 mg/dL — AB (ref 0.3–1.2)
BUN: 19 mg/dL (ref 6–20)
CALCIUM: 8.2 mg/dL — AB (ref 8.9–10.3)
CO2: 28 mmol/L (ref 22–32)
Chloride: 94 mmol/L — ABNORMAL LOW (ref 101–111)
Creatinine, Ser: 1.1 mg/dL (ref 0.61–1.24)
GFR calc Af Amer: >60 mL/min (ref >60)
GLUCOSE: 113 mg/dL — AB (ref 65–99)
Potassium: 4 mmol/L (ref 3.5–5.1)
Sodium: 129 mmol/L — ABNORMAL LOW (ref 135–145)
TOTAL PROTEIN: 7 g/dL (ref 6.5–8.1)

## 2016-02-18 LAB — CULTURE, BLOOD (ROUTINE X 2)
CULTURE: NO GROWTH
Culture: NO GROWTH

## 2016-02-18 LAB — CBC
HCT: 24.4 % — ABNORMAL LOW (ref 39.0–52.0)
HEMOGLOBIN: 8.5 g/dL — AB (ref 13.0–17.0)
MCH: 39.7 pg — ABNORMAL HIGH (ref 26.0–34.0)
MCHC: 34.8 g/dL (ref 30.0–36.0)
MCV: 114 fL — ABNORMAL HIGH (ref 78.0–100.0)
Platelets: 112 10*3/uL — ABNORMAL LOW (ref 150–400)
RBC: 2.14 MIL/uL — ABNORMAL LOW (ref 4.22–5.81)
RDW: 25.8 % — AB (ref 11.5–15.5)
WBC: 11.9 10*3/uL — AB (ref 4.0–10.5)

## 2016-02-18 LAB — PATHOLOGIST SMEAR REVIEW

## 2016-02-18 MED ORDER — SOTALOL HCL 80 MG PO TABS: 80.0000 mg | ORAL_TABLET | Freq: Two times a day (BID) | ORAL | Status: DC

## 2016-02-18 MED ORDER — LACTULOSE 10 GM/15ML PO SOLN: 30.0000 g | Freq: Two times a day (BID) | ORAL | Status: DC

## 2016-02-18 MED ORDER — OXYCODONE HCL 5 MG PO TABS: 5.0000 mg | ORAL_TABLET | Freq: Two times a day (BID) | ORAL | Status: DC | PRN

## 2016-02-18 NOTE — NC FL2 (Signed)
[place] LEVEL OF CARE SCREENING TOOL     IDENTIFICATION  Patient Name: Allen Schroeder Birthdate: 1951/01/03 Sex: male Admission Date (Current Location): 02/13/2016  [facility] and [place] Number:  Herbalist and Address:  [facility] [place], [place] [address] [place], [place] [address]      Provider Number: [id number]  Attending Physician Name and Address:  Domenic Polite, MD  Relative Name and Phone Number:       Current Level of Care: Hospital Recommended Level of Care: [place] Prior Approval Number:    Date Approved/Denied:   PASRR Number: [id number]  Discharge Plan: SNF    Current Diagnoses: Patient Active Problem List   Diagnosis Date Noted  . Ascites due to alcoholic cirrhosis ([place])   . Alcoholic cirrhosis of liver with ascites ([place])   . Jaundice   . Acute renal failure ([place])   . Anemia of chronic disease   . Decompensation of cirrhosis of liver ([place]) 02/13/2016  . Hepatorenal syndrome ([place]) 02/13/2016  . Prolonged Q-T interval on ECG 02/13/2016  . Hypotension 02/13/2016  . Symptomatic anemia 02/13/2016  . Hyponatremia 01/28/2016  . Right-sided low back pain without sciatica   . Encephalopathy, hepatic ([place])   . Cirrhosis with alcoholism ([place]) 12/21/2014  . Rib fractures   . Portal hypertension ([place])   . Thrombocytopenia ([place])   . Generalized anxiety disorder   . Ascites 11/18/2014  . 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 Prostate cancer, primary, with metastasis from prostate to other site [facility]) 02/13/2012  . History of placement of stent in LAD coronary artery 02/13/2012  . Alcohol abuse with physiological dependence ([place]) 02/13/2012  . CAD, NATIVE VESSEL 04/26/2010  . HLD (hyperlipidemia) 01/22/2009  . Atrial fibrillation ([place]) 12/29/2008  . Essential hypertension 02/10/2008    Orientation RESPIRATION BLADDER Height & Weight     Self, Time, Situation, Place   Normal External catheter Weight: 89 kg (196 lb 3.4 oz) Height:  6\' 2"  (188 cm)  BEHAVIORAL SYMPTOMS/MOOD NEUROLOGICAL BOWEL NUTRITION STATUS      Incontinent Diet  AMBULATORY STATUS COMMUNICATION OF NEEDS Skin   Extensive Assist Verbally Normal                       Personal Care Assistance Level of Assistance  Bathing, Dressing Bathing Assistance: Maximum assistance   Dressing Assistance: Maximum assistance     Functional Limitations Info             SPECIAL CARE FACTORS FREQUENCY  PT (By licensed PT), OT (By licensed OT)     PT Frequency: 5/wk OT Frequency: 5/wk            Contractures      Additional Factors Info  Code Status, Allergies Code Status Info: DNR Allergies Info: NKA           Current Medications (02/18/2016):  This is the current hospital active medication list Current Facility-Administered Medications  Medication Dose Route Frequency Provider Last Rate Last Dose  . folic acid (FOLVITE) tablet 1 mg  1 mg Oral Daily Vena Rua, PA-C   1 mg at 02/18/16 0920  . lactulose (CHRONULAC) 10 GM/15ML solution 30 g  30 g Oral BID Vena Rua, PA-C   30 g at 02/18/16 0920  . morphine 2 MG/ML injection 1 mg  1 mg Intravenous Q2H PRN Samella Parr, NP      . oxyCODONE (  ROXICODONE) 5 MG/5ML solution 5 mg  5 mg Oral Q4H PRN Samella Parr, NP   5 mg at 02/18/16 0920  . oxymetazoline (AFRIN) 0.05 % nasal spray 1 spray  1 spray Each Nare QHS PRN Samella Parr, NP      . rifaximin Doreene Nest) tablet 550 mg  550 mg Oral BID Samella Parr, NP   550 mg at 02/18/16 0920  . sodium chloride flush (NS) 0.9 % injection 3 mL  3 mL Intravenous Q12H Samella Parr, NP   3 mL at 02/18/16 0920  . thiamine (VITAMIN B-1) tablet 100 mg  100 mg Oral Daily Vena Rua, PA-C   100 mg at 02/18/16 [id number]     Discharge Medications: Please see discharge summary for a list of discharge medications.  Relevant Imaging Results:  Relevant Lab  Results:   Additional Information SS#: [id number]  Cranford Mon, [place]

## 2016-02-18 NOTE — Care Management Note (Signed)
Case Management Note  Patient Details  Name: MONTIE ZAJAC MRN: [id number] Date of Birth: 04/30/1951  Subjective/Objective:                    Action/Plan:  Pt to DC to SNF as facilitated by CSW.   Expected Discharge Date:                  Expected Discharge Plan:  Skilled Nursing Facility  In-House Referral:  Clinical Social Work  Discharge planning Services  CM Consult  Post Acute Care Choice:  NA Choice offered to:  NA  DME Arranged:    DME Agency:     HH Arranged:    [place] Agency:     Status of Service:  Completed, signed off  If discussed at [employer], dates discussed:    Additional Comments:  Carles Collet, RN 02/18/2016, 1:42 PM

## 2016-02-18 NOTE — Progress Notes (Signed)
CSW consulted for same day DC- per pt and pt wife they have paid for a program through [employer] for rehab- [facility] in [place] does not have beds today but [facility] overflow facility at [facility] does have beds and can accept pt today- family agreeable.  Patient will discharge to [facility] Anticipated discharge date: 7/14 Family notified: pt wife at bedside, Priscille Heidelberg by [employer]- scheduled for 2pm  CSW signing off.  Domenica Reamer, [place] Social Worker [phone]

## 2016-02-18 NOTE — Clinical Social Work Placement (Signed)
   CLINICAL SOCIAL WORK PLACEMENT  NOTE  Date:  02/18/2016  Patient Details  Name: Allen Schroeder MRN: [id number] Date of Birth: 03-Dec-1950  Clinical Social Work is seeking post-discharge placement for this patient at the [place] level of care (*CSW will initial, date and re-position this form in  chart as items are completed):  Yes   Patient/family provided with [place] Work Department's list of facilities offering this level of care within the geographic area requested by the patient (or if unable, by the patient's family).  Yes   Patient/family informed of their freedom to choose among providers that offer the needed level of care, that participate in Medicare, Medicaid or managed care program needed by the patient, have an available bed and are willing to accept the patient.  Yes   Patient/family informed of [place]'s ownership interest in [facility] and [facility], as well as of the fact that they are under no obligation to receive care at these facilities.  PASRR submitted to EDS on 02/18/16     PASRR number received on 02/18/16     Existing PASRR number confirmed on       FL2 transmitted to all facilities in geographic area requested by pt/family on 02/18/16     FL2 transmitted to all facilities within larger geographic area on       Patient informed that his/her managed care company has contracts with or will negotiate with certain facilities, including the following:        Yes   Patient/family informed of bed offers received.  Patient chooses bed at [facility]     Physician recommends and patient chooses bed at      Patient to be transferred to [facility] on 02/18/16.  Patient to be transferred to facility by [facility]     Patient family notified on 02/18/16 of transfer.  Name of family member notified:  Dorian Pod     PHYSICIAN Please sign FL2, Please sign DNR     Additional Comment:     _______________________________________________ [place] Mon, LCSW 02/18/2016, 12:57 PM

## 2016-02-18 NOTE — Progress Notes (Signed)
PT Cancellation Note  Patient Details Name: Allen Schroeder MRN: [id number] DOB: 05/06/1951   Cancelled Treatment:    Reason Eval/Treat Not Completed: Patient needing to be cleaned up from [facility] and Nurse Tech aware.  Will check back shortly to complete evaluation.   Taiki Buckwalter LUBECK 02/18/2016, 11:17 AM

## 2016-02-18 NOTE — Discharge Summary (Signed)
Physician Discharge Summary  Allen Schroeder [id number] DOB: [birth year] DOA: 02/13/2016  PCP: [name], MD  Admit date: 02/13/2016 Discharge date: 02/18/2016  Time spent: 45 minutes  Recommendations for Outpatient Follow-up:  Dr.[name] or [name] in 1-2weeks, needs repeat labs(Cmet) in 1 week, consider resuming diuretics at FU 2.  Dr.[name] or [name] in 2weeks, Please note Sotalol held due to Prolonged QTC  Discharge Diagnoses:  Principal Problem:   Decompensation of cirrhosis of liver ([place]) Active Problems:   HLD (hyperlipidemia)   Essential hypertension   CAD, NATIVE VESSEL   Atrial fibrillation (Allen Schroeder)   History of placement of stent in LAD coronary artery   Alcohol abuse with physiological dependence (Allen Schroeder)   Ascites   Portal hypertension (Allen Schroeder)   Generalized anxiety disorder   Hepatorenal syndrome (Allen Schroeder)   Prolonged Q-T interval on ECG   Hypotension   Symptomatic anemia   Alcoholic cirrhosis of liver with ascites (Allen Schroeder)   Jaundice   Acute renal failure (Allen Schroeder)   Anemia of chronic disease   Ascites due to alcoholic cirrhosis [facility])   Discharge Condition: stable  Diet recommendation: Low sodium, heart healthy  Filed Weights   02/16/16 0254 02/16/16 0900 02/17/16 0500  Weight: 97.9 kg (215 lb 13.3 oz) 92.9 kg (204 lb 12.9 oz) 89 kg (196 lb 3.4 oz)    History of present illness:  65 y.o. Male with past medical history of metastatic prostate cancer (dx 2009) s/p orchiectomy, bone metastases , Xtandi on hold due to liver disease, alcohol abuse, atrial fibrillation with RVR noted in 08/2015 and s/p succesful DCCV, then started on Pradaxa in place of Plavix.Cardiologist did not restart Plavix and pt now on sotalol and aspirin. Underwent 8.9 liter paracentesis on 01/25/16 with fluid WBCs just 27. Received IV Albumin post tap. Ultrasound post tap showed cirrhosis, small amount ascites, no liver tumor. At that point he ceased ETOH.He presented to [facility] ED  with dyspnea at rest and with exertion. Blood work showed INR 1.87, WBC 11.3, hgb 7.9, platelets 157. Ammonia was 19. AST 149, normal ALT, ALP 220, T bili 31 with DB 17.5, albumin 1.9  Hospital Course:  Decompensated Alcoholic liver disease/Cirrhosis -with marked hyperbilirubinemia, large volume ascites, coagulopathy, hyponatremia, mild AKI and mild HE, Meld score was 31 on admission -s/p large volume paracentesis, s/p 5L removal, no SBP -received Ceftriaxone for SBP prophylaxis and 4 doses of Albumin  -Quit ETOH 2-3 weeks ago -Continue lactulose and rifaximin -Followed closely by [name] outpatient, per Gi due to AKI on admission, recommended holding diuretics at discharge and his Bili will likely takes weeks to improve, T bili is 28 now from 32 on admission, trending down slowly. -FU with Dr.[name] with repeat labs in 1 week recommended   Alcohol abuse -quit few weeks ago - Continue folic acid, thiamine, counseled   Coagulopathy - Due to liver failure - Vitamin K given 5 mg IV 7/10   Atrial fibrillation (Allen Schroeder) - CHADS vasc score 2 - INR 1.83, pt coagulopathic from liver failure, not an anticoagulation candidate - HR controlled without beta blocker, was on Sotalol prior to admission, but hasnt been able to get this here since his QTC was prolonged and hence if have held this at discharge, FU with Cards in 1-2weeks to determine if safe to resume at a lower dose. - he is in NSR with HR in 65-71 range this admission   Anemia of chronic disease / Thrombocytopenia  - Due  to bone marrow suppression from liver failure - Hemoglobin 7.9 --> 9.4 - Platelets 127, overall stable    Hypokalemia - Due to poor PO intake - Continue to supplement   Hyponatremia  - Secondary to liver cirrhosis - Diuretics held, Na improving  H/o Prostate Cancer with mets -Xtandi stopped 6/12, followed by Dr.[name]  Procedures:  Paracentesis: 5l drained  7/12  Consultations:  GI  Discharge Exam: Filed Vitals:   02/17/16 2110 02/18/16 0510  BP: 99/59 100/51  Pulse: 77 75  Temp: 99.3 F (37.4 C) 98.4 F (36.9 C)  Resp: 19 20    General: AAOx3, profound icterus Cardiovascular: S1S2/RRR Respiratory: CTAB  Discharge Instructions    Current Discharge Medication List    START taking these medications   Details  lactulose (CHRONULAC) 10 GM/15ML solution Take 45 mLs (30 g total) by mouth 2 (two) times daily. Qty: 240 mL, Refills: 0      CONTINUE these medications which have CHANGED   Details  oxyCODONE (OXY IR/ROXICODONE) 5 MG immediate release tablet Take 1-2 tablets (5-10 mg total) by mouth 2 (two) times daily as needed for severe pain. Qty: 30 tablet, Refills: 0      CONTINUE these medications which have NOT CHANGED   Details  Calcium Carb-Cholecalciferol (CALCIUM + D3 PO) Take 1 tablet by mouth 2 (two) times daily.    CVS ASPIRIN LOW DOSE 81 MG EC tablet TAKE 1 TABLET BY MOUTH EVERY DAY Qty: 30 tablet, Refills: 10    folic acid (FOLVITE) 1 MG tablet Take 1 mg by mouth daily. Refills: 11    Multiple Vitamin (MULTIVITAMIN WITH MINERALS) TABS tablet Take 1 tablet by mouth daily with breakfast.    oxymetazoline (AFRIN) 0.05 % nasal spray Place 1 spray into both nostrils at bedtime as needed for congestion.     rifaximin (XIFAXAN) 550 MG TABS tablet Take 1 tablet (550 mg total) by mouth 2 (two) times daily. Qty: 60 tablet, Refills: 0    thiamine 100 MG tablet Take 100 mg by mouth daily.    Vitamins-Lipotropics (CVS BALANCED B-100 PO) Take 1 tablet by mouth daily.      STOP taking these medications     furosemide (LASIX) 20 MG tablet      sotalol (BETAPACE) 80 MG tablet      spironolactone (ALDACTONE) 25 MG tablet      sulfamethoxazole-trimethoprim (BACTRIM DS,SEPTRA DS) 800-160 MG tablet      XTANDI 40 MG capsule        No Known Allergies    The results of significant diagnostics from this  hospitalization (including imaging, microbiology, ancillary and laboratory) are listed below for reference.    Significant Diagnostic Studies: Dg Chest 2 View  02/13/2016  CLINICAL DATA:  Short of breath EXAM: CHEST  2 VIEW COMPARISON:  12/21/2014 FINDINGS: Normal heart size. Low volumes. Bibasilar atelectasis. No pneumothorax or pleural effusion. IMPRESSION: Bibasilar atelectasis. Electronically Signed   By: Marybelle Killings M.D.   On: 02/13/2016 10:33   US Abdomen Complete  02/14/2016  CLINICAL DATA:  Alcoholic cirrhosis. EXAM: ABDOMEN ULTRASOUND COMPLETE COMPARISON:  Ultrasound of January 25, 2016. FINDINGS: Gallbladder: Status post cholecystectomy. Common bile duct: Diameter: 5 mm which is within normal limits. Liver: No focal lesion identified. Increased echogenicity is noted with slightly nodular margins consistent with hepatic cirrhosis. Hepatofugal flow is noted in the portal vein. IVC: No abnormality visualized. Pancreas: Visualized portion unremarkable. Spleen: Maximum measured length of 21 cm with calculated volume of 1500 cubic  cm consistent with splenomegaly. This is slightly enlarged compared to prior exam. Right Kidney: Length: 12.8 cm. 5.2 cm cyst is noted. Echogenicity within normal limits. No mass or hydronephrosis visualized. Left Kidney: Length: 11.6 cm. Echogenicity within normal limits. No mass or hydronephrosis visualized. Abdominal aorta: 3.1 cm abdominal aortic aneurysm is noted. Other findings: Moderate ascites is noted. IMPRESSION: Hepatic cirrhosis is noted with splenomegaly and moderate ascites. Hepatofugal flow is noted in the portal vein. Electronically Signed   By: Marijo Conception, M.D.   On: 02/14/2016 15:18   US Abdomen Complete  01/25/2016  CLINICAL DATA:  Alcoholic cirrhosis EXAM: ABDOMEN ULTRASOUND COMPLETE COMPARISON:  CT 11/11/2014 FINDINGS: Gallbladder: Prior cholecystectomy Common bile duct: Diameter: Not visualized Liver: Echogenic with mildly nodular contours compatible  with given history of cirrhosis. No visible focal abnormality. IVC: No abnormality visualized. Pancreas: Not well visualized Spleen: Enlarged with a craniocaudal length of 17 cm and a volume of 1200 mL Right Kidney: Length: 13.0 cm. 5.9 cm simple appearing cyst off the lower pole. Echogenicity within normal limits. No mass or hydronephrosis visualized. Left Kidney: Length: 12.4 cm. Echogenicity within normal limits. No mass or hydronephrosis visualized. Abdominal aorta: No aneurysm visualized. Other findings: Small amount of perihepatic ascites. IMPRESSION: Increased echotexture throughout the liver with subtle nodularity along the contours compatible with given history of cirrhosis. Small amount of perihepatic ascites. No acute findings seen. Electronically Signed   By: Rolm Baptise M.D.   On: 01/25/2016 15:05   US Paracentesis  02/16/2016  INDICATION: Ascites; jaundice EXAM: ULTRASOUND-GUIDED PARACENTESIS COMPARISON:  Previous paracentesis. MEDICATIONS: 10 cc 1% lidocaine COMPLICATIONS: None immediate. TECHNIQUE: Informed written consent was obtained from the patient after a discussion of the risks, benefits and alternatives to treatment. A timeout was performed prior to the initiation of the procedure. Initial ultrasound scanning demonstrates a large amount of ascites within the left lower abdominal quadrant. The left lower abdomen was prepped and draped in the usual sterile fashion. 1% lidocaine with epinephrine was used for local anesthesia. Under direct ultrasound guidance, a 19 gauge, 7-cm, Yueh catheter was introduced. An ultrasound image was saved for documentation purposed. The paracentesis was performed. The catheter was removed and a dressing was applied. The patient tolerated the procedure well without immediate post procedural complication. FINDINGS: A total of approximately 5 liters of bright yellow fluid was removed. Samples were sent to the laboratory as requested by the clinical team.  IMPRESSION: Successful ultrasound-guided paracentesis yielding 5 liters of peritoneal fluid. Maximum per MD Read by:  Lavonia Drafts [facility] Electronically Signed   By: Corrie Mckusick D.O.   On: 02/16/2016 12:47   US Paracentesis  01/25/2016  INDICATION: Metastatic prostate cancer, cirrhosis, recurrent ascites. Request made for diagnostic and therapeutic paracentesis. EXAM: ULTRASOUND GUIDED DIAGNOSTIC AND THERAPEUTIC PARACENTESIS MEDICATIONS: None. COMPLICATIONS: None immediate. PROCEDURE: Informed written consent was obtained from the patient after a discussion of the risks, benefits and alternatives to treatment. A timeout was performed prior to the initiation of the procedure. Initial ultrasound scanning demonstrates a large amount of ascites within the left lower abdominal quadrant. The left lower abdomen was prepped and draped in the usual sterile fashion. 1% lidocaine was used for local anesthesia. Following this, a Yueh catheter was introduced. An ultrasound image was saved for documentation purposes. The paracentesis was performed. The catheter was removed and a dressing was applied. The patient tolerated the procedure well without immediate post procedural complication. FINDINGS: A total of approximately 8.9 liters of golden yellow fluid  was removed. Samples were sent to the laboratory as requested by the clinical team. IMPRESSION: Successful ultrasound-guided diagnostic and therapeutic paracentesis yielding 8.9 liters of peritoneal fluid. The patient will receive IV albumin infusion postprocedure. Read by: Rowe Ailton, PA-C Electronically Signed   By: Jerilynn Mages.  Shick M.D.   On: 01/25/2016 14:13    Microbiology: Recent Results (from the past 240 hour(s))  Culture, blood (Routine X 2) w Reflex to ID Panel     Status: None (Preliminary result)   Collection Time: 02/13/16  1:25 PM  Result Value Ref Range Status   Specimen Description BLOOD RIGHT HAND  Final   Special Requests BOTTLES DRAWN AEROBIC AND  ANAEROBIC 5ML  Final   Culture NO GROWTH 4 DAYS  Final   Report Status PENDING  Incomplete  Culture, blood (Routine X 2) w Reflex to ID Panel     Status: None (Preliminary result)   Collection Time: 02/13/16  1:25 PM  Result Value Ref Range Status   Specimen Description BLOOD RIGHT HAND  Final   Special Requests BOTTLES DRAWN AEROBIC AND ANAEROBIC 5ML  Final   Culture NO GROWTH 4 DAYS  Final   Report Status PENDING  Incomplete  MRSA PCR Screening     Status: None   Collection Time: 02/13/16  1:49 PM  Result Value Ref Range Status   MRSA by PCR NEGATIVE NEGATIVE Final    Comment:        The GeneXpert MRSA Assay (FDA approved for NASAL specimens only), is one component of a comprehensive MRSA colonization surveillance program. It is not intended to diagnose MRSA infection nor to guide or monitor treatment for MRSA infections.      Labs: Basic Metabolic Panel:  Recent Labs Lab 02/13/16 1415 02/14/16 0528 02/15/16 0313 02/16/16 0418 02/17/16 0653 02/18/16 0628  NA  --  128* 127* 125* 127* 129*  K  --  3.1* 3.3* 3.7 3.9 4.0  CL  --  90* 91* 91* 92* 94*  CO2  --  29 28 26 28 28   GLUCOSE  --  84 90 98 90 113*  BUN  --  28* 22* 18 16 19   CREATININE  --  1.39* 1.15 1.02 0.92 1.10  CALCIUM  --  7.5* 7.6* 7.7* 8.0* 8.2*  MG 2.5*  --   --   --   --   --   PHOS 4.7*  --   --   --   --   --    Liver Function Tests:  Recent Labs Lab 02/14/16 0528 02/15/16 0313 02/16/16 0418 02/17/16 0653 02/18/16 0628  AST 140* 123* 115* 100* 97*  ALT 58 53 54 47 46  ALKPHOS 207* 191* 177* 174* 183*  BILITOT 31.3* 31.1* 32.3* 29.6* 28.7*  PROT 6.7 7.1 6.7 6.9 7.0  ALBUMIN 1.9* 2.6* 2.5* 2.6* 2.6*   No results for input(s): LIPASE, AMYLASE in the last 168 hours.  Recent Labs Lab 02/13/16 0936 02/14/16 0528  AMMONIA 19 61*   CBC:  Recent Labs Lab 02/13/16 0930 02/14/16 0528 02/16/16 0418 02/17/16 0653 02/18/16 0628  WBC 11.3* 10.3 10.1 9.8 11.9*  NEUTROABS 9.4*  --    --   --   --   HGB 7.9* 9.4* 8.3* 8.3* 8.5*  HCT 23.0* 26.4* 23.9* 23.7* 24.4*  MCV 122.3* 110.3* 112.7* 112.9* 114.0*  PLT 157 127* 117* 109* 112*   Cardiac Enzymes: No results for input(s): CKTOTAL, CKMB, CKMBINDEX, TROPONINI in the last 168 hours. BNP: BNP (  last 3 results) No results for input(s): BNP in the last 8760 hours.  ProBNP (last 3 results) No results for input(s): PROBNP in the last 8760 hours.  CBG: No results for input(s): GLUCAP in the last 168 hours.     SignedDomenic Polite MD.  [facility] 02/18/2016, 12:33 PM

## 2016-02-18 NOTE — Progress Notes (Addendum)
Nsg Discharge Note  Admit Date:  02/13/2016 Discharge date: 02/18/2016   Allen Schroeder to be D/C'd [facility] per MD order.  AVS completed.  Copy for chart, and copy for patient signed, and dated. Patient/caregiver able to verbalize understanding.  Discharge Medication:   Medication List    STOP taking these medications        furosemide 20 MG tablet  Commonly known as:  LASIX     sotalol 80 MG tablet  Commonly known as:  BETAPACE     spironolactone 25 MG tablet  Commonly known as:  ALDACTONE     sulfamethoxazole-trimethoprim 800-160 MG tablet  Commonly known as:  BACTRIM DS,SEPTRA DS     XTANDI 40 MG capsule  Generic drug:  enzalutamide      TAKE these medications        CALCIUM + D3 PO  Take 1 tablet by mouth 2 (two) times daily.     CVS ASPIRIN LOW DOSE 81 MG EC tablet  Generic drug:  aspirin  TAKE 1 TABLET BY MOUTH EVERY DAY     CVS BALANCED B-100 PO  Take 1 tablet by mouth daily.     folic acid 1 MG tablet  Commonly known as:  FOLVITE  Take 1 mg by mouth daily.     lactulose 10 GM/15ML solution  Commonly known as:  CHRONULAC  Take 45 mLs (30 g total) by mouth 2 (two) times daily.     multivitamin with minerals Tabs tablet  Take 1 tablet by mouth daily with breakfast.     oxyCODONE 5 MG immediate release tablet  Commonly known as:  Oxy IR/ROXICODONE  Take 1-2 tablets (5-10 mg total) by mouth 2 (two) times daily as needed for severe pain.     oxymetazoline 0.05 % nasal spray  Commonly known as:  AFRIN  Place 1 spray into both nostrils at bedtime as needed for congestion.     rifaximin 550 MG Tabs tablet  Commonly known as:  XIFAXAN  Take 1 tablet (550 mg total) by mouth 2 (two) times daily.     thiamine 100 MG tablet  Take 100 mg by mouth daily.        Discharge Assessment: Filed Vitals:   02/17/16 2110 02/18/16 0510  BP: 99/59 100/51  Pulse: 77 75  Temp: 99.3 F (37.4 C) 98.4 F (36.9 C)  Resp: 19 20   Skin clean, dry and  intact without evidence of skin break down, no evidence of skin tears noted. IV catheter discontinued intact. Site without signs and symptoms of complications - no redness or edema noted at insertion site, patient denies c/o pain - only slight tenderness at site.  Dressing with slight pressure applied.  D/c Instructions-Education: Discharge instructions given to patient/family with verbalized understanding. D/c education completed with patient/family including follow up instructions, medication list, d/c activities limitations if indicated, with other d/c instructions as indicated by MD - patient able to verbalize understanding, all questions fully answered. Patient instructed to return to ED, call 911, or call MD for any changes in condition.  [facility] called for transportation. Report given to nurse at [place], RN 02/18/2016 1:33 PM

## 2016-02-18 NOTE — Progress Notes (Signed)
CRITICAL VALUE ALERT  Critical value received:  Total bilirubin 28.7  Date of notification:  02/18/16  Time of notification:  0720  Critical value read back:Yes.    Nurse who received alert:  Richarda Overlie  MD notified (1st page):  Dr. Broadus John  Time of first page:  0727  MD notified (2nd page):  Time of second page:  Responding MD:  Dr. Broadus John  Time MD responded:  [phone]

## 2016-02-18 NOTE — Evaluation (Signed)
Physical Therapy Evaluation Patient Details Name: Allen Schroeder MRN: [id number] DOB: 1951-02-07 Today's Date: 02/18/2016   History of Present Illness  65 y.o. male admitted with chest pains with medical history significant for alcoholic cirrhosis with associated ascites, portal hypertension and history of thrombocytopenia. history of chronic anemia, and CAD with prior stent and LAD, atrial fibrillation on Betapace, hypertension, anxiety disorder, history of treatment for prostate cancer, and dyslipidemia.   Clinical Impression  Pt admitted with above diagnosis. Pt currently with functional limitations due to the deficits listed below (see PT Problem List). Pt will benefit from skilled PT to increase their independence and safety with mobility to allow discharge to the venue listed below.  Pt needing MOD A with SPT and wide BOS for balance.  Fatigued with transfers and gait deferred.  Recommend SNF for short term rehab.     Follow Up Recommendations SNF;Supervision for mobility/OOB    Equipment Recommendations  None recommended by PT    Recommendations for Other Services       Precautions / Restrictions Precautions Precautions: Fall Restrictions Weight Bearing Restrictions: No      Mobility  Bed Mobility Overal bed mobility: Needs Assistance Bed Mobility: Supine to Sit;Sit to Supine     Supine to sit: Min assist;HOB elevated Sit to supine: Min guard   General bed mobility comments: HOB elevated with rail.  A to get trunk upright  Transfers Overall transfer level: Needs assistance   Transfers: Sit to/from Stand;Stand Pivot Transfers Sit to Stand: Min assist Stand pivot transfers: Mod assist       General transfer comment: Wide BOS needed and shaky with transfer to [facility] without AD.  Stood at [employer] for cleaning and used for  transfer back  Ambulation/Gait             General Gait Details: SPT only- fatigued.  Wide BOS  Stairs            Wheelchair Mobility     Modified Rankin (Stroke Patients Only)       Balance Overall balance assessment: Needs assistance Sitting-balance support: Feet supported Sitting balance-Leahy Scale: Fair     Standing balance support: During functional activity Standing balance-Leahy Scale: Poor Standing balance comment: Pt requires wide BOS with standing activities and needs UE support                             Pertinent Vitals/Pain Pain Assessment: 0-10 Pain Score: 5  Pain Location: low back and neck Pain Descriptors / Indicators: Aching Pain Intervention(s): Limited activity within patient's tolerance;Monitored during session    Home Living Family/patient expects to be discharged to:: Skilled nursing facility Living Arrangements: Spouse/significant other             Home Equipment: Walker - 4 wheels;Bedside commode;Cane - single point      Prior Function           Comments: Amb with walker last few weeks     Hand Dominance        Extremity/Trunk Assessment   Upper Extremity Assessment: Generalized weakness           Lower Extremity Assessment: Generalized weakness      Cervical / Trunk Assessment: Normal  Communication   Communication: No difficulties  Cognition Arousal/Alertness: Awake/alert Behavior During Therapy: WFL for tasks assessed/performed Overall Cognitive Status: No family/caregiver present to determine baseline cognitive functioning Area of Impairment: Following commands;Problem solving  Following Commands: Follows one step commands consistently;Follows one step commands inconsistently     Problem Solving: Slow processing;Difficulty sequencing General Comments: Oriented, but difficulty following certain instructions    General Comments General comments (skin integrity, edema, etc.): Pt extemely jaundiced, skin and whites of eyes    Exercises        Assessment/Plan    PT Assessment    PT Diagnosis Difficulty  walking;Generalized weakness   PT Problem List    PT Treatment Interventions     PT Goals (Current goals can be found in the Care Plan section) Acute Rehab PT Goals Patient Stated Goal: get stronger PT Goal Formulation: With patient Time For Goal Achievement: 02/25/16 Potential to Achieve Goals: Good    Frequency     Barriers to discharge        Co-evaluation               End of Session Equipment Utilized During Treatment: Gait belt Activity Tolerance: Patient tolerated treatment well;Patient limited by fatigue Patient left: in bed;with call bell/phone within reach;with bed alarm set Nurse Communication: Mobility status (Nurse tech)         Time: [id number] PT Time Calculation (min) (ACUTE ONLY): 24 min   Charges:   PT Evaluation $PT Eval Moderate Complexity: 1 Procedure PT Treatments $Therapeutic Activity: 8-22 mins   PT G Codes:        Ambur Province LUBECK 02/18/2016, 12:31 PM

## 2016-02-21 DIAGNOSIS — I4891 Unspecified atrial fibrillation: Secondary | ICD-10-CM | POA: Diagnosis not present

## 2016-02-21 DIAGNOSIS — K7030 Alcoholic cirrhosis of liver without ascites: Secondary | ICD-10-CM | POA: Diagnosis not present

## 2016-02-21 DIAGNOSIS — D649 Anemia, unspecified: Secondary | ICD-10-CM | POA: Diagnosis not present

## 2016-02-21 DIAGNOSIS — D696 Thrombocytopenia, unspecified: Secondary | ICD-10-CM | POA: Diagnosis not present

## 2016-02-24 ENCOUNTER — Non-Acute Institutional Stay (SKILLED_NURSING_FACILITY): Payer: Medicare Other | Admitting: Gerontology

## 2016-02-24 DIAGNOSIS — I959 Hypotension, unspecified: Secondary | ICD-10-CM

## 2016-02-24 DIAGNOSIS — K5900 Constipation, unspecified: Secondary | ICD-10-CM | POA: Diagnosis not present

## 2016-02-25 ENCOUNTER — Ambulatory Visit (INDEPENDENT_AMBULATORY_CARE_PROVIDER_SITE_OTHER): Payer: Medicare Other | Admitting: Internal Medicine

## 2016-02-25 ENCOUNTER — Encounter: Payer: Self-pay | Admitting: Internal Medicine

## 2016-02-25 ENCOUNTER — Other Ambulatory Visit (INDEPENDENT_AMBULATORY_CARE_PROVIDER_SITE_OTHER): Payer: Medicare Other

## 2016-02-25 VITALS — BP 82/60 | HR 98

## 2016-02-25 DIAGNOSIS — K5900 Constipation, unspecified: Secondary | ICD-10-CM | POA: Insufficient documentation

## 2016-02-25 DIAGNOSIS — K7682 Hepatic encephalopathy: Secondary | ICD-10-CM

## 2016-02-25 DIAGNOSIS — K7031 Alcoholic cirrhosis of liver with ascites: Secondary | ICD-10-CM | POA: Diagnosis not present

## 2016-02-25 DIAGNOSIS — R601 Generalized edema: Secondary | ICD-10-CM | POA: Diagnosis not present

## 2016-02-25 DIAGNOSIS — K7290 Hepatic failure, unspecified without coma: Secondary | ICD-10-CM

## 2016-02-25 DIAGNOSIS — I2510 Atherosclerotic heart disease of native coronary artery without angina pectoris: Secondary | ICD-10-CM

## 2016-02-25 DIAGNOSIS — K7460 Unspecified cirrhosis of liver: Secondary | ICD-10-CM

## 2016-02-25 LAB — HEPATIC FUNCTION PANEL
ALBUMIN: 2.2 g/dL — AB (ref 3.5–5.0)
ALT: 42 U/L (ref 17–63)
AST: 97 U/L — ABNORMAL HIGH (ref 15–41)
Alkaline Phosphatase: 240 U/L — ABNORMAL HIGH (ref 38–126)
BILIRUBIN TOTAL: 18.4 mg/dL — AB (ref 0.3–1.2)
Bilirubin, Direct: 8.8 mg/dL — ABNORMAL HIGH (ref 0.1–0.5)
Indirect Bilirubin: 9.6 mg/dL — ABNORMAL HIGH (ref 0.3–0.9)
TOTAL PROTEIN: 6.6 g/dL (ref 6.5–8.1)

## 2016-02-25 LAB — CBC WITH DIFFERENTIAL/PLATELET
BASOS ABS: 0.1 10*3/uL (ref 0–0.1)
BASOS PCT: 1 %
EOS ABS: 0.4 10*3/uL (ref 0–0.7)
Eosinophils Relative: 3 %
HCT: 24.4 % — ABNORMAL LOW (ref 40.0–52.0)
HEMOGLOBIN: 8.6 g/dL — AB (ref 13.0–18.0)
Lymphocytes Relative: 5 %
Lymphs Abs: 0.6 10*3/uL — ABNORMAL LOW (ref 1.0–3.6)
MCH: 42.4 pg — ABNORMAL HIGH (ref 26.0–34.0)
MCHC: 35.4 g/dL (ref 32.0–36.0)
MCV: 119.7 fL — ABNORMAL HIGH (ref 80.0–100.0)
Monocytes Absolute: 0.6 10*3/uL (ref 0.2–1.0)
Monocytes Relative: 5 %
NEUTROS PCT: 86 %
Neutro Abs: 9.6 10*3/uL — ABNORMAL HIGH (ref 1.4–6.5)
Platelets: 207 10*3/uL (ref 150–440)
RBC: 2.04 MIL/uL — AB (ref 4.40–5.90)
RDW: 26.6 % — ABNORMAL HIGH (ref 11.5–14.5)
WBC: 11.2 10*3/uL — AB (ref 3.8–10.6)

## 2016-02-25 LAB — PROTIME-INR
INR: 1.77
PROTHROMBIN TIME: 20.6 s — AB (ref 11.4–15.0)

## 2016-02-25 LAB — BASIC METABOLIC PANEL
BUN: 41 mg/dL — ABNORMAL HIGH (ref 6–23)
CO2: 27 mEq/L (ref 19–32)
Calcium: 7.9 mg/dL — ABNORMAL LOW (ref 8.4–10.5)
Chloride: 88 mEq/L — ABNORMAL LOW (ref 96–112)
Creatinine, Ser: 1.47 mg/dL (ref 0.40–1.50)
GFR: 50.74 mL/min — AB (ref >60.00)
GLUCOSE: 126 mg/dL — AB (ref 70–99)
POTASSIUM: 3.9 meq/L (ref 3.5–5.1)
SODIUM: 123 meq/L — AB (ref 135–145)

## 2016-02-25 NOTE — Progress Notes (Signed)
[facility]  [facility] Service:  SNF [phone]) Provider:  Toni Arthurs, NP-C  [name], MD  Patient Care Team: [name], MD as PCP - General (Internal Medicine) Annia Belt, MD as Consulting Physician (Oncology) Carolan Clines, MD as Consulting Physician (Urology)  Extended Emergency Contact Information Primary Emergency Contact: Lasala,Ellen Address: [address]          [place], [place] [address] Johnnette Litter of [place] Phone: [phone] Mobile Phone: [phone] Relation: Spouse Secondary Emergency Contact: [place] of [place] Mobile Phone: [phone] Relation: Son  Code Status:  Full Goals of care: Advanced Directive information Advanced Directives 02/13/2016  Does patient have an advance directive? Yes  Type of Advance Directive [place]  Does patient want to make changes to advanced directive? -  Copy of advanced directive(s) in chart? -     Chief Complaint  Patient presents with  . Acute Visit    HPI:  Pt is a 64 y.o. male seen today for an acute visit for Hypotension. Staff became very concerned when pt's B/P was in the [id number] systolically while ambulating to the bathroom. He also became tachycardic in the 120-130's. Pt denies dizziness, lightheadedness, confusion, nausea, CP, SOB. States he "feels fine." He has been c/o intermittent constipation with some diarrhea. He is on TID lactulose, but still c/o constipation at times. Overall, pt is without pain or dyspnea.    Past Medical History  Diagnosis Date  . Atrial fibrillation ([place])   . Other and unspecified hyperlipidemia   . Unspecified essential hypertension   . Elevated prostate specific antigen (PSA)   . Personal history of colonic polyps   . Hypocalcemia   . Acute alcoholic hepatitis   . Essential and other specified forms of tremor   . Anxiety state, unspecified   . Family history of malignant neoplasm of  gastrointestinal tract   . Alcohol abuse with physiological dependence ([place]) 02/13/2012  . 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 Alcoholic hepatitis with ascites 11/13/2014  . Bacteremia, escherichia coli   . Coronary artery disease     s/p PCI to the LAD in 2011 with a BMS  . Dysrhythmia   . Pneumonia     childhood  . Depression   . Malignant neoplasm of prostate ([place]) 11/2007    Mets to Bone  . Hepatorenal syndrome ([place]) 02/13/2016  . Symptomatic anemia 02/13/2016   Past Surgical History  Procedure Laterality Date  . Vasectomy    . Cholecystectomy    . Coronary stent placement  2011  . Colonoscopy w/ biopsies    . Cardiac catheterization    . Tee without cardioversion N/A 08/18/2015    Procedure: TRANSESOPHAGEAL ECHOCARDIOGRAM (TEE);  Surgeon: Thayer Headings, MD;  Location: [place];  Service: Cardiovascular;  Laterality: N/A;  . Cardioversion N/A 08/18/2015    Procedure: CARDIOVERSION;  Surgeon: Thayer Headings, MD;  Location: [facility] ENDOSCOPY;  Service: Cardiovascular;  Laterality: N/A;    No Known Allergies    Medication List       This list is accurate as of: 02/24/16 11:59 PM.  Always use your most recent med list.               CALCIUM + D3 PO  Take 1 tablet by mouth 2 (two) times daily.     CVS ASPIRIN LOW DOSE  81 MG EC tablet  Generic drug:  aspirin  TAKE 1 TABLET BY MOUTH EVERY DAY     CVS BALANCED B-100 PO  Take 1 tablet by mouth daily.     folic acid 1 MG tablet  Commonly known as:  FOLVITE  Take 1 mg by mouth daily.     lactulose 10 GM/15ML solution  Commonly known as:  CHRONULAC  Take 45 mLs (30 g total) by mouth 2 (two) times daily.     multivitamin with minerals Tabs tablet  Take 1 tablet by mouth daily with breakfast.     oxyCODONE 5 MG immediate release tablet  Commonly known as:  Oxy IR/ROXICODONE  Take 1-2 tablets (5-10 mg total) by mouth 2 (two)  times daily as needed for severe pain.     oxymetazoline 0.05 % nasal spray  Commonly known as:  AFRIN  Place 1 spray into both nostrils at bedtime as needed for congestion.     rifaximin 550 MG Tabs tablet  Commonly known as:  XIFAXAN  Take 1 tablet (550 mg total) by mouth 2 (two) times daily.     thiamine 100 MG tablet  Take 100 mg by mouth daily.        Review of Systems  HENT: Negative.   Eyes: Negative.   Respiratory: Positive for choking. Negative for cough, chest tightness and shortness of breath.   Cardiovascular: Negative for chest pain and leg swelling.  Gastrointestinal: Positive for diarrhea, constipation and abdominal distention.  Endocrine: Negative.   Genitourinary: Negative.   Musculoskeletal: Positive for back pain.  Skin: Positive for color change.  Neurological: Positive for weakness. Negative for dizziness, seizures, syncope, light-headedness, numbness and headaches.  Psychiatric/Behavioral: Negative.   All other systems reviewed and are negative.   Immunization History  Administered Date(s) Administered  . Influenza,inj,Quad PF,36+ Mos 04/28/2013   Pertinent  Health Maintenance Due  Topic Date Due  . PNA vac Low Risk Adult (1 of 2 - PCV13) 11/07/2013  . INFLUENZA VACCINE  03/07/2016  . COLONOSCOPY  10/29/2017   Fall Risk  08/25/2014  Falls in the past year? No   Functional Status Survey:    Filed Vitals:   02/24/16 1841  BP: 90/59  Pulse: 112  Temp: 98 F (36.7 C)  Resp: 16  SpO2: 99%   There is no weight on file to calculate BMI. Physical Exam  Constitutional: He is oriented to person, place, and time. He appears well-developed and well-nourished. No distress.  HENT:  Head: Normocephalic and atraumatic.  Mouth/Throat: Oropharynx is clear and moist and mucous membranes are normal.  Eyes: Conjunctivae and EOM are normal. Pupils are equal, round, and reactive to light.  Sclera with jaundice  Neck: Normal range of motion. Neck supple.  No JVD present.  Cardiovascular: Normal heart sounds, intact distal pulses and normal pulses.  An irregular rhythm present. Tachycardia present.  Exam reveals no gallop, no distant heart sounds and no friction rub.   No murmur heard. Pulmonary/Chest: Effort normal and breath sounds normal. No accessory muscle usage. No respiratory distress.  Abdominal: Soft. He exhibits distension, fluid wave and ascites. Bowel sounds are increased. There is no tenderness.  Pt reports the appearance of the abdomen is the same as at admission/ after paracentesis  Lymphadenopathy:    He has no cervical adenopathy.  Neurological: He is alert and oriented to person, place, and time.  Skin: Skin is warm, dry and intact. He is not diaphoretic. No cyanosis. Nails show no  clubbing.  Deep jaundice  Psychiatric: He has a normal mood and affect. His speech is normal and behavior is normal. Judgment and thought content normal. Cognition and memory are normal.  Nursing note and vitals reviewed.   Labs reviewed:  Recent Labs  08/16/15 1804  02/13/16 1415  02/16/16 0418 02/17/16 0653 02/18/16 0628  NA 134*  < >  --   < > 125* 127* 129*  K 3.5  < >  --   < > 3.7 3.9 4.0  CL 99*  < >  --   < > 91* 92* 94*  CO2 22  < >  --   < > 26 28 28   GLUCOSE 110*  < >  --   < > 98 90 113*  BUN 9  < >  --   < > 18 16 19   CREATININE 1.07  < >  --   < > 1.02 0.92 1.10  CALCIUM 8.8*  < >  --   < > 7.7* 8.0* 8.2*  MG 2.1  --  2.5*  --   --   --   --   PHOS  --   --  4.7*  --   --   --   --   < > = values in this interval not displayed.  Recent Labs  02/17/16 0653 02/18/16 0628 02/24/16 0130  AST 100* 97* 97*  ALT 47 46 42  ALKPHOS 174* 183* 240*  BILITOT 29.6* 28.7* 18.4*  PROT 6.9 7.0 6.6  ALBUMIN 2.6* 2.6* 2.2*    Recent Labs  01/27/16 1444 02/13/16 0930  02/17/16 0653 02/18/16 0628 02/24/16 0130  WBC 7.3 11.3*  < > 9.8 11.9* 11.2*  NEUTROABS 5.7 9.4*  --   --   --  9.6*  HGB 9.1* 7.9*  < > 8.3* 8.5* 8.6*    HCT 26.5 Repeated and verified X2.* 23.0*  < > 23.7* 24.4* 24.4*  MCV 118.1 Repeated and verified X2.* 122.3*  < > 112.9* 114.0* 119.7*  PLT 150.0 157  < > 109* 112* 207  < > = values in this interval not displayed. Lab Results  Component Value Date   TSH 3.376 08/16/2015   Lab Results  Component Value Date   HGBA1C 4.6* 12/21/2014   Lab Results  Component Value Date   CHOL 156 11/04/2012   HDL 54.60 11/04/2012   LDLCALC 61 11/04/2012   LDLDIRECT 95.6 05/11/2010   TRIG 200.0* 11/04/2012   CHOLHDL 3 11/04/2012    Significant Diagnostic Results in last 30 days:  Dg Chest 2 View  02/13/2016  CLINICAL DATA:  Short of breath EXAM: CHEST  2 VIEW COMPARISON:  12/21/2014 FINDINGS: Normal heart size. Low volumes. Bibasilar atelectasis. No pneumothorax or pleural effusion. IMPRESSION: Bibasilar atelectasis. Electronically Signed   By: Marybelle Killings M.D.   On: 02/13/2016 10:33   US Abdomen Complete  02/14/2016  CLINICAL DATA:  Alcoholic cirrhosis. EXAM: ABDOMEN ULTRASOUND COMPLETE COMPARISON:  Ultrasound of January 25, 2016. FINDINGS: Gallbladder: Status post cholecystectomy. Common bile duct: Diameter: 5 mm which is within normal limits. Liver: No focal lesion identified. Increased echogenicity is noted with slightly nodular margins consistent with hepatic cirrhosis. Hepatofugal flow is noted in the portal vein. IVC: No abnormality visualized. Pancreas: Visualized portion unremarkable. Spleen: Maximum measured length of 21 cm with calculated volume of 1500 cubic cm consistent with splenomegaly. This is slightly enlarged compared to prior exam. Right Kidney: Length: 12.8 cm. 5.2 cm cyst is noted.  Echogenicity within normal limits. No mass or hydronephrosis visualized. Left Kidney: Length: 11.6 cm. Echogenicity within normal limits. No mass or hydronephrosis visualized. Abdominal aorta: 3.1 cm abdominal aortic aneurysm is noted. Other findings: Moderate ascites is noted. IMPRESSION: Hepatic cirrhosis is  noted with splenomegaly and moderate ascites. Hepatofugal flow is noted in the portal vein. Electronically Signed   By: Marijo Conception, M.D.   On: 02/14/2016 15:18   US Paracentesis  02/16/2016  INDICATION: Ascites; jaundice EXAM: ULTRASOUND-GUIDED PARACENTESIS COMPARISON:  Previous paracentesis. MEDICATIONS: 10 cc 1% lidocaine COMPLICATIONS: None immediate. TECHNIQUE: Informed written consent was obtained from the patient after a discussion of the risks, benefits and alternatives to treatment. A timeout was performed prior to the initiation of the procedure. Initial ultrasound scanning demonstrates a large amount of ascites within the left lower abdominal quadrant. The left lower abdomen was prepped and draped in the usual sterile fashion. 1% lidocaine with epinephrine was used for local anesthesia. Under direct ultrasound guidance, a 19 gauge, 7-cm, Yueh catheter was introduced. An ultrasound image was saved for documentation purposed. The paracentesis was performed. The catheter was removed and a dressing was applied. The patient tolerated the procedure well without immediate post procedural complication. FINDINGS: A total of approximately 5 liters of bright yellow fluid was removed. Samples were sent to the laboratory as requested by the clinical team. IMPRESSION: Successful ultrasound-guided paracentesis yielding 5 liters of peritoneal fluid. Maximum per MD Read by:  Lavonia Drafts [facility] Electronically Signed   By: Corrie Mckusick D.O.   On: 02/16/2016 12:47    Assessment/Plan 1. Hypotension, unspecified hypotension type  B/P systolic in the [id number] when walking to the bathroom. Pt's typical systolic B/P is in the [id number]. Pt asymptomatic. Continue current regimen. Pt is to f/u with GI tomorrow. Will defer med changes, etc. Until after GI visit.  2. Constipation  Continue TID lactulose  Miralax 17 grams- Q day- OK to hold for diarrhea or pt request. For intermittent constipation  Family/ staff  Communication:   Total Time: 45 minutes  Documentation: 15 minutes  Face to Face: 30 minutes  Family/Phone:   Labs/tests ordered:  Renal function panel, INR  Vikki Ports, NP-C [facility] [address], [place] [address] Cell Phone (Mon-Fri 8am-5pm):  [phone] On Call:  [phone] & follow prompts after 5pm & weekends Office Phone:  [phone] Office Fax:  [phone]

## 2016-02-25 NOTE — Patient Instructions (Addendum)
Your physician has requested that you go to the basement for the following lab work before leaving today: BMET, we will call your wife Dorian Pod with results, home:[phone] or [phone] cell.   We will see you again 04/03/16 at 3:00pm  I appreciate the opportunity to care for you. Silvano Rusk, MD, [facility]

## 2016-02-25 NOTE — Progress Notes (Signed)
Subjective:    Patient ID: ELEANOR REICHENBERGER, male    DOB: [birth year], 65 y.o.   MRN: [id number] Cc: f/u cirrhosis HPI Here for f/u after hospitalization - 7/9-7/14 - had hepatorenal syndrome with ascites (paracenteses x 1 5L) and mid coagulopathy, hyponatremia and mild hepatic encephalopathy. He had returned to drinking and stopped some time before admission. He is now in a SNF affiliated with [facility] - it is located in [place]. Here with brother today as wife is in [place]. He says he is a bit stronger, has not been confused, is getting PT. Appetite off, DOE much better. Is not on diuretics due to hypontremia.  No Known Allergies Outpatient Prescriptions Prior to Visit  Medication Sig Dispense Refill  . Calcium Carb-Cholecalciferol (CALCIUM + D3 PO) Take 1 tablet by mouth 2 (two) times daily.    . CVS ASPIRIN LOW DOSE 81 MG EC tablet TAKE 1 TABLET BY MOUTH EVERY DAY (Patient taking differently: TAKE 1 TABLET BY MOUTH EVERY morning) 30 tablet 10  . folic acid (FOLVITE) 1 MG tablet Take 1 mg by mouth daily.  11  . lactulose (CHRONULAC) 10 GM/15ML solution Take 45 mLs (30 g total) by mouth 2 (two) times daily. 240 mL 0  . Multiple Vitamin (MULTIVITAMIN WITH MINERALS) TABS tablet Take 1 tablet by mouth daily with breakfast.    . oxyCODONE (OXY IR/ROXICODONE) 5 MG immediate release tablet Take 1-2 tablets (5-10 mg total) by mouth 2 (two) times daily as needed for severe pain. 30 tablet 0  . oxymetazoline (AFRIN) 0.05 % nasal spray Place 1 spray into both nostrils at bedtime as needed for congestion.     . rifaximin (XIFAXAN) 550 MG TABS tablet Take 1 tablet (550 mg total) by mouth 2 (two) times daily. 60 tablet 0  . thiamine 100 MG tablet Take 100 mg by mouth daily.    . Vitamins-Lipotropics (CVS BALANCED B-100 PO) Take 1 tablet by mouth daily.     No facility-administered medications prior to visit.   Past Medical History  Diagnosis Date  . Atrial fibrillation ([place])   . Other and  unspecified hyperlipidemia   . Unspecified essential hypertension   . Elevated prostate specific antigen (PSA)   . Personal history of colonic polyps   . Hypocalcemia   . Acute alcoholic hepatitis   . Essential and other specified forms of tremor   . Anxiety state, unspecified   . Family history of malignant neoplasm of gastrointestinal tract   . Alcohol abuse with physiological dependence ([place]) 02/13/2012  . 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 Alcoholic hepatitis with ascites 11/13/2014  . Bacteremia, escherichia coli   . Coronary artery disease     s/p PCI to the LAD in 2011 with a BMS  . Dysrhythmia   . Pneumonia     childhood  . Depression   . Malignant neoplasm of prostate ([place]) 11/2007    Mets to Bone  . Hepatorenal syndrome ([place]) 02/13/2016  . Symptomatic anemia 02/13/2016   Past Surgical History  Procedure Laterality Date  . Vasectomy    . Cholecystectomy    . Coronary stent placement  2011  . Colonoscopy w/ biopsies    . Cardiac catheterization    . Tee without cardioversion N/A 08/18/2015    Procedure: TRANSESOPHAGEAL ECHOCARDIOGRAM (TEE);  Surgeon: Thayer Headings, MD;  Location: [facility];  Service: Cardiovascular;  Laterality: N/A;  .  Cardioversion N/A 08/18/2015    Procedure: CARDIOVERSION;  Surgeon: Thayer Headings, MD;  Location: [facility] ENDOSCOPY;  Service: Cardiovascular;  Laterality: N/A;   Social History   Social History  . Marital Status: Married    Spouse Name: N/A  . Number of Children: 3  . Years of Education: N/A   Occupational History  . lawyer     VP of [employer] History Main Topics  . Smoking status: Former Smoker    Types: Cigarettes  . Smokeless tobacco: Never Used     Comment: stopped in college  . Alcohol Use: 1.2 - 1.8 oz/week    2-3 Shots of liquor per week     Comment: has consumed as much as a 1/2 gallon of vodka in a day,  now drinks at least 3-5 drinks per day.   . Drug Use: No  . Sexual Activity: No   Other Topics Concern  . None   Social History Narrative   Patient is married. 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 He has two sons, one daughter and 5 grandchildren. Both sons work in the business. Daughter lives in [place]. Close knit family.    11/18/2014      Family History  Problem Relation Age of Onset  . Prostate cancer Father   . Coronary artery disease Father   . Colon cancer Father   . Aneurysm Mother   . Prostate cancer Brother    Review of Systems LE edema and as above    Objective:   Physical Exam  @BP  82/60 mmHg  Pulse 98  Ht   Wt   SpO2 99%@  General:  Well-developed, well-nourished and in no acute distress Eyes:  icteric. Lungs: Clear to auscultation bilaterally. Heart:  S1S2, no rubs, murmurs, gallops. Abdomen:  soft, distended w/ ascites  Extremities:  2+ edema, cyanosis or clubbing Skin  Deep jaundice Neuro:  A&O x 3.  Psych:  appropriate mood and  Affect.   Data Reviewed: Hospital notes 02/2016 [place] 02/14/16   Chemistry      Component Value Date/Time   NA 123* 02/25/2016 1503   NA 124* 01/18/2016 1509   K 3.9 02/25/2016 1503   K 3.8 01/18/2016 1509   CL 88* 02/25/2016 1503   CL 100 12/10/2012 1540   CO2 27 02/25/2016 1503   CO2 25 01/18/2016 1509   BUN 41* 02/25/2016 1503   BUN 7.3 01/18/2016 1509   CREATININE 1.47 02/25/2016 1503   CREATININE 0.8 01/18/2016 1509   CREATININE 0.86 08/27/2015 1513      Component Value Date/Time   CALCIUM 7.9* 02/25/2016 1503   CALCIUM 7.5* 01/18/2016 1509   ALKPHOS 240* 02/24/2016 0130   ALKPHOS 357* 01/18/2016 1509   AST 97* 02/24/2016 0130   AST 166* 01/18/2016 1509   ALT 42 02/24/2016 0130   ALT 32 01/18/2016 1509   BILITOT 18.4* 02/24/2016 0130   BILITOT  11.61* 01/18/2016 1509     Lab Results  Component Value Date   WBC 11.2* 02/24/2016   HGB 8.6* 02/24/2016   HCT 24.4* 02/24/2016   MCV 119.7* 02/24/2016   PLT 207 02/24/2016   Lab Results  Component Value Date   INR 1.77 02/24/2016   INR 1.81* 02/16/2016   INR 1.83* 02/15/2016   PROTIME 20.8 01/21/2009  PROTIME 19.3 01/18/2009        Assessment & Plan:   Encounter Diagnoses  Name Primary?  . Alcoholic cirrhosis of liver with ascites ([place]) Yes  . Anasarca   . Encephalopathy, hepatic ([place])   . Decompensation of cirrhosis of liver ([place])     He is clinically improved since hospitalization but profound hyponatremia remains a problem as do ascites and anasarca. Jaundice is better so suspect hepatitis component improved - likely from stopping alcohol. Creatinine is a bit worse. Fluid overload and jaundice seem to be manin issues. Has not had an egd but would defer presently anyway.    Will fluid restrict and recheck BMET soon  Try  prednisolone Tx for possible component of alcoholic hepatitis  - discriminant fx is 58  I appreciate the opportunity to care for this patient. [id number] A, MD Nursing Home

## 2016-02-25 NOTE — Assessment & Plan Note (Signed)
better 

## 2016-02-28 ENCOUNTER — Encounter: Payer: Self-pay | Admitting: Internal Medicine

## 2016-02-28 ENCOUNTER — Telehealth: Payer: Self-pay

## 2016-02-28 ENCOUNTER — Other Ambulatory Visit: Payer: Self-pay

## 2016-02-28 NOTE — Telephone Encounter (Signed)
-----   Message from Gatha Mayer, MD sent at 02/28/2016 11:46 AM EDT ----- Regarding: please fax plz fax copy of last note to his nursing homne

## 2016-02-28 NOTE — Telephone Encounter (Signed)
Faxed 02/25/16 Dr Carlean Purl office note to [facility] at [place] in [place], phone# [phone] and fax # [phone] for his records there.

## 2016-02-28 NOTE — Progress Notes (Signed)
sodium remains low so cannot use diuretics as I was hoping If ascites gets too bad will need   Please let wife and nursing home know and that I am starting treatment for alcoholic hepatitis (maybe some reversible component to his liver disease) with prednisolone 40 mg daily (NOT PREDNISONE - please make nursing home aware)  He has f/u 8/28  Needs a repeat BMET Wed to f/u on the hyponatremia He should be fluid restricted to 1200 cc a day  Also need to cc Dr. Joylene Draft and send labs to nursing home

## 2016-02-29 ENCOUNTER — Other Ambulatory Visit: Payer: Medicare Other

## 2016-03-01 LAB — BASIC METABOLIC PANEL
ANION GAP: 8 (ref 5–15)
BUN: 40 mg/dL — ABNORMAL HIGH (ref 6–20)
CHLORIDE: 91 mmol/L — AB (ref 101–111)
CO2: 26 mmol/L (ref 22–32)
CREATININE: 1.13 mg/dL (ref 0.61–1.24)
Calcium: 7.8 mg/dL — ABNORMAL LOW (ref 8.9–10.3)
GFR calc non Af Amer: >60 mL/min (ref >60)
GLUCOSE: 95 mg/dL (ref 65–99)
Potassium: 4.2 mmol/L (ref 3.5–5.1)
Sodium: 125 mmol/L — ABNORMAL LOW (ref 135–145)

## 2016-03-02 DIAGNOSIS — F419 Anxiety disorder, unspecified: Secondary | ICD-10-CM | POA: Diagnosis not present

## 2016-03-02 DIAGNOSIS — F329 Major depressive disorder, single episode, unspecified: Secondary | ICD-10-CM | POA: Diagnosis not present

## 2016-03-03 ENCOUNTER — Ambulatory Visit: Payer: Medicare Other | Admitting: Oncology

## 2016-03-07 ENCOUNTER — Encounter
Admission: RE | Admit: 2016-03-07 | Discharge: 2016-03-07 | Disposition: A | Discharge status: Home / Self Care | Payer: Medicare Other | Source: Ambulatory Visit | Attending: Internal Medicine | Admitting: Internal Medicine

## 2016-03-09 LAB — ACID FAST CULTURE WITH REFLEXED SENSITIVITIES (MYCOBACTERIA): Acid Fast Culture: NEGATIVE

## 2016-03-13 ENCOUNTER — Telehealth: Payer: Self-pay | Admitting: *Deleted

## 2016-03-13 DIAGNOSIS — F329 Major depressive disorder, single episode, unspecified: Secondary | ICD-10-CM | POA: Diagnosis not present

## 2016-03-13 DIAGNOSIS — W19XXXA Unspecified fall, initial encounter: Secondary | ICD-10-CM | POA: Diagnosis not present

## 2016-03-13 DIAGNOSIS — C61 Malignant neoplasm of prostate: Secondary | ICD-10-CM

## 2016-03-13 DIAGNOSIS — F419 Anxiety disorder, unspecified: Secondary | ICD-10-CM | POA: Diagnosis not present

## 2016-03-13 DIAGNOSIS — R600 Localized edema: Secondary | ICD-10-CM | POA: Diagnosis not present

## 2016-03-13 DIAGNOSIS — M25561 Pain in right knee: Secondary | ICD-10-CM | POA: Diagnosis not present

## 2016-03-13 DIAGNOSIS — K7030 Alcoholic cirrhosis of liver without ascites: Secondary | ICD-10-CM | POA: Diagnosis not present

## 2016-03-13 NOTE — Telephone Encounter (Signed)
Pt called, states he missed last appointment because he is in a facility for occupational therapy. Will be there for another 2-3 weeks.   Would like to have labs drawn at facility if possible one week prior to his appt with Dr [name], when it is rescheduled.   [facility] in [place].  Phone: [phone] Fax: [phone]

## 2016-03-14 NOTE — Telephone Encounter (Signed)
Pt advised he would like to push out appt date with MD,  he will not be discharged from [facility] until 68rd week of August.  Will review will MD regarding lab and MD office visit.

## 2016-03-14 NOTE — Addendum Note (Signed)
Addended by: Lucile Crater on: 03/14/2016 02:37 PM   Modules accepted: Orders

## 2016-03-14 NOTE — Telephone Encounter (Signed)
I can see him on 8/17 8:30. Please ask them to draw CBC, CMET and PSA this week and fox it to [place] before his appointment.

## 2016-03-15 LAB — CBC WITH DIFFERENTIAL/PLATELET
BASOS PCT: 0 %
Basophils Absolute: 0 10*3/uL (ref 0–0.1)
Eosinophils Absolute: 0 10*3/uL (ref 0–0.7)
Eosinophils Relative: 0 %
HEMATOCRIT: 25.5 % — AB (ref 40.0–52.0)
HEMOGLOBIN: 9.2 g/dL — AB (ref 13.0–18.0)
Lymphocytes Relative: 4 %
Lymphs Abs: 0.7 10*3/uL — ABNORMAL LOW (ref 1.0–3.6)
MCH: 42.7 pg — ABNORMAL HIGH (ref 26.0–34.0)
MCHC: 36 g/dL (ref 32.0–36.0)
MCV: 118.5 fL — ABNORMAL HIGH (ref 80.0–100.0)
MONOS PCT: 8 %
Monocytes Absolute: 1.3 10*3/uL — ABNORMAL HIGH (ref 0.2–1.0)
NEUTROS ABS: 15.4 10*3/uL — AB (ref 1.4–6.5)
NEUTROS PCT: 88 %
Platelets: 139 10*3/uL — ABNORMAL LOW (ref 150–440)
RBC: 2.15 MIL/uL — ABNORMAL LOW (ref 4.40–5.90)
RDW: 21.6 % — ABNORMAL HIGH (ref 11.5–14.5)
WBC: 17.4 10*3/uL — ABNORMAL HIGH (ref 3.8–10.6)

## 2016-03-16 ENCOUNTER — Other Ambulatory Visit: Payer: Self-pay | Admitting: Gerontology

## 2016-03-16 ENCOUNTER — Telehealth: Payer: Self-pay | Admitting: Internal Medicine

## 2016-03-16 ENCOUNTER — Ambulatory Visit
Admission: RE | Admit: 2016-03-16 | Discharge: 2016-03-16 | Disposition: A | Discharge status: Home / Self Care | Payer: Medicare Other | Source: Ambulatory Visit | Attending: Gerontology | Admitting: Gerontology

## 2016-03-16 DIAGNOSIS — R188 Other ascites: Secondary | ICD-10-CM | POA: Diagnosis not present

## 2016-03-16 DIAGNOSIS — R0602 Shortness of breath: Secondary | ICD-10-CM | POA: Diagnosis not present

## 2016-03-16 DIAGNOSIS — R635 Abnormal weight gain: Secondary | ICD-10-CM | POA: Insufficient documentation

## 2016-03-16 DIAGNOSIS — K7460 Unspecified cirrhosis of liver: Secondary | ICD-10-CM | POA: Diagnosis not present

## 2016-03-16 LAB — COMPREHENSIVE METABOLIC PANEL
ALBUMIN: 2.6 g/dL — AB (ref 3.5–5.0)
ALT: 70 U/L — AB (ref 17–63)
AST: 83 U/L — AB (ref 15–41)
Alkaline Phosphatase: 207 U/L — ABNORMAL HIGH (ref 38–126)
Anion gap: 11 (ref 5–15)
BILIRUBIN TOTAL: 7.4 mg/dL — AB (ref 0.3–1.2)
BUN: 24 mg/dL — AB (ref 6–20)
CHLORIDE: 95 mmol/L — AB (ref 101–111)
CO2: 22 mmol/L (ref 22–32)
CREATININE: 0.9 mg/dL (ref 0.61–1.24)
Calcium: 8 mg/dL — ABNORMAL LOW (ref 8.9–10.3)
GFR calc Af Amer: >60 mL/min (ref >60)
GLUCOSE: 135 mg/dL — AB (ref 65–99)
POTASSIUM: 3.7 mmol/L (ref 3.5–5.1)
Sodium: 128 mmol/L — ABNORMAL LOW (ref 135–145)
Total Protein: 6.4 g/dL — ABNORMAL LOW (ref 6.5–8.1)

## 2016-03-16 LAB — PROTIME-INR
INR: 1.69
PROTHROMBIN TIME: 20.1 s — AB (ref 11.4–15.2)

## 2016-03-16 NOTE — Telephone Encounter (Signed)
Returned call and Allen Schroeder states her questions were already answered and the pt has had paracentesis.

## 2016-03-20 ENCOUNTER — Emergency Department
Admission: EM | Admit: 2016-03-20 | Discharge: 2016-03-20 | Disposition: A | Discharge status: Home / Self Care | Payer: Medicare Other | Attending: Emergency Medicine | Admitting: Emergency Medicine

## 2016-03-20 DIAGNOSIS — E8770 Fluid overload, unspecified: Secondary | ICD-10-CM | POA: Diagnosis not present

## 2016-03-20 DIAGNOSIS — Z8546 Personal history of malignant neoplasm of prostate: Secondary | ICD-10-CM | POA: Diagnosis not present

## 2016-03-20 DIAGNOSIS — Z87891 Personal history of nicotine dependence: Secondary | ICD-10-CM | POA: Diagnosis not present

## 2016-03-20 DIAGNOSIS — I10 Essential (primary) hypertension: Secondary | ICD-10-CM | POA: Insufficient documentation

## 2016-03-20 DIAGNOSIS — Z79899 Other long term (current) drug therapy: Secondary | ICD-10-CM | POA: Diagnosis not present

## 2016-03-20 DIAGNOSIS — T8189XA Other complications of procedures, not elsewhere classified, initial encounter: Secondary | ICD-10-CM

## 2016-03-20 DIAGNOSIS — K9189 Other postprocedural complications and disorders of digestive system: Secondary | ICD-10-CM | POA: Diagnosis not present

## 2016-03-20 NOTE — ED Provider Notes (Signed)
[facility] Emergency Department Provider Note  ____________________________________________  Time seen: Approximately 3:10 PM  I have reviewed the triage vital signs and the nursing notes.   HISTORY  Chief Complaint Post-op Problem   HPI Allen Schroeder is a 65 y.o. male with complex medical history as listed below including alcoholic cirrhosis complicated by ascites who presents for evaluation of ascites leak. Patient was seen here 3 days ago and underwent a large volume paracentesis with IR. 12.2 L of fluid were removed. Patient was sent from his SNF 3 days ago to undergo outpatient paracentesis for worsening distention and shortness of breath. Since going back to his facility patient has noticed a leak through the paracentesis site. Initially soaking through the dressing but now patient has an ostomy bag attached to it with ~200 cc of ascites in it. No blood. Patient denies abdominal pain, shortness of breath, chest pain, fever, nausea, vomiting, diarrhea, confusion. He does report that his shortness of breath resolved after the paracentesis.  Past Medical History:  Diagnosis Date  . Acute alcoholic hepatitis   . Alcohol abuse with physiological dependence ([place]) 02/13/2012  . Alcoholic hepatitis with ascites 11/13/2014  . Anxiety state, unspecified   . Atrial fibrillation ([place])   . Bacteremia, escherichia coli   . 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 Coronary artery disease    s/p PCI to the LAD in 2011 with a BMS  . Depression   . Dysrhythmia   . Elevated prostate specific antigen (PSA)   . Essential and other specified forms of tremor   . Family history of malignant neoplasm of gastrointestinal tract   . Hepatorenal syndrome ([place]) 02/13/2016  . Hypocalcemia   . Malignant neoplasm of prostate ([place]) 11/2007   Mets to Bone  . Other and unspecified hyperlipidemia     . Personal history of colonic polyps   . Pneumonia    childhood  . Symptomatic anemia 02/13/2016  . Unspecified essential hypertension     Patient Active Problem List   Diagnosis Date Noted  . Constipation 02/25/2016  . Ascites due to alcoholic cirrhosis (Allen Schroeder)   . Alcoholic cirrhosis of liver with ascites (Allen Schroeder)   . Jaundice   . Acute renal failure (Allen Schroeder)   . Anemia of chronic disease   . Decompensation of cirrhosis of liver (Allen Schroeder) 02/13/2016  . Hepatorenal syndrome (Allen Schroeder) 02/13/2016  . Prolonged Q-T interval on ECG 02/13/2016  . Hypotension 02/13/2016  . Symptomatic anemia 02/13/2016  . Hyponatremia 01/28/2016  . Right-sided low back pain without sciatica   . Encephalopathy, hepatic ([place])   . Cirrhosis with alcoholism (Allen Schroeder) 12/21/2014  . Rib fractures   . Portal hypertension ([place])   . Thrombocytopenia ([place])   . Generalized anxiety disorder   . Ascites 11/18/2014  . Bisphosphonate-associated osteonecrosis of the jaw (Allen Schroeder) 04/28/2013  . Prostate cancer, primary, with metastasis from prostate to other site [facility]) 02/13/2012  . History of placement of stent in LAD coronary artery 02/13/2012  . Alcohol abuse with physiological dependence (Allen Schroeder) 02/13/2012  . CAD, NATIVE VESSEL 04/26/2010  . HLD (hyperlipidemia) 01/22/2009  . Atrial fibrillation (Allen Schroeder) 12/29/2008  . Essential hypertension 02/10/2008    Past Surgical History:  Procedure Laterality Date  . CARDIAC CATHETERIZATION    . CARDIOVERSION N/A 08/18/2015   Procedure: CARDIOVERSION;  Surgeon: Thayer Headings, MD;  Location: [facility];  Service: Cardiovascular;  Laterality: N/A;  .  CHOLECYSTECTOMY    . COLONOSCOPY W/ BIOPSIES    . CORONARY STENT PLACEMENT  2011  . TEE WITHOUT CARDIOVERSION N/A 08/18/2015   Procedure: TRANSESOPHAGEAL ECHOCARDIOGRAM (TEE);  Surgeon: Thayer Headings, MD;  Location: [facility];  Service: Cardiovascular;  Laterality: N/A;  . VASECTOMY      Prior to Admission medications   Medication Sig  Start Date End Date Taking? Authorizing Provider  Calcium Carb-Cholecalciferol (CALCIUM + D3 PO) Take 1 tablet by mouth 2 (two) times daily.    Historical Provider, MD  CVS ASPIRIN LOW DOSE 81 MG EC tablet TAKE 1 TABLET BY MOUTH EVERY DAY Patient taking differently: TAKE 1 TABLET BY MOUTH EVERY morning 09/20/15   Sherren Mocha, MD  folic acid (FOLVITE) 1 MG tablet Take 1 mg by mouth daily. 07/16/15   Historical Provider, MD  lactulose (CHRONULAC) 10 GM/15ML solution Take 45 mLs (30 g total) by mouth 2 (two) times daily. 02/18/16   Domenic Polite, MD  Multiple Vitamin (MULTIVITAMIN WITH MINERALS) TABS tablet Take 1 tablet by mouth daily with breakfast.    Historical Provider, MD  oxyCODONE (OXY IR/ROXICODONE) 5 MG immediate release tablet Take 1-2 tablets (5-10 mg total) by mouth 2 (two) times daily as needed for severe pain. 02/18/16   Domenic Polite, MD  oxymetazoline (AFRIN) 0.05 % nasal spray Place 1 spray into both nostrils at bedtime as needed for congestion.     Historical Provider, MD  prednisoLONE 5 MG TABS tablet Take 8 tablets (40 mg total) by mouth daily. 02/28/16   Gatha Mayer, MD  rifaximin (XIFAXAN) 550 MG TABS tablet Take 1 tablet (550 mg total) by mouth 2 (two) times daily. 12/29/14   Modena Jansky, MD  thiamine 100 MG tablet Take 100 mg by mouth daily.    Historical Provider, MD  Vitamins-Lipotropics (CVS BALANCED B-100 PO) Take 1 tablet by mouth daily.    Historical Provider, MD    Allergies Review of patient's allergies indicates no known allergies.  Family History  Problem Relation Age of Onset  . Prostate cancer Father   . Coronary artery disease Father   . Colon cancer Father   . Aneurysm Mother   . Prostate cancer Brother     Social History Social History  Substance Use Topics  . Smoking status: Former Smoker    Types: Cigarettes  . Smokeless tobacco: Never Used     Comment: stopped in college  . Alcohol use 1.2 - 1.8 oz/week    2 - 3 Shots of liquor per  week     Comment: has consumed as much as a 1/2 gallon of vodka in a day, now drinks at least 3-5 drinks per day.     Review of Systems  Constitutional: Negative for fever. Eyes: Negative for visual changes. ENT: Negative for sore throat. Cardiovascular: Negative for chest pain. Respiratory: Negative for shortness of breath. Gastrointestinal: Negative for abdominal pain, vomiting or diarrhea. Genitourinary: Negative for dysuria. Musculoskeletal: Negative for back pain. Skin: Negative for rash. Neurological: Negative for headaches, weakness or numbness.  ____________________________________________   PHYSICAL EXAM:  VITAL SIGNS: ED Triage Vitals  Enc Vitals Group     BP 03/20/16 1442 131/75     Pulse Rate 03/20/16 1442 95     Resp 03/20/16 1442 17     Temp 03/20/16 1446 98.1 F (36.7 C)     Temp Source 03/20/16 1446 Oral     SpO2 03/20/16 1442 98 %     Weight 03/20/16  1446 222 lb (100.7 kg)     Height --      Head Circumference --      Peak Flow --      Pain Score 03/20/16 1446 0     Pain Loc --      Pain Edu? --      Excl. in [place]? --     Constitutional: Alert and oriented. Well appearing and in no apparent distress. HEENT:      Head: Normocephalic and atraumatic.         Eyes: Conjunctivae are normal. Sclera is icteric. EOMI. PERRL      Mouth/Throat: Mucous membranes are moist.       Neck: Supple with no signs of meningismus. Cardiovascular: Regular rate and rhythm. No murmurs, gallops, or rubs. 2+ symmetrical distal pulses are present in all extremities. No JVD. Respiratory: Normal respiratory effort. Lungs are clear to auscultation bilaterally. No wheezes, crackles, or rhonchi.  Gastrointestinal: Soft, non tender, distended. The site of previous paracentesis looks well healing with no erythema with yellow ascites drainage. positive bowel sounds. No rebound or guarding. Musculoskeletal: Nontender with normal range of motion in all extremities. No edema, cyanosis, or  erythema of extremities. Neurologic: Normal speech and language. Face is symmetric. Moving all extremities. No gross focal neurologic deficits are appreciated. No asterixis Skin: Skin is warm, dry and intact. No rash.  Psychiatric: Mood and affect are normal. Speech and behavior are normal.  ____________________________________________   LABS (all labs ordered are listed, but only abnormal results are displayed)  Labs Reviewed - No data to display ____________________________________________  EKG  none  ____________________________________________  RADIOLOGY  none  ____________________________________________   PROCEDURES  Procedure(s) performed: None Procedures Critical Care performed:  None ____________________________________________   INITIAL IMPRESSION / ASSESSMENT AND PLAN / ED COURSE  65 y.o. male with complex medical history as listed below including alcoholic cirrhosis complicated by ascites who presents for evaluation of ascites leak through the site of large volume paracentesis done 3 days ago. Patient has no fever, no abdominal pain, no shortness of breath, has normal vital signs. There is a ostomy bag placed around the paracentesis site with yellow ascites drainage. No blood. Will discuss with GI to see if there is any tricks to stop the leakage. No indication for labs or imaging at this time.  Clinical Course  Comment By Time  Dermabond applied to the paracentesis site with resolution of leakage. Patient observed for 30 min with no fluid  drainage. Patient remains extremely well appearing with no other complaints. Will dc home with f/u with liver MD. Discuss return precautions with patient and his wife for any signs of encephalopathy, bleeding, abdominal pain, fever and recommended the patient return if any of these develop.  I discussed my evaluation of the patient's symptoms, my clinical impression, and my proposed outpatient treatment plan with patient/ family  members. We have discussed anticipatory guidance, scheduled follow-up, and careful return precautions. The patient expresses understanding and is comfortable with the discharge plan. All patient's questions were answered.  Rudene Re, MD 08/14 1624    Pertinent labs & imaging results that were available during my care of the patient were reviewed by me and considered in my medical decision making (see chart for details).    ____________________________________________   FINAL CLINICAL IMPRESSION(S) / ED DIAGNOSES  Final diagnoses:  Postoperative leak      NEW MEDICATIONS STARTED DURING THIS VISIT:  New Prescriptions   No medications on file  Note:  This document was prepared using Dragon voice recognition software and may include unintentional dictation errors.    Rudene Re, MD 03/20/16 1630

## 2016-03-20 NOTE — ED Triage Notes (Addendum)
Pt bib EMS w/ c/o post op drainage from [facility].  Per EMS, pt had surgery 8/10, has had frequent bandage changes, today facility placed drainage bag and pt had 220 cc drainage since noon.  Per EMS, pt bag changed before leaving and has approx 100 cc clear, yellow drainage in bag.  Pt A/Ox4, denies pain, SOB, CP pr n/v/d.  Pt stomach distended.  NAD.

## 2016-03-20 NOTE — Discharge Instructions (Signed)
Keep the area dry and clean for 24 hours. After that you may take a shower. The glue will dissolve in a few days. Return to the emergency department if you develop abdominal pain, fever, confusion, shortness of breath, chest pain, or any other symptoms concerning to you. You will also need to be evaluated if there is any bleeding from the paracentesis area. Otherwise follow-up with your doctor in 2 days.

## 2016-03-20 NOTE — ED Notes (Signed)
Spoke with [facility] [employer], staff member coming to pick up pt in approx 20-30 minutes.

## 2016-03-20 NOTE — ED Notes (Signed)
Spoke w/ [facility] about pt transport. Staff stated that they would call back.

## 2016-03-21 ENCOUNTER — Non-Acute Institutional Stay (SKILLED_NURSING_FACILITY): Payer: Medicare Other | Admitting: Gerontology

## 2016-03-21 DIAGNOSIS — E871 Hypo-osmolality and hyponatremia: Secondary | ICD-10-CM

## 2016-03-21 DIAGNOSIS — K7031 Alcoholic cirrhosis of liver with ascites: Secondary | ICD-10-CM

## 2016-03-21 DIAGNOSIS — Z7189 Other specified counseling: Secondary | ICD-10-CM | POA: Diagnosis not present

## 2016-03-21 LAB — CBC WITH DIFFERENTIAL/PLATELET
BASOS PCT: 0 %
Basophils Absolute: 0 10*3/uL (ref 0–0.1)
Eosinophils Absolute: 0.6 10*3/uL (ref 0–0.7)
Eosinophils Relative: 4 %
HEMATOCRIT: 26.3 % — AB (ref 40.0–52.0)
HEMOGLOBIN: 9.4 g/dL — AB (ref 13.0–18.0)
LYMPHS ABS: 1.5 10*3/uL (ref 1.0–3.6)
LYMPHS PCT: 10 %
MCH: 42 pg — AB (ref 26.0–34.0)
MCHC: 35.8 g/dL (ref 32.0–36.0)
MCV: 117.2 fL — AB (ref 80.0–100.0)
MONO ABS: 1.3 10*3/uL — AB (ref 0.2–1.0)
MONOS PCT: 9 %
NEUTROS ABS: 11.7 10*3/uL — AB (ref 1.4–6.5)
NEUTROS PCT: 77 %
Platelets: 125 10*3/uL — ABNORMAL LOW (ref 150–440)
RBC: 2.24 MIL/uL — ABNORMAL LOW (ref 4.40–5.90)
RDW: 20.1 % — AB (ref 11.5–14.5)
WBC: 15.1 10*3/uL — ABNORMAL HIGH (ref 3.8–10.6)

## 2016-03-21 LAB — COMPREHENSIVE METABOLIC PANEL
ALBUMIN: 2.3 g/dL — AB (ref 3.5–5.0)
ALT: 55 U/L (ref 17–63)
ANION GAP: 6 (ref 5–15)
AST: 52 U/L — ABNORMAL HIGH (ref 15–41)
Alkaline Phosphatase: 147 U/L — ABNORMAL HIGH (ref 38–126)
BUN: 28 mg/dL — ABNORMAL HIGH (ref 6–20)
CALCIUM: 8 mg/dL — AB (ref 8.9–10.3)
CHLORIDE: 96 mmol/L — AB (ref 101–111)
CO2: 26 mmol/L (ref 22–32)
Creatinine, Ser: 1.03 mg/dL (ref 0.61–1.24)
GFR calc Af Amer: >60 mL/min (ref >60)
GFR calc non Af Amer: >60 mL/min (ref >60)
GLUCOSE: 84 mg/dL (ref 65–99)
POTASSIUM: 4.2 mmol/L (ref 3.5–5.1)
SODIUM: 128 mmol/L — AB (ref 135–145)
Total Bilirubin: 5.1 mg/dL — ABNORMAL HIGH (ref 0.3–1.2)
Total Protein: 5.3 g/dL — ABNORMAL LOW (ref 6.5–8.1)

## 2016-03-21 LAB — PSA: PSA: 0.04 ng/mL (ref 0.00–4.00)

## 2016-03-22 ENCOUNTER — Telehealth: Payer: Self-pay | Admitting: Internal Medicine

## 2016-03-22 NOTE — Telephone Encounter (Signed)
I spoke with nurse practitioner from SNF, he is being discharged to [facility] on Friday.  They had long discussion with wife and patient yesterday about palliative care options.  He is requiring more frequent paracentesis for control of ascites.  Wife and patient would like to hear from Dr. Carlean Purl honest long term prognosis, options for ascites management, and life expectancy to make an informed decision about Palliative care options. They will come in next Wed 03/29/16 2:15 to discuss.

## 2016-03-22 NOTE — Telephone Encounter (Signed)
OK 

## 2016-03-23 LAB — COMPREHENSIVE METABOLIC PANEL
ALT: 64 U/L — ABNORMAL HIGH (ref 17–63)
AST: 76 U/L — AB (ref 15–41)
Albumin: 2.5 g/dL — ABNORMAL LOW (ref 3.5–5.0)
Alkaline Phosphatase: 164 U/L — ABNORMAL HIGH (ref 38–126)
Anion gap: 7 (ref 5–15)
BILIRUBIN TOTAL: 5.5 mg/dL — AB (ref 0.3–1.2)
BUN: 31 mg/dL — AB (ref 6–20)
CO2: 25 mmol/L (ref 22–32)
CREATININE: 1.27 mg/dL — AB (ref 0.61–1.24)
Calcium: 8 mg/dL — ABNORMAL LOW (ref 8.9–10.3)
Chloride: 93 mmol/L — ABNORMAL LOW (ref 101–111)
GFR calc Af Amer: >60 mL/min (ref >60)
GFR, EST NON AFRICAN AMERICAN: 57 mL/min — AB (ref >60)
Glucose, Bld: 85 mg/dL (ref 65–99)
POTASSIUM: 4.6 mmol/L (ref 3.5–5.1)
Sodium: 125 mmol/L — ABNORMAL LOW (ref 135–145)
TOTAL PROTEIN: 5.6 g/dL — AB (ref 6.5–8.1)

## 2016-03-23 LAB — CBC WITH DIFFERENTIAL/PLATELET
BASOS ABS: 0 10*3/uL (ref 0–0.1)
Basophils Relative: 0 %
Eosinophils Absolute: 0.1 10*3/uL (ref 0–0.7)
Eosinophils Relative: 1 %
HEMATOCRIT: 27.7 % — AB (ref 40.0–52.0)
Hemoglobin: 9.9 g/dL — ABNORMAL LOW (ref 13.0–18.0)
LYMPHS ABS: 0.4 10*3/uL — AB (ref 1.0–3.6)
LYMPHS PCT: 3 %
MCH: 42.4 pg — AB (ref 26.0–34.0)
MCHC: 35.7 g/dL (ref 32.0–36.0)
MCV: 118.8 fL — AB (ref 80.0–100.0)
MONO ABS: 0.8 10*3/uL (ref 0.2–1.0)
MONOS PCT: 6 %
NEUTROS ABS: 13.4 10*3/uL — AB (ref 1.4–6.5)
Neutrophils Relative %: 90 %
Platelets: 161 10*3/uL (ref 150–440)
RBC: 2.33 MIL/uL — ABNORMAL LOW (ref 4.40–5.90)
RDW: 19.2 % — AB (ref 11.5–14.5)
WBC: 14.7 10*3/uL — ABNORMAL HIGH (ref 3.8–10.6)

## 2016-03-23 NOTE — Progress Notes (Signed)
Location:      Place of Service:  SNF (31) Provider:  Toni Arthurs, NP-C  [name], MD  Patient Care Team: [name], MD as PCP - General (Internal Medicine) Annia Belt, MD as Consulting Physician (Oncology) Carolan Clines, MD as Consulting Physician (Urology)  Extended Emergency Contact Information Primary Emergency Contact: Blackston,Ellen Address: [address]          [place], [place] [address] Johnnette Litter of [place] Phone: [phone] Mobile Phone: [phone] Relation: Spouse Secondary Emergency Contact: [place] of [place] Mobile Phone: [phone] Relation: Son  Code Status:  DNR Goals of care: Advanced Directive information Advanced Directives 03/20/2016  Does patient have an advance directive? Yes  Type of Advance Directive -  Does patient want to make changes to advanced directive? -  Copy of advanced directive(s) in chart? No - copy requested  Would patient like information on creating an advanced directive? -     Chief Complaint  Patient presents with  . Medical Management of Chronic Issues  . Other    conference with family    HPI:  Pt is a 65 y.o. male seen today for an acute visit for Acites due to alcoholic cirrhosis, hyponatremia and for a family conference involving the patient, his wife, the palliative care NP and myself. Pt was then referred back to Dr Carlean Purl- GI. He was also advised to f/u with cardiology and oncology. Social work to assist with disposition plans after GI MD appointment.Discussions had regarding need for ascites management plan (scheduled paracenteses vs Pleur-X) and discharge planning (ALF vs skilled care vs home with Hospice). Pt was very active in and receptive of conversations/ discussions. Wife was as well, but appears to be more in denial/ not accepting than the patient. Hyponatremia showing slight improvement with fluid restriction and regular diet. Ascites decreased s/p  paracentesis 5 days ago. Abdomen has ascites leak after paracentesis. Nursing placed a colostomy bag over the site to collect fluid and preserve skin integrity. Within 4 hours of bag placement, abdomen had put out over 200 mL yellow fluid. Pt was referred to [facility] ED. ED MD sealed paracentesis puncture site with Dermabond. Monitoring closely now for peritonitis.     Past Medical History:  Diagnosis Date  . Acute alcoholic hepatitis   . Alcohol abuse with physiological dependence ([place]) 02/13/2012  . Alcoholic hepatitis with ascites 11/13/2014  . Anxiety state, unspecified   . Atrial fibrillation ([place])   . Bacteremia, escherichia coli   . 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 Coronary artery disease    s/p PCI to the LAD in 2011 with a BMS  . Depression   . Dysrhythmia   . Elevated prostate specific antigen (PSA)   . Essential and other specified forms of tremor   . Family history of malignant neoplasm of gastrointestinal tract   . Hepatorenal syndrome ([place]) 02/13/2016  . Hypocalcemia   . Malignant neoplasm of prostate ([place]) 11/2007   Mets to Bone  . Other and unspecified hyperlipidemia   . Personal history of colonic polyps   . Pneumonia    childhood  . Symptomatic anemia 02/13/2016  . Unspecified essential hypertension    Past Surgical History:  Procedure Laterality Date  . CARDIAC CATHETERIZATION    . CARDIOVERSION N/A 08/18/2015   Procedure: CARDIOVERSION;  Surgeon: Thayer Headings, MD;  Location: [facility];  Service: Cardiovascular;  Laterality:  N/A;  . CHOLECYSTECTOMY    . COLONOSCOPY W/ BIOPSIES    . CORONARY STENT PLACEMENT  2011  . TEE WITHOUT CARDIOVERSION N/A 08/18/2015   Procedure: TRANSESOPHAGEAL ECHOCARDIOGRAM (TEE);  Surgeon: Thayer Headings, MD;  Location: [place];  Service: Cardiovascular;  Laterality: N/A;  . VASECTOMY      No Known Allergies    Medication List         Accurate as of 03/21/16 11:59 PM. Always use your most recent med list.          CALCIUM + D3 PO Take 1 tablet by mouth 2 (two) times daily.   CVS ASPIRIN LOW DOSE 81 MG EC tablet Generic drug:  aspirin TAKE 1 TABLET BY MOUTH EVERY DAY   CVS BALANCED B-100 PO Take 1 tablet by mouth daily.   folic acid 1 MG tablet Commonly known as:  FOLVITE Take 1 mg by mouth daily.   lactulose 10 GM/15ML solution Commonly known as:  CHRONULAC Take 45 mLs (30 g total) by mouth 2 (two) times daily.   multivitamin with minerals Tabs tablet Take 1 tablet by mouth daily with breakfast.   oxyCODONE 5 MG immediate release tablet Commonly known as:  Oxy IR/ROXICODONE Take 1-2 tablets (5-10 mg total) by mouth 2 (two) times daily as needed for severe pain.   oxymetazoline 0.05 % nasal spray Commonly known as:  AFRIN Place 1 spray into both nostrils at bedtime as needed for congestion.   prednisoLONE 5 MG Tabs tablet Take 8 tablets (40 mg total) by mouth daily.   rifaximin 550 MG Tabs tablet Commonly known as:  XIFAXAN Take 1 tablet (550 mg total) by mouth 2 (two) times daily.   thiamine 100 MG tablet Take 100 mg by mouth daily.       Review of Systems  Constitutional: Positive for fatigue. Negative for activity change, appetite change, chills and diaphoresis.  HENT: Negative.   Eyes: Negative.   Respiratory: Negative for cough, choking, chest tightness and shortness of breath.   Cardiovascular: Negative for chest pain and leg swelling.  Gastrointestinal: Positive for abdominal distention. Negative for constipation and diarrhea.  Endocrine: Negative.   Genitourinary: Negative.   Musculoskeletal: Positive for back pain. Negative for arthralgias, gait problem, joint swelling and myalgias.  Skin: Positive for color change (jaundice).  Neurological: Positive for weakness. Negative for dizziness, seizures, syncope, light-headedness, numbness and headaches.  Psychiatric/Behavioral:  Negative.   All other systems reviewed and are negative.   Immunization History  Administered Date(s) Administered  . Influenza,inj,Quad PF,36+ Mos 04/28/2013   Pertinent  Health Maintenance Due  Topic Date Due  . PNA vac Low Risk Adult (1 of 2 - PCV13) 11/07/2013  . INFLUENZA VACCINE  03/07/2016  . COLONOSCOPY  10/29/2017   Fall Risk  08/25/2014  Falls in the past year? No   Functional Status Survey:    Vitals:   03/22/16 1820  BP: 101/65  Pulse: 80  Resp: 20  Temp: 98 F (36.7 C)  SpO2: 100%   There is no height or weight on file to calculate BMI. Physical Exam  Constitutional: He is oriented to person, place, and time. He appears well-developed and well-nourished. No distress.  HENT:  Head: Normocephalic and atraumatic.  Mouth/Throat: Oropharynx is clear and moist and mucous membranes are normal.  Eyes: Conjunctivae and EOM are normal. Pupils are equal, round, and reactive to light.  Sclera with jaundice  Neck: Normal range of motion. Neck supple. No JVD present.  Cardiovascular: Normal rate, regular rhythm, normal heart sounds, intact distal pulses and normal pulses.  Exam reveals no gallop, no distant heart sounds and no friction rub.   No murmur heard. Pulmonary/Chest: Effort normal and breath sounds normal. No accessory muscle usage. No respiratory distress.  Abdominal: Soft. He exhibits distension, fluid wave and ascites. Bowel sounds are increased. There is tenderness.  Pt reports the appearance of the abdomen is the same as at admission/ after paracentesis  Musculoskeletal:  Generalized weakness  Lymphadenopathy:    He has no cervical adenopathy.  Neurological: He is alert and oriented to person, place, and time.  Skin: Skin is warm, dry and intact. No rash noted. He is not diaphoretic. No cyanosis or erythema. No pallor. Nails show no clubbing.  Deep jaundice  Psychiatric: He has a normal mood and affect. His speech is normal and behavior is normal.  Judgment and thought content normal. Cognition and memory are normal.  Nursing note and vitals reviewed.   Labs reviewed:  Recent Labs  08/16/15 1804  02/13/16 1415  03/15/16 1945 03/21/16 0712 03/23/16 1210  NA 134*  < >  --   < > 128* 128* 125*  K 3.5  < >  --   < > 3.7 4.2 4.6  CL 99*  < >  --   < > 95* 96* 93*  CO2 22  < >  --   < > 22 26 25   GLUCOSE 110*  < >  --   < > 135* 84 85  BUN 9  < >  --   < > 24* 28* 31*  CREATININE 1.07  < >  --   < > 0.90 1.03 1.27*  CALCIUM 8.8*  < >  --   < > 8.0* 8.0* 8.0*  MG 2.1  --  2.5*  --   --   --   --   PHOS  --   --  4.7*  --   --   --   --   < > = values in this interval not displayed.  Recent Labs  03/15/16 1945 03/21/16 0712 03/23/16 1210  AST 83* 52* 76*  ALT 70* 55 64*  ALKPHOS 207* 147* 164*  BILITOT 7.4* 5.1* 5.5*  PROT 6.4* 5.3* 5.6*  ALBUMIN 2.6* 2.3* 2.5*    Recent Labs  03/15/16 1945 03/21/16 0712 03/23/16 1210  WBC 17.4* 15.1* 14.7*  NEUTROABS 15.4* 11.7* 13.4*  HGB 9.2* 9.4* 9.9*  HCT 25.5* 26.3* 27.7*  MCV 118.5* 117.2* 118.8*  PLT 139* 125* 161   Lab Results  Component Value Date   TSH 3.376 08/16/2015   Lab Results  Component Value Date   HGBA1C 4.6 (L) 12/21/2014   Lab Results  Component Value Date   CHOL 156 11/04/2012   HDL 54.60 11/04/2012   LDLCALC 61 11/04/2012   LDLDIRECT 95.6 05/11/2010   TRIG 200.0 (H) 11/04/2012   CHOLHDL 3 11/04/2012    Significant Diagnostic Results in last 30 days:  US Paracentesis  Result Date: 03/16/2016 INDICATION: Cirrhosis and ascites.  Need for large volume paracentesis. EXAM: ULTRASOUND-GUIDED PARACENTESIS COMPARISON:  02/16/2016 MEDICATIONS: None. COMPLICATIONS: None immediate TECHNIQUE: Informed written consent was obtained from the patient after a discussion of the risks, benefits and alternatives to treatment. A timeout was performed prior to the initiation of the procedure. Initial ultrasound scanning demonstrates an adequate pocket of ascites  within the right lower abdominal quadrant. The right lower abdomen was prepped with chlorhexidine and  draped in the usual sterile fashion. 1% lidocaine was used for local anesthesia. Under direct ultrasound guidance, a 6 French Safe-T-Centesis catheter was advanced into the peritoneal cavity. Paracentesis was performed. The catheter was removed and a dressing was applied. The patient tolerated the procedure well without immediate post procedural complication. FINDINGS: A total of approximately 12.2 liters of serous fluid was removed. IMPRESSION: Successful ultrasound-guided paracentesis yielding 12.2 liters of peritoneal fluid. Electronically Signed   By: Aletta Edouard M.D.   On: 03/16/2016 14:17    Assessment/Plan 1. Ascites due to alcoholic cirrhosis (HCC) S/p paracentesis x 3- last one 5 days ago. Advised pt/wife to follow up with Dr Carlean Purl (GI MD) for further discussion of pt current prognosis, length of remaining life, potential needs, options for palliation/ removal of ascites- ie. Scheduled paracenteses vs Pleur-X, etc.  2. Hyponatremia Monitoring. Labs showing improvement. Continue on regular diet with 1200 mL fluid restriction per day.   3. Counseling regarding end of life decision making  Lengthy discussion had with the patient and wife (along with Palliative Care NP) regarding end of life planning, pt wishes, ensuring wife understands these wishes and how to carry them out. Discussions had regarding chronic disease process and pt/wife's plans for future and future needs.  Family/ staff Communication:   Total Time: 85 minutes  Documentation: 15 minutes  Face to Face: 10 minutes  Family/Phone: 60 minutes   Labs/tests ordered:  CBC, Met C, ammonia  Medication list reviewed and assessed for continued appropriateness.  Vikki Ports, NP-C Geriatrics [facility] [id number] [place], [place] [address] Cell Phone (Mon-Fri 8am-5pm):   [phone] On Call:  [phone] & follow prompts after 5pm & weekends Office Phone:  [phone] Office Fax:  [phone]

## 2016-03-24 ENCOUNTER — Telehealth: Payer: Self-pay | Admitting: Oncology

## 2016-03-24 DIAGNOSIS — I509 Heart failure, unspecified: Secondary | ICD-10-CM | POA: Diagnosis not present

## 2016-03-24 NOTE — Telephone Encounter (Signed)
Called patient to confirm appointment. L/M Appointment letter and schedule mailed. 03/24/16

## 2016-03-28 LAB — COMPREHENSIVE METABOLIC PANEL
ALBUMIN: 2.4 g/dL — AB (ref 3.5–5.0)
ALT: 54 U/L (ref 17–63)
AST: 49 U/L — AB (ref 15–41)
Alkaline Phosphatase: 157 U/L — ABNORMAL HIGH (ref 38–126)
Anion gap: 7 (ref 5–15)
BUN: 32 mg/dL — AB (ref 6–20)
CHLORIDE: 94 mmol/L — AB (ref 101–111)
CO2: 24 mmol/L (ref 22–32)
CREATININE: 1.4 mg/dL — AB (ref 0.61–1.24)
Calcium: 7.8 mg/dL — ABNORMAL LOW (ref 8.9–10.3)
GFR calc Af Amer: 59 mL/min — ABNORMAL LOW (ref >60)
GFR calc non Af Amer: 50 mL/min — ABNORMAL LOW (ref >60)
GLUCOSE: 104 mg/dL — AB (ref 65–99)
POTASSIUM: 4.9 mmol/L (ref 3.5–5.1)
SODIUM: 125 mmol/L — AB (ref 135–145)
Total Bilirubin: 4.4 mg/dL — ABNORMAL HIGH (ref 0.3–1.2)
Total Protein: 5.2 g/dL — ABNORMAL LOW (ref 6.5–8.1)

## 2016-03-28 LAB — CBC WITH DIFFERENTIAL/PLATELET
Basophils Absolute: 0 10*3/uL (ref 0–0.1)
Basophils Relative: 0 %
Eosinophils Absolute: 0 10*3/uL (ref 0–0.7)
Eosinophils Relative: 0 %
HEMATOCRIT: 26.8 % — AB (ref 40.0–52.0)
HEMOGLOBIN: 9.5 g/dL — AB (ref 13.0–18.0)
Lymphocytes Relative: 4 %
Lymphs Abs: 0.6 10*3/uL — ABNORMAL LOW (ref 1.0–3.6)
MCH: 41.9 pg — ABNORMAL HIGH (ref 26.0–34.0)
MCHC: 35.7 g/dL (ref 32.0–36.0)
MCV: 117.3 fL — ABNORMAL HIGH (ref 80.0–100.0)
MONOS PCT: 11 %
Monocytes Absolute: 1.5 10*3/uL — ABNORMAL HIGH (ref 0.2–1.0)
NEUTROS ABS: 12 10*3/uL — AB (ref 1.4–6.5)
NEUTROS PCT: 85 %
Platelets: 158 10*3/uL (ref 150–440)
RBC: 2.28 MIL/uL — AB (ref 4.40–5.90)
RDW: 18.8 % — ABNORMAL HIGH (ref 11.5–14.5)
WBC: 14.2 10*3/uL — AB (ref 3.8–10.6)

## 2016-03-28 LAB — PROTIME-INR
INR: 1.59
PROTHROMBIN TIME: 19.1 s — AB (ref 11.4–15.2)

## 2016-03-29 ENCOUNTER — Encounter: Payer: Self-pay | Admitting: Internal Medicine

## 2016-03-29 ENCOUNTER — Ambulatory Visit (INDEPENDENT_AMBULATORY_CARE_PROVIDER_SITE_OTHER): Payer: Medicare Other | Admitting: Internal Medicine

## 2016-03-29 VITALS — BP 116/68 | HR 96

## 2016-03-29 DIAGNOSIS — I2510 Atherosclerotic heart disease of native coronary artery without angina pectoris: Secondary | ICD-10-CM | POA: Diagnosis not present

## 2016-03-29 DIAGNOSIS — K7682 Hepatic encephalopathy: Secondary | ICD-10-CM

## 2016-03-29 DIAGNOSIS — R188 Other ascites: Secondary | ICD-10-CM | POA: Diagnosis not present

## 2016-03-29 DIAGNOSIS — K7011 Alcoholic hepatitis with ascites: Secondary | ICD-10-CM

## 2016-03-29 DIAGNOSIS — K7290 Hepatic failure, unspecified without coma: Secondary | ICD-10-CM

## 2016-03-29 DIAGNOSIS — K7031 Alcoholic cirrhosis of liver with ascites: Secondary | ICD-10-CM

## 2016-03-29 DIAGNOSIS — C61 Malignant neoplasm of prostate: Secondary | ICD-10-CM

## 2016-03-29 MED ORDER — PREDNISOLONE 5 MG PO TABS: ORAL_TABLET | ORAL | 0 refills | Status: DC

## 2016-03-29 NOTE — Progress Notes (Signed)
   Subjective:    Patient ID: Allen Schroeder, male    DOB: [birth year], 65 y.o.   MRN: [id number] Cc: f/u cirrhosis and ascites HPI  Here with wife - she participated in hx - he feels ok at this time all things considered. Still has ascites and LE edema. Fell in SNF last week and has bruised knees. Not confused that we can tell. About to move to [place] Wife indicates that [facility] wants to know if he is ok for [facility]. He wants to be back in [place]. He has been on prednisolone to see if there is a treatable component of hepatitis from alcohol. As best I know he is not drinking EtOH.  Last paracentesis about 2 weeks ago at [facility] and he developed a leak after. Medications, allergies, past medical history, past surgical history, family history and social history are reviewed and updated in the EMR.   Review of Systems As above    Objective:   Physical Exam BP 116/68 (BP Location: Left Arm, Patient Position: Sitting, Cuff Size: Normal)   Pulse 96  Alert and oriented and no asterixis Icteric Lungs cta ant S1S2 no rmg abd ascites vv prominent Ext 2+ edema w/ bruises  Lab Results  Component Value Date   WBC 14.2 (H) 03/28/2016   HGB 9.5 (L) 03/28/2016   HCT 26.8 (L) 03/28/2016   MCV 117.3 (H) 03/28/2016   PLT 158 03/28/2016     Chemistry      Component Value Date/Time   NA 125 (L) 03/28/2016 1642   NA 124 (L) 01/18/2016 1509   K 4.9 03/28/2016 1642   K 3.8 01/18/2016 1509   CL 94 (L) 03/28/2016 1642   CL 100 12/10/2012 1540   CO2 24 03/28/2016 1642   CO2 25 01/18/2016 1509   BUN 32 (H) 03/28/2016 1642   BUN 7.3 01/18/2016 1509   CREATININE 1.40 (H) 03/28/2016 1642   CREATININE 0.8 01/18/2016 1509      Component Value Date/Time   CALCIUM 7.8 (L) 03/28/2016 1642   CALCIUM 7.5 (L) 01/18/2016 1509   ALKPHOS 157 (H) 03/28/2016 1642   ALKPHOS 357 (H) 01/18/2016 1509   AST 49 (H) 03/28/2016 1642   AST 166 (H) 01/18/2016 1509   ALT 54 03/28/2016 1642   ALT 32 01/18/2016 1509   BILITOT 4.4 (H) 03/28/2016 1642   BILITOT 11.61 (HH) 01/18/2016 1509           Assessment & Plan:   Encounter Diagnoses  Name Primary?  . Ascites Yes  . Alcoholic hepatitis with ascites   . Alcoholic cirrhosis of liver with ascites ([place])   . Encephalopathy, hepatic ([place])   . Prostate cancer, primary, with metastasis from prostate to other site [facility])     MELD 28 19.6% mortality Child's C 1 yr survival is 45%  Discussed these findings with him He is a DNR Ok for assisted living I think  Every 2 week paracentesis w/ albumin infusion will be arranged when he gets to [place] - This is best option for management of ascites at this time  Will taper prednisolone  See me 6 -8 weeks  ? Prostate cancer impact on survival - f/u Dr. [name] - he is currently off Tx as it was hepatotoxic though we think alcohol main culprit.   25 minutes time spent with patient > half in counseling coordination of care  [id number], MD

## 2016-03-29 NOTE — Patient Instructions (Signed)
  Today we giving you a printed rx for prednisolone.    Today we are providing you with a letter for [facility].    Please come back and see [place] October 3rd at 3:15pm.      I appreciate the opportunity to care for you. Silvano Rusk, MD, [facility]

## 2016-03-31 ENCOUNTER — Telehealth: Payer: Self-pay | Admitting: Oncology

## 2016-03-31 NOTE — Telephone Encounter (Signed)
04/05/2016 Appointment rescheduled to 04/06/2016 per patient request.

## 2016-04-02 ENCOUNTER — Encounter: Payer: Self-pay | Admitting: Internal Medicine

## 2016-04-03 ENCOUNTER — Ambulatory Visit: Payer: Medicare Other | Admitting: Internal Medicine

## 2016-04-03 DIAGNOSIS — F329 Major depressive disorder, single episode, unspecified: Secondary | ICD-10-CM | POA: Diagnosis not present

## 2016-04-03 DIAGNOSIS — F419 Anxiety disorder, unspecified: Secondary | ICD-10-CM | POA: Diagnosis not present

## 2016-04-04 ENCOUNTER — Telehealth: Payer: Self-pay | Admitting: Oncology

## 2016-04-04 LAB — COMPREHENSIVE METABOLIC PANEL
ALT: 42 U/L (ref 17–63)
AST: 44 U/L — ABNORMAL HIGH (ref 15–41)
Albumin: 2.4 g/dL — ABNORMAL LOW (ref 3.5–5.0)
Alkaline Phosphatase: 142 U/L — ABNORMAL HIGH (ref 38–126)
Anion gap: 9 (ref 5–15)
BUN: 35 mg/dL — ABNORMAL HIGH (ref 6–20)
CALCIUM: 8.1 mg/dL — AB (ref 8.9–10.3)
CHLORIDE: 96 mmol/L — AB (ref 101–111)
CO2: 24 mmol/L (ref 22–32)
CREATININE: 1.37 mg/dL — AB (ref 0.61–1.24)
GFR, EST NON AFRICAN AMERICAN: 52 mL/min — AB (ref >60)
Glucose, Bld: 73 mg/dL (ref 65–99)
Potassium: 4.7 mmol/L (ref 3.5–5.1)
Sodium: 129 mmol/L — ABNORMAL LOW (ref 135–145)
Total Bilirubin: 3.9 mg/dL — ABNORMAL HIGH (ref 0.3–1.2)
Total Protein: 5.2 g/dL — ABNORMAL LOW (ref 6.5–8.1)

## 2016-04-04 LAB — CBC WITH DIFFERENTIAL/PLATELET
BASOS PCT: 0 %
Basophils Absolute: 0 10*3/uL (ref 0–0.1)
EOS ABS: 0.3 10*3/uL (ref 0–0.7)
Eosinophils Relative: 2 %
HCT: 26.6 % — ABNORMAL LOW (ref 40.0–52.0)
HEMOGLOBIN: 9.3 g/dL — AB (ref 13.0–18.0)
Lymphocytes Relative: 10 %
Lymphs Abs: 1.2 10*3/uL (ref 1.0–3.6)
MCH: 41.3 pg — ABNORMAL HIGH (ref 26.0–34.0)
MCHC: 35 g/dL (ref 32.0–36.0)
MCV: 118.2 fL — ABNORMAL HIGH (ref 80.0–100.0)
Monocytes Absolute: 1.6 10*3/uL — ABNORMAL HIGH (ref 0.2–1.0)
Monocytes Relative: 13 %
NEUTROS PCT: 75 %
Neutro Abs: 9.6 10*3/uL — ABNORMAL HIGH (ref 1.4–6.5)
Platelets: 107 10*3/uL — ABNORMAL LOW (ref 150–440)
RBC: 2.25 MIL/uL — AB (ref 4.40–5.90)
RDW: 16.8 % — ABNORMAL HIGH (ref 11.5–14.5)
WBC: 12.7 10*3/uL — AB (ref 3.8–10.6)

## 2016-04-04 LAB — AMMONIA: AMMONIA: 65 umol/L — AB (ref 9–35)

## 2016-04-04 NOTE — Telephone Encounter (Signed)
RETURNED PATIENT'S CALL REGARDING 04/06/16 LAB APPT TIME. L/M ON V/M

## 2016-04-05 ENCOUNTER — Other Ambulatory Visit: Payer: Medicare Other

## 2016-04-06 ENCOUNTER — Encounter: Payer: Self-pay | Admitting: *Deleted

## 2016-04-06 ENCOUNTER — Other Ambulatory Visit (HOSPITAL_BASED_OUTPATIENT_CLINIC_OR_DEPARTMENT_OTHER): Payer: Medicare Other

## 2016-04-06 DIAGNOSIS — C61 Malignant neoplasm of prostate: Secondary | ICD-10-CM

## 2016-04-06 LAB — COMPREHENSIVE METABOLIC PANEL
ALT: 43 U/L (ref 0–55)
ANION GAP: 8 meq/L (ref 3–11)
AST: 44 U/L — ABNORMAL HIGH (ref 5–34)
Albumin: 2.4 g/dL — ABNORMAL LOW (ref 3.5–5.0)
Alkaline Phosphatase: 172 U/L — ABNORMAL HIGH (ref 40–150)
BUN: 32.4 mg/dL — ABNORMAL HIGH (ref 7.0–26.0)
CHLORIDE: 97 meq/L — AB (ref 98–109)
CO2: 24 meq/L (ref 22–29)
CREATININE: 1.4 mg/dL — AB (ref 0.7–1.3)
Calcium: 8.2 mg/dL — ABNORMAL LOW (ref 8.4–10.4)
EGFR: 53 mL/min/{1.73_m2} — ABNORMAL LOW (ref >90)
Glucose: 81 mg/dl (ref 70–140)
POTASSIUM: 5.2 meq/L — AB (ref 3.5–5.1)
Sodium: 129 mEq/L — ABNORMAL LOW (ref 136–145)
Total Bilirubin: 4.17 mg/dL (ref 0.20–1.20)
Total Protein: 5.7 g/dL — ABNORMAL LOW (ref 6.4–8.3)

## 2016-04-06 LAB — CBC WITH DIFFERENTIAL/PLATELET
BASO%: 0.2 % (ref 0.0–2.0)
Basophils Absolute: 0 10*3/uL (ref 0.0–0.1)
EOS%: 2.4 % (ref 0.0–7.0)
Eosinophils Absolute: 0.3 10*3/uL (ref 0.0–0.5)
HCT: 27.3 % — ABNORMAL LOW (ref 38.4–49.9)
HGB: 9.5 g/dL — ABNORMAL LOW (ref 13.0–17.1)
LYMPH%: 6.2 % — AB (ref 14.0–49.0)
MCH: 39.6 pg — ABNORMAL HIGH (ref 27.2–33.4)
MCHC: 34.8 g/dL (ref 32.0–36.0)
MCV: 113.8 fL — ABNORMAL HIGH (ref 79.3–98.0)
MONO#: 0.9 10*3/uL (ref 0.1–0.9)
MONO%: 7.8 % (ref 0.0–14.0)
NEUT%: 83.4 % — ABNORMAL HIGH (ref 39.0–75.0)
NEUTROS ABS: 9.9 10*3/uL — AB (ref 1.5–6.5)
PLATELETS: 111 10*3/uL — AB (ref 140–400)
RBC: 2.4 10*6/uL — AB (ref 4.20–5.82)
RDW: 16.1 % — ABNORMAL HIGH (ref 11.0–14.6)
WBC: 11.9 10*3/uL — AB (ref 4.0–10.3)
lymph#: 0.7 10*3/uL — ABNORMAL LOW (ref 0.9–3.3)
nRBC: 0 % (ref 0–0)

## 2016-04-07 ENCOUNTER — Telehealth: Payer: Self-pay | Admitting: Internal Medicine

## 2016-04-07 ENCOUNTER — Other Ambulatory Visit: Payer: Self-pay | Admitting: Internal Medicine

## 2016-04-07 ENCOUNTER — Encounter
Admission: RE | Admit: 2016-04-07 | Discharge: 2016-04-07 | Disposition: A | Discharge status: Home / Self Care | Payer: Medicare Other | Source: Ambulatory Visit | Attending: Internal Medicine | Admitting: Internal Medicine

## 2016-04-07 DIAGNOSIS — K7031 Alcoholic cirrhosis of liver with ascites: Secondary | ICD-10-CM

## 2016-04-07 LAB — PSA: Prostate Specific Ag, Serum: <0.1 ng/mL (ref 0.0–4.0)

## 2016-04-11 ENCOUNTER — Ambulatory Visit (HOSPITAL_COMMUNITY): Payer: Medicare Other

## 2016-04-11 ENCOUNTER — Other Ambulatory Visit: Payer: Self-pay

## 2016-04-11 DIAGNOSIS — K7031 Alcoholic cirrhosis of liver with ascites: Secondary | ICD-10-CM | POA: Diagnosis not present

## 2016-04-11 NOTE — Progress Notes (Addendum)
Dr. Mariea Clonts has ordered a paracentesis for the patient, but needs you to write the amount to be removed, how often, and albumin replacement.      Should this be a standing order? If yes how often?

## 2016-04-11 NOTE — Telephone Encounter (Signed)
I spoke with Beth at [facility] she wanted to let us know that he is scheduled for paracentesis for Friday

## 2016-04-11 NOTE — Telephone Encounter (Signed)
Beth from [place] in regarding this. Best # [phone]

## 2016-04-12 ENCOUNTER — Telehealth: Payer: Self-pay | Admitting: Oncology

## 2016-04-12 ENCOUNTER — Other Ambulatory Visit: Payer: Self-pay

## 2016-04-12 ENCOUNTER — Ambulatory Visit (HOSPITAL_BASED_OUTPATIENT_CLINIC_OR_DEPARTMENT_OTHER): Payer: Medicare Other | Admitting: Oncology

## 2016-04-12 VITALS — BP 114/76 | HR 87 | Temp 98.3°F | Resp 19 | Wt 238.0 lb

## 2016-04-12 DIAGNOSIS — I4891 Unspecified atrial fibrillation: Secondary | ICD-10-CM

## 2016-04-12 DIAGNOSIS — C7951 Secondary malignant neoplasm of bone: Secondary | ICD-10-CM | POA: Diagnosis not present

## 2016-04-12 DIAGNOSIS — R188 Other ascites: Secondary | ICD-10-CM

## 2016-04-12 DIAGNOSIS — C61 Malignant neoplasm of prostate: Secondary | ICD-10-CM

## 2016-04-12 NOTE — Telephone Encounter (Signed)
AVS REPORT AND SCHD GIVEN PER 04/12/16 LOS. °

## 2016-04-12 NOTE — Progress Notes (Signed)
Take up to 4 L and give albumin per protocol 10 g/L removed

## 2016-04-12 NOTE — Progress Notes (Signed)
I spoke with Beth at [facility].  New orders placed per Dr. Carlean Purl.  They are aware that they will need to schedule each paracentesis, but orders are in through mid November.    Paracentesis q 2 weeks.  Remove a max of 4 liters and replace with 10 GM per liter removed with Albumin.

## 2016-04-12 NOTE — Progress Notes (Signed)
Hematology and Oncology Follow Up Visit  Allen Schroeder [id number] [birth year] 65 y.o. 04/12/2016 2:00 PM [name], MDPerini, [name], MD   Principle Diagnosis: 65 year old gentleman with castration resistant prostate cancer with metastatic disease to the bone. He was initially diagnosed in October of 2009 with bony metastasis.   Prior Therapy: He was treated with hormonal therapy with monthly Fermagon injections and monthly Zometa infusions. PSA fell from 72.9 in October 2009 to a nadir of 0.24 by June of 2012.  PSA began to rise again and was 4.09 on 02/06/2012. Bone scan at that point did not show any obvious new lesions. He was started on Xtandi and once again has had a very nice response since.   Current therapy: Xtandi 160 mg daily started September of 2013 with an excellent response PSA is close to 0. Has been on hold since 11/2014 due to liver disease.  This was resumed on 07/08/2015. Therapy was withheld in July 2017 because of acute decompensation of his liver disease.  Interim History:  Allen Schroeder presents today for a followup visit. Since the last visit, he was hospitalized in July 2017 because of acute decompensation of his liver disease. He received large volume paracentesis and was treated with supportive measures and antibiotics and currently at a assisted living facility receiving rehabilitation. Since his discharge, he is still quite debilitated but improving slowly. He continues to have ascites which is limiting his mobility as well as causing abdominal pain and dyspnea on exertion.  He has been off Xtandi since his hospitalization and denied any bone pain or pathological fractures. He does not report any urological symptoms.  He reports no headaches, blurry vision or other neurological symptoms. He denied any alteration of mental status or syncope. He does not report any fevers or chills or sweats. He does not report any chest pain, palpitation. He does not report any cough or  hemoptysis. He does not report any nausea, vomiting, constipation or hematochezia. Has not reported any genitourinary complaints, petechiae or lymphadenopathy.  The rest of his review of systems unremarkable.   Medications: I have reviewed the patient's current medications.  Current Outpatient Prescriptions  Medication Sig Dispense Refill  . buPROPion (WELLBUTRIN SR) 200 MG 12 hr tablet Take 200 mg by mouth daily.    . Calcium Carb-Cholecalciferol (CALCIUM + D3 PO) Take 1 tablet by mouth 2 (two) times daily.    . ciprofloxacin (CIPRO) 500 MG tablet Take 500 mg by mouth daily.    . [employer] ASPIRIN LOW DOSE 81 MG EC tablet TAKE 1 TABLET BY MOUTH EVERY DAY (Patient taking differently: TAKE 1 TABLET BY MOUTH EVERY morning) 30 tablet 10  . feeding supplement, ENSURE, (ENSURE) PUDG Take 1 Container by mouth 3 (three) times daily with meals.    . folic acid (FOLVITE) 1 MG tablet Take 1 mg by mouth daily.  11  . furosemide (LASIX) 20 MG tablet Take 20 mg by mouth.    . lactulose (CHRONULAC) 10 GM/15ML solution Take 45 mLs (30 g total) by mouth 2 (two) times daily. 240 mL 0  . LORazepam (ATIVAN) 0.5 MG tablet Take 0.5 mg by mouth every 6 (six) hours as needed for anxiety.    . Multiple Vitamin (MULTIVITAMIN WITH MINERALS) TABS tablet Take 1 tablet by mouth daily with breakfast.    . oxyCODONE (OXY IR/ROXICODONE) 5 MG immediate release tablet Take 1-2 tablets (5-10 mg total) by mouth 2 (two) times daily as needed for severe pain. 30 tablet 0  .  oxymetazoline (AFRIN) 0.05 % nasal spray Place 1 spray into both nostrils at bedtime as needed for congestion.     . polyethylene glycol powder (GLYCOLAX/MIRALAX) powder Take 17 g by mouth daily as needed.    . prednisoLONE 5 MG TABS tablet Take 30 mg x 5 days, then 20 mg x 5 days, 10 mg x 10 days, then 5 mg x 10 days then stop. 100 each 0  . rifaximin (XIFAXAN) 550 MG TABS tablet Take 1 tablet (550 mg total) by mouth 2 (two) times daily. 60 tablet 0  . spironolactone  (ALDACTONE) 50 MG tablet Take 50 mg by mouth daily.    [name] thiamine 100 MG tablet Take 100 mg by mouth daily.    [name] triamcinolone cream (KENALOG) 0.1 % Apply 1 application topically 2 (two) times daily.    . Vitamins-Lipotropics (CVS BALANCED B-100 PO) Take 1 tablet by mouth daily.    [name] zinc oxide 20 % ointment Apply 1 application topically as needed for irritation.     No current facility-administered medications for this visit.      Allergies: No Known Allergies  Past Medical History, Surgical history, Social history, and Family History were reviewed and updated.   Physical Exam: Blood pressure 114/76, pulse 87, temperature 98.3 F (36.8 C), temperature source Oral, resp. rate 19, weight 238 lb (108 kg), SpO2 98 %. ECOG: 1 General appearance: Chronically ill-appearing gentleman. In a wheelchair today. Head: Normocephalic, without obvious abnormality no oral ulcers or lesions. Sclera icterus noted. Neck: no adenopathy no thyromegaly. Lymph nodes: Cervical, supraclavicular, and axillary nodes normal. Heart:regular rate and rhythm, S1, S2 normal, no murmur, click, rub or gallop Lung:chest clear, no wheezing, rales, normal symmetric air entry Abdomin: soft, non-tender, without masses or organomegaly. Tense ascites noted with shifting dullness or ascites. EXT:no erythema, induration, or nodules Neurological examination showed no deficits.   Lab Results: Lab Results  Component Value Date   WBC 11.9 (H) 04/06/2016   HGB 9.5 (L) 04/06/2016   HCT 27.3 (L) 04/06/2016   MCV 113.8 (H) 04/06/2016   PLT 111 (L) 04/06/2016     Chemistry      Component Value Date/Time   NA 129 (L) 04/06/2016 1057   K 5.2 (H) 04/06/2016 1057   CL 96 (L) 04/04/2016 1030   CL 100 12/10/2012 1540   CO2 24 04/06/2016 1057   BUN 32.4 (H) 04/06/2016 1057   CREATININE 1.4 (H) 04/06/2016 1057      Component Value Date/Time   CALCIUM 8.2 (L) 04/06/2016 1057   ALKPHOS 172 (H) 04/06/2016 1057   AST 44 (H)  04/06/2016 1057   ALT 43 04/06/2016 1057   BILITOT 4.17 ([place]) 04/06/2016 1057       Results for MONROE, VALONE (MRN [id number]) as of 04/12/2016 13:33  Ref. Range 03/21/2016 07:12 04/06/2016 10:57  PSA Latest Ref Range: 0.0 - 4.0 ng/mL 0.04 <0.1          Impression and Plan:  65 year old on with the following issues:  1. Castration resistant prostate cancer with metastatic disease to the bone. His continued on Xtandi with excellent response and a PSA of less than 0.01. I do not think his Gillermina Phy is contributing to his jaundice and his hepatic failure.   Gillermina Phy was resumed in December 2016 For 6 months and therapy was stopped in July 2017 because of acute decompensation of his liver disease.  His PSA continues to be very low without any evidence of disease progression. I recommended  continued to hold Xtandi at this time because of his other health issues. This will be resumed in the future if he develops progression of disease.   2. Jaundice related to alcoholic liver cirrhosis: His bilirubin appears to be declining slowly over the last few weeks. He is scheduled to have a paracentesis as well because of tense ascites in the near future.  3. Androgen deprivation: He is currently on androgen depravation. I recommend this continues for the time being. His Vantas will be replaced by Dr. Gaynelle Arabian in the near future.  4. Atrial fibrillation: He is currently in normal sinus rhythm and chronically anticoagulated with Pradaxa. no bleeding complications noted.   5. Followup: Will be in 6 weeks for a recheck of his PSA at that time.  [id number], MD 9/6/20172:00 PM

## 2016-04-12 NOTE — Progress Notes (Signed)
Every other week standing

## 2016-04-13 ENCOUNTER — Other Ambulatory Visit: Payer: Self-pay | Admitting: Internal Medicine

## 2016-04-13 NOTE — Addendum Note (Signed)
Addended by: Marlon Pel on: 04/13/2016 02:04 PM   Modules accepted: Orders

## 2016-04-14 ENCOUNTER — Ambulatory Visit (HOSPITAL_COMMUNITY)
Admission: RE | Admit: 2016-04-14 | Discharge: 2016-04-14 | Disposition: A | Discharge status: Home / Self Care | Payer: Medicare Other | Source: Ambulatory Visit | Attending: Internal Medicine | Admitting: Internal Medicine

## 2016-04-14 ENCOUNTER — Ambulatory Visit (HOSPITAL_COMMUNITY): Payer: Medicare Other

## 2016-04-14 DIAGNOSIS — K7460 Unspecified cirrhosis of liver: Secondary | ICD-10-CM | POA: Diagnosis not present

## 2016-04-14 DIAGNOSIS — R188 Other ascites: Secondary | ICD-10-CM | POA: Diagnosis not present

## 2016-04-14 MED ORDER — ALBUMIN HUMAN 25 % IV SOLN
40.0000 g | Freq: Once | INTRAVENOUS | Status: AC
  Administered 2016-04-14: 40 g via INTRAVENOUS
  Filled 2016-04-14: qty 200

## 2016-04-14 MED ORDER — LIDOCAINE HCL (PF) 1 % IJ SOLN
INTRAMUSCULAR | Status: AC
  Filled 2016-04-14: qty 10

## 2016-04-14 NOTE — Procedures (Signed)
Ultrasound-guided therapeutic paracentesis performed yielding 4 liters of yellow colored fluid. No immediate complications.  Aslan Himes E 3:07 PM 04/14/2016

## 2016-04-15 ENCOUNTER — Encounter (HOSPITAL_COMMUNITY): Payer: Self-pay

## 2016-04-15 ENCOUNTER — Emergency Department (HOSPITAL_COMMUNITY)
Admission: EM | Admit: 2016-04-15 | Discharge: 2016-04-15 | Disposition: A | Discharge status: Home / Self Care | Payer: Medicare Other | Attending: Emergency Medicine | Admitting: Emergency Medicine

## 2016-04-15 DIAGNOSIS — R197 Diarrhea, unspecified: Secondary | ICD-10-CM | POA: Insufficient documentation

## 2016-04-15 DIAGNOSIS — Z87891 Personal history of nicotine dependence: Secondary | ICD-10-CM | POA: Diagnosis not present

## 2016-04-15 DIAGNOSIS — R188 Other ascites: Secondary | ICD-10-CM

## 2016-04-15 DIAGNOSIS — Y69 Unspecified misadventure during surgical and medical care: Secondary | ICD-10-CM | POA: Insufficient documentation

## 2016-04-15 DIAGNOSIS — G8929 Other chronic pain: Secondary | ICD-10-CM | POA: Insufficient documentation

## 2016-04-15 DIAGNOSIS — I10 Essential (primary) hypertension: Secondary | ICD-10-CM | POA: Insufficient documentation

## 2016-04-15 DIAGNOSIS — Z791 Long term (current) use of non-steroidal anti-inflammatories (NSAID): Secondary | ICD-10-CM | POA: Diagnosis not present

## 2016-04-15 DIAGNOSIS — Z8546 Personal history of malignant neoplasm of prostate: Secondary | ICD-10-CM | POA: Diagnosis not present

## 2016-04-15 DIAGNOSIS — T8189XA Other complications of procedures, not elsewhere classified, initial encounter: Secondary | ICD-10-CM | POA: Diagnosis not present

## 2016-04-15 DIAGNOSIS — I2510 Atherosclerotic heart disease of native coronary artery without angina pectoris: Secondary | ICD-10-CM | POA: Diagnosis not present

## 2016-04-15 DIAGNOSIS — T8131XA Disruption of external operation (surgical) wound, not elsewhere classified, initial encounter: Secondary | ICD-10-CM | POA: Diagnosis not present

## 2016-04-15 DIAGNOSIS — M549 Dorsalgia, unspecified: Secondary | ICD-10-CM | POA: Insufficient documentation

## 2016-04-15 DIAGNOSIS — Z79899 Other long term (current) drug therapy: Secondary | ICD-10-CM | POA: Insufficient documentation

## 2016-04-15 LAB — I-STAT CHEM 8, ED
BUN: 20 mg/dL (ref 6–20)
CALCIUM ION: 1.08 mmol/L — AB (ref 1.15–1.40)
CHLORIDE: 97 mmol/L — AB (ref 101–111)
CREATININE: 1.2 mg/dL (ref 0.61–1.24)
GLUCOSE: 83 mg/dL (ref 65–99)
HCT: 26 % — ABNORMAL LOW (ref 39.0–52.0)
Hemoglobin: 8.8 g/dL — ABNORMAL LOW (ref 13.0–17.0)
Potassium: 4.4 mmol/L (ref 3.5–5.1)
Sodium: 134 mmol/L — ABNORMAL LOW (ref 135–145)
TCO2: 25 mmol/L (ref 0–100)

## 2016-04-15 MED ORDER — OXYCODONE HCL 5 MG PO TABS
10.0000 mg | ORAL_TABLET | Freq: Once | ORAL | Status: AC
  Administered 2016-04-15: 10 mg via ORAL
  Filled 2016-04-15: qty 2

## 2016-04-15 NOTE — ED Triage Notes (Signed)
He phoned EMS d/t paracentesis site (performed yesterday) at [facility]. Is "leaking fluid all over the place".  He arrives here comfortable, chipper and in no distress.

## 2016-04-15 NOTE — ED Provider Notes (Signed)
[facility] DEPT Provider Note   CSN: [id number] Arrival date & time: 04/15/16  1429     History   Chief Complaint Chief Complaint  Patient presents with  . Bloated    HPI Allen Schroeder is a 65 y.o. male.  HPI   Hx of ascites. Yesterday had paracentesis and 4L taken off. Typically will have 11L.  Patient initially without any leak around the site of puncture, however when dressing removed this AM he developed more leaking of fluid.  Unsure how much leaked but feels it was a significant amount.  Replacing several bandages.  Aarea is left abdomen.  Has had this issue prior however it resolved with dermabond.  This time, dermabond was placed immedately after procedure however area has leakage today.No abdominal pain, no fevers, no n/v. Past Medical History:  Diagnosis Date  . Acute alcoholic hepatitis   . Alcohol abuse with physiological dependence ([place]) 02/13/2012  . Alcoholic hepatitis with ascites 11/13/2014  . Anxiety state, unspecified   . Atrial fibrillation ([place])   . Bacteremia, escherichia coli   . Bisphosphonate-associated osteonecrosis of the jaw ([place]) 04/28/2013   Localized area base of tooth #18 noted 8/14 by oral surgeon  . Cirrhosis, alcoholic ([place])   . Coronary artery disease    s/p PCI to the LAD in 2011 with a BMS  . Depression   . Dysrhythmia   . Elevated prostate specific antigen (PSA)   . Essential and other specified forms of tremor   . Family history of malignant neoplasm of gastrointestinal tract   . Hepatorenal syndrome ([place]) 02/13/2016  . Hypocalcemia   . Malignant neoplasm of prostate ([place]) 11/2007   Mets to Bone  . Other and unspecified hyperlipidemia   . Personal history of colonic polyps   . Pneumonia    childhood  . Symptomatic anemia 02/13/2016  . Unspecified essential hypertension     Patient Active Problem List   Diagnosis Date Noted  . Constipation 02/25/2016  . Ascites due to alcoholic cirrhosis ([place])   . Alcoholic cirrhosis of liver  with ascites ([place])   . Jaundice   . Acute renal failure ([place])   . Anemia of chronic disease   . Decompensation of cirrhosis of liver ([place]) 02/13/2016  . Hepatorenal syndrome ([place]) 02/13/2016  . Prolonged Q-T interval on ECG 02/13/2016  . Hypotension 02/13/2016  . Symptomatic anemia 02/13/2016  . Hyponatremia 01/28/2016  . Right-sided low back pain without sciatica   . Encephalopathy, hepatic ([place])   . Cirrhosis with alcoholism ([place]) 12/21/2014  . Rib fractures   . Portal hypertension ([place])   . Thrombocytopenia ([place])   . Generalized anxiety disorder   . Ascites 11/18/2014  . 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 Prostate cancer, primary, with metastasis from prostate to other site [facility]) 02/13/2012  . History of placement of stent in LAD coronary artery 02/13/2012  . Alcohol abuse with physiological dependence ([place]) 02/13/2012  . CAD, NATIVE VESSEL 04/26/2010  . HLD (hyperlipidemia) 01/22/2009  . Atrial fibrillation ([place]) 12/29/2008  . Essential hypertension 02/10/2008    Past Surgical History:  Procedure Laterality Date  . CARDIAC CATHETERIZATION    . CARDIOVERSION N/A 08/18/2015   Procedure: CARDIOVERSION;  Surgeon: Thayer Headings, MD;  Location: [facility] ENDOSCOPY;  Service: Cardiovascular;  Laterality: N/A;  . CHOLECYSTECTOMY    . COLONOSCOPY W/ BIOPSIES    . CORONARY STENT PLACEMENT  2011  . TEE WITHOUT CARDIOVERSION N/A 08/18/2015   Procedure: TRANSESOPHAGEAL ECHOCARDIOGRAM (TEE);  Surgeon: Thayer Headings, MD;  Location: [place];  Service: Cardiovascular;  Laterality: N/A;  . VASECTOMY         Home Medications    Prior to Admission medications   Medication Sig Start Date End Date Taking? Authorizing Provider  buPROPion (WELLBUTRIN SR) 200 MG 12 hr tablet Take 200 mg by mouth daily.    Historical Provider, MD  Calcium Carb-Cholecalciferol (CALCIUM + D3 PO) Take 1 tablet by mouth 2 (two) times daily.    Historical Provider, MD    ciprofloxacin (CIPRO) 500 MG tablet Take 500 mg by mouth daily.    Historical Provider, MD  CVS ASPIRIN LOW DOSE 81 MG EC tablet TAKE 1 TABLET BY MOUTH EVERY DAY Patient taking differently: TAKE 1 TABLET BY MOUTH EVERY morning 09/20/15   Sherren Mocha, MD  folic acid (FOLVITE) 1 MG tablet Take 1 mg by mouth daily. 03/26/16   Historical Provider, MD  furosemide (LASIX) 20 MG tablet Take 20 mg by mouth.    Historical Provider, MD  ipratropium-albuterol (DUONEB) 0.5-2.5 (3) MG/3ML SOLN Take 3 mLs by nebulization every 4 (four) hours as needed.    Historical Provider, MD  lactulose (CHRONULAC) 10 GM/15ML solution Take 45 mLs (30 g total) by mouth 2 (two) times daily. 02/18/16   Domenic Polite, MD  LORazepam (ATIVAN) 0.5 MG tablet  04/05/16   Historical Provider, MD  Multiple Vitamin (MULTIVITAMIN WITH MINERALS) TABS tablet Take 1 tablet by mouth daily with breakfast.    Historical Provider, MD  oxyCODONE (OXY IR/ROXICODONE) 5 MG immediate release tablet Take 1-2 tablets (5-10 mg total) by mouth 2 (two) times daily as needed for severe pain. 02/18/16   Domenic Polite, MD  oxymetazoline (AFRIN) 0.05 % nasal spray Place 1 spray into both nostrils at bedtime as needed for congestion.     Historical Provider, MD  polyethylene glycol powder (GLYCOLAX/MIRALAX) powder Take 17 g by mouth daily as needed.    Historical Provider, MD  rifaximin (XIFAXAN) 550 MG TABS tablet Take 1 tablet (550 mg total) by mouth 2 (two) times daily. 12/29/14   Modena Jansky, MD  spironolactone (ALDACTONE) 50 MG tablet Take 50 mg by mouth daily.    Historical Provider, MD  thiamine 100 MG tablet Take 100 mg by mouth daily.    Historical Provider, MD  triamcinolone cream (KENALOG) 0.1 % Apply 1 application topically 2 (two) times daily.    Historical Provider, MD  Vitamins-Lipotropics (CVS BALANCED B-100 PO) Take 1 tablet by mouth daily.    Historical Provider, MD  zinc oxide 20 % ointment Apply 1 application topically as needed for  irritation.    Historical Provider, MD    Family History Family History  Problem Relation Age of Onset  . Prostate cancer Father   . Coronary artery disease Father   . Colon cancer Father   . Aneurysm Mother   . Prostate cancer Brother     Social History Social History  Substance Use Topics  . Smoking status: Former Smoker    Types: Cigarettes  . Smokeless tobacco: Never Used     Comment: stopped in college  . Alcohol use 1.2 - 1.8 oz/week    2 - 3 Shots of liquor per week     Comment: has consumed as much as a 1/2 gallon of vodka in a day, now drinks at least 3-5 drinks per day.      Allergies   Review of patient's allergies indicates no known allergies.  Review of Systems Review of Systems  Constitutional: Negative for fever.  HENT: Negative for sore throat.   Eyes: Negative for visual disturbance.  Respiratory: Negative for shortness of breath.   Cardiovascular: Negative for chest pain.  Gastrointestinal: Positive for abdominal distention (decreased since paracentesis) and diarrhea (from lactulose). Negative for abdominal pain, nausea and vomiting.  Genitourinary: Negative for difficulty urinating.  Musculoskeletal: Positive for back pain (chronic). Negative for neck stiffness.  Skin: Positive for wound (from paracentesis). Negative for rash.  Neurological: Negative for syncope and headaches.     Physical Exam Updated Vital Signs BP 113/78 (BP Location: Right Arm)   Pulse 82   Temp 98.1 F (36.7 C) (Oral)   Resp 14   Ht 6\' 2"  (1.88 m)   Wt 234 lb (106.1 kg)   SpO2 100%   BMI 30.04 kg/m   Physical Exam  Constitutional: He is oriented to person, place, and time. He appears well-developed and well-nourished. No distress.  HENT:  Head: Normocephalic and atraumatic.  Eyes: Conjunctivae and EOM are normal.  Neck: Normal range of motion.  Cardiovascular: Normal rate, regular rhythm, normal heart sounds and intact distal pulses.  Exam reveals no gallop and  no friction rub.   No murmur heard. Pulmonary/Chest: Effort normal and breath sounds normal. No respiratory distress. He has no wheezes. He has no rales.  Abdominal: Soft. He exhibits distension and ascites. There is no tenderness. There is no guarding.  Leaking fluid from paracentesis site left lower side  Musculoskeletal: He exhibits no edema.  Neurological: He is alert and oriented to person, place, and time.  Skin: Skin is warm and dry. He is not diaphoretic.  Nursing note and vitals reviewed.    ED Treatments / Results  Labs (all labs ordered are listed, but only abnormal results are displayed) Labs Reviewed  I-STAT CHEM 8, ED - Abnormal; Notable for the following:       Result Value   Sodium 134 (*)    Chloride 97 (*)    Calcium, Ion 1.08 (*)    Hemoglobin 8.8 (*)    HCT 26.0 (*)    All other components within normal limits    EKG  EKG Interpretation None       Radiology US Paracentesis  Result Date: 04/14/2016 INDICATION: Recurrent abdominal ascites secondary to cirrhosis. Request is made for therapeutic paracentesis. He is given a 4 L maximum today. EXAM: ULTRASOUND GUIDED THERAPEUTIC PARACENTESIS MEDICATIONS: 1% lidocaine COMPLICATIONS: None immediate. PROCEDURE: Informed written consent was obtained from the patient after a discussion of the risks, benefits and alternatives to treatment. A timeout was performed prior to the initiation of the procedure. Initial ultrasound scanning demonstrates a large amount of ascites within the right lower abdominal quadrant. The right lower abdomen was prepped and draped in the usual sterile fashion. 1% lidocaine was used for local anesthesia. Following this, a 19 gauge, 7-cm, Yueh catheter was introduced. An ultrasound image was saved for documentation purposes. The paracentesis was performed. The catheter was removed and a dressing was applied. The patient tolerated the procedure well without immediate post procedural complication.  FINDINGS: A total of approximately 4 L of yellow fluid was removed. IMPRESSION: Successful ultrasound-guided paracentesis yielding 4 liters of peritoneal fluid. Read by: Saverio Danker, PA-C Electronically Signed   By: Corrie Mckusick D.O.   On: 04/14/2016 15:11    Procedures Procedures (including critical care time)  Medications Ordered in ED Medications  oxyCODONE (Oxy IR/ROXICODONE) immediate release tablet 10 mg (  10 mg Oral Given 04/15/16 1905)     Initial Impression / Assessment and Plan / ED Course  I have reviewed the triage vital signs and the nursing notes.  Pertinent labs & imaging results that were available during my care of the patient were reviewed by me and considered in my medical decision making (see chart for details).  Clinical Course   65yo male with hx including cirrhosis/ascites presents with concern for leaking from paracentesis site after having procedure yesterday.  Attempted to stop area from leaking with dermabond without success. Removed prior dermabond, cleansed area and attempted to add more dermabond using blow-by O2 drying technique with decrease in amount of leakage however continued leaking.  Obtained Cr which was 1.2, improved from prior.  Discussed with Dr. Collene Mares on call for Dr Carlean Purl.  I am unable to completely stop area from leaking, however patient without signs of symptoms of infection. Feel placing drain for monitoring with close outpt follow up with expedited repeat therapeutic theracentesis is appropriate.  Discussed reasons to return to ED in detail. Patient discharged in stable condition with understanding of reasons to return.  Final Clinical Impressions(s) / ED Diagnoses   Final diagnoses:  Ascites, leak of ascitic fluid    New Prescriptions Discharge Medication List as of 04/15/2016  7:58 PM       Gareth Morgan, MD 04/16/16 0117

## 2016-04-15 NOTE — ED Notes (Signed)
No respiratory or acute distress noted alert and oriented x 3 call light in reach sitter at bedside.

## 2016-04-15 NOTE — ED Notes (Signed)
Transport arrived for pt.   

## 2016-04-15 NOTE — ED Notes (Signed)
[facility]- They are sending someone to pick him up.

## 2016-04-17 ENCOUNTER — Telehealth: Payer: Self-pay | Admitting: Internal Medicine

## 2016-04-17 DIAGNOSIS — R188 Other ascites: Secondary | ICD-10-CM

## 2016-04-17 NOTE — Telephone Encounter (Signed)
Beth calling back regarding this. Beth states that patient needs to be seen sometime this week. Best # [phone]

## 2016-04-17 NOTE — Telephone Encounter (Signed)
Discussed with Dr. Anda Kraft. Patient to have additional paracentesis now.  Remove a max of 8 liters with albumin replacement.  I spoke with Judeen Hammans at [facility] and she will arrange the paracentesis with their scheduler and transportation.  They will call back for any additional questions or concerns.

## 2016-04-18 ENCOUNTER — Ambulatory Visit (HOSPITAL_COMMUNITY)
Admission: RE | Admit: 2016-04-18 | Discharge: 2016-04-18 | Disposition: A | Discharge status: Home / Self Care | Payer: Medicare Other | Source: Ambulatory Visit | Attending: Internal Medicine | Admitting: Internal Medicine

## 2016-04-18 DIAGNOSIS — R188 Other ascites: Secondary | ICD-10-CM | POA: Insufficient documentation

## 2016-04-18 MED ORDER — LIDOCAINE HCL 1 % IJ SOLN
INTRAMUSCULAR | Status: AC
  Filled 2016-04-18: qty 20

## 2016-04-18 MED ORDER — ALBUMIN HUMAN 25 % IV SOLN
62.5000 g | Freq: Once | INTRAVENOUS | Status: AC
  Administered 2016-04-18: 62.5 g via INTRAVENOUS
  Filled 2016-04-18: qty 300

## 2016-04-18 NOTE — Procedures (Signed)
Successful US guided paracentesis from [facility].  Yielded 6.2L of clear yellow fluid.  No immediate complications.  Pt tolerated well.   Specimen was not sent for labs.  Ascencion Dike PA-C 04/18/2016 2:45 PM

## 2016-04-20 ENCOUNTER — Telehealth: Payer: Self-pay | Admitting: Internal Medicine

## 2016-04-20 NOTE — Telephone Encounter (Signed)
I spoke with Allen Schroeder at [facility].  They have notified urology and Dr. [name].  She also wanted you to be aware

## 2016-04-21 ENCOUNTER — Encounter: Payer: Self-pay | Admitting: Internal Medicine

## 2016-04-24 ENCOUNTER — Telehealth: Payer: Self-pay | Admitting: Internal Medicine

## 2016-04-24 NOTE — Telephone Encounter (Signed)
Faxed to above number

## 2016-04-24 NOTE — Telephone Encounter (Signed)
Remove ostomy bag 04/24/16  from paracentesis site if no further drainage.

## 2016-04-28 ENCOUNTER — Ambulatory Visit (HOSPITAL_COMMUNITY)
Admission: RE | Admit: 2016-04-28 | Discharge: 2016-04-28 | Disposition: A | Discharge status: Home / Self Care | Payer: Medicare Other | Source: Ambulatory Visit | Attending: Internal Medicine | Admitting: Internal Medicine

## 2016-04-28 DIAGNOSIS — R188 Other ascites: Secondary | ICD-10-CM | POA: Diagnosis not present

## 2016-04-28 MED ORDER — ALBUMIN HUMAN 25 % IV SOLN
40.0000 g | Freq: Once | INTRAVENOUS | Status: AC
  Administered 2016-04-28: 40 g via INTRAVENOUS
  Filled 2016-04-28: qty 200

## 2016-04-28 NOTE — Procedures (Signed)
Successful US guided paracentesis from [facility].  Yielded 4L of clear yellow fluid.  No immediate complications.  Pt tolerated well.   Specimen was not sent for labs.  Ascencion Dike PA-C 04/28/2016 3:05 PM

## 2016-05-03 DIAGNOSIS — C61 Malignant neoplasm of prostate: Secondary | ICD-10-CM | POA: Diagnosis not present

## 2016-05-03 DIAGNOSIS — N430 Encysted hydrocele: Secondary | ICD-10-CM | POA: Diagnosis not present

## 2016-05-08 DIAGNOSIS — E0590 Thyrotoxicosis, unspecified without thyrotoxic crisis or storm: Secondary | ICD-10-CM | POA: Diagnosis not present

## 2016-05-08 DIAGNOSIS — I4891 Unspecified atrial fibrillation: Secondary | ICD-10-CM | POA: Diagnosis not present

## 2016-05-08 DIAGNOSIS — K7031 Alcoholic cirrhosis of liver with ascites: Secondary | ICD-10-CM | POA: Diagnosis not present

## 2016-05-08 DIAGNOSIS — E559 Vitamin D deficiency, unspecified: Secondary | ICD-10-CM | POA: Diagnosis not present

## 2016-05-08 DIAGNOSIS — R350 Frequency of micturition: Secondary | ICD-10-CM | POA: Diagnosis not present

## 2016-05-08 DIAGNOSIS — E785 Hyperlipidemia, unspecified: Secondary | ICD-10-CM | POA: Diagnosis not present

## 2016-05-09 ENCOUNTER — Encounter: Payer: Self-pay | Admitting: Internal Medicine

## 2016-05-09 ENCOUNTER — Ambulatory Visit (INDEPENDENT_AMBULATORY_CARE_PROVIDER_SITE_OTHER): Payer: Medicare Other | Admitting: Internal Medicine

## 2016-05-09 ENCOUNTER — Encounter (INDEPENDENT_AMBULATORY_CARE_PROVIDER_SITE_OTHER): Payer: Self-pay

## 2016-05-09 VITALS — BP 122/70 | HR 100

## 2016-05-09 DIAGNOSIS — K7290 Hepatic failure, unspecified without coma: Secondary | ICD-10-CM | POA: Diagnosis not present

## 2016-05-09 DIAGNOSIS — I2510 Atherosclerotic heart disease of native coronary artery without angina pectoris: Secondary | ICD-10-CM

## 2016-05-09 DIAGNOSIS — N5089 Other specified disorders of the male genital organs: Secondary | ICD-10-CM | POA: Diagnosis not present

## 2016-05-09 DIAGNOSIS — K7031 Alcoholic cirrhosis of liver with ascites: Secondary | ICD-10-CM

## 2016-05-09 DIAGNOSIS — K7682 Hepatic encephalopathy: Secondary | ICD-10-CM

## 2016-05-09 MED ORDER — FUROSEMIDE 40 MG PO TABS
40.0000 mg | ORAL_TABLET | Freq: Every day | ORAL | 11 refills | Status: AC
Start: 2016-05-09 — End: ?

## 2016-05-09 MED ORDER — SPIRONOLACTONE 100 MG PO TABS: 100.0000 mg | ORAL_TABLET | Freq: Every day | ORAL | 11 refills | Status: DC

## 2016-05-09 NOTE — Patient Instructions (Signed)
  We are giving you new rx's for the furosemide and the spironolactone today with the new dosage.    Please have them draw blood and fax [place] the results to [phone].     I appreciate the opportunity to care for you. Silvano Rusk, MD, [facility]

## 2016-05-09 NOTE — Progress Notes (Signed)
Allen Schroeder 65 y.o. 1951/06/18 [id number]  Assessment & Plan:   1. Ascites due to alcoholic cirrhosis (HCC)   2. Scrotal edema   3. Alcoholic cirrhosis of liver with ascites (Allen Schroeder)   4. Encephalopathy, hepatic (Allen Schroeder)    Overall he seems improved and labs showed less jaundice. Perhaps there is some healing going on with the liver and he had a component of hepatitis along with suspected cirrhosis. Volume overload with ascites remains a problem and is causing his abdominal girth issues and scrotal edema. His encephalopathy appears controlled to me. Plans are: Increase furosemide and spironolactone CMEt in 2-3 weeks Continue paracenteses RTC 3 mos  CC: Allen Ly, MD Allen Schroeder M.D. Dr. Hollace Schroeder  Subjective:   Chief Complaint:Swollen groin  HPI Allen Schroeder returns after being seen 6 weeks ago, at which point he embarked on regular paracenteses he's had a few of those. He was in the hospital with a Schroeder again after one of them and we had a larger volume paracentesis done and the week improved. He is complaining of a swollen scrotum and difficulty locating his penis to be able to urinate though he is getting it done its problematic. He might have a bit more energy. His wife per dissipates and some of the interview at the end. She think she is a little bit better. He saw Dr. Arlyn Schroeder physician assistant recently, ultrasound of the scrotal area didn't show any tumors or problems. He was relieved to hear that. He is now in [place], having moved over from [place].  No Known Allergies Outpatient Medications Prior to Visit  Medication Sig Dispense Refill  . buPROPion (WELLBUTRIN SR) 200 MG 12 hr tablet Take 200 mg by mouth daily.    . Calcium Carb-Cholecalciferol (CALCIUM + D3 PO) Take 1 tablet by mouth 2 (two) times daily.    . ciprofloxacin (CIPRO) 500 MG tablet Take 500 mg by mouth daily.    . CVS ASPIRIN LOW DOSE 81 MG EC tablet TAKE 1 TABLET BY MOUTH EVERY DAY  (Patient taking differently: TAKE 1 TABLET BY MOUTH EVERY morning) 30 tablet 10  . folic acid (FOLVITE) 1 MG tablet Take 1 mg by mouth daily.  11  . ipratropium-albuterol (DUONEB) 0.5-2.5 (3) MG/3ML SOLN Take 3 mLs by nebulization every 4 (four) hours as needed.    . lactulose (CHRONULAC) 10 GM/15ML solution Take 45 mLs (30 g total) by mouth 2 (two) times daily. 240 mL 0  . LORazepam (ATIVAN) 0.5 MG tablet Take 0.5 mg by mouth once a week.     . Multiple Vitamin (MULTIVITAMIN WITH MINERALS) TABS tablet Take 1 tablet by mouth daily with breakfast.    . oxyCODONE (OXY IR/ROXICODONE) 5 MG immediate release tablet Take 1-2 tablets (5-10 mg total) by mouth 2 (two) times daily as needed for severe pain. 30 tablet 0  . polyethylene glycol powder (GLYCOLAX/MIRALAX) powder Take 17 g by mouth daily as needed.    . rifaximin (XIFAXAN) 550 MG TABS tablet Take 1 tablet (550 mg total) by mouth 2 (two) times daily. 60 tablet 0  . thiamine 100 MG tablet Take 100 mg by mouth daily.    . Vitamins-Lipotropics (CVS BALANCED B-100 PO) Take 1 tablet by mouth daily.    [name] zinc oxide 20 % ointment Apply 1 application topically as needed for irritation.    . furosemide (LASIX) 20 MG tablet Take 20 mg by mouth.    . spironolactone (ALDACTONE) 50 MG tablet Take 50 mg  by mouth daily.    [name] oxymetazoline (AFRIN) 0.05 % nasal spray Place 1 spray into both nostrils at bedtime as needed for congestion.     . triamcinolone cream (KENALOG) 0.1 % Apply 1 application topically 2 (two) times daily.     No facility-administered medications prior to visit.    Past Medical History:  Diagnosis Date  . Acute alcoholic hepatitis   . Alcohol abuse with physiological dependence ([place]) 02/13/2012  . Alcoholic hepatitis with ascites 11/13/2014  . Anxiety state, unspecified   . Atrial fibrillation ([place])   . Bacteremia, escherichia coli   . 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 Cirrhosis, alcoholic ([place])   . Coronary artery disease    s/p PCI to the LAD in 2011 with a BMS  . Depression   . Dysrhythmia   . Elevated prostate specific antigen (PSA)   . Essential and other specified forms of tremor   . Family history of malignant neoplasm of gastrointestinal tract   . Hepatorenal syndrome ([place]) 02/13/2016  . Hypocalcemia   . Malignant neoplasm of prostate ([place]) 11/2007   Mets to Bone  . Other and unspecified hyperlipidemia   . Personal history of colonic polyps   . Pneumonia    childhood  . Symptomatic anemia 02/13/2016  . Unspecified essential hypertension    Past Surgical History:  Procedure Laterality Date  . CARDIAC CATHETERIZATION    . CARDIOVERSION N/A 08/18/2015   Procedure: CARDIOVERSION;  Surgeon: Thayer Headings, MD;  [facility] ENDOSCOPY;  Service: Cardiovascular;  Laterality: N/A;  . CHOLECYSTECTOMY    . COLONOSCOPY W/ BIOPSIES    . CORONARY STENT PLACEMENT  2011  . TEE WITHOUT CARDIOVERSION N/A 08/18/2015   Procedure: TRANSESOPHAGEAL ECHOCARDIOGRAM (TEE);  Surgeon: Thayer Headings, MD;  Location: [place];  Service: Cardiovascular;  Laterality: N/A;  . VASECTOMY     Social History   Social History  . Marital status: Married    Spouse name: N/A  . Number of children: 3  . Years of education: N/A   Occupational History  . lawyer     VP of [employer] History Main Topics  . Smoking status: Former Smoker    Types: Cigarettes  . Smokeless tobacco: Never Used     Comment: stopped in college  . Alcohol use 1.2 - 1.8 oz/week    2 - 3 Shots of liquor per week     Comment: has consumed as much as a 1/2 gallon of vodka in a day, now drinks at least 3-5 drinks per day.   . Drug use: No  . Sexual activity: No   Other Topics Concern  . None   Social History Narrative   Patient is married. 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 He has two sons, one daughter and 5 grandchildren. Both sons work in the business. Daughter lives in [place]. Close knit family.    11/18/2014         Tobacco use, amount per day now: RARELY-- FEW MONTHS WAY BACK   Past tobacco use, amount per day: 25 YEARS AGO   How many years did you use tobacco: SEVERAL MONTHS AGO   Alcohol use (drinks per week): IN RECOVERY  Diet: EATS WELL AND HEALTHY, LOVES SALT, LIMITING IT NOW   Do you drink/eat things with caffeine: RARELY   Marital status: MARRIED                            What year were you married? 1975   Do you live in a house, apartment, assisted living, condo, trailer, etc.? HOUSE   Is it one or more stories? YES, 2 MAIN/ 1 BASEMENT & PLAYROOM AREA/ 1 FINISHED   How many persons live in your home? JUST 2 BUT KIDS ARE OFTEN HERE   Do you have pets in your home?( please list) NO   Current or past profession: FINANCE OFFICER, [employer]/ [employer]?   Do you exercise?     YES                             Type and how often? NOT MUCH NOW WITH PHYSICAL CHALLENGES   Do you have a living will? YES   Do you have a DNR form?  YES                                 If not, do you want to discuss one?   Do you have signed POA/HPOA forms?    YES                    If so, please bring to you appointment      Family History  Problem Relation Age of Onset  . Prostate cancer Father   . Coronary artery disease Father   . Colon cancer Father   . Aneurysm Mother   . Prostate cancer Brother     Review of Systems As above.  Objective:   Physical Exam BP 122/70 (BP Location: Left Arm, Patient Position: Sitting, Cuff Size: Normal)   Pulse 100  Eyes mildly icteric Lungs clear Heart S1-S2 no rubs or gallops Abdomen shows moderately large ascites The scrotum is swollen with fluid in the groin is distended as well Extremities show 1+ bilateral  lower extremity edema He is alert and oriented 3 and there is no asterixis  I reviewed September 2017 labs from ER visits and paracentesis notes. I have reviewed oncology note from September.

## 2016-05-10 DIAGNOSIS — K7031 Alcoholic cirrhosis of liver with ascites: Secondary | ICD-10-CM | POA: Diagnosis not present

## 2016-05-10 DIAGNOSIS — N430 Encysted hydrocele: Secondary | ICD-10-CM | POA: Diagnosis not present

## 2016-05-10 DIAGNOSIS — C61 Malignant neoplasm of prostate: Secondary | ICD-10-CM | POA: Diagnosis not present

## 2016-05-10 LAB — BASIC METABOLIC PANEL
BUN: 10 mg/dL (ref 4–21)
Creatinine: 1 mg/dL (ref 0.6–1.3)
Glucose: 97 mg/dL
Potassium: 3.7 mmol/L (ref 3.4–5.3)
Sodium: 133 mmol/L — AB (ref 137–147)

## 2016-05-10 LAB — HEPATIC FUNCTION PANEL
ALT: 19 U/L (ref 10–40)
AST: 30 U/L (ref 14–40)
Alkaline Phosphatase: 109 U/L (ref 25–125)
Bilirubin, Total: 3.1 mg/dL

## 2016-05-12 ENCOUNTER — Ambulatory Visit (HOSPITAL_COMMUNITY)
Admission: RE | Admit: 2016-05-12 | Discharge: 2016-05-12 | Disposition: A | Discharge status: Home / Self Care | Payer: Medicare Other | Source: Ambulatory Visit | Attending: Internal Medicine | Admitting: Internal Medicine

## 2016-05-12 DIAGNOSIS — R188 Other ascites: Secondary | ICD-10-CM | POA: Insufficient documentation

## 2016-05-12 MED ORDER — LIDOCAINE HCL 1 % IJ SOLN
INTRAMUSCULAR | Status: AC
Start: 2016-05-12 — End: 2016-05-12
  Filled 2016-05-12: qty 20

## 2016-05-12 MED ORDER — ALBUMIN HUMAN 25 % IV SOLN
40.0000 g | Freq: Once | INTRAVENOUS | Status: AC
  Administered 2016-05-12: 40 g via INTRAVENOUS
  Filled 2016-05-12: qty 200

## 2016-05-12 NOTE — Procedures (Signed)
Successful US guided paracentesis from [facility].  Yielded 4L of clear yellow fluid.  No immediate complications.  Pt tolerated well.   Specimen was not sent for labs.  Ascencion Dike PA-C 05/12/2016 11:03 AM

## 2016-05-15 DIAGNOSIS — G9340 Encephalopathy, unspecified: Secondary | ICD-10-CM | POA: Diagnosis not present

## 2016-05-15 DIAGNOSIS — R188 Other ascites: Secondary | ICD-10-CM | POA: Diagnosis not present

## 2016-05-15 DIAGNOSIS — E876 Hypokalemia: Secondary | ICD-10-CM | POA: Diagnosis not present

## 2016-05-15 DIAGNOSIS — R946 Abnormal results of thyroid function studies: Secondary | ICD-10-CM | POA: Diagnosis not present

## 2016-05-15 DIAGNOSIS — E871 Hypo-osmolality and hyponatremia: Secondary | ICD-10-CM | POA: Diagnosis not present

## 2016-05-15 DIAGNOSIS — Z0000 Encounter for general adult medical examination without abnormal findings: Secondary | ICD-10-CM | POA: Diagnosis not present

## 2016-05-15 DIAGNOSIS — Z1389 Encounter for screening for other disorder: Secondary | ICD-10-CM | POA: Diagnosis not present

## 2016-05-15 DIAGNOSIS — Z23 Encounter for immunization: Secondary | ICD-10-CM | POA: Diagnosis not present

## 2016-05-15 DIAGNOSIS — I4891 Unspecified atrial fibrillation: Secondary | ICD-10-CM | POA: Diagnosis not present

## 2016-05-15 DIAGNOSIS — R4182 Altered mental status, unspecified: Secondary | ICD-10-CM | POA: Diagnosis not present

## 2016-05-16 ENCOUNTER — Telehealth: Payer: Self-pay

## 2016-05-16 NOTE — Telephone Encounter (Signed)
Elizabeth request a copy of patients last colon, faxed them colon and path report from 10/30/2007.

## 2016-05-17 ENCOUNTER — Encounter: Payer: Self-pay | Admitting: Internal Medicine

## 2016-05-17 ENCOUNTER — Non-Acute Institutional Stay: Payer: Medicare Other | Admitting: Internal Medicine

## 2016-05-17 VITALS — BP 128/70 | HR 74 | Temp 98.3°F | Ht 74.0 in | Wt 198.0 lb

## 2016-05-17 DIAGNOSIS — E871 Hypo-osmolality and hyponatremia: Secondary | ICD-10-CM | POA: Diagnosis not present

## 2016-05-17 DIAGNOSIS — C61 Malignant neoplasm of prostate: Secondary | ICD-10-CM

## 2016-05-17 DIAGNOSIS — F1020 Alcohol dependence, uncomplicated: Secondary | ICD-10-CM | POA: Diagnosis not present

## 2016-05-17 DIAGNOSIS — K7290 Hepatic failure, unspecified without coma: Secondary | ICD-10-CM

## 2016-05-17 DIAGNOSIS — K7031 Alcoholic cirrhosis of liver with ascites: Secondary | ICD-10-CM

## 2016-05-17 DIAGNOSIS — I481 Persistent atrial fibrillation: Secondary | ICD-10-CM

## 2016-05-17 DIAGNOSIS — D696 Thrombocytopenia, unspecified: Secondary | ICD-10-CM | POA: Diagnosis not present

## 2016-05-17 DIAGNOSIS — K766 Portal hypertension: Secondary | ICD-10-CM

## 2016-05-17 DIAGNOSIS — I2510 Atherosclerotic heart disease of native coronary artery without angina pectoris: Secondary | ICD-10-CM

## 2016-05-17 DIAGNOSIS — D638 Anemia in other chronic diseases classified elsewhere: Secondary | ICD-10-CM | POA: Diagnosis not present

## 2016-05-17 DIAGNOSIS — K767 Hepatorenal syndrome: Secondary | ICD-10-CM

## 2016-05-17 DIAGNOSIS — F321 Major depressive disorder, single episode, moderate: Secondary | ICD-10-CM | POA: Diagnosis not present

## 2016-05-17 DIAGNOSIS — K7682 Hepatic encephalopathy: Secondary | ICD-10-CM

## 2016-05-17 DIAGNOSIS — I4819 Other persistent atrial fibrillation: Secondary | ICD-10-CM

## 2016-05-17 NOTE — Progress Notes (Signed)
Provider:  Rexene Edison. Mariea Clonts, D.O., C.M.D. Location:  Occupational psychologist of Service:  Clinic (12)  Previous PCP: Hollace Kinnier, DO Patient Care Team: Gayland Curry, DO as PCP - General (Geriatric Medicine) Annia Belt, MD as Consulting Physician (Oncology) Carolan Clines, MD as Consulting Physician (Urology)  Extended Emergency Contact Information Primary Emergency Contact: Carnell,Ellen Address: [address]          [place], [place] [address] Johnnette Litter of [place] Phone: [phone] Mobile Phone: [phone] Relation: Spouse Secondary Emergency Contact: [place] of [place] Mobile Phone: [phone] Relation: Son  Code Status: DNR Goals of Care: Advanced Directive information Advanced Directives 05/17/2016  Does patient have an advance directive? Yes  Type of Advance Directive Out of facility DNR (pink MOST or yellow form)  Does patient want to make changes to advanced directive? -  Copy of advanced directive(s) in chart? Yes  Would patient like information on creating an advanced directive? -  Pre-existing out of facility DNR order (yellow form or pink MOST form) Yellow form placed in chart (order not valid for inpatient use)   Chief Complaint  Patient presents with  . Establish Care    NEW PATIENT    HPI: Patient is a 65 y.o. male patient of Dr. Silvestre Mesi seen today to establish with [facility] since moving to assisted living.  He was at the [facility] [place] for 3 wks before coming here.  He had been at [employer] with family and living in his home outside of [place].  Records have been requested from previous PCP's office.  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  Alcoholic cirrhosis with ascites:  Scrotum looks like a baseball--has difficulty with urination--has to stand to go.  Has seen three doctors who have told him that it's fluid buildup.  Has  had at least 4 paracenteses.  Swelling has plateaued out a day or two after the procedure.  Appetite remains very good.  Had been gaining weight when he moved.  Bowels move well.     Atrial fibrillation:  Has been in this twice.  Dr. Burt Knack is his cardiologist.  He did have cardioversion twice.    Depression:  Had been working before he traveled to [employer] and driving himself.  Does not like to be away from his business--it's better here than [place] b/c his wife is here and family here.  Is on Wellbutrin.  CAD:  He did have a stent placed when he had a cath done about 10 years ago by Dr. Eustace Quail.    Prostate cancer with bone mets:  Diagnosed several years ago.  No pain.  Had been on injections for this.  Dr. Gaynelle Arabian manages.  Dr. [name] also sees him from oncology.  PSA did trend down.  Was on oral agents which were held b/c the meds could affect liver.  Has labs are on Friday.    He does have a backup implant.    Injured his hip about 2 wks ago when he took a fall.  Is now able to ambulate to the nurses' station now.  Feeling much better by his report.    Gets allergy troubles around this time of year--loses his voice around 3pm this season.    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  Quit alcohol about a year ago.  Had been drinking since college.  His wife tells me that he had started drinking small travel bottles of alcohol again (July through his hospitalization).  Now has been w/o it since the end of July truly.    Past Medical History:  Diagnosis Date  . Acute alcoholic hepatitis   . Alcohol abuse with physiological dependence ([place]) 02/13/2012  . Alcoholic hepatitis with ascites 11/13/2014  . Anxiety state, unspecified   . Atrial fibrillation ([place])   . Bacteremia, escherichia coli   . Bisphosphonate-associated  osteonecrosis of the jaw ([place]) 04/28/2013   Localized area base of tooth #18 noted 8/14 by oral surgeon  . Cirrhosis, alcoholic ([place])   . Coronary artery disease    s/p PCI to the LAD in 2011 with a BMS  . Depression   . Dysrhythmia   . Elevated prostate specific antigen (PSA)   . Essential and other specified forms of tremor   . Family history of malignant neoplasm of gastrointestinal tract   . Hepatorenal syndrome ([place]) 02/13/2016  . Hypocalcemia   . Malignant neoplasm of prostate ([place]) 11/2007   Mets to Bone  . Other and unspecified hyperlipidemia   . Personal history of colonic polyps   . Pneumonia    childhood  . Symptomatic anemia 02/13/2016  . Unspecified essential hypertension    Past Surgical History:  Procedure Laterality Date  . CARDIAC CATHETERIZATION    . CARDIOVERSION N/A 08/18/2015   Procedure: CARDIOVERSION;  Surgeon: Thayer Headings, MD;  Location: [facility] ENDOSCOPY;  Service: Cardiovascular;  Laterality: N/A;  . CHOLECYSTECTOMY    . COLONOSCOPY W/ BIOPSIES    . CORONARY STENT PLACEMENT  2011  . TEE WITHOUT CARDIOVERSION N/A 08/18/2015   Procedure: TRANSESOPHAGEAL ECHOCARDIOGRAM (TEE);  Surgeon: Thayer Headings, MD;  Location: [facility] [place];  Service: Cardiovascular;  Laterality: N/A;  . VASECTOMY      Social History   Social History  . Marital status: Married    Spouse name: N/A  . Number of children: 3  . Years of education: N/A   Occupational History  . lawyer     VP of [employer] History Main Topics  . Smoking status: Former Smoker    Types: Cigarettes  . Smokeless tobacco: Never Used     Comment: stopped in college  . Alcohol use 1.2 - 1.8 oz/week    2 - 3 Shots of liquor per week     Comment: has consumed as much as a 1/2 gallon of vodka in a day, now drinks at least 3-5 drinks per day.   . Drug use: No  . Sexual activity: No   Other Topics Concern  . None   Social History Narrative   Patient is married. He is an Forensic psychologist by  training and worked in the [employer] office for several years.  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 He has two sons, one daughter and 5 grandchildren. Both sons work in the business. Daughter lives in [place]. Close knit family.    11/18/2014         Tobacco use, amount per day now: RARELY-- FEW MONTHS WAY BACK   Past tobacco use, amount per day: 25 YEARS AGO   How many years did you use tobacco: SEVERAL MONTHS AGO  Alcohol use (drinks per week): IN RECOVERY   Diet: EATS WELL AND HEALTHY, LOVES SALT, LIMITING IT NOW   Do you drink/eat things with caffeine: RARELY   Marital status: MARRIED                            What year were you married? 1975   Do you live in a house, apartment, assisted living, condo, trailer, etc.? HOUSE   Is it one or more stories? YES, 2 MAIN/ 1 BASEMENT & PLAYROOM AREA/ 1 FINISHED   How many persons live in your home? JUST 2 BUT KIDS ARE OFTEN HERE   Do you have pets in your home?( please list) NO   Current or past profession: FINANCE OFFICER, [employer]/ SOCIAL SECURITY?   Do you exercise?     YES                             Type and how often? NOT MUCH NOW WITH PHYSICAL CHALLENGES   Do you have a living will? YES   Do you have a DNR form?  YES                                 If not, do you want to discuss one?   Do you have signed POA/HPOA forms?    YES                    If so, please bring to you appointment       reports that he has quit smoking. His smoking use included Cigarettes. He has never used smokeless tobacco. He reports that he drinks about 1.2 - 1.8 oz of alcohol per week . He reports that he does not use drugs.   Family History  Problem Relation Age of Onset  . Prostate cancer Father   . Coronary artery disease Father   . Colon cancer Father   . Aneurysm Mother   . Prostate cancer Brother     Health Maintenance  Topic Date Due  . Samul Dada  11/08/1967    . ZOSTAVAX  11/07/2008  . PNA vac Low Risk Adult (1 of 2 - PCV13) 11/07/2013  . INFLUENZA VACCINE  03/07/2016  . COLONOSCOPY  10/29/2017  . Hepatitis C Screening  Completed    No Known Allergies    Medication List       Accurate as of 05/17/16  2:58 PM. Always use your most recent med list.          buPROPion 200 MG 12 hr tablet Commonly known as:  WELLBUTRIN SR Take 200 mg by mouth daily.   CALCIUM + D3 PO Take 1 tablet by mouth 2 (two) times daily.   ciprofloxacin 500 MG tablet Commonly known as:  CIPRO Take 500 mg by mouth daily.   CVS BALANCED B-100 PO Take 1 tablet by mouth daily.   folic acid 1 MG tablet Commonly known as:  FOLVITE Take 1 mg by mouth daily.   furosemide 40 MG tablet Commonly known as:  LASIX Take 1 tablet (40 mg total) by mouth daily.   ipratropium-albuterol 0.5-2.5 (3) MG/3ML Soln Commonly known as:  DUONEB Take 3 mLs by nebulization every 4 (four) hours as needed.   lactulose 10 GM/15ML solution Commonly known as:  CHRONULAC Take 45 mLs (30  g total) by mouth 2 (two) times daily.   LORazepam 0.5 MG tablet Commonly known as:  ATIVAN Take 0.5 mg by mouth once a week.   multivitamin with minerals Tabs tablet Take 1 tablet by mouth daily with breakfast.   oxyCODONE 5 MG immediate release tablet Commonly known as:  Oxy IR/ROXICODONE Take 1-2 tablets (5-10 mg total) by mouth 2 (two) times daily as needed for severe pain.   rifaximin 550 MG Tabs tablet Commonly known as:  XIFAXAN Take 1 tablet (550 mg total) by mouth 2 (two) times daily.   spironolactone 100 MG tablet Commonly known as:  ALDACTONE Take 1 tablet (100 mg total) by mouth daily.   thiamine 100 MG tablet Take 100 mg by mouth daily.   zinc oxide 20 % ointment Apply 1 application topically as needed for irritation.       Review of Systems  Constitutional: Positive for malaise/fatigue. Negative for chills and fever.  HENT: Positive for congestion. Negative for  sore throat and tinnitus.        Seasonal allergies  Eyes: Negative for blurred vision.       Glasses  Respiratory: Negative for cough and shortness of breath.   Cardiovascular: Negative for chest pain, palpitations and leg swelling.  Gastrointestinal: Negative for abdominal pain, blood in stool, constipation, diarrhea, heartburn, melena, nausea and vomiting.       Ascites  Genitourinary: Positive for frequency. Negative for dysuria.       Scrotal swelling due to his cirrhosis  Musculoskeletal: Positive for falls and joint pain.  Skin: Negative for itching and rash.  Neurological: Positive for weakness. Negative for dizziness, tremors and loss of consciousness.  Endo/Heme/Allergies: Bruises/bleeds easily.  Psychiatric/Behavioral: Positive for depression and memory loss. The patient is not nervous/anxious and does not have insomnia.     Vitals:   05/17/16 1447  BP: 128/70  Pulse: 74  Temp: 98.3 F (36.8 C)  TempSrc: Oral  SpO2: 98%  Weight: 198 lb (89.8 kg)  Height: 6\' 2"  (1.88 m)   Body mass index is 25.42 kg/m. Physical Exam  Constitutional: No distress.  Jaundice, chronically ill appearing male sitting in wheelchair  HENT:  Head: Normocephalic and atraumatic.  Mouth/Throat: No oropharyngeal exudate.  Eyes: EOM are normal. Pupils are equal, round, and reactive to light. Scleral icterus is present.  glasses  Neck: Neck supple.  Cardiovascular: Normal rate, regular rhythm, normal heart sounds and intact distal pulses.   Pulmonary/Chest: Effort normal and breath sounds normal. He has no rales.  Abdominal: Soft. Bowel sounds are normal. He exhibits distension. He exhibits no mass. There is no tenderness. There is no rebound and no guarding. No hernia.  Ascites, not tense at this point, but is present  Musculoskeletal: Normal range of motion.  Neurological: He is alert. No cranial nerve deficit.  Oriented to person and place, did not provide a completely accurate history upon  discussing with his wife and nursing staff  Skin: Skin is warm and dry. Capillary refill takes less than 2 seconds.  jaundice  Psychiatric:  Flat affect    Labs reviewed: Basic Metabolic Panel:  Recent Labs  08/16/15 1804  02/13/16 1415  03/28/16 1642 04/04/16 1030 04/06/16 1057 04/15/16 1639 05/10/16 0300  NA 134*  < >  --   < > 125* 129* 129* 134* 133*  K 3.5  < >  --   < > 4.9 4.7 5.2* 4.4 3.7  CL 99*  < >  --   < >  94* 96*  --  97*  --   CO2 22  < >  --   < > 24 24 24   --   --   GLUCOSE 110*  < >  --   < > 104* 73 81 83  --   BUN 9  < >  --   < > 32* 35* 32.4* 20 10  CREATININE 1.07  < >  --   < > 1.40* 1.37* 1.4* 1.20 1.0  CALCIUM 8.8*  < >  --   < > 7.8* 8.1* 8.2*  --   --   MG 2.1  --  2.5*  --   --   --   --   --   --   PHOS  --   --  4.7*  --   --   --   --   --   --   < > = values in this interval not displayed. Liver Function Tests:  Recent Labs  03/28/16 1642 04/04/16 1030 04/06/16 1057 05/10/16 0300  AST 49* 44* 44* 30  ALT 54 42 43 19  ALKPHOS 157* 142* 172* 109  BILITOT 4.4* 3.9* 4.17*  --   PROT 5.2* 5.2* 5.7*  --   ALBUMIN 2.4* 2.4* 2.4*  --    No results for input(s): LIPASE, AMYLASE in the last 8760 hours.  Recent Labs  02/13/16 0936 02/14/16 0528 04/04/16 1500  AMMONIA 19 61* 65*   CBC:  Recent Labs  03/28/16 1642 04/04/16 1030 04/06/16 1057 04/15/16 1639  WBC 14.2* 12.7* 11.9*  --   NEUTROABS 12.0* 9.6* 9.9*  --   HGB 9.5* 9.3* 9.5* 8.8*  HCT 26.8* 26.6* 27.3* 26.0*  MCV 117.3* 118.2* 113.8*  --   PLT 158 107* 111*  --    Cardiac Enzymes: No results for input(s): CKTOTAL, CKMB, CKMBINDEX, TROPONINI in the last 8760 hours. BNP: Invalid input(s): POCBNP Lab Results  Component Value Date   HGBA1C 4.6 (L) 12/21/2014   Lab Results  Component Value Date   TSH 3.376 08/16/2015   Lab Results  Component Value Date   VITAMINB12 1,461 (H) 08/20/2015   Lab Results  Component Value Date   FOLATE >20.0 ng/mL 03/25/2009    Lab Results  Component Value Date   IRON 79 03/25/2009   FERRITIN 504.8 (H) 03/25/2009    Imaging and Procedures noted on new patient packet: None listed  Assessment/Plan 1. Alcoholic cirrhosis of liver with ascites ([place]) -lends poor prognosis with his frequency of recurrent ascites -he's on sbp prophylaxis with cipro, lasix to help with volume overload, xifaxan and lactulose, thiamine , folic acid and has not been drinking since hospitalization -getting q 2 wk thoracenteses from Dr. Bridgette Habermann al at GI  2. Portal hypertension (HCC) -cont beta blocker, no evidence of bleeding from this  3. Atherosclerosis of native coronary artery of native heart without angina pectoris -stable and asymptomatic, cont asa 81mg , beta blocker and monitor, not on statin at this time  4. Persistent atrial fibrillation (HCC) -cont baby asa, nadolol -has been cardioverted twice in the past but has returned to afib -not a candidate for anticoagulation at this point with his portal htn, low platelets and falling  5. Encephalopathy, hepatic ([place]) -has had this due to his cirrhosis, as well, cont xifaxan and lactulose to help prevent recurrence and if mentation becomes altered would check ammonia and electrolytes  6. Prostate cancer, primary, with metastasis from prostate to other site [facility]) -  not on any active therapy for this at this time; followed by Dr. [name]  7. Alcohol abuse with physiological dependence ([place]) -has not had a drink since his last hospitalization, cont to monitor and avoid alcohol with evening meal that many residents have here  8. Thrombocytopenia ([place]) -due to cirrhosis, cont to monitor, at risk of bleeding avoid anticoagulants due to this   9. Hyponatremia -due to cirrhosis, monitor, certainly can alter his cognition also  10. Anemia of chronic disease -related to his cirrhosis also, cont to follow  11. Hepatorenal syndrome ([place]) -has had some difficulty with renal  failure due to his cirrhosis and monitoring carefully  12. 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    Coreena Rubalcava L. Mabel Unrein, D.O. [facility] [address], [place] [address] Cell Phone (Mon-Fri 8am-5pm):  [phone] On Call:  [phone] & follow prompts after 5pm & weekends Office Phone:  [phone] Office Fax:  [phone]

## 2016-05-19 ENCOUNTER — Encounter: Payer: Self-pay | Admitting: Internal Medicine

## 2016-05-19 ENCOUNTER — Other Ambulatory Visit: Payer: Medicare Other

## 2016-05-19 DIAGNOSIS — C61 Malignant neoplasm of prostate: Secondary | ICD-10-CM | POA: Diagnosis not present

## 2016-05-19 DIAGNOSIS — D6959 Other secondary thrombocytopenia: Secondary | ICD-10-CM | POA: Diagnosis not present

## 2016-05-23 ENCOUNTER — Telehealth: Payer: Self-pay | Admitting: Oncology

## 2016-05-23 ENCOUNTER — Encounter: Payer: Self-pay | Admitting: Physician Assistant

## 2016-05-23 ENCOUNTER — Ambulatory Visit (HOSPITAL_BASED_OUTPATIENT_CLINIC_OR_DEPARTMENT_OTHER): Payer: Medicare Other | Admitting: Oncology

## 2016-05-23 VITALS — BP 134/89 | HR 108 | Temp 98.4°F | Resp 18 | Ht 74.0 in | Wt 188.1 lb

## 2016-05-23 DIAGNOSIS — K7031 Alcoholic cirrhosis of liver with ascites: Secondary | ICD-10-CM

## 2016-05-23 DIAGNOSIS — R17 Unspecified jaundice: Secondary | ICD-10-CM | POA: Diagnosis not present

## 2016-05-23 DIAGNOSIS — C7951 Secondary malignant neoplasm of bone: Secondary | ICD-10-CM

## 2016-05-23 DIAGNOSIS — C61 Malignant neoplasm of prostate: Secondary | ICD-10-CM

## 2016-05-23 NOTE — Telephone Encounter (Signed)
Gave patient avs report and appointments for December  °

## 2016-05-23 NOTE — Progress Notes (Signed)
Hematology and Oncology Follow Up Visit  THAINE SOUTHERS [id number] 03-27-1951 65 y.o. 05/23/2016 1:14 PM REED, Jonelle Sidle, DOPerini, [name], MD   Principle Diagnosis: 65 year old gentleman with castration resistant prostate cancer with metastatic disease to the bone. He was initially diagnosed in October of 2009 with bony metastasis.   Prior Therapy: He was treated with hormonal therapy with monthly Fermagon injections and monthly Zometa infusions. PSA fell from 72.9 in October 2009 to a nadir of 0.24 by June of 2012.  PSA began to rise again and was 4.09 on 02/06/2012. Bone scan at that point did not show any obvious new lesions. He was started on Xtandi and once again has had a very nice response since.   Current therapy: Xtandi 160 mg daily started September of 2013 with an excellent response PSA is close to 0. Has been on hold since 11/2014 due to liver disease.  This was resumed on 07/08/2015. Therapy was withheld in July 2017 because of acute decompensation of his liver disease.  Interim History:  Mr. Frix presents today for a followup visit. Since the last visit, he continues to reside in an assisted living facility because of his overall health decline. His issues are predominantly related to his liver disease and fluid retention. He continues to have reaccumulation of ascites that is drained periodically. He also has reported some scrotal edema and lower extremity edema that has improved with diuretics. His mobility is limited utilizing a walker and have not had any falls or syncope.  He has been off Xtandi since his hospitalization and denied any bone pain or pathological fractures. He does not report any urological symptoms.  He reports no headaches, blurry vision or other neurological symptoms. He denied any alteration of mental status or syncope. He does not report any fevers or chills or sweats. He does not report any chest pain, palpitation. He does not report any cough or hemoptysis. He  does not report any nausea, vomiting, constipation or hematochezia. Has not reported any genitourinary complaints, petechiae or lymphadenopathy.  The rest of his review of systems unremarkable.   Medications: I have reviewed the patient's current medications.  Current Outpatient Prescriptions  Medication Sig Dispense Refill  . buPROPion (WELLBUTRIN SR) 200 MG 12 hr tablet Take 200 mg by mouth daily.    . Calcium Carb-Cholecalciferol (CALCIUM + D3 PO) Take 1 tablet by mouth 2 (two) times daily.    . ciprofloxacin (CIPRO) 500 MG tablet Take 500 mg by mouth daily.    . folic acid (FOLVITE) 1 MG tablet Take 1 mg by mouth daily.  11  . furosemide (LASIX) 40 MG tablet Take 1 tablet (40 mg total) by mouth daily. 30 tablet 11  . ipratropium-albuterol (DUONEB) 0.5-2.5 (3) MG/3ML SOLN Take 3 mLs by nebulization every 4 (four) hours as needed.    . lactulose (CHRONULAC) 10 GM/15ML solution Take 45 mLs (30 g total) by mouth 2 (two) times daily. 240 mL 0  . LORazepam (ATIVAN) 0.5 MG tablet Take 0.5 mg by mouth once a week.     . Multiple Vitamin (MULTIVITAMIN WITH MINERALS) TABS tablet Take 1 tablet by mouth daily with breakfast.    . oxyCODONE (OXY IR/ROXICODONE) 5 MG immediate release tablet Take 1-2 tablets (5-10 mg total) by mouth 2 (two) times daily as needed for severe pain. 30 tablet 0  . rifaximin (XIFAXAN) 550 MG TABS tablet Take 1 tablet (550 mg total) by mouth 2 (two) times daily. 60 tablet 0  . spironolactone (ALDACTONE)  25 MG tablet Take 25 mg by mouth daily.  11  . thiamine 100 MG tablet Take 100 mg by mouth daily.    . Vitamins-Lipotropics (CVS BALANCED B-100 PO) Take 1 tablet by mouth daily.    [name] zinc oxide 20 % ointment Apply 1 application topically as needed for irritation.     No current facility-administered medications for this visit.      Allergies: No Known Allergies  Past Medical History, Surgical history, Social history, and Family History were reviewed and  updated.   Physical Exam: Blood pressure 134/89, pulse (!) 108, temperature 98.4 F (36.9 C), temperature source Oral, resp. rate 18, height 6\' 2"  (1.88 m), weight 188 lb 1.6 oz (85.3 kg), SpO2 99 %. ECOG: 2 General appearance: Ill-appearing gentleman appeared without distress. Head: Normocephalic, without obvious abnormality no oral ulcers or lesions. Sclera icterus noted. Neck: no adenopathy no thyromegaly. Lymph nodes: Cervical, supraclavicular, and axillary nodes normal. Heart:regular rate and rhythm, S1, S2 normal, no murmur, click, rub or gallop Lung:chest clear, no wheezing, rales, normal symmetric air entry Abdomin: soft, non-tender, without masses or organomegaly. No shifting dullness with very little ascites noted.. EXT:no erythema, induration, or nodules. Trace edema noted. Neurological examination showed no deficits.   Lab Results: Lab Results  Component Value Date   WBC 11.9 (H) 04/06/2016   HGB 8.8 (L) 04/15/2016   HCT 26.0 (L) 04/15/2016   MCV 113.8 (H) 04/06/2016   PLT 111 (L) 04/06/2016     Chemistry      Component Value Date/Time   NA 133 (A) 05/10/2016 0300   NA 129 (L) 04/06/2016 1057   K 3.7 05/10/2016 0300   K 5.2 (H) 04/06/2016 1057   CL 97 (L) 04/15/2016 1639   CL 100 12/10/2012 1540   CO2 24 04/06/2016 1057   BUN 10 05/10/2016 0300   BUN 32.4 (H) 04/06/2016 1057   CREATININE 1.0 05/10/2016 0300   CREATININE 1.20 04/15/2016 1639   CREATININE 1.4 (H) 04/06/2016 1057   GLU 97 05/10/2016 0300      Component Value Date/Time   CALCIUM 8.2 (L) 04/06/2016 1057   ALKPHOS 109 05/10/2016 0300   ALKPHOS 172 (H) 04/06/2016 1057   AST 30 05/10/2016 0300   AST 44 (H) 04/06/2016 1057   ALT 19 05/10/2016 0300   ALT 43 04/06/2016 1057   BILITOT 4.17 ([place]) 04/06/2016 1057       Results for BREXTEN, CALERO (MRN [id number]) as of 04/12/2016 13:33  Ref. Range 03/21/2016 07:12 04/06/2016 10:57  PSA Latest Ref Range: 0.0 - 4.0 ng/mL 0.04 <0.1           Impression and Plan:  66 year old on with the following issues:  1. Castration resistant prostate cancer with metastatic disease to the bone. His continued on Xtandi with excellent response and a PSA of less than 0.01. I do not think his Gillermina Phy is contributing to his jaundice and his hepatic failure.   Gillermina Phy was resumed in December 2016 For 6 months and therapy was stopped in July 2017 because of acute decompensation of his liver disease.  His PSA continues to be close to 0 and I see no need to restart Xtandi at this time. Given his overall health status, his cancer appears to be reasonably controlled.   2. Jaundice related to alcoholic liver cirrhosis: His bilirubin appears to be improving at this time.  3. Androgen deprivation: He is currently on androgen depravation. I recommend this continues for the time being. His  Su Hilt has been replaced by Dr. Gaynelle Arabian in the near future.  4. Atrial fibrillation: He is currently in normal sinus rhythm and chronically anticoagulated with Pradaxa. no bleeding complications noted.   5. Followup: Will be in 8  weeks for a recheck of his PSA at that time.  [id number], MD 10/17/20171:14 PM

## 2016-05-24 ENCOUNTER — Telehealth: Payer: Self-pay | Admitting: Internal Medicine

## 2016-05-24 ENCOUNTER — Other Ambulatory Visit: Payer: Self-pay

## 2016-05-24 DIAGNOSIS — R188 Other ascites: Secondary | ICD-10-CM

## 2016-05-24 NOTE — Telephone Encounter (Signed)
Paracentesis has been scheduled for 05/26/16 10:00 at [facility]

## 2016-05-24 NOTE — Telephone Encounter (Signed)
Patient's wife notified that paracentesis should be every 2 weeks.  I advised her that [facility] has been arranging the paracentesis.  She will check with them about arranging the next paracentesis

## 2016-05-26 ENCOUNTER — Ambulatory Visit (HOSPITAL_COMMUNITY)
Admission: RE | Admit: 2016-05-26 | Discharge: 2016-05-26 | Disposition: A | Discharge status: Home / Self Care | Payer: Medicare Other | Source: Ambulatory Visit | Attending: Internal Medicine | Admitting: Internal Medicine

## 2016-05-26 DIAGNOSIS — R188 Other ascites: Secondary | ICD-10-CM | POA: Diagnosis not present

## 2016-05-26 DIAGNOSIS — K7031 Alcoholic cirrhosis of liver with ascites: Secondary | ICD-10-CM | POA: Diagnosis not present

## 2016-05-26 MED ORDER — LIDOCAINE HCL 1 % IJ SOLN
INTRAMUSCULAR | Status: AC
  Filled 2016-05-26: qty 20

## 2016-05-26 MED ORDER — ALBUMIN HUMAN 25 % IV SOLN
37.5000 g | Freq: Once | INTRAVENOUS | Status: AC
Start: 2016-05-26 — End: 2016-05-26
  Administered 2016-05-26: 37.5 g via INTRAVENOUS
  Filled 2016-05-26: qty 150

## 2016-05-26 NOTE — Procedures (Signed)
Ultrasound-guided  therapeutic paracentesis performed yielding 3.4 liters of slightly hazy, yellow  fluid. No immediate complications. He will receive IV albumin postprocedure.

## 2016-05-29 ENCOUNTER — Other Ambulatory Visit: Payer: Self-pay | Admitting: *Deleted

## 2016-05-29 NOTE — Progress Notes (Signed)
rec'd request from [facility], they can do labs prior to patient's visit on 07/25/16. Faxed order to do cbc/diff, c-met and PSA to [phone]

## 2016-06-01 DIAGNOSIS — D225 Melanocytic nevi of trunk: Secondary | ICD-10-CM | POA: Diagnosis not present

## 2016-06-01 DIAGNOSIS — D2261 Melanocytic nevi of right upper limb, including shoulder: Secondary | ICD-10-CM | POA: Diagnosis not present

## 2016-06-01 DIAGNOSIS — Z86018 Personal history of other benign neoplasm: Secondary | ICD-10-CM | POA: Diagnosis not present

## 2016-06-01 DIAGNOSIS — Z23 Encounter for immunization: Secondary | ICD-10-CM | POA: Diagnosis not present

## 2016-06-05 ENCOUNTER — Ambulatory Visit (INDEPENDENT_AMBULATORY_CARE_PROVIDER_SITE_OTHER): Payer: Medicare Other | Admitting: Physician Assistant

## 2016-06-05 ENCOUNTER — Encounter: Payer: Self-pay | Admitting: Physician Assistant

## 2016-06-05 ENCOUNTER — Encounter (INDEPENDENT_AMBULATORY_CARE_PROVIDER_SITE_OTHER): Payer: Self-pay

## 2016-06-05 VITALS — BP 110/70 | HR 101 | Ht 74.0 in | Wt 174.8 lb

## 2016-06-05 DIAGNOSIS — I481 Persistent atrial fibrillation: Secondary | ICD-10-CM | POA: Diagnosis not present

## 2016-06-05 DIAGNOSIS — I10 Essential (primary) hypertension: Secondary | ICD-10-CM | POA: Diagnosis not present

## 2016-06-05 DIAGNOSIS — I2510 Atherosclerotic heart disease of native coronary artery without angina pectoris: Secondary | ICD-10-CM | POA: Diagnosis not present

## 2016-06-05 DIAGNOSIS — K7031 Alcoholic cirrhosis of liver with ascites: Secondary | ICD-10-CM

## 2016-06-05 DIAGNOSIS — I4819 Other persistent atrial fibrillation: Secondary | ICD-10-CM

## 2016-06-05 MED ORDER — NADOLOL 20 MG PO TABS: 20.0000 mg | ORAL_TABLET | Freq: Every day | ORAL | Status: AC

## 2016-06-05 NOTE — Patient Instructions (Addendum)
Medication Instructions:  1. START NADOLOL 20 MG DAILY  Labwork: NONE  Testing/Procedures: NONE  Follow-Up: Your physician wants you to follow-up in: [place] DR. Burt Knack. You will receive a reminder letter in the mail two months in advance. If you don't receive a letter, please call our office to schedule the follow-up appointment.  Any Other Special Instructions Will Be Listed Below (If Applicable).  If you need a refill on your cardiac medications before your next appointment, please call your pharmacy.

## 2016-06-05 NOTE — Progress Notes (Signed)
Cardiology Office Note:    Date:  06/05/2016   ID:  Allen Schroeder, DOB 1951-07-01, MRN [id number]  PCP:  [name], MD  Cardiologist:  Dr. Sherren Mocha   Electrophysiologist:  N/a Oncologist: Dr. [name] GI: Dr. Carlean Purl  Referring MD: [name], MD   Chief Complaint  Patient presents with  . Follow-up    AFib, CAD    History of Present Illness:    Allen Schroeder is a 65 y.o. male with a hx of CAD s/p prior PCI to the LAD in 2011, PAF, metastatic prostate CA s/p orchiectomy, alcoholism with alcoholic cirrhosis.  He is not a candidate for anticoagulation.  He previously maintained NSR on Sotalol.  This was DC'd during an admission in 5/16 with E coli sepsis. He was then admitted in 1/17 with AF with RVR and underwent TEE-DCCV.  His Sotalol was restarted.  He was placed on Pradaxa for 1 month post DCCV.  Last seen by Dr. Sherren Mocha in 4/17. He was on ASA 81 mg QD alone at that time.    He developed ascites in 6/17 and was seen by GI.  He underwent paracentesis with removal of 8.9 L.  He was then admitted 7/9-7/14 with hepatorenal syndrome with large volume ascites, hyponatremia, elevated Creatinine and coagulopathy.  He underwent thoracentesis again with removal of 5 L.  His Creatinine was high on admit and his diuretics were held.  Of note, INR was 1.87.  Sotalol was DC'd 2/2 to prolonged QTc.  He was in NSR at that time.  He was DC to SNF.   He has undergone repeat, large volume, thoracenteses since that time.  He is now a DNR and Dr. Carlean Purl has him on a regimen of Q 2 week paracentesis with albumin infusion.   He returns for FU.  He is currently staying at [facility]  He is here with his nursing aid. He denies chest pain or significant shortness of breath.  He denies orthopnea, PND, edema.  He denies syncope.    Prior CV studies that were reviewed today include:    TEE 08/18/15 EF 50-55%, mild MR, no LAA clot  Echo 12/25/14 EF 55-60%, MAC, mild LAE, L pleural  eff  LHC 8/11 LAD prox ulcerated plaque 50-60%, small mid D2 90% LCx normal RCA mid to dist 40-50% EF 60%  PCI 8/11 4 x 12 mm Vision BMS to prox LAD  Past Medical History:  Diagnosis Date  . Acute alcoholic hepatitis   . Alcohol abuse with physiological dependence ([place]) 02/13/2012  . Alcoholic hepatitis with ascites 11/13/2014  . Anxiety state, unspecified   . Atrial fibrillation ([place])   . Bacteremia, escherichia coli   . Bisphosphonate-associated osteonecrosis of the jaw ([place]) 04/28/2013   Localized area base of tooth #18 noted 8/14 by oral surgeon  . Cirrhosis, alcoholic ([place])   . Coronary artery disease    s/p PCI to the LAD in 2011 with a BMS  . Depression   . Dysrhythmia   . Elevated prostate specific antigen (PSA)   . Essential and other specified forms of tremor   . Family history of malignant neoplasm of gastrointestinal tract   . Hepatorenal syndrome ([place]) 02/13/2016  . Hypocalcemia   . Malignant neoplasm of prostate ([place]) 11/2007   Mets to Bone  . Other and unspecified hyperlipidemia   . Personal history of colonic polyps   . Pneumonia    childhood  . Symptomatic anemia 02/13/2016  . Unspecified essential hypertension  Past Surgical History:  Procedure Laterality Date  . CARDIAC CATHETERIZATION    . CARDIOVERSION N/A 08/18/2015   Procedure: CARDIOVERSION;  Surgeon: Thayer Headings, MD;  Location[facility] ENDOSCOPY;  Service: Cardiovascular;  Laterality: N/A;  . CHOLECYSTECTOMY    . COLONOSCOPY W/ BIOPSIES    . CORONARY STENT PLACEMENT  2011  . TEE WITHOUT CARDIOVERSION N/A 08/18/2015   Procedure: TRANSESOPHAGEAL ECHOCARDIOGRAM (TEE);  Surgeon: Thayer Headings, MD;  Location: [facility] ENDOSCOPY;  Service: Cardiovascular;  Laterality: N/A;  . VASECTOMY      Current Medications: Current Meds  Medication Sig  . buPROPion (WELLBUTRIN SR) 200 MG 12 hr tablet Take 200 mg by mouth daily.  . Calcium Carb-Cholecalciferol (CALCIUM + D3 PO) Take 1 tablet by mouth 2 (two) times  daily.  . ciprofloxacin (CIPRO) 500 MG tablet Take 500 mg by mouth daily.  . folic acid (FOLVITE) 1 MG tablet Take 1 mg by mouth daily.  . furosemide (LASIX) 40 MG tablet Take 1 tablet (40 mg total) by mouth daily.  [name] ipratropium-albuterol (DUONEB) 0.5-2.5 (3) MG/3ML SOLN Take 3 mLs by nebulization every 4 (four) hours as needed.  . lactulose (CHRONULAC) 10 GM/15ML solution Take 45 mLs (30 g total) by mouth 2 (two) times daily.  [name] LORazepam (ATIVAN) 0.5 MG tablet Take 0.5 mg by mouth once a week.   . Multiple Vitamin (MULTIVITAMIN WITH MINERALS) TABS tablet Take 1 tablet by mouth daily with breakfast.  . oxyCODONE (OXY IR/ROXICODONE) 5 MG immediate release tablet Take 1-2 tablets (5-10 mg total) by mouth 2 (two) times daily as needed for severe pain.  . rifaximin (XIFAXAN) 550 MG TABS tablet Take 1 tablet (550 mg total) by mouth 2 (two) times daily.  [name] spironolactone (ALDACTONE) 100 MG tablet Take 100 mg by mouth daily.   [name] thiamine 100 MG tablet Take 100 mg by mouth daily.  . Vitamins-Lipotropics ([employer] BALANCED B-100 PO) Take 1 tablet by mouth daily.  [name] zinc oxide 20 % ointment Apply 1 application topically as needed for irritation.     Allergies:   Review of patient's allergies indicates no known allergies.   Social History   Social History  . Marital status: Married    Spouse name: N/A  . Number of children: 3  . Years of education: N/A   Occupational History  . lawyer     [employer] History Main Topics  . Smoking status: Former Smoker    Types: Cigarettes  . Smokeless tobacco: Never Used     Comment: stopped in college  . Alcohol use 1.2 - 1.8 oz/week    2 - 3 Shots of liquor per week     Comment: has consumed as much as a 1/2 gallon of vodka in a day, now drinks at least 3-5 drinks per day.   . Drug use: No  . Sexual activity: No   Other Topics Concern  . None   Social History Narrative   Patient is married. He is an Forensic psychologist by training and  worked in the [employer] office for several years.  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 He has two sons, one daughter and 5 grandchildren. Both sons work in the business. Daughter lives in [place]. Close knit family.    11/18/2014         Tobacco use, amount per day now: RARELY-- FEW MONTHS WAY BACK   Past  tobacco use, amount per day: 25 YEARS AGO   How many years did you use tobacco: SEVERAL MONTHS AGO   Alcohol use (drinks per week): IN RECOVERY   Diet: EATS WELL AND HEALTHY, LOVES SALT, LIMITING IT NOW   Do you drink/eat things with caffeine: RARELY   Marital status: MARRIED                            What year were you married? 1975   Do you live in a house, apartment, assisted living, condo, trailer, etc.? HOUSE   Is it one or more stories? YES, 2 MAIN/ 1 BASEMENT & PLAYROOM AREA/ 1 FINISHED   How many persons live in your home? JUST 2 BUT KIDS ARE OFTEN HERE   Do you have pets in your home?( please list) NO   Current or past profession: FINANCE OFFICER, [employer]/ [employer]?   Do you exercise?     YES                             Type and how often? NOT MUCH NOW WITH PHYSICAL CHALLENGES   Do you have a living will? YES   Do you have a DNR form?  YES                                 If not, do you want to discuss one?   Do you have signed POA/HPOA forms?    YES                    If so, please bring to you appointment        Family History:  The patient's family history includes Aneurysm in his mother; Colon cancer in his father; Coronary artery disease in his father; Prostate cancer in his brother and father.   ROS:   Please see the history of present illness.    Review of Systems  Cardiovascular: Positive for dyspnea on exertion.  Hematologic/Lymphatic: Bruises/bleeds easily.  Musculoskeletal: Positive for back pain and myalgias.  Neurological: Positive for loss of balance.   Psychiatric/Behavioral: Positive for depression. The patient is nervous/anxious.    All other systems reviewed and are negative.   EKGs/Labs/Other Test Reviewed:    EKG:  EKG is  ordered today.  The ekg ordered today demonstrates Sinus tachycardia, HR 101, QTc 448 ms, increased artifact  Recent Labs: 08/16/2015: TSH 3.376 02/13/2016: Magnesium 2.5 04/06/2016: Platelets 111 04/15/2016: Hemoglobin 8.8 05/10/2016: ALT 19; BUN 10; Creatinine 1.0; Potassium 3.7; Sodium 133   Recent Lipid Panel    Component Value Date/Time   CHOL 156 11/04/2012 0807   TRIG 200.0 (H) 11/04/2012 0807   HDL 54.60 11/04/2012 0807   CHOLHDL 3 11/04/2012 0807   VLDL 40.0 11/04/2012 0807   LDLCALC 61 11/04/2012 0807   LDLDIRECT 95.6 05/11/2010 1021     Physical Exam:    VS:  BP 110/70   Pulse (!) 101   Ht 6\' 2"  (1.88 m)   Wt 174 lb 12.8 oz (79.3 kg)   BMI 22.44 kg/m     Wt Readings from Last 3 Encounters:  06/05/16 174 lb 12.8 oz (79.3 kg)  05/23/16 188 lb 1.6 oz (85.3 kg)  05/17/16 198 lb (89.8 kg)     Physical Exam  Constitutional: He is oriented to  person, place, and time. He appears well-developed and well-nourished. No distress.  HENT:  Head: Normocephalic and atraumatic.  Eyes: Scleral icterus is present.  Neck: No JVD present.  Cardiovascular: Normal rate, regular rhythm and normal heart sounds.   No murmur heard. Pulmonary/Chest: Effort normal. He has no wheezes. He has no rales.  Abdominal: Soft. He exhibits distension. There is no tenderness.  Musculoskeletal: He exhibits no edema.  Neurological: He is alert and oriented to person, place, and time.  Skin: Skin is warm and dry.  Psychiatric: He has a normal mood and affect.    ASSESSMENT:    1. Persistent atrial fibrillation ([place])   2. Coronary artery disease involving native coronary artery of native heart without angina pectoris   3. Essential hypertension   4. Alcoholic cirrhosis of liver with ascites ([place])    PLAN:    In  order of problems listed above:  1. Persistent AF -  He previously has undergone DCCV and was maintained on Sotalol in NSR for quite some time. He went back into AF when his Sotalol was DC'd last year during an admission for E coli sepsis. He underwent DCCV and was put back on Sotalol in 1/17.  This was again DC'd 2/2 prolonged QT (> 500 ms) this past summer during an admission with hepatorenal syndrome.  His INR has been as high as 2.04.  But, the last INR was 1.59.  His coagulopathy is secondary to his chronic liver disease.  He is not a candidate for anticoagulation.  I do not think that resuming Sotalol is indicated at this time given his comorbid conditions with chronic liver disease. However, he would likely benefit from being on a non-selective beta-blocker like Nadolol especially since he is at risk to have esophageal varices.    -  Start Nadolol 20 mg QD     2. CAD - s/p PCI to LAD in 2011 with BMS.  No angina. Risk of bleeding with chronic liver disease likely too great to consider resuming ASA. I am not certain if varices have been documented in the past.  I will forward my note to Dr. Carlean Purl to get his opinion.    3. HTN - BP is controlled.   4. Alcoholic Cirrhosis - Managed by GI.  He is on a regimen of Q 2 week paracentesis.  He is DNR.      Medication Adjustments/Labs and Tests Ordered: Current medicines are reviewed at length with the patient today.  Concerns regarding medicines are outlined above.  Medication changes, Labs and Tests ordered today are outlined in the Patient Instructions noted below. Patient Instructions  Medication Instructions:  1. START NADOLOL 20 MG DAILY  Labwork: NONE  Testing/Procedures: NONE  Follow-Up: Your physician wants you to follow-up in: [place] DR. Burt Knack. You will receive a reminder letter in the mail two months in advance. If you don't receive a letter, please call our office to schedule the follow-up appointment.  Any Other Special  Instructions Will Be Listed Below (If Applicable).  If you need a refill on your cardiac medications before your next appointment, please call your pharmacy.  Signed, Richardson Dopp, PA-C  06/05/2016 2:43 PM    [facility] [address], [place], [place]  [address] Phone: [phone]; Fax: [phone]

## 2016-06-08 ENCOUNTER — Other Ambulatory Visit: Payer: Self-pay

## 2016-06-08 MED ORDER — OXYCODONE HCL 10 MG PO TABS: 10.0000 mg | ORAL_TABLET | Freq: Every day | ORAL | 0 refills | Status: DC

## 2016-06-08 NOTE — Telephone Encounter (Signed)
Faxed to [facility] Fax Number: [phone], Phone Number [phone]  

## 2016-06-12 ENCOUNTER — Other Ambulatory Visit: Payer: Self-pay | Admitting: Internal Medicine

## 2016-06-12 ENCOUNTER — Ambulatory Visit (HOSPITAL_COMMUNITY)
Admission: RE | Admit: 2016-06-12 | Discharge: 2016-06-12 | Disposition: A | Discharge status: Home / Self Care | Payer: Medicare Other | Source: Ambulatory Visit | Attending: Internal Medicine | Admitting: Internal Medicine

## 2016-06-12 DIAGNOSIS — R188 Other ascites: Secondary | ICD-10-CM | POA: Diagnosis not present

## 2016-06-12 NOTE — Progress Notes (Signed)
Patient ID: Allen Schroeder, male   DOB: Mar 20, 1951, 65 y.o.   MRN: [id number]  US guided paracentesis requested today  Limited US Abd performed which showed tiny amount of fluid in LLQ No other fluid seen  No Paracentesis performed today   Pt sent back to [facility] Pt agreeable

## 2016-06-21 ENCOUNTER — Non-Acute Institutional Stay: Payer: Medicare Other | Admitting: Internal Medicine

## 2016-06-21 ENCOUNTER — Encounter: Payer: Self-pay | Admitting: Internal Medicine

## 2016-06-21 VITALS — BP 118/70 | HR 83 | Temp 98.1°F | Wt 174.0 lb

## 2016-06-21 DIAGNOSIS — K7031 Alcoholic cirrhosis of liver with ascites: Secondary | ICD-10-CM | POA: Diagnosis not present

## 2016-06-21 DIAGNOSIS — F1020 Alcohol dependence, uncomplicated: Secondary | ICD-10-CM

## 2016-06-21 DIAGNOSIS — M545 Low back pain, unspecified: Secondary | ICD-10-CM

## 2016-06-21 DIAGNOSIS — D696 Thrombocytopenia, unspecified: Secondary | ICD-10-CM

## 2016-06-21 DIAGNOSIS — K767 Hepatorenal syndrome: Secondary | ICD-10-CM

## 2016-06-21 DIAGNOSIS — K7290 Hepatic failure, unspecified without coma: Secondary | ICD-10-CM

## 2016-06-21 DIAGNOSIS — K766 Portal hypertension: Secondary | ICD-10-CM | POA: Diagnosis not present

## 2016-06-21 DIAGNOSIS — F330 Major depressive disorder, recurrent, mild: Secondary | ICD-10-CM | POA: Diagnosis not present

## 2016-06-21 DIAGNOSIS — I480 Paroxysmal atrial fibrillation: Secondary | ICD-10-CM

## 2016-06-21 DIAGNOSIS — K7682 Hepatic encephalopathy: Secondary | ICD-10-CM

## 2016-06-21 DIAGNOSIS — G8929 Other chronic pain: Secondary | ICD-10-CM | POA: Diagnosis not present

## 2016-06-21 DIAGNOSIS — I2510 Atherosclerotic heart disease of native coronary artery without angina pectoris: Secondary | ICD-10-CM

## 2016-06-21 NOTE — Progress Notes (Signed)
Location:  Occupational psychologist of Service:  Clinic (12)  Provider: Thorvald Orsino L. Mariea Clonts, D.O., C.M.D.  Code Status: DNR Goals of Care:  Advanced Directives 06/21/2016  Does patient have an advance directive? Yes  Type of Advance Directive Out of facility DNR (pink MOST or yellow form);[place];Living will  Does patient want to make changes to advanced directive? -  Copy of advanced directive(s) in chart? Yes  Would patient like information on creating an advanced directive? -  Pre-existing out of facility DNR order (yellow form or pink MOST form) Yellow form placed in chart (order not valid for inpatient use)     Chief Complaint  Patient presents with  . Medical Management of Chronic Issues    6 week follow-up    HPI: Patient is a 65 y.o. male seen today for medical management of chronic diseases.    Reports still having scrotal swelling, but abdominal ascites much better.  He remains on aldactone 129m daily, xifaxan 555mbid, thiamine, nadolol (afib also), lasix 4009OBaily, folic acid, and cipro SBE prophylaxis all for his alcoholic cirrhosis.  He is doing much better in terms of mentation and functioning.  No falling.    Discussed counseling with DoButch Pennyossibly to help.  Suffers from chronic pain and depression comes and goes pretty strongly.  He has not had a drink since his hospital admission in the summer.  He has plans to go out of the facility for Thanksgiving.    Had his lactulose and needs to use the restroom suddenly during he appt.   Thinks it's gas but doesn't want to risk it.    He is planning to move to a regular AL apt from AL enhanced.  He's been visiting his office a couple of days per week and would really like to start having regular hrs 8-8.  He cannot stay up late due to fatigue now.   He did screen positive for mild depression despite wellbutrin therapy.  Chronic pain is not helping this.    [facility]  06/21/2016 08/25/2014  Decreased Interest 1 0  Down, Depressed, Hopeless 1 0  PHQ - 2 Score 2 0  Altered sleeping 0 -  Tired, decreased energy 1 -  Change in appetite 0 -  Feeling bad or failure about yourself  0 -  Trouble concentrating 0 -  Moving slowly or fidgety/restless 0 -  Suicidal thoughts 0 -  PHQ-9 Score 3 -  Difficult doing work/chores Not difficult at all -  Some recent data might be hidden    Past Medical History:  Diagnosis Date  . Acute alcoholic hepatitis   . Alcohol abuse with physiological dependence ([facility]CS[facility]7/04/2012  . Alcoholic hepatitis with ascites 11/13/2014  . Anxiety state, unspecified   . Atrial fibrillation ([facility]  . Bacteremia, escherichia coli   . Bisphosphonate-associated osteonecrosis of the jaw (HCWhite9/22/2014   Localized area base of tooth #18 noted 8/14 by oral surgeon  . Cirrhosis, alcoholic ([facility]  . Coronary artery disease    s/p PCI to the LAD in 2011 with a BMS  . Depression   . Dysrhythmia   . Elevated prostate specific antigen (PSA)   . Essential and other specified forms of tremor   . Family history of malignant neoplasm of gastrointestinal tract   . Hepatorenal syndrome (HCSchubert7/04/2016  . Hypocalcemia   . Malignant neoplasm of prostate (HCCando4/2009   Mets to Bone  .  Other and unspecified hyperlipidemia   . Personal history of colonic polyps   . Pneumonia    childhood  . Symptomatic anemia 02/13/2016  . Unspecified essential hypertension     Past Surgical History:  Procedure Laterality Date  . CARDIAC CATHETERIZATION    . CARDIOVERSION N/A 08/18/2015   Procedure: CARDIOVERSION;  Surgeon: Thayer Headings, MD;  Location: [facility] ENDOSCOPY;  Service: Cardiovascular;  Laterality: N/A;  . CHOLECYSTECTOMY    . COLONOSCOPY W/ BIOPSIES    . CORONARY STENT PLACEMENT  2011  . TEE WITHOUT CARDIOVERSION N/A 08/18/2015   Procedure: TRANSESOPHAGEAL ECHOCARDIOGRAM (TEE);  Surgeon: Thayer Headings, MD;  Location: [facility];  Service:  Cardiovascular;  Laterality: N/A;  . VASECTOMY      No Known Allergies    Medication List       Accurate as of 06/21/16  1:48 PM. Always use your most recent med list.          buPROPion 200 MG 12 hr tablet Commonly known as:  WELLBUTRIN SR Take 200 mg by mouth daily.   CALCIUM + D3 PO Take 1 tablet by mouth 2 (two) times daily.   ciprofloxacin 500 MG tablet Commonly known as:  CIPRO Take 500 mg by mouth daily.   [employer] ASPIRIN LOW DOSE 81 MG EC tablet Generic drug:  aspirin Take 81 mg by mouth daily.   [employer]S BALANCED B-100 PO Take 1 tablet by mouth daily.   feeding supplement (ENSURE) Pudg Take 1 Container by mouth 2 (two) times daily between meals.   folic acid 1 MG tablet Commonly known as:  FOLVITE Take 1 mg by mouth daily.   furosemide 40 MG tablet Commonly known as:  LASIX Take 1 tablet (40 mg total) by mouth daily.   ipratropium-albuterol 0.5-2.5 (3) MG/3ML Soln Commonly known as:  DUONEB Take 3 mLs by nebulization every 4 (four) hours as needed.   lactulose 10 GM/15ML solution Commonly known as:  CHRONULAC Take 45 mLs (30 g total) by mouth 2 (two) times daily.   LORazepam 0.5 MG tablet Commonly known as:  ATIVAN Take 0.5 mg by mouth once a week.   multivitamin with minerals Tabs tablet Take 1 tablet by mouth daily with breakfast.   nadolol 20 MG tablet Commonly known as:  CORGARD Take 1 tablet (20 mg total) by mouth daily.   oxyCODONE 5 MG immediate release tablet Commonly known as:  Oxy IR/ROXICODONE Take 5 mg by mouth every 8 (eight) hours as needed. For pain scale 1-5 and take 2 tablets by mouth every 8 hours as needed for pain scale 6-10   Oxycodone HCl 10 MG Tabs Take 1 tablet (10 mg total) by mouth at bedtime.   rifaximin 550 MG Tabs tablet Commonly known as:  XIFAXAN Take 1 tablet (550 mg total) by mouth 2 (two) times daily.   spironolactone 100 MG tablet Commonly known as:  ALDACTONE Take 100 mg by mouth daily.   thiamine 100 MG  tablet Take 100 mg by mouth daily.   VANTAS 50 MG Kit Generic drug:  Histrelin Acetate   zinc oxide 20 % ointment Apply 1 application topically as needed for irritation.       Review of Systems:  Review of Systems  Constitutional: Positive for weight loss. Negative for chills, fever and malaise/fatigue.  HENT: Negative for congestion and hearing loss.   Eyes: Negative for blurred vision.  Respiratory: Negative for shortness of breath.   Cardiovascular: Negative for chest pain, palpitations and  leg swelling.  Gastrointestinal: Positive for abdominal pain and diarrhea. Negative for blood in stool, constipation, heartburn, melena, nausea and vomiting.  Genitourinary: Positive for frequency. Negative for dysuria.  Musculoskeletal: Positive for back pain. Negative for falls.  Skin: Negative for itching and rash.       jaundice  Neurological: Positive for weakness. Negative for dizziness and loss of consciousness.  Endo/Heme/Allergies: Bruises/bleeds easily.  Psychiatric/Behavioral: Positive for depression. Negative for memory loss and substance abuse.       Reports he's had suicidal thoughts but would never follow through on them    Health Maintenance  Topic Date Due  . TETANUS/TDAP  11/08/1967  . ZOSTAVAX  11/07/2008  . PNA vac Low Risk Adult (1 of 2 - PCV13) 11/07/2013  . INFLUENZA VACCINE  03/07/2016  . COLONOSCOPY  10/29/2017  . Hepatitis C Screening  Completed    Physical Exam: Vitals:   06/21/16 1336  BP: 118/70  Pulse: 83  Temp: 98.1 F (36.7 C)  TempSrc: Oral  SpO2: 97%  Weight: 174 lb (78.9 kg)   Body mass index is 22.34 kg/m. Physical Exam  Constitutional: He is oriented to person, place, and time. He appears well-nourished. No distress.  HENT:  Head: Normocephalic and atraumatic.  Cardiovascular: Normal rate, regular rhythm, normal heart sounds and intact distal pulses.   Pulmonary/Chest: Effort normal and breath sounds normal. No respiratory distress.  He has no rales.  Abdominal: Soft. Bowel sounds are normal. He exhibits distension. He exhibits no mass. There is no tenderness. There is no rebound and no guarding. No hernia.  Only mild ascites at present; has scrotal swelling  Neurological: He is alert and oriented to person, place, and time.  Skin: Skin is warm and dry. Capillary refill takes less than 2 seconds.  Psychiatric: His behavior is normal. Judgment and thought content normal.  Teared up a little when talking about his depression    Labs reviewed: Basic Metabolic Panel:  Recent Labs  08/16/15 1804  02/13/16 1415  03/28/16 1642 04/04/16 1030 04/06/16 1057 04/15/16 1639 05/10/16 0300  NA 134*  < >  --   < > 125* 129* 129* 134* 133*  K 3.5  < >  --   < > 4.9 4.7 5.2* 4.4 3.7  CL 99*  < >  --   < > 94* 96*  --  97*  --   CO2 22  < >  --   < > _0 --   --   GLUCOSE 110*  < >  --   < > 104* 73 81 83  --   BUN 9  < >  --   < > 32* 35* 32.4* 20 10  CREATININE 1.07  < >  --   < > 1.40* 1.37* 1.4* 1.20 1.0  CALCIUM 8.8*  < >  --   < > 7.8* 8.1* 8.2*  --   --   MG 2.1  --  2.5*  --   --   --   --   --   --   PHOS  --   --  4.7*  --   --   --   --   --   --   TSH 3.376  --   --   --   --   --   --   --   --   < > = values in this interval not displayed. Liver Function Tests:  Recent Labs  03/28/16 1642 04/04/16 1030 04/06/16 1057 05/10/16 0300  AST 49* 44* 44* 30  ALT 54 42 43 19  ALKPHOS 157* 142* 172* 109  BILITOT 4.4* 3.9* 4.17*  --   PROT 5.2* 5.2* 5.7*  --   ALBUMIN 2.4* 2.4* 2.4*  --    No results for input(s): LIPASE, AMYLASE in the last 8760 hours.  Recent Labs  02/13/16 0936 02/14/16 0528 04/04/16 1500  AMMONIA 19 61* 65*   CBC:  Recent Labs  03/28/16 1642 04/04/16 1030 04/06/16 1057 04/15/16 1639  WBC 14.2* 12.7* 11.9*  --   NEUTROABS 12.0* 9.6* 9.9*  --   HGB 9.5* 9.3* 9.5* 8.8*  HCT 26.8* 26.6* 27.3* 26.0*  MCV 117.3* 118.2* 113.8*  --   PLT 158 107* 111*  --    Lipid  Panel: No results for input(s): CHOL, HDL, LDLCALC, TRIG, CHOLHDL, LDLDIRECT in the last 8760 hours. Lab Results  Component Value Date   HGBA1C 4.6 (L) 12/21/2014    Procedures since last visit: US Abdomen Limited  Result Date: 06/12/2016 CLINICAL DATA:  Ascites. EXAM: US ABDOMEN LIMITED - ASCITES COMPARISON:  Ultrasound 05/26/2016. FINDINGS: Very mild ascites.  No focal abnormality otherwise identified . IMPRESSION: Very mild ascites. Electronically Signed   By: Marcello Moores  Register   On: 06/12/2016 12:00   US Paracentesis  Result Date: 05/26/2016 INDICATION: Cirrhosis, recurrent ascites. Request made for therapeutic paracentesis up to 4 liters. EXAM: ULTRASOUND GUIDED THERAPEUTIC  PARACENTESIS MEDICATIONS: None. COMPLICATIONS: None immediate. PROCEDURE: Informed written consent was obtained from the patient after a discussion of the risks, benefits and alternatives to treatment. A timeout was performed prior to the initiation of the procedure. Initial ultrasound scanning demonstrates a moderate amount of ascites within the right lower abdominal quadrant. The right lower abdomen was prepped and draped in the usual sterile fashion. 1% lidocaine was used for local anesthesia. Following this, a Yueh catheter was introduced. An ultrasound image was saved for documentation purposes. The paracentesis was performed. The catheter was removed and a dressing was applied. The patient tolerated the procedure well without immediate post procedural complication. FINDINGS: A total of approximately 3.4 liters of slightly hazy, yellow fluid was removed. IMPRESSION: Successful ultrasound-guided therapeutic paracentesis yielding 3.4 liters of peritoneal fluid. The patient will receive IV albumin infusion postprocedure. Read by: Rowe Willet, PA-C Electronically Signed   By: Aletta Edouard M.D.   On: 05/26/2016 13:11    Assessment/Plan 1. Alcoholic cirrhosis of liver with ascites ([place]) -seems he has stabilized at this  point -getting regular paracenteses and on a medical regimen that seems to be effective for him -cont as listed in hpi  2. Hepatorenal syndrome ([place]) -being followed by GI  -overall has improved  3. Paroxysmal atrial fibrillation (HCC) -cont nadolol, rate controlled  4. Portal hypertension (HCC) -ongoing, cont beta blocker   5. Encephalopathy, hepatic ([place]) -mentation much better now, continues lactulose which frustrates him with urgent trips to the restroom, but it's working well  6. Thrombocytopenia ([place]) -ongoing, due to cirrhosis, has typical petechial bruising  7. Alcohol abuse with physiological dependence ([place]) -emphasized to him today the importance of avoiding all alcohol when out for thanksgiving and out at his office--he expressed understanding  8. Chronic right-sided low back pain without sciatica -chronic, ongoing pain especially when he tries to turn over at night--continues on the oxycodone though worries about dependence--seems to require it  9.  Mild recurrent major depression -he feels it is not helped by wellbutrin 233m daily -  he requests some counseling and a change in his meds -d/c wellbutrin and try him on zoloft 64m daily, then increase to 520mdaily until I see him again in 6 wks -explained the difference b/w the two meds to him and he understood -we also can make adjustments between if needed, but the new med can take 4--6 wks to work  Labs/tests ordered:  No new[facility]  [facility]/2016 f/u depression  Anayeli Arel L. Natalin Bible, D.O. [facility] [address] [address] Phone (Mon-Fri 8am-5pm):  [phone] Call:  [phone] follow prompts after 5pm & weekends Office Phone:  [phone] Fax:  [phone]

## 2016-06-26 ENCOUNTER — Other Ambulatory Visit: Payer: Self-pay | Admitting: Internal Medicine

## 2016-06-26 ENCOUNTER — Ambulatory Visit (HOSPITAL_COMMUNITY)
Admission: RE | Admit: 2016-06-26 | Discharge: 2016-06-26 | Disposition: A | Discharge status: Home / Self Care | Payer: Medicare Other | Source: Ambulatory Visit | Attending: Internal Medicine | Admitting: Internal Medicine

## 2016-06-26 DIAGNOSIS — R188 Other ascites: Secondary | ICD-10-CM | POA: Insufficient documentation

## 2016-06-26 NOTE — Progress Notes (Signed)
Patient ID: Allen Schroeder, male   DOB: [birth year], 65 y.o.   MRN: [id number]  Patient with history of ascites presents today for scheduled paracentesis.  Limited US performed; shows minimal fluid in the left lower abdomen with no safe access.  No paracentesis today.  Patient to reschedule as needed.   Brynda Greathouse, MS RD PA-C [facility]

## 2016-06-28 ENCOUNTER — Encounter: Payer: Self-pay | Admitting: Internal Medicine

## 2016-07-10 ENCOUNTER — Ambulatory Visit (HOSPITAL_COMMUNITY)
Admission: RE | Admit: 2016-07-10 | Discharge: 2016-07-10 | Disposition: A | Discharge status: Home / Self Care | Payer: Medicare Other | Source: Ambulatory Visit | Attending: Internal Medicine | Admitting: Internal Medicine

## 2016-07-10 ENCOUNTER — Other Ambulatory Visit: Payer: Self-pay | Admitting: Internal Medicine

## 2016-07-10 ENCOUNTER — Telehealth: Payer: Self-pay

## 2016-07-10 DIAGNOSIS — R188 Other ascites: Secondary | ICD-10-CM

## 2016-07-10 NOTE — Progress Notes (Signed)
Patient ID: Allen Schroeder, male   DOB: 08-13-1950, 65 y.o.   MRN: [id number]  Limited Abd US performed ---all 4 quadrants  Very small amount ascites noted LLQ- No other ascites appreciated.  NO paracentesis performed today  DC to home

## 2016-07-10 NOTE — Telephone Encounter (Signed)
Per Dr Carlean Purl ok to do his weight and abdominal circumference once weekly instead of 3 times a week.  I faxed the signed order for this back to [phone] and also called and spoke to the lady that works with him. Phone # [phone].

## 2016-07-14 ENCOUNTER — Telehealth: Payer: Self-pay | Admitting: *Deleted

## 2016-07-14 DIAGNOSIS — R531 Weakness: Secondary | ICD-10-CM | POA: Diagnosis not present

## 2016-07-14 NOTE — Telephone Encounter (Signed)
Archie Endo, NP with [facility] called and stated that patient is ready for Hospice stated that he is on the same page of comfort care only, no hospitalizations. Spouse is having a difficult time with it. NP needs orders to start [place] and will you be attending. Please Advise.

## 2016-07-15 NOTE — Telephone Encounter (Signed)
I agree with the the hospice referral and I will be attending.  He and I had spoken about this at his appointment--he brought it up.

## 2016-07-17 ENCOUNTER — Telehealth: Payer: Self-pay | Admitting: Internal Medicine

## 2016-07-17 NOTE — Telephone Encounter (Signed)
Allen Schroeder Notified.

## 2016-07-17 NOTE — Telephone Encounter (Signed)
Allen Schroeder notified per Dr. Carlean Purl ok to cancel routine paracentesis and change order to PRN

## 2016-07-18 ENCOUNTER — Other Ambulatory Visit: Payer: Medicare Other

## 2016-07-20 ENCOUNTER — Non-Acute Institutional Stay: Payer: Medicare Other | Admitting: Adult Health

## 2016-07-20 DIAGNOSIS — K7290 Hepatic failure, unspecified without coma: Secondary | ICD-10-CM | POA: Diagnosis not present

## 2016-07-20 DIAGNOSIS — R05 Cough: Secondary | ICD-10-CM | POA: Diagnosis not present

## 2016-07-20 DIAGNOSIS — K7682 Hepatic encephalopathy: Secondary | ICD-10-CM

## 2016-07-20 DIAGNOSIS — Z7189 Other specified counseling: Secondary | ICD-10-CM

## 2016-07-20 DIAGNOSIS — R059 Cough, unspecified: Secondary | ICD-10-CM

## 2016-07-20 NOTE — Progress Notes (Signed)
Location:  Occupational psychologist of Service:  ALF (13) Provider:   Cindi Carbon, ANP [place] [phone]   REED, Jonelle Sidle, DO  Patient Care Team: Gayland Curry, DO as PCP - General (Geriatric Medicine) Annia Belt, MD as Consulting Physician (Oncology) Carolan Clines, MD as Consulting Physician (Urology)  Extended Emergency Contact Information Primary Emergency Contact: Biehn,Ellen Address: [address]          [place], [place] [address] Johnnette Litter of [place] Phone: [phone] Mobile Phone: [phone] Relation: Spouse Secondary Emergency Contact: [place] of [place] Mobile Phone: [phone] Relation: Son  Code Status:  DNR Goals of care: Advanced Directive information Advanced Directives 07/26/2016  Does Patient Have a Medical Advance Directive? Yes  Type of Paramedic of [place];Living will;Out of facility DNR (pink MOST or yellow form)  Does patient want to make changes to medical advance directive? -  Copy of [place] in Chart? Yes  Would patient like information on creating a medical advance directive? -  Pre-existing out of facility DNR order (yellow form or pink MOST form) Yellow form placed in chart (order not valid for inpatient use);Pink MOST form placed in chart (order not valid for inpatient use)     Chief Complaint  Patient presents with  . Acute Visit    cough, increased confusion    HPI:  Pt is a 65 y.o. male seen today for an acute visit for cough present for two weeks with wheezing.  He has normal 02 sats with no fever. Eating and drinking fairly well. Ambulating to the BR without help.  No SOB or abd pain He has a hx of hepatic encephalopathy and is on lactulose two times a day.  His caregiver reports he has two BM's per day that are formed.  Staff at [facility] feels he is more confused than normal. He is a DNR and currently  followed by hospice but is requesting information regarding Hospice. He previously went for scheduled paracenteses but has declined to do this any longer.  Most recent abd ultra sounds have revealed too little fluid to drain. He weight has remained stable.  No change in abd girth.     Past Medical History:  Diagnosis Date  . Acute alcoholic hepatitis   . Alcohol abuse with physiological dependence ([place]) 02/13/2012  . Alcoholic hepatitis with ascites 11/13/2014  . Anxiety state, unspecified   . Atrial fibrillation ([place])   . Bacteremia, escherichia coli   . Bisphosphonate-associated osteonecrosis of the jaw ([place]) 04/28/2013   Localized area base of tooth #18 noted 8/14 by oral surgeon  . Cirrhosis, alcoholic ([place])   . Coronary artery disease    s/p PCI to the LAD in 2011 with a BMS  . Depression   . Dysrhythmia   . Elevated prostate specific antigen (PSA)   . Essential and other specified forms of tremor   . Family history of malignant neoplasm of gastrointestinal tract   . Hepatorenal syndrome ([place]) 02/13/2016  . Hypocalcemia   . Malignant neoplasm of prostate ([place]) 11/2007   Mets to Bone  . Other and unspecified hyperlipidemia   . Personal history of colonic polyps   . Pneumonia    childhood  . Symptomatic anemia 02/13/2016  . Unspecified essential hypertension    Past Surgical History:  Procedure Laterality Date  . CARDIAC CATHETERIZATION    . CARDIOVERSION N/A 08/18/2015   Procedure: CARDIOVERSION;  Surgeon: Wonda Cheng  Nahser, MD;  Location: [facility];  Service: Cardiovascular;  Laterality: N/A;  . CHOLECYSTECTOMY    . COLONOSCOPY W/ BIOPSIES    . CORONARY STENT PLACEMENT  2011  . TEE WITHOUT CARDIOVERSION N/A 08/18/2015   Procedure: TRANSESOPHAGEAL ECHOCARDIOGRAM (TEE);  Surgeon: Thayer Headings, MD;  Location: [facility];  Service: Cardiovascular;  Laterality: N/A;  . VASECTOMY      No Known Allergies  Allergies as of 07/20/2016   No Known Allergies     Medication List         Accurate as of 07/20/16 11:59 PM. Always use your most recent med list.          CALCIUM + D3 PO Take 1 tablet by mouth 2 (two) times daily.   ciprofloxacin 500 MG tablet Commonly known as:  CIPRO Take 500 mg by mouth daily.   CVS ASPIRIN LOW DOSE 81 MG EC tablet Generic drug:  aspirin Take 81 mg by mouth daily.   CVS BALANCED B-100 PO Take 1 tablet by mouth daily.   feeding supplement (ENSURE) Pudg Take 1 Container by mouth 2 (two) times daily between meals.   folic acid 1 MG tablet Commonly known as:  FOLVITE Take 1 mg by mouth daily.   furosemide 40 MG tablet Commonly known as:  LASIX Take 1 tablet (40 mg total) by mouth daily.   ipratropium-albuterol 0.5-2.5 (3) MG/3ML Soln Commonly known as:  DUONEB Take 3 mLs by nebulization every 4 (four) hours as needed.   lactulose 10 GM/15ML solution Commonly known as:  CHRONULAC Take 45 mLs (30 g total) by mouth 2 (two) times daily.   LORazepam 0.5 MG tablet Commonly known as:  ATIVAN Take 0.5 mg by mouth once a week.   multivitamin with minerals Tabs tablet Take 1 tablet by mouth daily with breakfast.   nadolol 20 MG tablet Commonly known as:  CORGARD Take 1 tablet (20 mg total) by mouth daily.   oxyCODONE 5 MG immediate release tablet Commonly known as:  Oxy IR/ROXICODONE Take 5 mg by mouth every 8 (eight) hours as needed. For pain scale 1-5 and take 2 tablets by mouth every 8 hours as needed for pain scale 6-10   Oxycodone HCl 10 MG Tabs Take 1 tablet (10 mg total) by mouth at bedtime.   rifaximin 550 MG Tabs tablet Commonly known as:  XIFAXAN Take 1 tablet (550 mg total) by mouth 2 (two) times daily.   sertraline 50 MG tablet Commonly known as:  ZOLOFT Take 50 mg by mouth daily.   spironolactone 100 MG tablet Commonly known as:  ALDACTONE Take 100 mg by mouth daily.   thiamine 100 MG tablet Take 100 mg by mouth daily.   VANTAS 50 MG Kit Generic drug:  Histrelin Acetate   zinc oxide 20 %  ointment Apply 1 application topically as needed for irritation.       Review of Systems  Constitutional: Negative for activity change, appetite change, chills, diaphoresis, fatigue, fever and unexpected weight change.  HENT: Positive for congestion. Negative for sinus pressure, sore throat, trouble swallowing and voice change.   Respiratory: Positive for cough and wheezing. Negative for shortness of breath and stridor.   Cardiovascular: Positive for leg swelling. Negative for chest pain and palpitations.  Gastrointestinal: Positive for abdominal distention. Negative for abdominal pain, constipation, diarrhea, nausea and vomiting.  Genitourinary: Negative for difficulty urinating and dysuria.  Musculoskeletal: Positive for gait problem. Negative for arthralgias, back pain, joint swelling and myalgias.  Neurological: Negative for dizziness, seizures, syncope, facial asymmetry, speech difficulty, weakness and headaches.  Hematological: Negative for adenopathy. Does not bruise/bleed easily.  Psychiatric/Behavioral: Positive for confusion. Negative for agitation and behavioral problems.    Immunization History  Administered Date(s) Administered  . Influenza,inj,Quad PF,36+ Mos 04/28/2013   Pertinent  Health Maintenance Due  Topic Date Due  . PNA vac Low Risk Adult (1 of 2 - PCV13) 11/07/2013  . INFLUENZA VACCINE  03/07/2016  . COLONOSCOPY  10/29/2017   Fall Risk  06/21/2016 08/25/2014  Falls in the past year? No No   Functional Status Survey:    Vitals:   07/20/16 1421  BP: 95/64  Pulse: 61  Resp: 20  Temp: 97.6 F (36.4 C)  Weight: 186 lb 3.2 oz (84.5 kg)   Body mass index is 23.91 kg/m. Physical Exam  Constitutional: He is oriented to person, place, and time. No distress.  HENT:  Head: Normocephalic and atraumatic.  Right Ear: External ear normal.  Left Ear: External ear normal.  Mouth/Throat: Oropharynx is clear and moist. No oropharyngeal exudate.  Yellow drainage  and erythema to both nares  Eyes: Conjunctivae and EOM are normal. Pupils are equal, round, and reactive to light. Right eye exhibits no discharge. Left eye exhibits no discharge.  Neck: No JVD present. No tracheal deviation present. No thyromegaly present.  Cardiovascular: Normal rate and regular rhythm.   No murmur heard. BLE edema +2  Pulmonary/Chest: Effort normal. He has wheezes. He has no rales. He exhibits no tenderness.  bilat rhonchi  Abdominal: Soft. Bowel sounds are normal. He exhibits distension (soft, non tender.).  Large abd  Lymphadenopathy:    He has cervical adenopathy.  Neurological: He is alert and oriented to person, place, and time. No cranial nerve deficit.  Skin: Skin is warm and dry. He is not diaphoretic.  Psychiatric: He has a normal mood and affect.    Labs reviewed:  Recent Labs  08/16/15 1804  02/13/16 1415  03/28/16 1642 04/04/16 1030 04/06/16 1057 04/15/16 1639 05/10/16 0300  NA 134*  < >  --   < > 125* 129* 129* 134* 133*  K 3.5  < >  --   < > 4.9 4.7 5.2* 4.4 3.7  CL 99*  < >  --   < > 94* 96*  --  97*  --   CO2 22  < >  --   < > 24 24 24   --   --   GLUCOSE 110*  < >  --   < > 104* 73 81 83  --   BUN 9  < >  --   < > 32* 35* 32.4* 20 10  CREATININE 1.07  < >  --   < > 1.40* 1.37* 1.4* 1.20 1.0  CALCIUM 8.8*  < >  --   < > 7.8* 8.1* 8.2*  --   --   MG 2.1  --  2.5*  --   --   --   --   --   --   PHOS  --   --  4.7*  --   --   --   --   --   --   < > = values in this interval not displayed.  Recent Labs  03/28/16 1642 04/04/16 1030 04/06/16 1057 05/10/16 0300  AST 49* 44* 44* 30  ALT 54 42 43 19  ALKPHOS 157* 142* 172* 109  BILITOT 4.4* 3.9* 4.17*  --  PROT 5.2* 5.2* 5.7*  --   ALBUMIN 2.4* 2.4* 2.4*  --     Recent Labs  03/28/16 1642 04/04/16 1030 04/06/16 1057 04/15/16 1639  WBC 14.2* 12.7* 11.9*  --   NEUTROABS 12.0* 9.6* 9.9*  --   HGB 9.5* 9.3* 9.5* 8.8*  HCT 26.8* 26.6* 27.3* 26.0*  MCV 117.3* 118.2* 113.8*  --     PLT 158 107* 111*  --    Lab Results  Component Value Date   TSH 3.376 08/16/2015   Lab Results  Component Value Date   HGBA1C 4.6 (L) 12/21/2014   Lab Results  Component Value Date   CHOL 156 11/04/2012   HDL 54.60 11/04/2012   LDLCALC 61 11/04/2012   LDLDIRECT 95.6 05/11/2010   TRIG 200.0 (H) 11/04/2012   CHOLHDL 3 11/04/2012    Significant Diagnostic Results in last 30 days:  US Abdomen Limited  Result Date: 07/10/2016 CLINICAL DATA:  Abdominal ultrasound to assess for ascites for possible paracentesis. EXAM: LIMITED ABDOMEN ULTRASOUND FOR ASCITES TECHNIQUE: Limited ultrasound survey for ascites was performed in all four abdominal quadrants. COMPARISON:  06/26/2016 FINDINGS: There is only a small amount ascites, primarily adjacent to the liver and spleen. This was not felt sufficient to allow for safe paracentesis. IMPRESSION: 1. Insufficient amount of ascites for safe paracentesis. Electronically Signed   By: Lajean Manes M.D.   On: 07/10/2016 12:48    Assessment/Plan  1. Cough Present for two weeks with wheezing, CXR neg Most likely bronchitis Duoneb tid x 5 days Doxycycline 100 mg BID for  d5ays Florastor 1 cap BID for 5 days  2. Encephalopathy, hepatic (HCC) NH4 and CBC pending If elevated with increase lactulose as this may be there reason for his increased confusion No signs of increasing abd size, weight unchanged  3. Advanced care planning I had a long discussion with Mr. Beneke regarding end of life issues. He has had some difficulty making choices.  Today he is slightly more confused but still able to communicate and carry a conversation. At this point he is saying that he does not want to go for scheduled paracentesis but if he showed signs of distress from fluid retention he would want to be treated on an as needed basis.  He reports growing tired of going for abd ultrasound but not having enough fluid to drain.  He would rather avoid hospitalizations or  unnecessary diagnostic testing.  He was not sure if he wanted treatment for infections. Since he is confused and but fairly stable I decided to go ahead and treat him for bronchitis but we can continue to have this conversation with him to further establish his plan of care. He remains a DNR. He will continue to consider Hospice.   Family/ staff Communication: discussed with resident  Labs/tests ordered:  CXR CBC NH4

## 2016-07-21 DIAGNOSIS — K709 Alcoholic liver disease, unspecified: Secondary | ICD-10-CM | POA: Diagnosis not present

## 2016-07-21 DIAGNOSIS — K7031 Alcoholic cirrhosis of liver with ascites: Secondary | ICD-10-CM | POA: Diagnosis not present

## 2016-07-21 DIAGNOSIS — M6281 Muscle weakness (generalized): Secondary | ICD-10-CM | POA: Diagnosis not present

## 2016-07-21 DIAGNOSIS — N4231 Prostatic intraepithelial neoplasia: Secondary | ICD-10-CM | POA: Diagnosis not present

## 2016-07-21 DIAGNOSIS — R350 Frequency of micturition: Secondary | ICD-10-CM | POA: Diagnosis not present

## 2016-07-21 DIAGNOSIS — D6959 Other secondary thrombocytopenia: Secondary | ICD-10-CM | POA: Diagnosis not present

## 2016-07-21 DIAGNOSIS — R188 Other ascites: Secondary | ICD-10-CM | POA: Diagnosis not present

## 2016-07-21 DIAGNOSIS — C61 Malignant neoplasm of prostate: Secondary | ICD-10-CM | POA: Diagnosis not present

## 2016-07-21 LAB — BASIC METABOLIC PANEL
BUN: 18 mg/dL (ref 4–21)
Creatinine: 0.9 mg/dL (ref 0.6–1.3)
Glucose: 85 mg/dL
Potassium: 4.6 mmol/L (ref 3.4–5.3)
Sodium: 132 mmol/L — AB (ref 137–147)

## 2016-07-21 LAB — CBC AND DIFFERENTIAL
HCT: 26 % — AB (ref 41–53)
Hemoglobin: 8.8 g/dL — AB (ref 13.5–17.5)
Platelets: 93 10*3/uL — AB (ref 150–399)
WBC: 7 10^3/mL

## 2016-07-21 LAB — HEPATIC FUNCTION PANEL
ALT: 19 U/L (ref 10–40)
AST: 40 U/L (ref 14–40)
Alkaline Phosphatase: 122 U/L (ref 25–125)
Bilirubin, Total: 2.1 mg/dL

## 2016-07-21 LAB — PSA: PSA: 0.12

## 2016-07-24 ENCOUNTER — Ambulatory Visit (HOSPITAL_COMMUNITY): Payer: Medicare Other

## 2016-07-24 DIAGNOSIS — K7031 Alcoholic cirrhosis of liver with ascites: Secondary | ICD-10-CM | POA: Diagnosis not present

## 2016-07-25 ENCOUNTER — Ambulatory Visit (HOSPITAL_BASED_OUTPATIENT_CLINIC_OR_DEPARTMENT_OTHER): Payer: Medicare Other | Admitting: Oncology

## 2016-07-25 ENCOUNTER — Telehealth: Payer: Self-pay | Admitting: Oncology

## 2016-07-25 DIAGNOSIS — R17 Unspecified jaundice: Secondary | ICD-10-CM

## 2016-07-25 DIAGNOSIS — C61 Malignant neoplasm of prostate: Secondary | ICD-10-CM

## 2016-07-25 DIAGNOSIS — C7951 Secondary malignant neoplasm of bone: Secondary | ICD-10-CM

## 2016-07-25 NOTE — Telephone Encounter (Signed)
Lab and follow up appointments scheduled for 10/24/16, per 07/25/16 los. A copy of the appointment schedule and AVS report was given to the patient, per 07/25/16 los.

## 2016-07-25 NOTE — Progress Notes (Signed)
Hematology and Oncology Follow Up Visit  Allen Schroeder [id number] [birth year] 65 y.o. 07/25/2016 1:49 PM REED, TIFFANY, DOReed, Tiffany L, DO   Principle Diagnosis: 66 year old gentleman with castration resistant prostate cancer with metastatic disease to the bone. He was initially diagnosed in October of 2009 with bony metastasis.   Prior Therapy: He was treated with hormonal therapy with monthly Fermagon injections and monthly Zometa infusions. PSA fell from 72.9 in October 2009 to a nadir of 0.24 by June of 2012.  PSA began to rise again and was 4.09 on 02/06/2012. Bone scan at that point did not show any obvious new lesions. He was started on Xtandi and once again has had a very nice response since.   Current therapy: Xtandi 160 mg daily started September of 2013 with an excellent response PSA is close to 0. Has been on hold since 11/2014 due to liver disease.  This was resumed on 07/08/2015. Therapy was withheld in July 2017 because of acute decompensation of his liver disease.  Interim History:  [name] presents today for a followup visit. Since the last visit, he reports no major changes in his health. He continues to reside in an assisted living facility because of his overall health. He has been off Xtandi have not had any issues related to his prostate cancer. denied any bone pain or pathological fractures. He does not report any urological symptoms. He continues to have ascites and periodic paracentesis as needed. He is on diuretics but does report scrotal edema as well as lower extremity edema. His mobility is limited and his performance status remains reasonably poor.  He reports no headaches, blurry vision or other neurological symptoms. He denied any alteration of mental status or syncope. He does not report any fevers or chills or sweats. He does not report any chest pain, palpitation. He does not report any cough or hemoptysis. He does not report any nausea, vomiting, constipation  or hematochezia. Has not reported any genitourinary complaints, petechiae or lymphadenopathy.  The rest of his review of systems unremarkable.   Medications: I have reviewed the patient's current medications.  Current Outpatient Prescriptions  Medication Sig Dispense Refill  . Calcium Carb-Cholecalciferol (CALCIUM + D3 PO) Take 1 tablet by mouth 2 (two) times daily.    . CVS ASPIRIN LOW DOSE 81 MG EC tablet Take 81 mg by mouth daily.  10  . feeding supplement, ENSURE, (ENSURE) PUDG Take 1 Container by mouth 2 (two) times daily between meals.    . folic acid (FOLVITE) 1 MG tablet Take 1 mg by mouth daily.  11  . furosemide (LASIX) 40 MG tablet Take 1 tablet (40 mg total) by mouth daily. 30 tablet 11  . Histrelin Acetate (VANTAS) 50 MG KIT     . ipratropium-albuterol (DUONEB) 0.5-2.5 (3) MG/3ML SOLN Take 3 mLs by nebulization every 4 (four) hours as needed.    . lactulose (CHRONULAC) 10 GM/15ML solution Take 45 mLs (30 g total) by mouth 2 (two) times daily. 240 mL 0  . LORazepam (ATIVAN) 0.5 MG tablet Take 0.5 mg by mouth once a week.     . Multiple Vitamin (MULTIVITAMIN WITH MINERALS) TABS tablet Take 1 tablet by mouth daily with breakfast.    . nadolol (CORGARD) 20 MG tablet Take 1 tablet (20 mg total) by mouth daily.    [name] oxyCODONE (OXY IR/ROXICODONE) 5 MG immediate release tablet Take 5 mg by mouth every 8 (eight) hours as needed. For pain scale 1-5 and take 2  tablets by mouth every 8 hours as needed for pain scale 6-10    . Oxycodone HCl 10 MG TABS Take 1 tablet (10 mg total) by mouth at bedtime. 30 tablet 0  . rifaximin (XIFAXAN) 550 MG TABS tablet Take 1 tablet (550 mg total) by mouth 2 (two) times daily. 60 tablet 0  . sertraline (ZOLOFT) 50 MG tablet Take 50 mg by mouth daily.    [name] spironolactone (ALDACTONE) 100 MG tablet Take 100 mg by mouth daily.   11  . thiamine 100 MG tablet Take 100 mg by mouth daily.    . Vitamins-Lipotropics (CVS BALANCED B-100 PO) Take 1 tablet by mouth daily.     [name] zinc oxide 20 % ointment Apply 1 application topically as needed for irritation.     No current facility-administered medications for this visit.      Allergies: No Known Allergies  Past Medical History, Surgical history, Social history, and Family History were reviewed and updated.   Physical Exam: There were no vitals taken for this visit. ECOG: 2 General appearance:  Alert, awake gentleman appeared without distress. Ill-appearing. Head: Normocephalic, without obvious abnormality no oral ulcers or lesions. Sclera icterus noted. Neck: no adenopathy no thyromegaly. Lymph nodes: Cervical, supraclavicular, and axillary nodes normal. Heart:regular rate and rhythm, S1, S2 normal, no murmur, click, rub or gallop Lung:chest clear, no wheezing, rales, normal symmetric air entry Abdomin: soft, non-tender, without masses or organomegaly. No tense ascites noted. EXT:no erythema, induration, or nodules. Trace edema noted. Neurological examination showed no deficits.   Lab Results: Lab Results  Component Value Date   WBC 11.9 (H) 04/06/2016   HGB 8.8 (L) 04/15/2016   HCT 26.0 (L) 04/15/2016   MCV 113.8 (H) 04/06/2016   PLT 111 (L) 04/06/2016     Chemistry      Component Value Date/Time   NA 133 (A) 05/10/2016 0300   NA 129 (L) 04/06/2016 1057   K 3.7 05/10/2016 0300   K 5.2 (H) 04/06/2016 1057   CL 97 (L) 04/15/2016 1639   CL 100 12/10/2012 1540   CO2 24 04/06/2016 1057   BUN 10 05/10/2016 0300   BUN 32.4 (H) 04/06/2016 1057   CREATININE 1.0 05/10/2016 0300   CREATININE 1.20 04/15/2016 1639   CREATININE 1.4 (H) 04/06/2016 1057   GLU 97 05/10/2016 0300      Component Value Date/Time   CALCIUM 8.2 (L) 04/06/2016 1057   ALKPHOS 109 05/10/2016 0300   ALKPHOS 172 (H) 04/06/2016 1057   AST 30 05/10/2016 0300   AST 44 (H) 04/06/2016 1057   ALT 19 05/10/2016 0300   ALT 43 04/06/2016 1057   BILITOT 4.17 ([place]) 04/06/2016 1057       Results for KWAN, SHELLHAMMER (MRN  [id number]) as of 04/12/2016 13:33  Ref. Range 03/21/2016 07:12 04/06/2016 10:57  PSA Latest Ref Range: 0.0 - 4.0 ng/mL 0.04 <0.1          Impression and Plan:  65 year old on with the following issues:  1. Castration resistant prostate cancer with metastatic disease to the bone. His continued on Xtandi with excellent response and a PSA of less than 0.01. I do not think his Gillermina Phy is contributing to his jaundice and his hepatic failure.   Gillermina Phy was resumed in December 2016 For 6 months and therapy was stopped in July 2017 because of acute decompensation of his liver disease.  His PSA continues to be close to 0 without any clinical signs or symptoms of disease  progression. The plan is to continue to hold off Xtandi and restarted if he has any signs and symptoms of cancer progression.   2. Jaundice related to alcoholic liver cirrhosis: His bilirubin appears to be improving at this time.  3. Androgen deprivation: He is currently on androgen depravation. I recommend this continues for the time being. His Vantas has been replaced by Dr. Gaynelle Arabian.   4. Atrial fibrillation: He is currently in normal sinus rhythm and chronically anticoagulated with Pradaxa.   5. Followup: Will be in 3 months.  Zola Button, MD 12/19/20171:49 PM

## 2016-07-26 ENCOUNTER — Encounter: Payer: Self-pay | Admitting: Adult Health

## 2016-07-27 DIAGNOSIS — K7011 Alcoholic hepatitis with ascites: Secondary | ICD-10-CM | POA: Diagnosis not present

## 2016-08-04 ENCOUNTER — Telehealth: Payer: Self-pay

## 2016-08-04 DIAGNOSIS — R531 Weakness: Secondary | ICD-10-CM | POA: Diagnosis not present

## 2016-08-04 NOTE — Telephone Encounter (Signed)
Message left on clinical intake voicemail:  Rodena Piety with palliative care called requesting to speak with Cindi Carbon to discuss observation:  1.) Rodena Piety thinks patient needs Hospice vs Palliative Care, patient qualifies for Hospice  2.) Need PRN order for lactulose   3.) Need order to increase frequency for pain medication

## 2016-08-04 NOTE — Telephone Encounter (Signed)
Left voicemail on Anita's cell phone.  Await return call.

## 2016-08-09 ENCOUNTER — Ambulatory Visit (HOSPITAL_COMMUNITY): Admission: RE | Admit: 2016-08-09 | Payer: Medicare Other | Source: Ambulatory Visit

## 2016-08-09 ENCOUNTER — Non-Acute Institutional Stay: Payer: Medicare Other | Admitting: Internal Medicine

## 2016-08-09 VITALS — BP 90/60 | HR 68 | Temp 97.2°F | Resp 22

## 2016-08-09 DIAGNOSIS — K767 Hepatorenal syndrome: Secondary | ICD-10-CM

## 2016-08-09 DIAGNOSIS — A084 Viral intestinal infection, unspecified: Secondary | ICD-10-CM

## 2016-08-09 DIAGNOSIS — F1020 Alcohol dependence, uncomplicated: Secondary | ICD-10-CM | POA: Diagnosis not present

## 2016-08-09 DIAGNOSIS — K7682 Hepatic encephalopathy: Secondary | ICD-10-CM

## 2016-08-09 DIAGNOSIS — Z7189 Other specified counseling: Secondary | ICD-10-CM | POA: Diagnosis not present

## 2016-08-09 DIAGNOSIS — K7031 Alcoholic cirrhosis of liver with ascites: Secondary | ICD-10-CM

## 2016-08-09 DIAGNOSIS — F330 Major depressive disorder, recurrent, mild: Secondary | ICD-10-CM | POA: Diagnosis not present

## 2016-08-09 DIAGNOSIS — K7290 Hepatic failure, unspecified without coma: Secondary | ICD-10-CM | POA: Diagnosis not present

## 2016-08-09 NOTE — Progress Notes (Signed)
Location:  [place] Room Number: 569 Place of Service:  ALF (13)  Provider: Romano Stigger L. Mariea Clonts, D.O., C.M.D.  Code Status: DNR Goals of Care:  Advanced Directives 08/09/2016  Does Patient Have a Medical Advance Directive? Yes  Type of Advance Directive Out of facility DNR (pink MOST or yellow form);[place];Living will  Does patient want to make changes to medical advance directive? -  Copy of [place] in Chart? Yes  Would patient like information on creating a medical advance directive? -  Pre-existing out of facility DNR order (yellow form or pink MOST form) Yellow form placed in chart (order not valid for inpatient use);Pink MOST form placed in chart (order not valid for inpatient use)   Chief Complaint  Patient presents with  . Medical Management of Chronic Issues    6 week follow-up    HPI: Patient is a 66 y.o. male seen today for medical management of chronic diseases--6 week f/u.  Pt had at first been improving considerably, requiring less frequent paracenteses, mentation was staying stable, spirits were better, and metastatic prostate cancer to bone has been stable on his medication.  Unfortunately, just after Thanksgiving when there were thoughts about moving him to a regular AL room from AL enhanced, his mentation became altered and ammonia levels have trended up into the 200s.  Changes in his lactulose regimen have not been successful in clearing his encephalopathy, he's tremulous and miserable.  He has not been drinking any alcohol.  His back pain has grown considerably worse along with his worsening abdominal distention and ascites.  His scrotum remains grossly edematous and painful.  Since yesterday, he developed nausea, vomiting and diarrhea.  One caregiver reported he was vomiting last night and still today, but another said he did not have any vomiting today, just last night.  He remains nauseous now and  c/o pain in his back and abdomen.  He requests that he is ready to go and wants to sign on for hospice care at this point.  He wants to be more comfortable and his oxycodone 5/369m 2 q 8 prn is not adequate.    Past Medical History:  Diagnosis Date  . Acute alcoholic hepatitis   . Alcohol abuse with physiological dependence ([facility] 02/13/2012  . Alcoholic hepatitis with ascites 11/13/2014  . Anxiety state, unspecified   . Atrial fibrillation ([facility]   . Bacteremia, escherichia coli   . Bisphosphonate-associated osteonecrosis of the jaw ([facility] 04/28/2013   Localized area base of tooth #18 noted 8/14 by oral surgeon  . Cirrhosis, alcoholic ([facility]   . Coronary artery disease    s/p PCI to the LAD in 2011 with a BMS  . Depression   . Dysrhythmia   . Elevated prostate specific antigen (PSA)   . Essential and other specified forms of tremor   . Family history of malignant neoplasm of gastrointestinal tract   . Hepatorenal syndrome ([facility] 02/13/2016  . Hypocalcemia   . Malignant neoplasm of prostate ([facility] 11/2007   Mets to Bone  . Other and unspecified hyperlipidemia   . Personal history of colonic polyps   . Pneumonia    childhood  . Symptomatic anemia 02/13/2016  . Unspecified essential hypertension     Past Surgical History:  Procedure Laterality Date  . CARDIAC CATHETERIZATION    . CARDIOVERSION N/A 08/18/2015   Procedure: CARDIOVERSION;  Surgeon: PThayer Headings MD;  [facility] [facility]  Service: Cardiovascular;  Laterality:  N/A;  . CHOLECYSTECTOMY    . COLONOSCOPY W/ BIOPSIES    . CORONARY STENT PLACEMENT  2011  . TEE WITHOUT CARDIOVERSION N/A 08/18/2015   Procedure: TRANSESOPHAGEAL ECHOCARDIOGRAM (TEE);  Surgeon: Thayer Headings, MD;  Location: [facility] [place];  Service: Cardiovascular;  Laterality: N/A;  . VASECTOMY      No Known Allergies  Allergies as of 08/09/2016   No Known Allergies     Medication List       Accurate as of 08/09/16 11:59 PM. Always use your most recent med  list.          CALCIUM + D3 PO Take 1 tablet by mouth 2 (two) times daily.   CVS ASPIRIN LOW DOSE 81 MG EC tablet Generic drug:  aspirin Take 81 mg by mouth daily.   CVS BALANCED B-100 PO Take 1 tablet by mouth daily.   feeding supplement (ENSURE) Pudg Take 1 Container by mouth 2 (two) times daily between meals.   folic acid 1 MG tablet Commonly known as:  FOLVITE Take 1 mg by mouth daily.   furosemide 40 MG tablet Commonly known as:  LASIX Take 1 tablet (40 mg total) by mouth daily.   ipratropium-albuterol 0.5-2.5 (3) MG/3ML Soln Commonly known as:  DUONEB Take 3 mLs by nebulization every 4 (four) hours as needed.   lactulose 10 GM/15ML solution Commonly known as:  CHRONULAC ON HOLD WHILE HAVING DIARRHEA   LORazepam 0.5 MG tablet Commonly known as:  ATIVAN Take 0.5 mg by mouth once a week.   multivitamin with minerals Tabs tablet Take 1 tablet by mouth daily with breakfast.   nadolol 20 MG tablet Commonly known as:  CORGARD Take 1 tablet (20 mg total) by mouth daily.   oxyCODONE 5 MG immediate release tablet Commonly known as:  Oxy IR/ROXICODONE Take 5 mg by mouth every 8 (eight) hours as needed. For pain scale 1-5 and take 2 tablets by mouth every 8 hours as needed for pain scale 6-10   Oxycodone HCl 10 MG Tabs Take 1 tablet (10 mg total) by mouth at bedtime.   rifaximin 550 MG Tabs tablet Commonly known as:  XIFAXAN Take 1 tablet (550 mg total) by mouth 2 (two) times daily.   sertraline 50 MG tablet Commonly known as:  ZOLOFT Take 50 mg by mouth daily.   spironolactone 100 MG tablet Commonly known as:  ALDACTONE Take 100 mg by mouth daily.   thiamine 100 MG tablet Take 100 mg by mouth daily.   VANTAS 50 MG Kit Generic drug:  Histrelin Acetate   zinc oxide 20 % ointment Apply 1 application topically as needed for irritation.       Review of Systems:  Review of Systems  Constitutional: Positive for malaise/fatigue. Negative for chills and  fever.  HENT: Negative for hearing loss.   Eyes: Negative for blurred vision.  Respiratory: Negative for cough and shortness of breath.   Cardiovascular: Negative for chest pain, palpitations and leg swelling.  Gastrointestinal: Positive for abdominal pain. Negative for blood in stool, constipation, diarrhea and melena.  Genitourinary: Negative for dysuria.  Musculoskeletal: Positive for back pain. Negative for falls.  Skin: Negative for itching and rash.  Neurological: Positive for weakness. Negative for dizziness.  Psychiatric/Behavioral: Positive for depression. Negative for suicidal ideas.       Encephalopathy--confusion, word finding difficulty, flap    Health Maintenance  Topic Date Due  . TETANUS/TDAP  11/08/1967  . ZOSTAVAX  11/07/2008  . PNA vac Low  Risk Adult (1 of 2 - PCV13) 11/07/2013  . INFLUENZA VACCINE  03/07/2016  . COLONOSCOPY  10/29/2017  . Hepatitis C Screening  Completed    Physical Exam: Vitals:   08/09/16 1307  BP: 90/60  Pulse: 68  Resp: (!) 22  Temp: 97.2 F (36.2 C)  SpO2: 95%   There is no height or weight on file to calculate BMI. Physical Exam  Constitutional:  Writhing around in the bed in discomfort  HENT:  Head: Normocephalic and atraumatic.  Cardiovascular: Normal rate, regular rhythm, normal heart sounds and intact distal pulses.   Pulmonary/Chest: Effort normal and breath sounds normal. No respiratory distress.  Abdominal: Soft. Bowel sounds are normal.  Ascites, tenderness throughout abdomen  Genitourinary:  Genitourinary Comments: Swelling and erythema of scrotum  Musculoskeletal: Normal range of motion.  Neurological: No cranial nerve deficit.  Lethargic,  difficulty getting his words out today, keeps closing his eyes, has asterixis   Skin: Skin is warm and dry.  Slightly jaundice-appearing again    Labs reviewed: Basic Metabolic Panel:  Recent Labs  08/16/15 1804  02/13/16 1415  03/28/16 1642 04/04/16 1030  04/06/16 1057 04/15/16 1639 05/10/16 0300 07/21/16 0600  NA 134*  < >  --   < > 125* 129* 129* 134* 133* 132*  K 3.5  < >  --   < > 4.9 4.7 5.2* 4.4 3.7 4.6  CL 99*  < >  --   < > 94* 96*  --  97*  --   --   CO2 22  < >  --   < > 24 24 24   --   --   --   GLUCOSE 110*  < >  --   < > 104* 73 81 83  --   --   BUN 9  < >  --   < > 32* 35* 32.4* 20 10 18   CREATININE 1.07  < >  --   < > 1.40* 1.37* 1.4* 1.20 1.0 0.9  CALCIUM 8.8*  < >  --   < > 7.8* 8.1* 8.2*  --   --   --   MG 2.1  --  2.5*  --   --   --   --   --   --   --   PHOS  --   --  4.7*  --   --   --   --   --   --   --   TSH 3.376  --   --   --   --   --   --   --   --   --   < > = values in this interval not displayed. Liver Function Tests:  Recent Labs  03/28/16 1642 04/04/16 1030 04/06/16 1057 05/10/16 0300 07/21/16 0600  AST 49* 44* 44* 30 40  ALT 54 42 43 19 19  ALKPHOS 157* 142* 172* 109 122  BILITOT 4.4* 3.9* 4.17*  --   --   PROT 5.2* 5.2* 5.7*  --   --   ALBUMIN 2.4* 2.4* 2.4*  --   --    No results for input(s): LIPASE, AMYLASE in the last 8760 hours.  Recent Labs  02/13/16 0936 02/14/16 0528 04/04/16 1500  AMMONIA 19 61* 65*   CBC:  Recent Labs  03/28/16 1642 04/04/16 1030 04/06/16 1057 04/15/16 1639 07/21/16 0600  WBC 14.2* 12.7* 11.9*  --  7.0  NEUTROABS 12.0* 9.6* 9.9*  --   --  HGB 9.5* 9.3* 9.5* 8.8* 8.8*  HCT 26.8* 26.6* 27.3* 26.0* 26*  MCV 117.3* 118.2* 113.8*  --   --   PLT 158 107* 111*  --  93*   Lipid Panel: No results for input(s): CHOL, HDL, LDLCALC, TRIG, CHOLHDL, LDLDIRECT in the last 8760 hours. Lab Results  Component Value Date   HGBA1C 4.6 (L) 12/21/2014    Assessment/Plan 1. Viral gastroenteritis -treating symptoms with phenergan and fluids orally so far -would not check bmp or given IVFs due to goals of care--palliative and he is now requesting hospice  2. Encephalopathy, hepatic ([place]) -is getting worse despite intervention and pt feels ready for hospice care  now  3. Alcoholic cirrhosis of liver with ascites (HCC) -ongoing, cause of #2, is end stage and due for another paracentesis which is scheduled for comfort  4. Hepatorenal syndrome ([place]) -we are not checking labs to reassess this at this point due to his goals  5. Alcohol abuse with physiological dependence ([place]) -has not been drinking since his admission to [facility] enhanced AL  6. Mild episode of recurrent major depressive disorder ([place]) -he is on medication now for depression and had been doing better, but spirits are down again since his condition overall has declined  7. Advance care planning -majority of visit (25 mins) was focused on goals of care and patient clearly states he now wants to transition to hospice care  -we also made medication modifications for pain control--change to oxycodone liquid 29m/ml 2 mL q 4 hr for pain -I called the palliative care nurse and left a message and staff were going to take care of being sure hospice care was directly contacted to come see him  Labs/tests ordered:  Hospice care  TKnowlton Heston Widener, D.O. [facility] [address] [place] [address]Cell Phone (Mon-Fri 8am-5pm):  [phone] Call:  [phone]& follow prompts after 5pm & weekends Office Phone:  [phone] Fax:  [phone]

## 2016-08-10 ENCOUNTER — Encounter: Payer: Self-pay | Admitting: Internal Medicine

## 2016-08-15 ENCOUNTER — Encounter: Payer: Self-pay | Admitting: Internal Medicine

## 2016-08-15 ENCOUNTER — Ambulatory Visit (INDEPENDENT_AMBULATORY_CARE_PROVIDER_SITE_OTHER): Payer: Medicare Other | Admitting: Internal Medicine

## 2016-08-15 VITALS — BP 82/50 | HR 60 | Ht 74.0 in | Wt 187.8 lb

## 2016-08-15 DIAGNOSIS — N5089 Other specified disorders of the male genital organs: Secondary | ICD-10-CM | POA: Diagnosis not present

## 2016-08-15 DIAGNOSIS — K7290 Hepatic failure, unspecified without coma: Secondary | ICD-10-CM

## 2016-08-15 DIAGNOSIS — K7682 Hepatic encephalopathy: Secondary | ICD-10-CM

## 2016-08-15 DIAGNOSIS — K7031 Alcoholic cirrhosis of liver with ascites: Secondary | ICD-10-CM | POA: Diagnosis not present

## 2016-08-15 NOTE — Progress Notes (Signed)
Allen Schroeder 66 y.o. [birth year] [id number]  Assessment & Plan:   Encounter Diagnoses  Name Primary?  . Ascites due to alcoholic cirrhosis ([place]) Yes  . Scrotal edema   . Encephalopathy, hepatic (HCC)    His ascites is less but still having scrotal edema - ? Trapped ascites vs hydrocele - right side > L so assymetry. Overall I think liver disease better with the prolonged abstinence.  Will have him get abd [place] w/ ? Paracentesis if able and also do scrotal US.  Try scrotal support  They will call Dr. Era Bumpers for an appt  Increase furosemide to 60 and spironolactone to 150    I reviewed differences between palliative and hospice care to best of my ability - seems to me that palliative care + DNR a good approach at this time - there remains some tension about this between patient and wife it is my sense. He definitely wants good pain control and maximal quality of life without aggressive interventions if at all possible   IO:EVOJ, TIFFANY, DO, [name], MD Thurston Pounds, MD  RTC 3 mos  Subjective:   Chief Complaint: scrotal swelling  HPI Here for f/u alcoholic cirrhosis w/ ascites and encephalopathy. Stopped every 2 week [place] as not enough ascites to do paracentesis. Still having some abdominal distention but persistent scrotal edema, difficulty with urination - hard to start and maintain stream, some leakage, also sometimes hard to manipulate penis in setting of edema. Scrotum is sore. Denies overt confusion, excessive somnolence.  No Known Allergies Outpatient Medications Prior to Visit  Medication Sig Dispense Refill  . Calcium Carb-Cholecalciferol (CALCIUM + D3 PO) Take 1 tablet by mouth 2 (two) times daily.    . feeding supplement, ENSURE, (ENSURE) PUDG Take 1 Container by mouth 2 (two) times daily between meals.    . folic acid (FOLVITE) 1 MG tablet Take 1 mg by mouth daily.  11  . furosemide (LASIX) 40 MG tablet Take 1 tablet (40 mg total) by mouth daily. 30  tablet 11  . Histrelin Acetate (VANTAS) 50 MG KIT     . ipratropium-albuterol (DUONEB) 0.5-2.5 (3) MG/3ML SOLN Take 3 mLs by nebulization every 4 (four) hours as needed.    . lactulose (CHRONULAC) 10 GM/15ML solution ON HOLD WHILE HAVING DIARRHEA    . LORazepam (ATIVAN) 0.5 MG tablet Take 0.5 mg by mouth once a week.     . Multiple Vitamin (MULTIVITAMIN WITH MINERALS) TABS tablet Take 1 tablet by mouth daily with breakfast.    . nadolol (CORGARD) 20 MG tablet Take 1 tablet (20 mg total) by mouth daily.    [name] oxyCODONE (OXY IR/ROXICODONE) 5 MG immediate release tablet Take 5 mg by mouth every 8 (eight) hours as needed. For pain scale 1-5 and take 2 tablets by mouth every 8 hours as needed for pain scale 6-10    . rifaximin (XIFAXAN) 550 MG TABS tablet Take 1 tablet (550 mg total) by mouth 2 (two) times daily. 60 tablet 0  . sertraline (ZOLOFT) 50 MG tablet Take 50 mg by mouth daily.    [name] spironolactone (ALDACTONE) 100 MG tablet Take 100 mg by mouth daily.   11  . thiamine 100 MG tablet Take 100 mg by mouth daily.    . Vitamins-Lipotropics (CVS BALANCED B-100 PO) Take 1 tablet by mouth daily.    [name] zinc oxide 20 % ointment Apply 1 application topically as needed for irritation.    . CVS ASPIRIN LOW DOSE 81  MG EC tablet Take 81 mg by mouth daily.  10  . Oxycodone HCl 10 MG TABS Take 1 tablet (10 mg total) by mouth at bedtime. 30 tablet 0   No facility-administered medications prior to visit.    Past Medical History:  Diagnosis Date  . Acute alcoholic hepatitis   . Alcohol abuse with physiological dependence ([place]) 02/13/2012  . Alcoholic hepatitis with ascites 11/13/2014  . Anxiety state, unspecified   . Atrial fibrillation ([place])   . Bacteremia, escherichia coli   . Bisphosphonate-associated osteonecrosis of the jaw ([place]) 04/28/2013   Localized area base of tooth #18 noted 8/14 by oral surgeon  . Cirrhosis, alcoholic ([place])   . Coronary artery disease    s/p PCI to the LAD in 2011 with a BMS  .  Depression   . Dysrhythmia   . Elevated prostate specific antigen (PSA)   . Essential and other specified forms of tremor   . Family history of malignant neoplasm of gastrointestinal tract   . Hepatorenal syndrome ([place]) 02/13/2016  . Hypocalcemia   . Malignant neoplasm of prostate ([place]) 11/2007   Mets to Bone  . Other and unspecified hyperlipidemia   . Personal history of colonic polyps   . Pneumonia    childhood  . Symptomatic anemia 02/13/2016  . Unspecified essential hypertension    Past Surgical History:  Procedure Laterality Date  . CARDIAC CATHETERIZATION    . CARDIOVERSION N/A 08/18/2015   Procedure: CARDIOVERSION;  Surgeon: Thayer Headings, MD;  Location: [facility] ENDOSCOPY;  Service: Cardiovascular;  Laterality: N/A;  . CHOLECYSTECTOMY    . COLONOSCOPY W/ BIOPSIES    . CORONARY STENT PLACEMENT  2011  . TEE WITHOUT CARDIOVERSION N/A 08/18/2015   Procedure: TRANSESOPHAGEAL ECHOCARDIOGRAM (TEE);  Surgeon: Thayer Headings, MD;  Location: [place];  Service: Cardiovascular;  Laterality: N/A;  . VASECTOMY     Social History   Social History  . Marital status: Married    Spouse name: N/A  . Number of children: 3  . Years of education: N/A   Occupational History  . lawyer     [employer] History Main Topics  . Smoking status: Former Smoker    Types: Cigarettes  . Smokeless tobacco: Never Used     Comment: stopped in college  . Alcohol use 1.2 - 1.8 oz/week    2 - 3 Shots of liquor per week     Comment: has consumed as much as a 1/2 gallon of vodka in a day, now drinks at least 3-5 drinks per day.  Believed abstinent - living in [place] 08/2016  . Drug use: No  . Sexual activity: No   Other Topics Concern  . None   Social History Narrative   Patient is married. 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 He has two sons, one daughter and 5 grandchildren. Both sons work in the business. Daughter lives in [place]. Close knit family.    11/18/2014         Tobacco use, amount per day now: RARELY-- FEW MONTHS WAY BACK   Past tobacco use, amount per day: 25 YEARS AGO   How many years did you use tobacco: SEVERAL MONTHS AGO   Alcohol use (drinks per week): IN RECOVERY   Diet: EATS WELL AND HEALTHY, LOVES  SALT, LIMITING IT NOW   Do you drink/eat things with caffeine: RARELY   Marital status: MARRIED                            What year were you married? 1975   Do you live in a house, apartment, assisted living, condo, trailer, etc.? HOUSE   Is it one or more stories? YES, 2 MAIN/ 1 BASEMENT & PLAYROOM AREA/ 1 FINISHED   How many persons live in your home? JUST 2 BUT KIDS ARE OFTEN HERE   Do you have pets in your home?( please list) NO   Current or past profession: FINANCE OFFICER, [employer]/ [employer] SECURITY?   Do you exercise?     YES                             Type and how often? NOT MUCH NOW WITH PHYSICAL CHALLENGES   Do you have a living will? YES   Do you have a DNR form?  YES                                 If not, do you want to discuss one?   Do you have signed POA/HPOA forms?    YES                    If so, please bring to you appointment      Family History  Problem Relation Age of Onset  . Prostate cancer Father   . Coronary artery disease Father   . Colon cancer Father   . Aneurysm Mother   . Prostate cancer Brother         Review of Systems As above - has difficulty walking, has bone pain helped by narcotics  Objective:   Physical Exam @BP  (!) 82/50   Pulse 60   Ht 6' 2"  (1.88 m)   Wt 187 lb 12.8 oz (85.2 kg)   BMI 24.11 kg/m @  General:  Well-developed, well-nourished and in no acute distress Eyes:  Mildly icteric.   Lungs: Clear to auscultation bilaterally. Heart:  S1S2, no rubs,2/6 sys m murmurs, gallops. Abdomen:  soft, non-tender, no  hepatosplenomegaly, hernia, or mass and BS+. Distended - some ascites GU:   R> L scrotal enlargement ? Hydrocele vs ascites - seems less than when last seen, no skin breakdown, non-tender, NL uncirc penis Lymph:  no cervical or supraclavicular adenopathy. Extremities:   2+ edema, cyanosis or clubbing Skin   no rash. Neuro:  A&O x 2 + mild asterixis Psych:  appropriate mood and  Affect.   Data Reviewed: 12/17 labs Hgb 8 bili 2.1Na 132 creat 0.9

## 2016-08-15 NOTE — Patient Instructions (Addendum)
  You have been scheduled for an scrotal ultrasound and paracentesis of your abdominal area if needed  at [facility] Radiology (1st floor of hospital) on 08/18/16 at 10:00AM. Please arrive 15 minutes prior to your appointment for registration. Make certain not to have anything to eat or drink 6 hours prior to your appointment. Should you need to reschedule your appointment, please contact radiology at [phone].     Call Dr Gaynelle Arabian at [phone] to set up an appointment.    We are adjusting your furosemide to 60mg  every AM.   We are adjusting your spironolactone to 150mg  every AM.  Follow up with Dr Carlean Purl in 3 months.   I appreciate the opportunity to care for you. Silvano Rusk, MD, [facility]

## 2016-08-16 ENCOUNTER — Telehealth: Payer: Self-pay

## 2016-08-16 NOTE — Telephone Encounter (Signed)
Spoke with [facility] at # [phone] and gave his nurse verbal orders for a scrotal support for his edema per Dr Carlean Purl.

## 2016-08-16 NOTE — Telephone Encounter (Signed)
-----   Message from Gatha Mayer, MD sent at 08/15/2016 10:18 PM EST ----- Regarding: scrotal support I forgot to order a scrotal support Would you call [facility] and do a verbal order for them to get one for him re: scrotal edema  Thx

## 2016-08-16 NOTE — Telephone Encounter (Signed)
Patient was seen on 08-09-16

## 2016-08-18 ENCOUNTER — Ambulatory Visit (HOSPITAL_COMMUNITY)
Admission: RE | Admit: 2016-08-18 | Discharge: 2016-08-18 | Disposition: A | Discharge status: Home / Self Care | Payer: Medicare Other | Source: Ambulatory Visit | Attending: Internal Medicine | Admitting: Internal Medicine

## 2016-08-18 DIAGNOSIS — N433 Hydrocele, unspecified: Secondary | ICD-10-CM | POA: Diagnosis not present

## 2016-08-18 DIAGNOSIS — R188 Other ascites: Secondary | ICD-10-CM | POA: Diagnosis not present

## 2016-08-18 DIAGNOSIS — N5089 Other specified disorders of the male genital organs: Secondary | ICD-10-CM

## 2016-08-18 DIAGNOSIS — K7031 Alcoholic cirrhosis of liver with ascites: Secondary | ICD-10-CM

## 2016-08-18 LAB — BODY FLUID CELL COUNT WITH DIFFERENTIAL
EOS FL: 0 %
Lymphs, Fluid: 27 %
MONOCYTE-MACROPHAGE-SEROUS FLUID: 73 % (ref 50–90)
NEUTROPHIL FLUID: 0 % (ref 0–25)
WBC FLUID: 320 uL (ref 0–1000)

## 2016-08-18 NOTE — Procedures (Signed)
Ultrasound-guided diagnostic and therapeutic paracentesis performed yielding 1.5 liters of turbid, yellow  fluid. No immediate complications. A portion of the fluid was sent to the lab for cell count/diff and culture.

## 2016-08-21 LAB — PATHOLOGIST SMEAR REVIEW

## 2016-08-21 NOTE — Progress Notes (Signed)
Let patient/[facility] + wife now the fluid in the scrotum is coming from the ascites   1) Hope the paracentesis helped 2) Do not think he needs to see Dr. Era Bumpers for this now that [place] is back 3) I have increased diuretics and hopefully that will make a difference - chart does not reflect it but sent orders to [facility] so that furosemide is 60 and spironolactone is 150 qd 4) Let's get an update in 2 weeks re: is scrotal swelling better and also do a BMET then also dx ascites 5) will consider higher dose of diuretics vs retap after # 4

## 2016-08-22 DIAGNOSIS — N433 Hydrocele, unspecified: Secondary | ICD-10-CM | POA: Diagnosis not present

## 2016-08-23 LAB — BODY FLUID CULTURE
CULTURE: NO GROWTH
SPECIAL REQUESTS: NORMAL

## 2016-08-25 DIAGNOSIS — R531 Weakness: Secondary | ICD-10-CM | POA: Diagnosis not present

## 2016-08-27 ENCOUNTER — Telehealth: Payer: Self-pay | Admitting: Gastroenterology

## 2016-08-27 NOTE — Telephone Encounter (Signed)
I got a page regarding this patient regarding call about a rash. I called back and no answer, left message

## 2016-08-27 NOTE — Telephone Encounter (Signed)
Beth called from [facility]. Patient has what sounds like a painful cellulitis on his scrotum, despite recently being treated with Cephlex and is also on Cipro, and getting worse recently and they are concerned. Recommend they try amoxicillin 875mg  BID and add bactrim 1 DS tab BID to treat empirically for cellulitis and cover MRSA given his recent antibiotic exposure. They deny he has any drug allergies and confirmed recommendations   Ed Blalock

## 2016-08-28 ENCOUNTER — Other Ambulatory Visit: Payer: Self-pay | Admitting: *Deleted

## 2016-08-28 MED ORDER — OXYCODONE HCL 5 MG/5ML PO SOLN: ORAL | 0 refills | Status: AC

## 2016-08-28 NOTE — Telephone Encounter (Signed)
[facility] #[phone] Fax: [phone]  

## 2016-08-29 ENCOUNTER — Telehealth: Payer: Self-pay

## 2016-08-29 MED ORDER — AMOXICILLIN-POT CLAVULANATE 875-125 MG PO TABS: 1.0000 | ORAL_TABLET | Freq: Two times a day (BID) | ORAL | 0 refills | Status: AC

## 2016-08-29 MED ORDER — SULFAMETHOXAZOLE-TRIMETHOPRIM 800-160 MG PO TABS: 1.0000 | ORAL_TABLET | Freq: Two times a day (BID) | ORAL | 0 refills | Status: AC

## 2016-08-29 NOTE — Telephone Encounter (Signed)
  Faxed the orders to [facility] at fax # [phone] for Amoxicillin and Bactrim DS , so they will have them on file , he is going to take them for 7 days.

## 2016-08-29 NOTE — Telephone Encounter (Signed)
Beth notified of orders.

## 2016-08-29 NOTE — Telephone Encounter (Signed)
Florastor 250 mg bid x 1 month

## 2016-08-29 NOTE — Telephone Encounter (Signed)
Beth from [facility] called this am.  She is requesting a Careers information officer for [employer] for Allen Schroeder due to the antibiotics.  She reports that the scrotal swelling nor the cellulitis have improved.  Wife reports that she is also taking him back to see Dr. Gaynelle Arabian. Please advise

## 2016-08-31 ENCOUNTER — Telehealth: Payer: Self-pay

## 2016-08-31 DIAGNOSIS — R531 Weakness: Secondary | ICD-10-CM | POA: Diagnosis not present

## 2016-08-31 NOTE — Telephone Encounter (Signed)
Message left on clinical intake voicemail:   Rodena Piety with Hospice called to inform Dr.Reed that patient will transfer to Hospice. Dr.Reed gave an order for this in the past yet this time the family is requesting that patient's GI doctor be the attending physician.  Rodena Piety would like to discuss this with Dr.Reed before calling the GI doctor.

## 2016-08-31 NOTE — Telephone Encounter (Signed)
It's fine with me if he wants Dr. Carlean Purl to be attending, but then the [place] staff will need to know to call him not me for all of the problems related to his cirrhosis

## 2016-08-31 NOTE — Telephone Encounter (Signed)
Spoke with hospice nurse and advised results, they will now call Dr. Carlean Purl and see if he will agree.

## 2016-09-02 DIAGNOSIS — R319 Hematuria, unspecified: Secondary | ICD-10-CM | POA: Diagnosis not present

## 2016-09-02 DIAGNOSIS — R531 Weakness: Secondary | ICD-10-CM | POA: Diagnosis not present

## 2016-09-04 DIAGNOSIS — K7011 Alcoholic hepatitis with ascites: Secondary | ICD-10-CM | POA: Diagnosis not present

## 2016-09-04 DIAGNOSIS — K219 Gastro-esophageal reflux disease without esophagitis: Secondary | ICD-10-CM | POA: Diagnosis not present

## 2016-09-04 DIAGNOSIS — G9341 Metabolic encephalopathy: Secondary | ICD-10-CM | POA: Diagnosis not present

## 2016-09-04 DIAGNOSIS — R630 Anorexia: Secondary | ICD-10-CM | POA: Diagnosis not present

## 2016-09-04 DIAGNOSIS — R188 Other ascites: Secondary | ICD-10-CM | POA: Diagnosis not present

## 2016-09-04 DIAGNOSIS — K7040 Alcoholic hepatic failure without coma: Secondary | ICD-10-CM | POA: Diagnosis not present

## 2016-09-04 DIAGNOSIS — R110 Nausea: Secondary | ICD-10-CM | POA: Diagnosis not present

## 2016-09-04 DIAGNOSIS — K7031 Alcoholic cirrhosis of liver with ascites: Secondary | ICD-10-CM | POA: Diagnosis not present

## 2016-09-05 ENCOUNTER — Telehealth: Payer: Self-pay | Admitting: Internal Medicine

## 2016-09-05 DIAGNOSIS — R188 Other ascites: Secondary | ICD-10-CM | POA: Diagnosis not present

## 2016-09-05 DIAGNOSIS — K7040 Alcoholic hepatic failure without coma: Secondary | ICD-10-CM | POA: Diagnosis not present

## 2016-09-05 DIAGNOSIS — R630 Anorexia: Secondary | ICD-10-CM | POA: Diagnosis not present

## 2016-09-05 DIAGNOSIS — G9341 Metabolic encephalopathy: Secondary | ICD-10-CM | POA: Diagnosis not present

## 2016-09-05 DIAGNOSIS — K219 Gastro-esophageal reflux disease without esophagitis: Secondary | ICD-10-CM | POA: Diagnosis not present

## 2016-09-05 DIAGNOSIS — K7031 Alcoholic cirrhosis of liver with ascites: Secondary | ICD-10-CM | POA: Diagnosis not present

## 2016-09-05 NOTE — Telephone Encounter (Signed)
Spoke to son and answered ? About father's illnesses and estimate of life expectancy

## 2016-09-06 ENCOUNTER — Encounter: Payer: Self-pay | Admitting: Internal Medicine

## 2016-09-06 DIAGNOSIS — K219 Gastro-esophageal reflux disease without esophagitis: Secondary | ICD-10-CM | POA: Diagnosis not present

## 2016-09-06 DIAGNOSIS — G9341 Metabolic encephalopathy: Secondary | ICD-10-CM | POA: Diagnosis not present

## 2016-09-06 DIAGNOSIS — K7040 Alcoholic hepatic failure without coma: Secondary | ICD-10-CM | POA: Diagnosis not present

## 2016-09-06 DIAGNOSIS — K7031 Alcoholic cirrhosis of liver with ascites: Secondary | ICD-10-CM | POA: Diagnosis not present

## 2016-09-06 DIAGNOSIS — R188 Other ascites: Secondary | ICD-10-CM | POA: Diagnosis not present

## 2016-09-06 DIAGNOSIS — R630 Anorexia: Secondary | ICD-10-CM | POA: Diagnosis not present

## 2016-09-07 DIAGNOSIS — C7951 Secondary malignant neoplasm of bone: Secondary | ICD-10-CM | POA: Diagnosis not present

## 2016-09-07 DIAGNOSIS — K7040 Alcoholic hepatic failure without coma: Secondary | ICD-10-CM | POA: Diagnosis not present

## 2016-09-07 DIAGNOSIS — R110 Nausea: Secondary | ICD-10-CM | POA: Diagnosis not present

## 2016-09-07 DIAGNOSIS — K7031 Alcoholic cirrhosis of liver with ascites: Secondary | ICD-10-CM | POA: Diagnosis not present

## 2016-09-07 DIAGNOSIS — I25810 Atherosclerosis of coronary artery bypass graft(s) without angina pectoris: Secondary | ICD-10-CM | POA: Diagnosis not present

## 2016-09-07 DIAGNOSIS — G9341 Metabolic encephalopathy: Secondary | ICD-10-CM | POA: Diagnosis not present

## 2016-09-07 DIAGNOSIS — R188 Other ascites: Secondary | ICD-10-CM | POA: Diagnosis not present

## 2016-09-07 DIAGNOSIS — K219 Gastro-esophageal reflux disease without esophagitis: Secondary | ICD-10-CM | POA: Diagnosis not present

## 2016-09-07 DIAGNOSIS — R630 Anorexia: Secondary | ICD-10-CM | POA: Diagnosis not present

## 2016-09-07 DIAGNOSIS — C61 Malignant neoplasm of prostate: Secondary | ICD-10-CM | POA: Diagnosis not present

## 2016-09-08 DIAGNOSIS — K7031 Alcoholic cirrhosis of liver with ascites: Secondary | ICD-10-CM | POA: Diagnosis not present

## 2016-09-08 DIAGNOSIS — G9341 Metabolic encephalopathy: Secondary | ICD-10-CM | POA: Diagnosis not present

## 2016-09-08 DIAGNOSIS — K219 Gastro-esophageal reflux disease without esophagitis: Secondary | ICD-10-CM | POA: Diagnosis not present

## 2016-09-08 DIAGNOSIS — K7040 Alcoholic hepatic failure without coma: Secondary | ICD-10-CM | POA: Diagnosis not present

## 2016-09-08 DIAGNOSIS — R188 Other ascites: Secondary | ICD-10-CM | POA: Diagnosis not present

## 2016-09-08 DIAGNOSIS — R630 Anorexia: Secondary | ICD-10-CM | POA: Diagnosis not present

## 2016-09-09 DIAGNOSIS — K7040 Alcoholic hepatic failure without coma: Secondary | ICD-10-CM | POA: Diagnosis not present

## 2016-09-09 DIAGNOSIS — G9341 Metabolic encephalopathy: Secondary | ICD-10-CM | POA: Diagnosis not present

## 2016-09-09 DIAGNOSIS — R630 Anorexia: Secondary | ICD-10-CM | POA: Diagnosis not present

## 2016-09-09 DIAGNOSIS — R188 Other ascites: Secondary | ICD-10-CM | POA: Diagnosis not present

## 2016-09-09 DIAGNOSIS — K7031 Alcoholic cirrhosis of liver with ascites: Secondary | ICD-10-CM | POA: Diagnosis not present

## 2016-09-09 DIAGNOSIS — K219 Gastro-esophageal reflux disease without esophagitis: Secondary | ICD-10-CM | POA: Diagnosis not present

## 2016-09-10 DIAGNOSIS — K7031 Alcoholic cirrhosis of liver with ascites: Secondary | ICD-10-CM | POA: Diagnosis not present

## 2016-09-10 DIAGNOSIS — K7040 Alcoholic hepatic failure without coma: Secondary | ICD-10-CM | POA: Diagnosis not present

## 2016-09-10 DIAGNOSIS — K219 Gastro-esophageal reflux disease without esophagitis: Secondary | ICD-10-CM | POA: Diagnosis not present

## 2016-09-10 DIAGNOSIS — R188 Other ascites: Secondary | ICD-10-CM | POA: Diagnosis not present

## 2016-09-10 DIAGNOSIS — R630 Anorexia: Secondary | ICD-10-CM | POA: Diagnosis not present

## 2016-09-10 DIAGNOSIS — G9341 Metabolic encephalopathy: Secondary | ICD-10-CM | POA: Diagnosis not present

## 2016-09-11 ENCOUNTER — Encounter: Payer: Self-pay | Admitting: *Deleted

## 2016-09-11 ENCOUNTER — Telehealth: Payer: Self-pay | Admitting: *Deleted

## 2016-09-11 DIAGNOSIS — R630 Anorexia: Secondary | ICD-10-CM | POA: Diagnosis not present

## 2016-09-11 DIAGNOSIS — K7031 Alcoholic cirrhosis of liver with ascites: Secondary | ICD-10-CM | POA: Diagnosis not present

## 2016-09-11 DIAGNOSIS — R188 Other ascites: Secondary | ICD-10-CM | POA: Diagnosis not present

## 2016-09-11 DIAGNOSIS — G9341 Metabolic encephalopathy: Secondary | ICD-10-CM | POA: Diagnosis not present

## 2016-09-11 DIAGNOSIS — K7040 Alcoholic hepatic failure without coma: Secondary | ICD-10-CM | POA: Diagnosis not present

## 2016-09-11 DIAGNOSIS — K219 Gastro-esophageal reflux disease without esophagitis: Secondary | ICD-10-CM | POA: Diagnosis not present

## 2016-09-11 NOTE — Telephone Encounter (Signed)
[facility] calling to say patient has been referred to hospice, cancels future appts.

## 2016-09-12 DIAGNOSIS — R188 Other ascites: Secondary | ICD-10-CM | POA: Diagnosis not present

## 2016-09-12 DIAGNOSIS — K7031 Alcoholic cirrhosis of liver with ascites: Secondary | ICD-10-CM | POA: Diagnosis not present

## 2016-09-12 DIAGNOSIS — G9341 Metabolic encephalopathy: Secondary | ICD-10-CM | POA: Diagnosis not present

## 2016-09-12 DIAGNOSIS — K219 Gastro-esophageal reflux disease without esophagitis: Secondary | ICD-10-CM | POA: Diagnosis not present

## 2016-09-12 DIAGNOSIS — R630 Anorexia: Secondary | ICD-10-CM | POA: Diagnosis not present

## 2016-09-12 DIAGNOSIS — K7040 Alcoholic hepatic failure without coma: Secondary | ICD-10-CM | POA: Diagnosis not present

## 2016-09-13 DIAGNOSIS — G9341 Metabolic encephalopathy: Secondary | ICD-10-CM | POA: Diagnosis not present

## 2016-09-13 DIAGNOSIS — K219 Gastro-esophageal reflux disease without esophagitis: Secondary | ICD-10-CM | POA: Diagnosis not present

## 2016-09-13 DIAGNOSIS — K7031 Alcoholic cirrhosis of liver with ascites: Secondary | ICD-10-CM | POA: Diagnosis not present

## 2016-09-13 DIAGNOSIS — K7040 Alcoholic hepatic failure without coma: Secondary | ICD-10-CM | POA: Diagnosis not present

## 2016-09-13 DIAGNOSIS — R630 Anorexia: Secondary | ICD-10-CM | POA: Diagnosis not present

## 2016-09-13 DIAGNOSIS — R188 Other ascites: Secondary | ICD-10-CM | POA: Diagnosis not present

## 2016-09-15 ENCOUNTER — Encounter: Payer: Self-pay | Admitting: Internal Medicine

## 2016-09-15 DIAGNOSIS — K219 Gastro-esophageal reflux disease without esophagitis: Secondary | ICD-10-CM | POA: Diagnosis not present

## 2016-09-15 DIAGNOSIS — K7031 Alcoholic cirrhosis of liver with ascites: Secondary | ICD-10-CM | POA: Diagnosis not present

## 2016-09-15 DIAGNOSIS — K7040 Alcoholic hepatic failure without coma: Secondary | ICD-10-CM | POA: Diagnosis not present

## 2016-09-15 DIAGNOSIS — G9341 Metabolic encephalopathy: Secondary | ICD-10-CM | POA: Diagnosis not present

## 2016-09-15 DIAGNOSIS — R630 Anorexia: Secondary | ICD-10-CM | POA: Diagnosis not present

## 2016-09-15 DIAGNOSIS — R188 Other ascites: Secondary | ICD-10-CM | POA: Diagnosis not present

## 2016-09-16 DIAGNOSIS — R630 Anorexia: Secondary | ICD-10-CM | POA: Diagnosis not present

## 2016-09-16 DIAGNOSIS — G9341 Metabolic encephalopathy: Secondary | ICD-10-CM | POA: Diagnosis not present

## 2016-09-16 DIAGNOSIS — R188 Other ascites: Secondary | ICD-10-CM | POA: Diagnosis not present

## 2016-09-16 DIAGNOSIS — K7040 Alcoholic hepatic failure without coma: Secondary | ICD-10-CM | POA: Diagnosis not present

## 2016-09-16 DIAGNOSIS — K219 Gastro-esophageal reflux disease without esophagitis: Secondary | ICD-10-CM | POA: Diagnosis not present

## 2016-09-16 DIAGNOSIS — K7031 Alcoholic cirrhosis of liver with ascites: Secondary | ICD-10-CM | POA: Diagnosis not present

## 2016-09-18 DIAGNOSIS — K219 Gastro-esophageal reflux disease without esophagitis: Secondary | ICD-10-CM | POA: Diagnosis not present

## 2016-09-18 DIAGNOSIS — K7040 Alcoholic hepatic failure without coma: Secondary | ICD-10-CM | POA: Diagnosis not present

## 2016-09-18 DIAGNOSIS — R630 Anorexia: Secondary | ICD-10-CM | POA: Diagnosis not present

## 2016-09-18 DIAGNOSIS — K7031 Alcoholic cirrhosis of liver with ascites: Secondary | ICD-10-CM | POA: Diagnosis not present

## 2016-09-18 DIAGNOSIS — G9341 Metabolic encephalopathy: Secondary | ICD-10-CM | POA: Diagnosis not present

## 2016-09-18 DIAGNOSIS — R188 Other ascites: Secondary | ICD-10-CM | POA: Diagnosis not present

## 2016-09-21 DIAGNOSIS — K7031 Alcoholic cirrhosis of liver with ascites: Secondary | ICD-10-CM | POA: Diagnosis not present

## 2016-09-21 DIAGNOSIS — G9341 Metabolic encephalopathy: Secondary | ICD-10-CM | POA: Diagnosis not present

## 2016-09-21 DIAGNOSIS — R630 Anorexia: Secondary | ICD-10-CM | POA: Diagnosis not present

## 2016-09-21 DIAGNOSIS — K219 Gastro-esophageal reflux disease without esophagitis: Secondary | ICD-10-CM | POA: Diagnosis not present

## 2016-09-21 DIAGNOSIS — R188 Other ascites: Secondary | ICD-10-CM | POA: Diagnosis not present

## 2016-09-21 DIAGNOSIS — K7040 Alcoholic hepatic failure without coma: Secondary | ICD-10-CM | POA: Diagnosis not present

## 2016-09-22 DIAGNOSIS — G9341 Metabolic encephalopathy: Secondary | ICD-10-CM | POA: Diagnosis not present

## 2016-09-22 DIAGNOSIS — K7040 Alcoholic hepatic failure without coma: Secondary | ICD-10-CM | POA: Diagnosis not present

## 2016-09-22 DIAGNOSIS — K219 Gastro-esophageal reflux disease without esophagitis: Secondary | ICD-10-CM | POA: Diagnosis not present

## 2016-09-22 DIAGNOSIS — R630 Anorexia: Secondary | ICD-10-CM | POA: Diagnosis not present

## 2016-09-22 DIAGNOSIS — R188 Other ascites: Secondary | ICD-10-CM | POA: Diagnosis not present

## 2016-09-22 DIAGNOSIS — K7031 Alcoholic cirrhosis of liver with ascites: Secondary | ICD-10-CM | POA: Diagnosis not present

## 2016-09-23 DIAGNOSIS — K7031 Alcoholic cirrhosis of liver with ascites: Secondary | ICD-10-CM | POA: Diagnosis not present

## 2016-09-23 DIAGNOSIS — G9341 Metabolic encephalopathy: Secondary | ICD-10-CM | POA: Diagnosis not present

## 2016-09-23 DIAGNOSIS — R188 Other ascites: Secondary | ICD-10-CM | POA: Diagnosis not present

## 2016-09-23 DIAGNOSIS — K219 Gastro-esophageal reflux disease without esophagitis: Secondary | ICD-10-CM | POA: Diagnosis not present

## 2016-09-23 DIAGNOSIS — R630 Anorexia: Secondary | ICD-10-CM | POA: Diagnosis not present

## 2016-09-23 DIAGNOSIS — K7040 Alcoholic hepatic failure without coma: Secondary | ICD-10-CM | POA: Diagnosis not present

## 2016-09-24 DIAGNOSIS — R630 Anorexia: Secondary | ICD-10-CM | POA: Diagnosis not present

## 2016-09-24 DIAGNOSIS — K219 Gastro-esophageal reflux disease without esophagitis: Secondary | ICD-10-CM | POA: Diagnosis not present

## 2016-09-24 DIAGNOSIS — K7040 Alcoholic hepatic failure without coma: Secondary | ICD-10-CM | POA: Diagnosis not present

## 2016-09-24 DIAGNOSIS — K7031 Alcoholic cirrhosis of liver with ascites: Secondary | ICD-10-CM | POA: Diagnosis not present

## 2016-09-24 DIAGNOSIS — R188 Other ascites: Secondary | ICD-10-CM | POA: Diagnosis not present

## 2016-09-24 DIAGNOSIS — G9341 Metabolic encephalopathy: Secondary | ICD-10-CM | POA: Diagnosis not present

## 2016-09-25 DIAGNOSIS — R630 Anorexia: Secondary | ICD-10-CM | POA: Diagnosis not present

## 2016-09-25 DIAGNOSIS — K7040 Alcoholic hepatic failure without coma: Secondary | ICD-10-CM | POA: Diagnosis not present

## 2016-09-25 DIAGNOSIS — G9341 Metabolic encephalopathy: Secondary | ICD-10-CM | POA: Diagnosis not present

## 2016-09-25 DIAGNOSIS — K7031 Alcoholic cirrhosis of liver with ascites: Secondary | ICD-10-CM | POA: Diagnosis not present

## 2016-09-25 DIAGNOSIS — R188 Other ascites: Secondary | ICD-10-CM | POA: Diagnosis not present

## 2016-09-25 DIAGNOSIS — K219 Gastro-esophageal reflux disease without esophagitis: Secondary | ICD-10-CM | POA: Diagnosis not present

## 2016-09-26 DIAGNOSIS — R188 Other ascites: Secondary | ICD-10-CM | POA: Diagnosis not present

## 2016-09-26 DIAGNOSIS — G9341 Metabolic encephalopathy: Secondary | ICD-10-CM | POA: Diagnosis not present

## 2016-09-26 DIAGNOSIS — K7040 Alcoholic hepatic failure without coma: Secondary | ICD-10-CM | POA: Diagnosis not present

## 2016-09-26 DIAGNOSIS — R630 Anorexia: Secondary | ICD-10-CM | POA: Diagnosis not present

## 2016-09-26 DIAGNOSIS — K219 Gastro-esophageal reflux disease without esophagitis: Secondary | ICD-10-CM | POA: Diagnosis not present

## 2016-09-26 DIAGNOSIS — K7031 Alcoholic cirrhosis of liver with ascites: Secondary | ICD-10-CM | POA: Diagnosis not present

## 2016-09-27 ENCOUNTER — Other Ambulatory Visit: Payer: Self-pay | Admitting: Cardiovascular Disease

## 2016-10-05 DEATH — deceased

## 2016-10-24 ENCOUNTER — Other Ambulatory Visit: Payer: Medicare Other

## 2016-10-24 ENCOUNTER — Ambulatory Visit: Payer: Medicare Other | Admitting: Oncology

## 2016-12-25 IMAGING — US US PARACENTESIS
1 series · 11 of 11 positions shown · non-contrast
Comparison: CT Abdomen/Pelvis 11/11/14.

MEDICATIONS:
None.

COMPLICATIONS:
None immediate

INDICATION: Ascites, request for diagnostic and therapeutic paracentesis.

EXAM:
ULTRASOUND-GUIDED PARACENTESIS
TECHNIQUE: Informed written consent was obtained from the patient after a
discussion of the risks, benefits and alternatives to treatment. A
timeout was performed prior to the initiation of the procedure.

[Series 1: us paracentesis · 0.18mm/px · 11 of 11 slices shown]
[im 1/11]
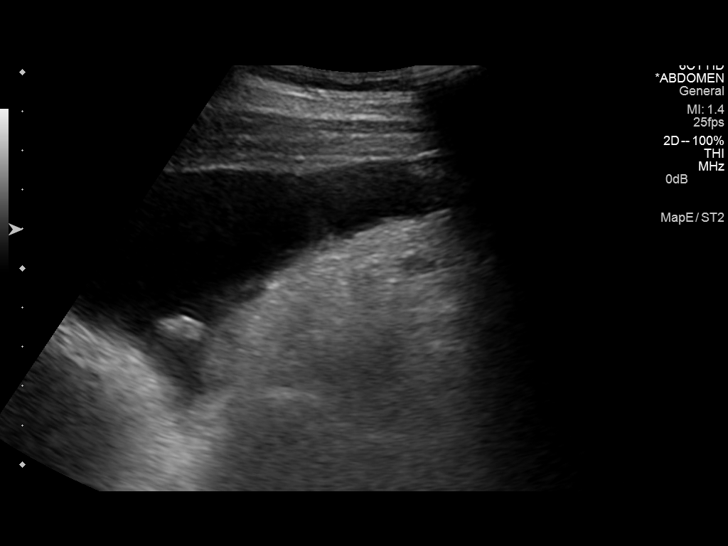
[im 2/11]
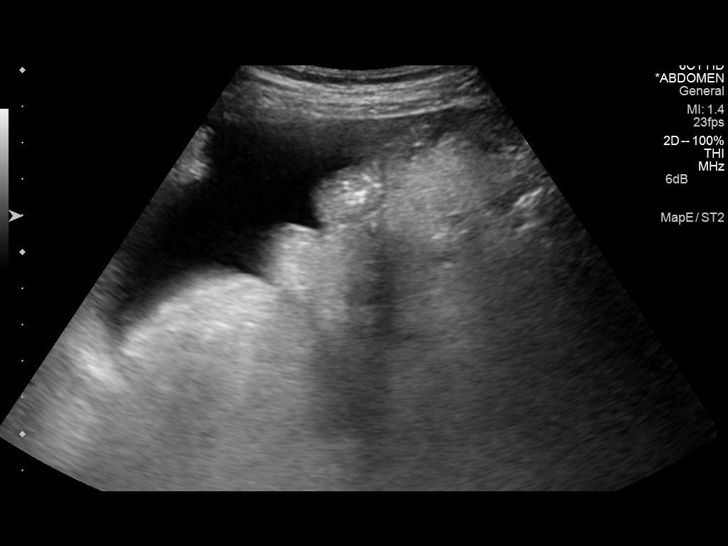
[im 3/11]
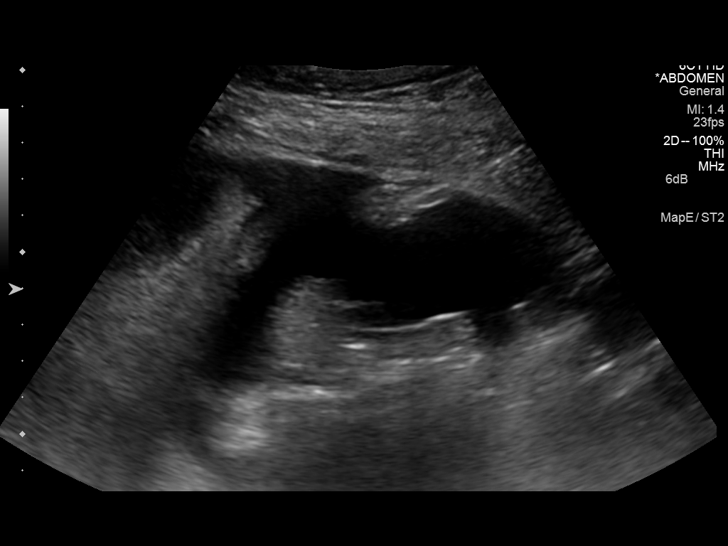
[im 4/11]
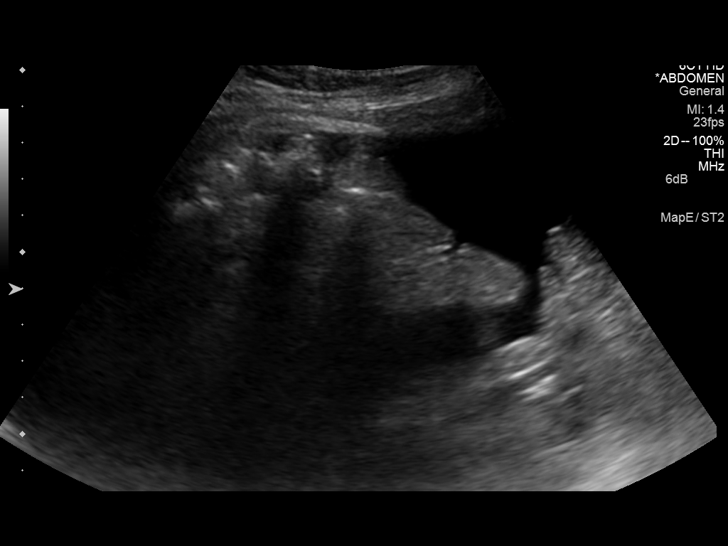
[im 5/11]
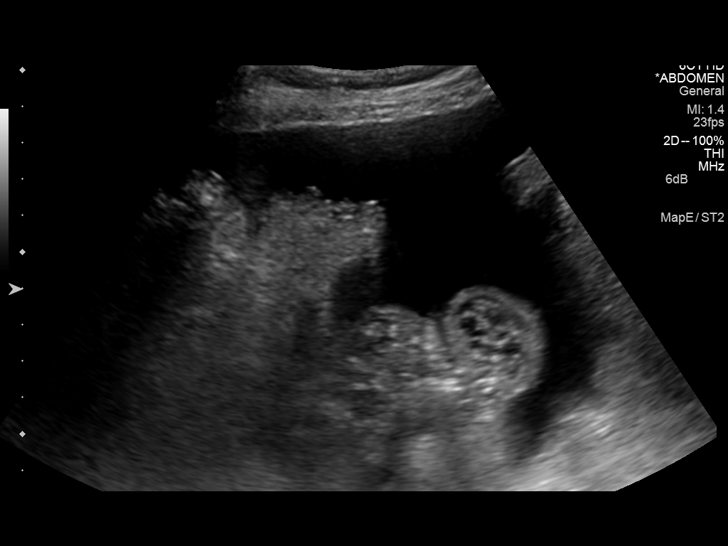
[im 6/11]
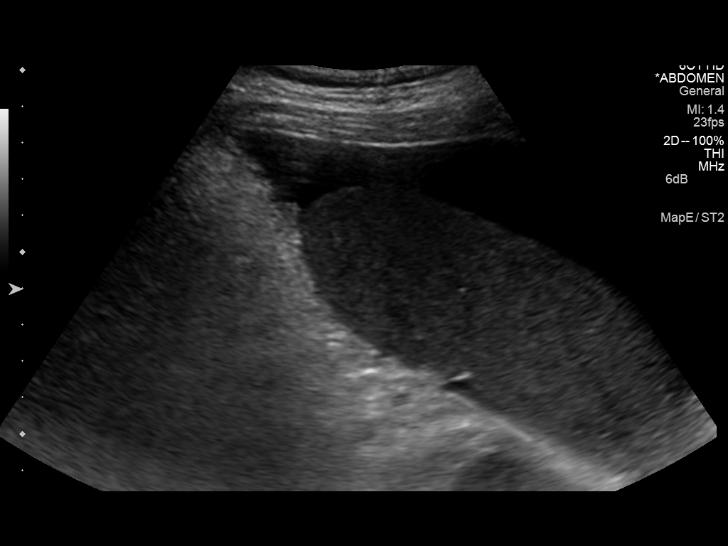
[im 7/11]
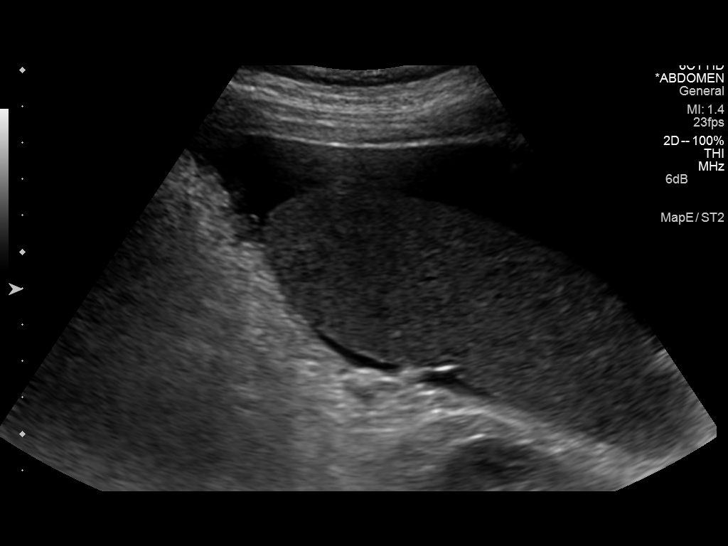
[im 8/11]
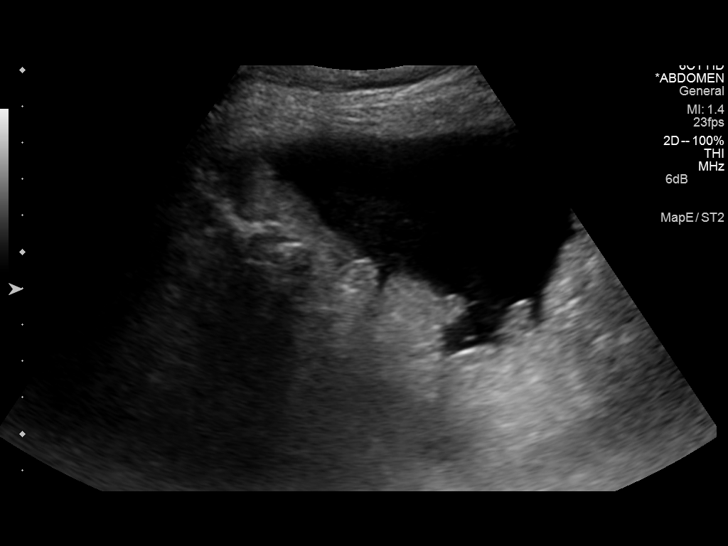
[im 9/11]
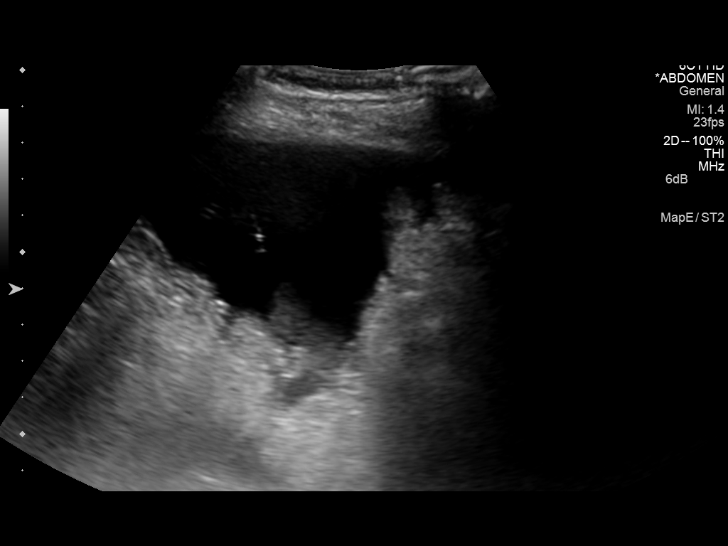
[im 10/11]
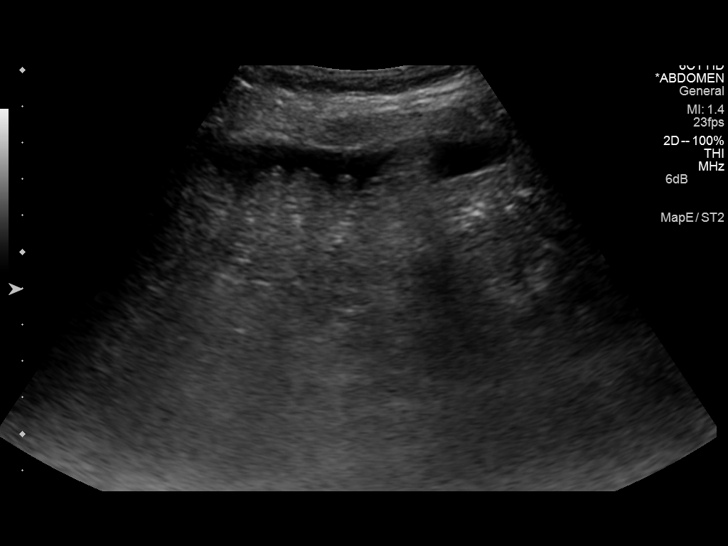
[im 11/11]
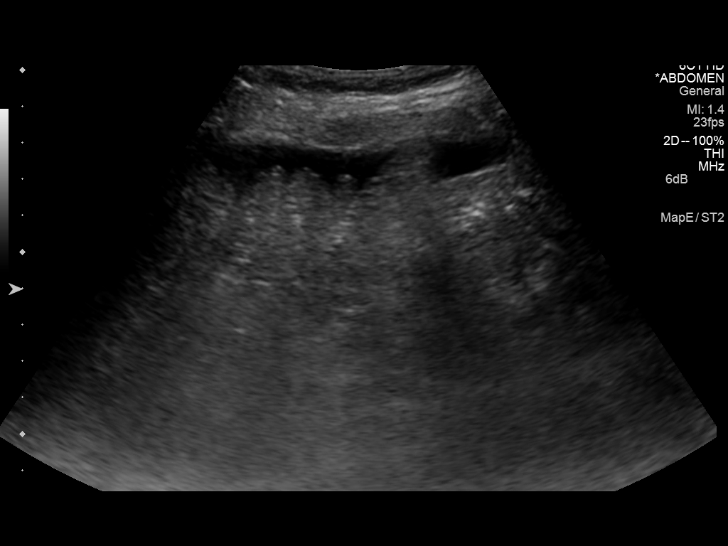

[11 of 11 positions shown; findings below may reference images not displayed]

Initial ultrasound scanning demonstrates a moderate amount of
ascites within the left lower abdominal quadrant. The left lower
abdomen was prepped and draped in the usual sterile fashion. 1%
lidocaine was used for local anesthesia. Under direct ultrasound
guidance, a 19 gauge, 7-cm, Yueh catheter was introduced. An
ultrasound image was saved for documentation purposed. The
paracentesis was performed. The catheter was removed and a dressing
was applied. The patient tolerated the procedure well without
immediate post procedural complication.
FINDINGS: A total of approximately 3 liters of serous fluid was removed.
Samples were sent to the laboratory as requested by the clinical
team.
IMPRESSION: Successful ultrasound-guided paracentesis yielding 3 liters of
peritoneal fluid.

## 2016-12-29 IMAGING — US US ABDOMEN LIMITED
1 series · 8 of 8 positions shown · non-contrast
Comparison: 11/19/2014

CLINICAL DATA: Ascites, past history of alcoholic hepatitis,
hypertension, atrial fibrillation, former smoker

EXAM:
LIMITED ABDOMEN ULTRASOUND FOR ASCITES
TECHNIQUE: Limited ultrasound survey for ascites was performed in all four
abdominal quadrants.

[Series 1: us abdomen limited · 0.27mm/px · 8 of 8 slices shown]
[im 1/8]
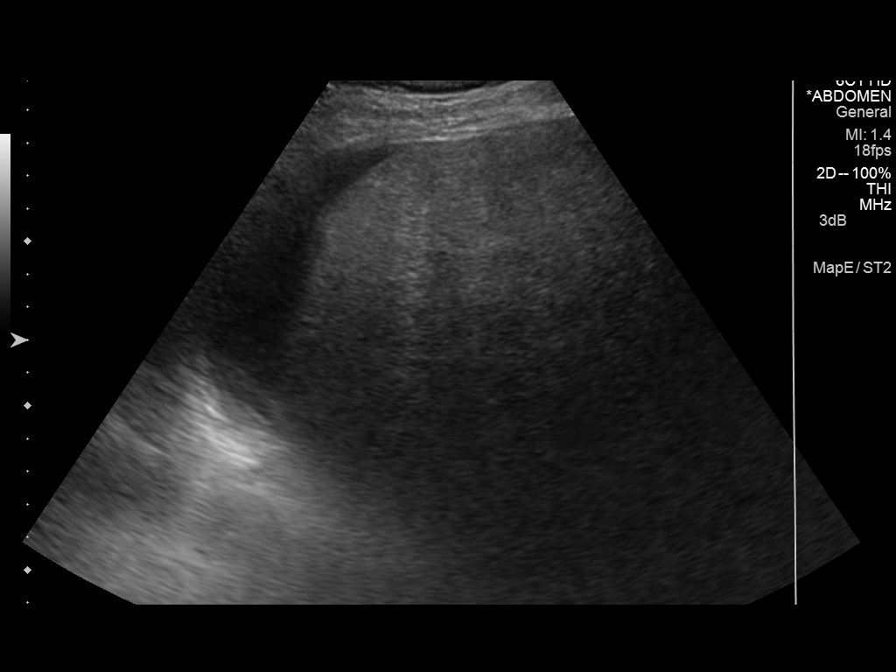
[im 2/8]
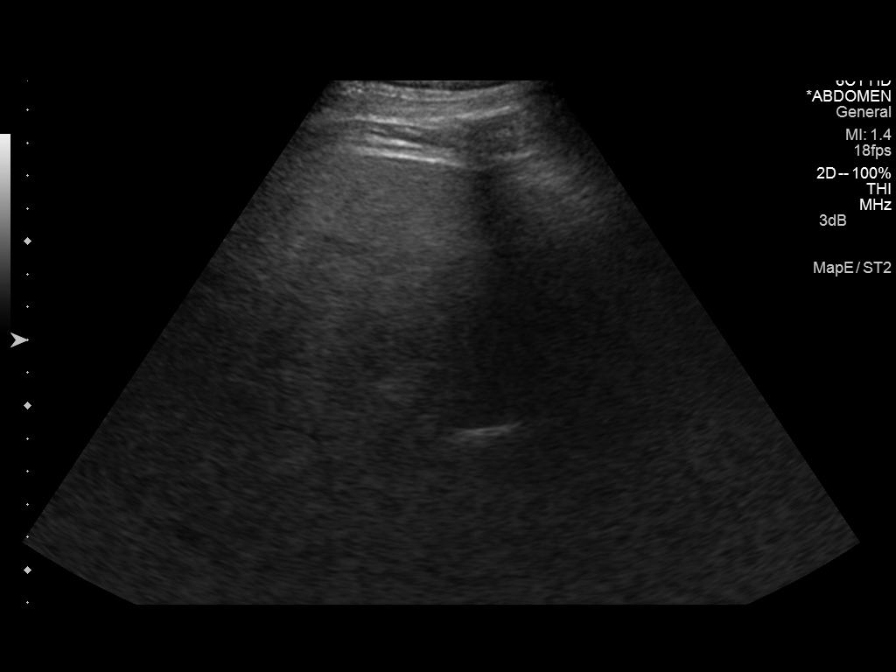
[im 3/8]
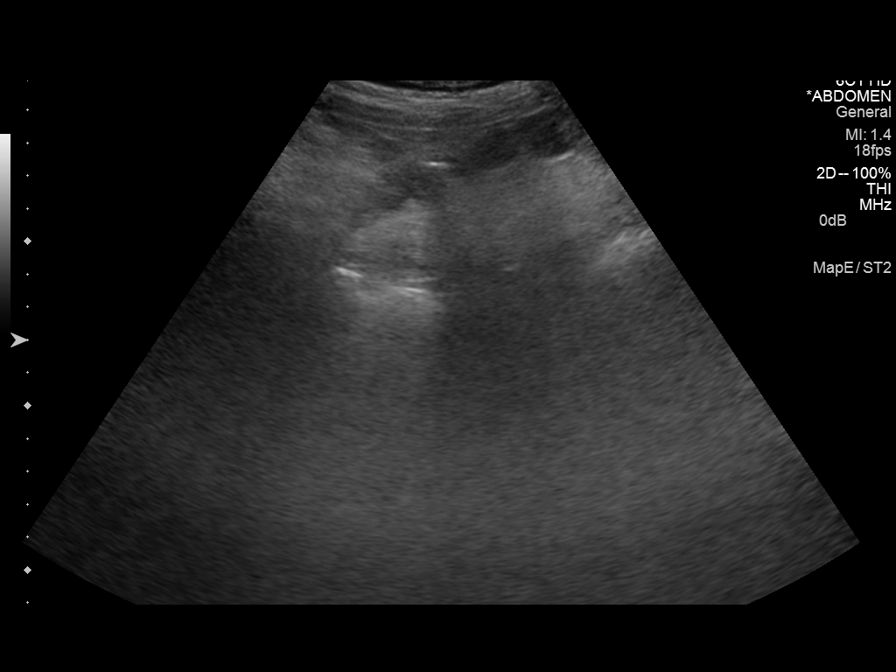
[im 4/8]
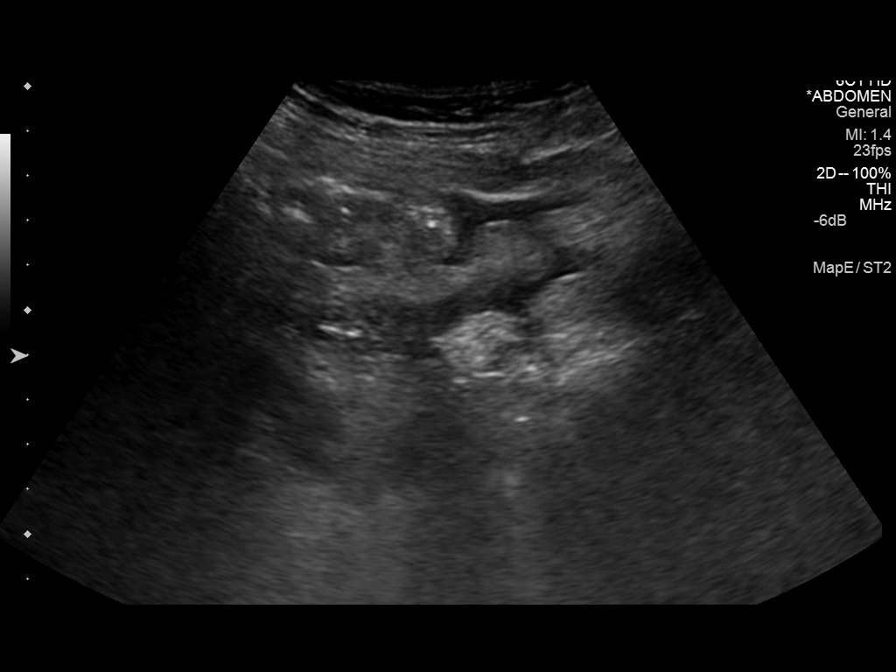
[im 5/8]
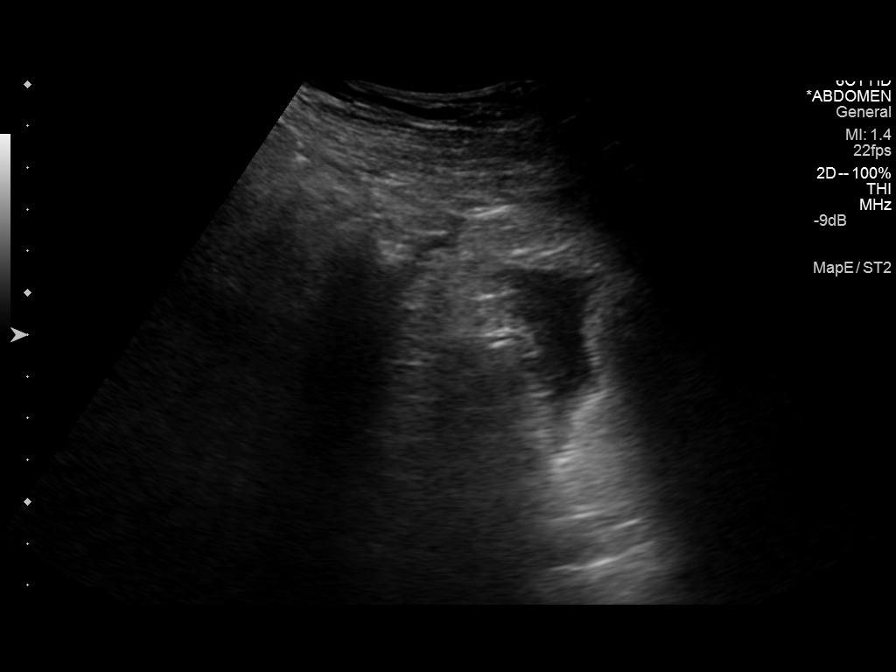
[im 6/8]
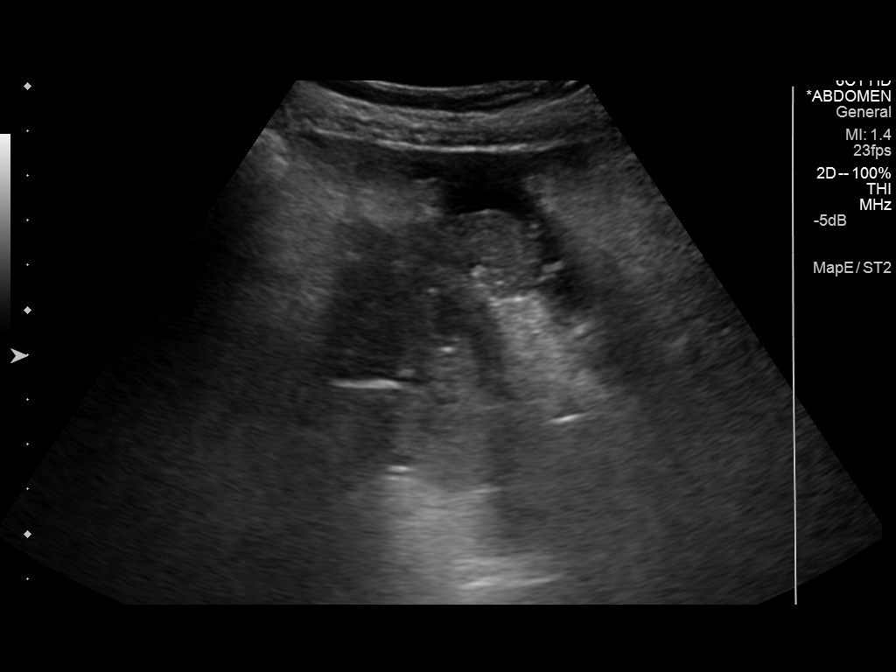
[im 7/8]
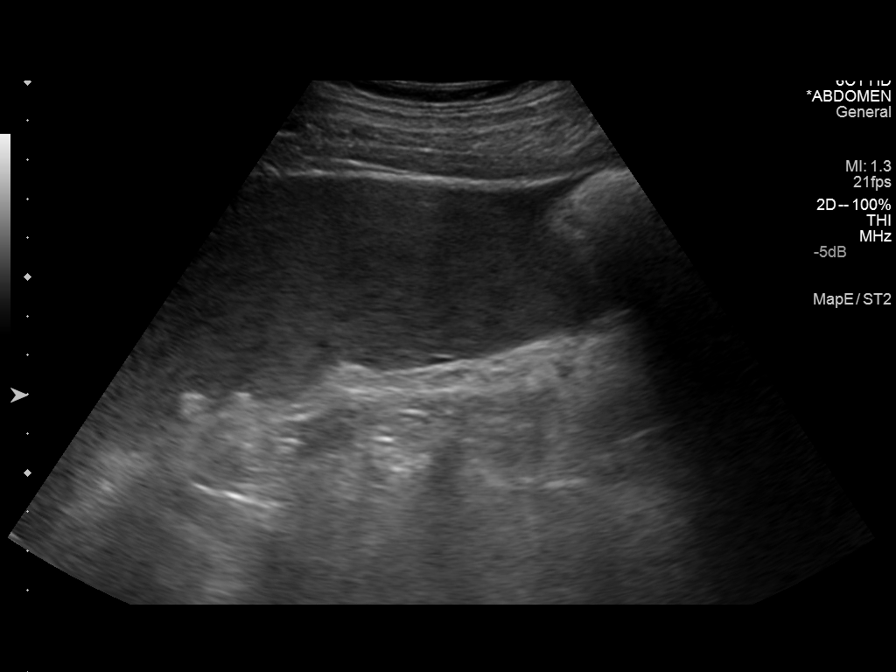
[im 8/8]
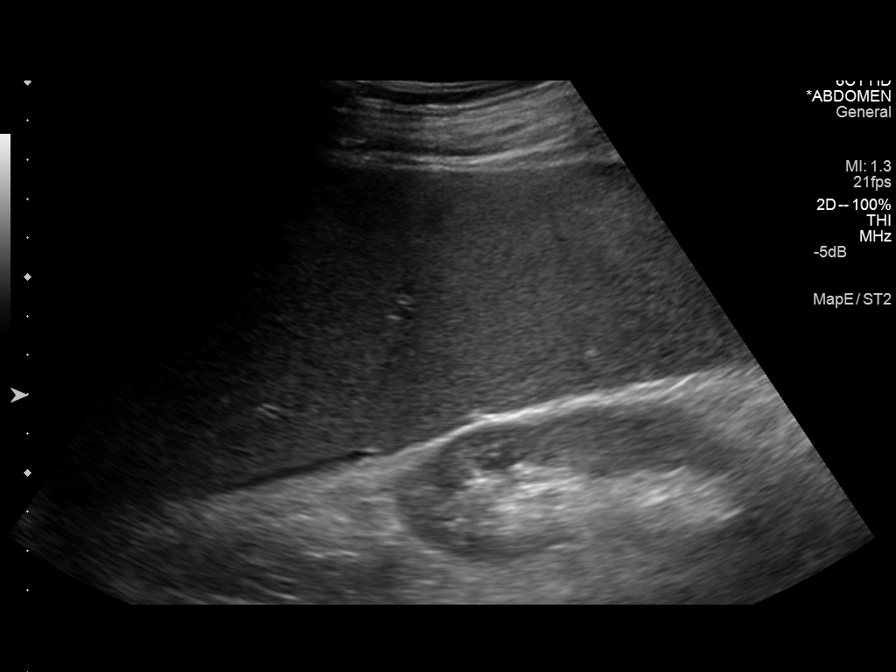

[8 of 8 positions shown; findings below may reference images not displayed]

FINDINGS: Minimal ascites identified in the lower quadrants.

Volume of ascites is insufficient for paracentesis and represents a
marked decrease since the previous exam.
IMPRESSION: Minimal ascites, insufficient for paracentesis.

## 2017-01-08 IMAGING — CR DG RIBS W/ CHEST 3+V*R*
5 series · 5 of 5 positions shown · non-contrast
Comparison: No prior rib imaging. Chest x-ray 11/22/2014 and
earlier. Whole-body bone scan 08/14/2012, 02/19/2012.

CLINICAL DATA: Fell at the [HOSPITAL] wall transferring from bed
to wheelchair, striking the back of the head. Patient has fallen 3-4
times in the past week. Mid to lower right chest/rib pain. Initial
encounter.

EXAM:
RIGHT RIBS AND CHEST - 3+ VIEW

[t ribs ap lower right (1 of 2)]
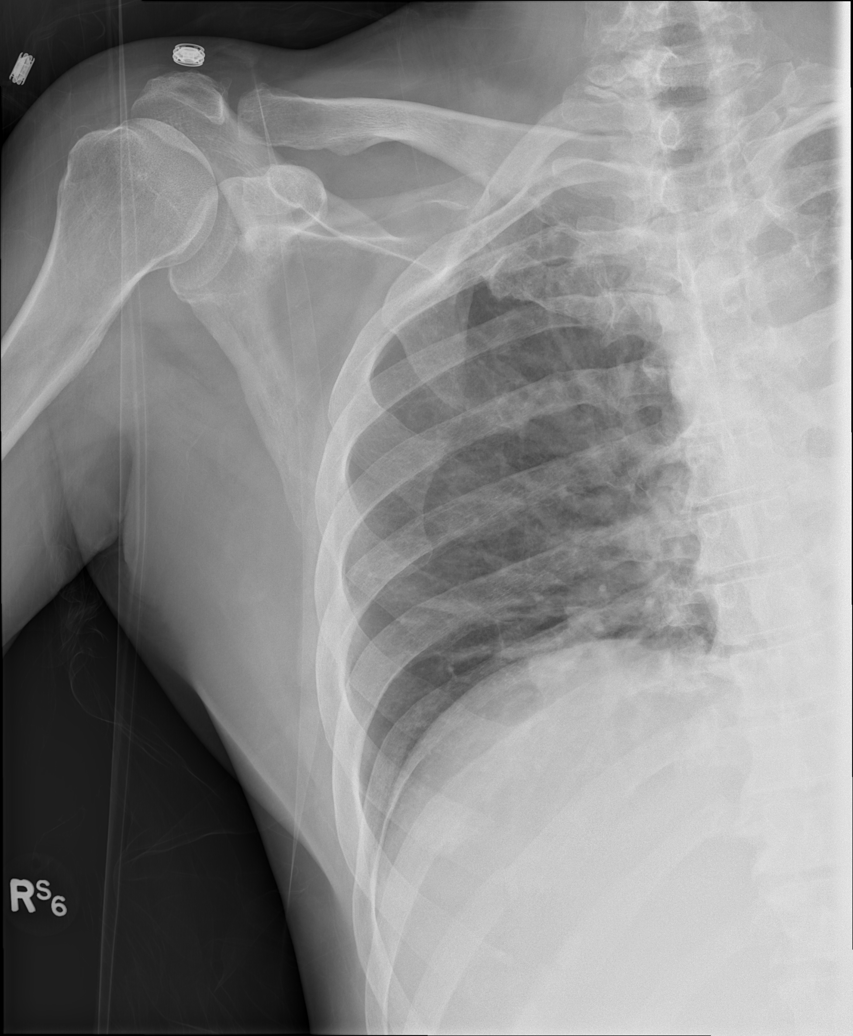

[t ribs ap lower right (2 of 2)]
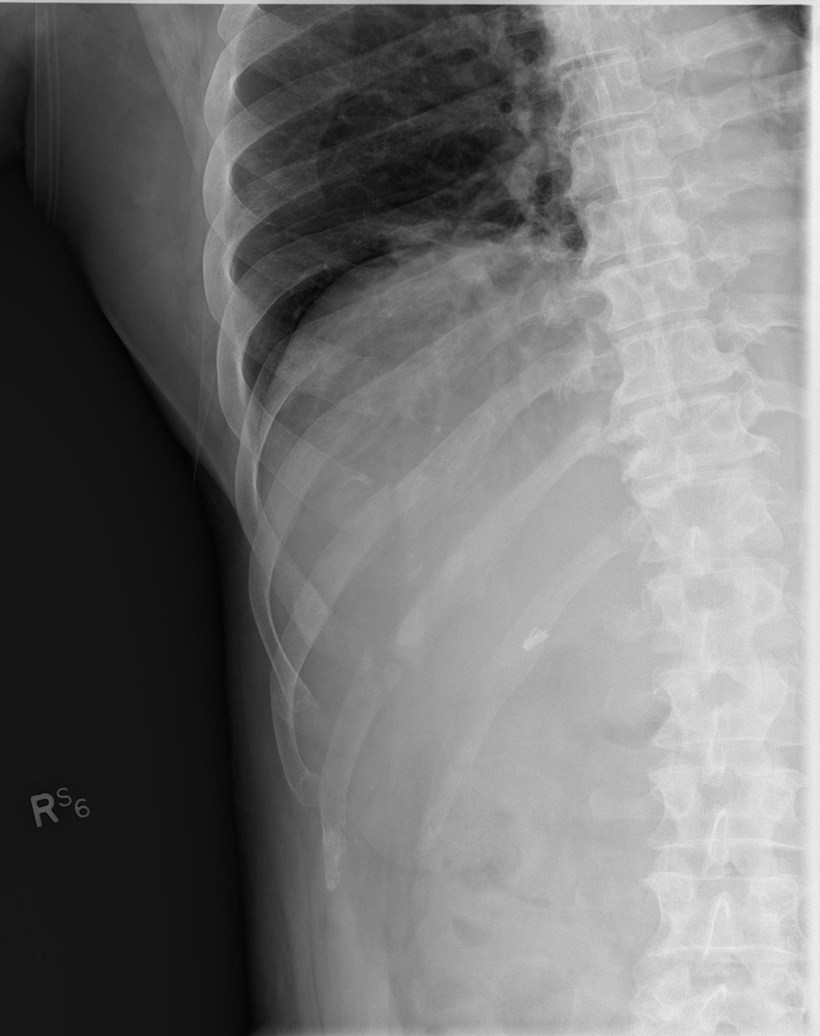

[t ribs ap upper right]
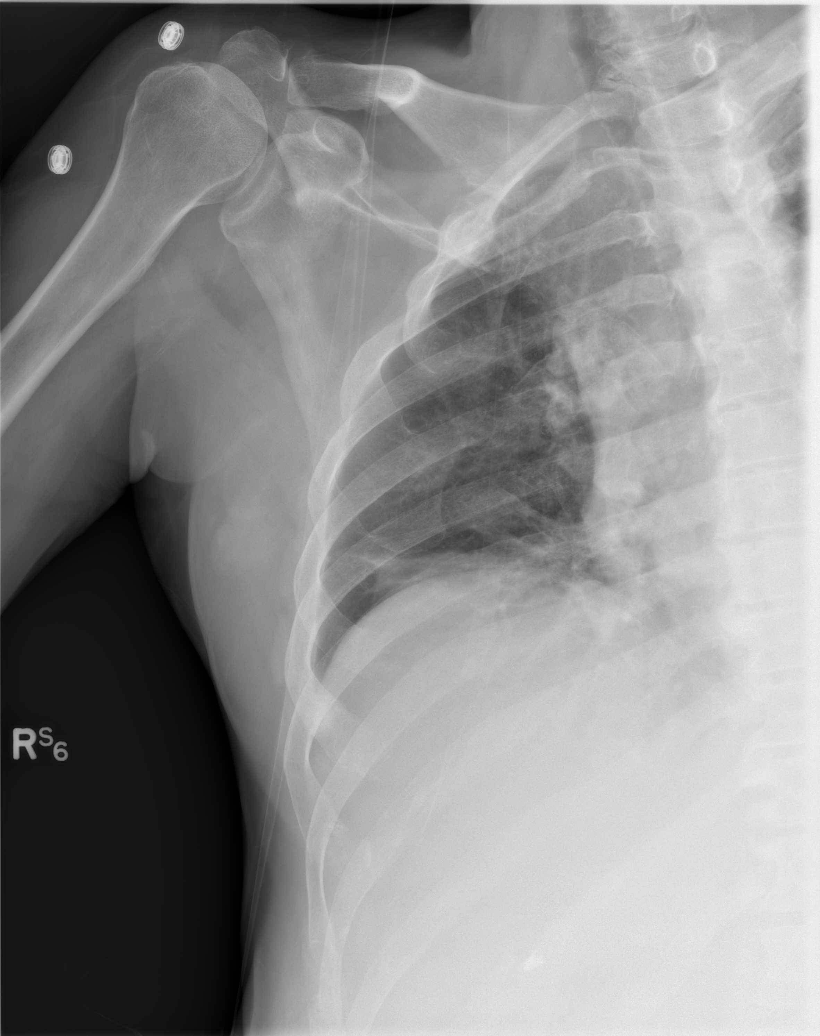

[t ribs rpo right]
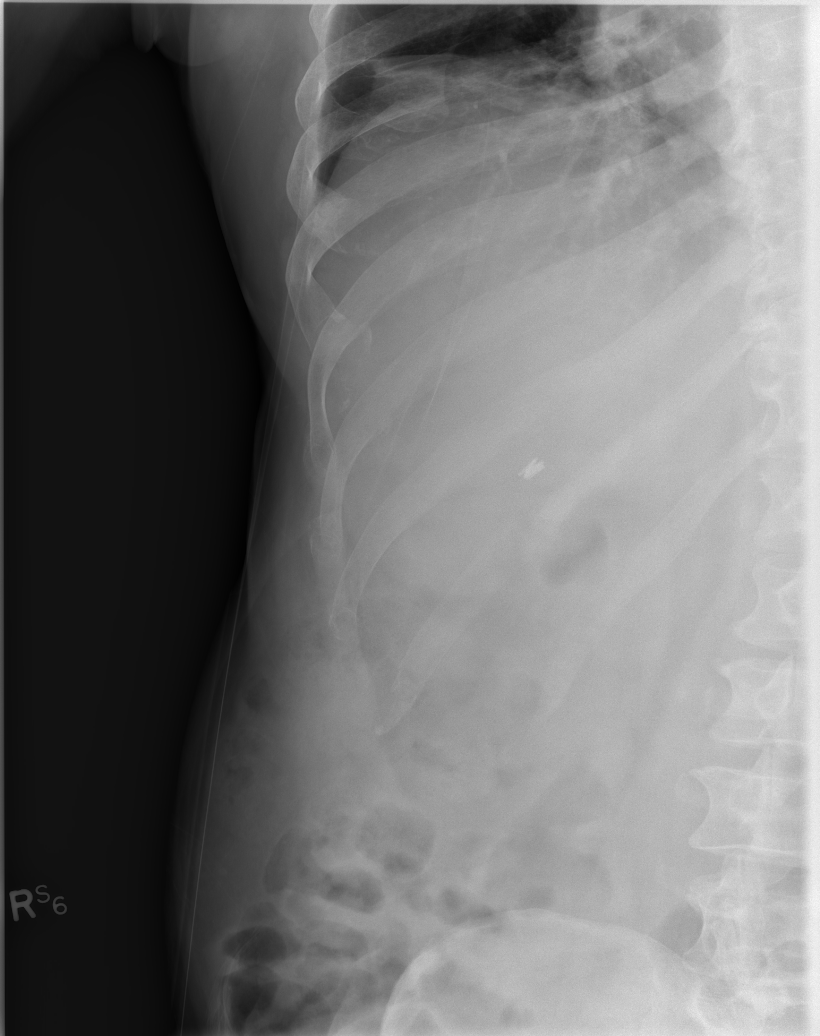

[x chest ap]
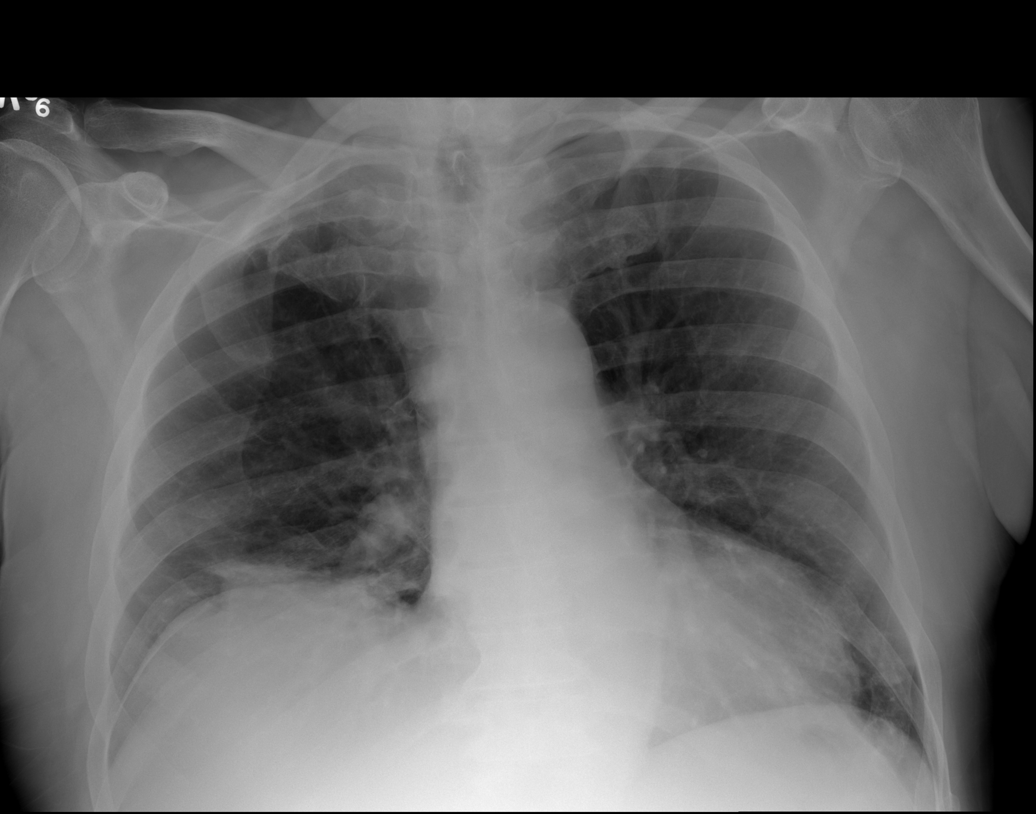

[5 of 5 positions shown; findings below may reference images not displayed]

FINDINGS: Comminuted, mildly displaced fractures involving the right posterior
tenth and eleventh ribs. Nondisplaced fracture involving the
posterolateral eleventh rib.

Atelectasis involving the right lower lobe, superimposed on chronic
elevation of the right hemidiaphragm. Lungs otherwise clear. No
localized airspace consolidation. No pleural effusions. No
pneumothorax. Normal pulmonary vascularity. Cardiac silhouette
mildly enlarged for AP technique, unchanged.
IMPRESSION: 1. Acute traumatic fractures involving the right posterior tenth,
eleventh and twelfth ribs. The tenth and eleventh rib fractures are
mildly displaced, the 12 rib fracture is nondisplaced.
2. Right lower lobe atelectasis. No acute cardiopulmonary disease
otherwise. Stable mild cardiomegaly.
